# Patient Record
Sex: Male | Born: 1960 | Race: White | Hispanic: No | Marital: Married | Smoking: Former smoker
Health system: Southern US, Community
[De-identification: ages and names within clinical notes are randomized; demographics above are authoritative.]

## PROBLEM LIST (undated history)

## (undated) DIAGNOSIS — K219 Gastro-esophageal reflux disease without esophagitis: Secondary | ICD-10-CM

## (undated) DIAGNOSIS — R569 Unspecified convulsions: Secondary | ICD-10-CM

## (undated) DIAGNOSIS — E039 Hypothyroidism, unspecified: Secondary | ICD-10-CM

---

## 2014-01-18 ENCOUNTER — Emergency Department (HOSPITAL_COMMUNITY): Payer: Self-pay

## 2014-01-18 ENCOUNTER — Encounter (HOSPITAL_COMMUNITY): Payer: Self-pay | Admitting: Emergency Medicine

## 2014-01-18 ENCOUNTER — Emergency Department (HOSPITAL_COMMUNITY)
Admission: EM | Admit: 2014-01-18 | Discharge: 2014-01-19 | Disposition: A | Discharge status: Home / Self Care | Payer: Self-pay | Attending: Emergency Medicine | Admitting: Emergency Medicine

## 2014-01-18 DIAGNOSIS — Z79899 Other long term (current) drug therapy: Secondary | ICD-10-CM | POA: Insufficient documentation

## 2014-01-18 DIAGNOSIS — K5732 Diverticulitis of large intestine without perforation or abscess without bleeding: Secondary | ICD-10-CM | POA: Insufficient documentation

## 2014-01-18 DIAGNOSIS — F17200 Nicotine dependence, unspecified, uncomplicated: Secondary | ICD-10-CM | POA: Insufficient documentation

## 2014-01-18 DIAGNOSIS — K5792 Diverticulitis of intestine, part unspecified, without perforation or abscess without bleeding: Secondary | ICD-10-CM

## 2014-01-18 LAB — CBC WITH DIFFERENTIAL/PLATELET
BASOS ABS: 0 10*3/uL (ref 0.0–0.1)
BASOS PCT: 0 % (ref 0–1)
EOS ABS: 0 10*3/uL (ref 0.0–0.7)
EOS PCT: 0 % (ref 0–5)
HEMATOCRIT: 44.6 % (ref 39.0–52.0)
Hemoglobin: 16.3 g/dL (ref 13.0–17.0)
Lymphocytes Relative: 7 % — ABNORMAL LOW (ref 12–46)
Lymphs Abs: 1 10*3/uL (ref 0.7–4.0)
MCH: 33.4 pg (ref 26.0–34.0)
MCHC: 36.5 g/dL — ABNORMAL HIGH (ref 30.0–36.0)
MCV: 91.4 fL (ref 78.0–100.0)
MONO ABS: 1.3 10*3/uL — AB (ref 0.1–1.0)
Monocytes Relative: 8 % (ref 3–12)
NEUTROS ABS: 13.2 10*3/uL — AB (ref 1.7–7.7)
Neutrophils Relative %: 85 % — ABNORMAL HIGH (ref 43–77)
Platelets: 411 10*3/uL — ABNORMAL HIGH (ref 150–400)
RBC: 4.88 MIL/uL (ref 4.22–5.81)
RDW: 12.2 % (ref 11.5–15.5)
WBC: 15.6 10*3/uL — ABNORMAL HIGH (ref 4.0–10.5)

## 2014-01-18 LAB — URINALYSIS, ROUTINE W REFLEX MICROSCOPIC
Bilirubin Urine: NEGATIVE
Glucose, UA: NEGATIVE mg/dL
Hgb urine dipstick: NEGATIVE
KETONES UR: NEGATIVE mg/dL
LEUKOCYTES UA: NEGATIVE
Nitrite: NEGATIVE
Protein, ur: NEGATIVE mg/dL
Specific Gravity, Urine: 1.02 (ref 1.005–1.030)
Urobilinogen, UA: 0.2 mg/dL (ref 0.0–1.0)
pH: 6 (ref 5.0–8.0)

## 2014-01-18 LAB — BASIC METABOLIC PANEL
BUN: 12 mg/dL (ref 6–23)
CALCIUM: 9.3 mg/dL (ref 8.4–10.5)
CHLORIDE: 98 meq/L (ref 96–112)
CO2: 23 mEq/L (ref 19–32)
CREATININE: 0.71 mg/dL (ref 0.50–1.35)
GFR calc non Af Amer: >90 mL/min (ref >90)
Glucose, Bld: 109 mg/dL — ABNORMAL HIGH (ref 70–99)
Potassium: 3.5 mEq/L — ABNORMAL LOW (ref 3.7–5.3)
Sodium: 135 mEq/L — ABNORMAL LOW (ref 137–147)

## 2014-01-18 MED ORDER — IOHEXOL 300 MG/ML  SOLN
50.0000 mL | Freq: Once | INTRAMUSCULAR | Status: AC | PRN
  Administered 2014-01-18: 50 mL via ORAL

## 2014-01-18 MED ORDER — CIPROFLOXACIN HCL 750 MG PO TABS: 750.0000 mg | ORAL_TABLET | Freq: Two times a day (BID) | ORAL | Status: DC

## 2014-01-18 MED ORDER — ONDANSETRON HCL 4 MG/2ML IJ SOLN
4.0000 mg | Freq: Once | INTRAMUSCULAR | Status: AC
Start: 2014-01-18 — End: 2014-01-18
  Administered 2014-01-18: 4 mg via INTRAVENOUS
  Filled 2014-01-18: qty 2

## 2014-01-18 MED ORDER — PROMETHAZINE HCL 25 MG PO TABS: 25.0000 mg | ORAL_TABLET | Freq: Four times a day (QID) | ORAL | Status: DC | PRN

## 2014-01-18 MED ORDER — MORPHINE SULFATE 4 MG/ML IJ SOLN
4.0000 mg | Freq: Once | INTRAMUSCULAR | Status: AC
  Administered 2014-01-18: 4 mg via INTRAVENOUS
  Filled 2014-01-18: qty 1

## 2014-01-18 MED ORDER — SODIUM CHLORIDE 0.9 % IV BOLUS (SEPSIS)
1000.0000 mL | Freq: Once | INTRAVENOUS | Status: AC
  Administered 2014-01-18: 1000 mL via INTRAVENOUS

## 2014-01-18 MED ORDER — METRONIDAZOLE 500 MG PO TABS: 500.0000 mg | ORAL_TABLET | Freq: Four times a day (QID) | ORAL | Status: DC

## 2014-01-18 MED ORDER — KETOROLAC TROMETHAMINE 30 MG/ML IJ SOLN
30.0000 mg | Freq: Once | INTRAMUSCULAR | Status: AC
  Administered 2014-01-18: 30 mg via INTRAVENOUS
  Filled 2014-01-18: qty 1

## 2014-01-18 MED ORDER — CIPROFLOXACIN IN D5W 400 MG/200ML IV SOLN
400.0000 mg | Freq: Once | INTRAVENOUS | Status: AC
  Administered 2014-01-18: 400 mg via INTRAVENOUS
  Filled 2014-01-18: qty 200

## 2014-01-18 MED ORDER — IOHEXOL 300 MG/ML  SOLN
100.0000 mL | Freq: Once | INTRAMUSCULAR | Status: AC | PRN
  Administered 2014-01-18: 100 mL via INTRAVENOUS

## 2014-01-18 MED ORDER — METRONIDAZOLE IN NACL 5-0.79 MG/ML-% IV SOLN
500.0000 mg | Freq: Once | INTRAVENOUS | Status: AC
  Administered 2014-01-18: 500 mg via INTRAVENOUS
  Filled 2014-01-18: qty 100

## 2014-01-18 MED ORDER — OXYCODONE-ACETAMINOPHEN 5-325 MG PO TABS: 2.0000 | ORAL_TABLET | ORAL | Status: DC | PRN

## 2014-01-18 NOTE — ED Provider Notes (Signed)
CSN: [id number]     Arrival date & time 01/18/14  1649 History   This chart was scribed for Ian HutchingBrian Arneisha Kincannon, MD by Manuela Schwartzaylor Day, ED scribe. This patient was seen in room APA08/APA08 and the patient's care was started at 1649.  Chief Complaint  Patient presents with  . Abdominal Pain   The history is provided by the patient. No language interpreter was used.   HPI Comments: Smitty CordsRoger Allen Maynes is a 53 y.o. male who presents to the Emergency Department complaining of aching constant, bilateral lower abdominal pain left worse than right; radiating to his left flank; he also c/o lower back pain. His sister reports that today he looked ill and had pallor. He reports dysuria but denies hematuria. He reports normal BMs recently. He denies any hx of abdominal surgeries. Severity is moderate. No previous history of diverticulitis  Denies hx of DM or HTN He occasionally takes tylenol and vatiamin C.  He works as a Curatormechanic He is a smoker. Quit drinking alcohol about 2 months ago.   History reviewed. No pertinent past medical history. History reviewed. No pertinent past surgical history. No family history on file. History  Substance Use Topics  . Smoking status: Never Smoker   . Smokeless tobacco: Not on file  . Alcohol Use: Yes     Comment: last drank etoh 1 1/2 months ago.    Review of Systems  Constitutional: Negative for fever and chills.  Respiratory: Negative for cough and shortness of breath.   Cardiovascular: Negative for chest pain.  Gastrointestinal: Positive for abdominal pain. Negative for nausea, vomiting and diarrhea.  Musculoskeletal: Positive for back pain.  All other systems reviewed and are negative.   Allergies  Review of patient's allergies indicates no known allergies.  Home Medications   Current Outpatient Rx  Name  Route  Sig  Dispense  Refill  . acetaminophen (TYLENOL) 500 MG tablet   Oral   Take 1,000 mg by mouth every 6 (six) hours as needed for moderate pain.          . Ascorbic Acid (VITAMIN C PO)   Oral   Take 1 tablet by mouth daily.         . ciprofloxacin (CIPRO) 750 MG tablet   Oral   Take 1 tablet (750 mg total) by mouth 2 (two) times daily.   20 tablet   0   . metroNIDAZOLE (FLAGYL) 500 MG tablet   Oral   Take 1 tablet (500 mg total) by mouth 4 (four) times daily.   40 tablet   0   . oxyCODONE-acetaminophen (PERCOCET) 5-325 MG per tablet   Oral   Take 2 tablets by mouth every 4 (four) hours as needed.   20 tablet   0   . promethazine (PHENERGAN) 25 MG tablet   Oral   Take 1 tablet (25 mg total) by mouth every 6 (six) hours as needed for nausea or vomiting.   20 tablet   0    BP 121/48  Pulse 81  Temp(Src) 97.8 F (36.6 C) (Oral)  Resp 18  Ht 6' (1.829 m)  Wt 155 lb (70.308 kg)  BMI 21.02 kg/m2  SpO2 97%  Physical Exam  Nursing note and vitals reviewed. Constitutional: He is oriented to person, place, and time. He appears well-developed and well-nourished.  HENT:  Head: Normocephalic and atraumatic.  Eyes: Conjunctivae and EOM are normal. Pupils are equal, round, and reactive to light.  Neck: Normal range of motion. Neck  supple.  Cardiovascular: Normal rate, regular rhythm and normal heart sounds.   Pulmonary/Chest: Effort normal and breath sounds normal.  Abdominal: Soft. Bowel sounds are normal. He exhibits no distension. There is tenderness. There is no rebound and no guarding.  LLQ worse than RLQ tenderness to palpation     Musculoskeletal: Normal range of motion.  Neurological: He is alert and oriented to person, place, and time.  Skin: Skin is warm and dry.  Psychiatric: He has a normal mood and affect. His behavior is normal.   ED Course  Procedures (including critical care time) DIAGNOSTIC STUDIES: Oxygen Saturation is 97% on room air, normal by my interpretation.    COORDINATION OF CARE: At 630 PM Discussed treatment plan with patient which includes UA, blood work. Patient agrees.   Labs  Review Labs Reviewed  CBC WITH DIFFERENTIAL - Abnormal; Notable for the following:    WBC 15.6 (*)    MCHC 36.5 (*)    Platelets 411 (*)    Neutrophils Relative % 85 (*)    Neutro Abs 13.2 (*)    Lymphocytes Relative 7 (*)    Monocytes Absolute 1.3 (*)    All other components within normal limits  BASIC METABOLIC PANEL - Abnormal; Notable for the following:    Sodium 135 (*)    Potassium 3.5 (*)    Glucose, Bld 109 (*)    All other components within normal limits  URINALYSIS, ROUTINE W REFLEX MICROSCOPIC   Imaging Review No results found.  EKG Interpretation   None      MDM   Final diagnoses:  Diverticulitis    CT scan reveals a proximal sigmoid diverticulitis. No obstruction or abscess. Discussed both inpatient and outpatient options. Patient opted to try outpatient therapy. We'll discharge home with Cipro and Flagyl along with pain and nausea medication. He understands to return if worse.    I personally performed the services described in this documentation, which was scribed in my presence. The recorded information has been reviewed and is accurate.      Ian Hutching, MD 01/22/14 2111

## 2014-01-18 NOTE — ED Notes (Signed)
Pt c/o lower abd pain x 1 month.  Reports pain got worse today and has been nauseated.  Denies vomiting or diarrhea.  LBM was this am and was normal.  Pt says burns after urinating.

## 2014-01-18 NOTE — Discharge Instructions (Signed)
Diverticulitis Small pockets or "bubbles" can develop in the wall of the intestine. Diverticulitis is when those pockets become infected and inflamed. This causes stomach pain (usually on the left side). HOME CARE  Take all medicine as told by your doctor.  Try a clear liquid diet (broth, tea, or water) for as long as told by your doctor.  Keep all follow-up visits with your doctor.  You may be put on a low-fiber diet once you start feeling better. Here are foods that have low-fiber:  White breads, cereals, rice, and pasta.  Cooked fruits and vegetables or soft fresh fruits and vegetables without the skin.  Ground or well-cooked tender beef, ham, veal, lamb, pork, or poultry.  Eggs and seafood.  After you are doing well on the low-fiber diet, you may be put on a high-fiber diet. Here are ways to increase your fiber:  Choose whole-grain breads, cereals, pasta, and brown rice.  Choose fruits and vegetables with skin on. Do not overcook the vegetables.  Choose nuts, seeds, legumes, dried peas, beans, and lentils.  Look for food products that have more than 3 grams of fiber per serving on the food label. GET HELP RIGHT AWAY IF:  Your pain does not get better or gets worse.  You have trouble eating food.  You are not pooping (having bowel movements) like normal.  You have a temperature by mouth above 102 F (38.9 C), not controlled by medicine.  You keep throwing up (vomiting).  You have bloody or black, tarry poop (stools).  You are getting worse and not better. MAKE SURE YOU:   Understand these instructions.  Will watch your condition.  Will get help right away if you are not doing well or get worse. Document Released: 05/05/2008 Document Revised: 02/09/2012 Document Reviewed: 10/08/2009 [facility] Patient Information 2014 [employer] [place].   Clear liquids for next 24 hours. Then recommend nongreasy non-spicy foods for next 24 hours. Medication for pain and nausea. 2  antibiotics. One twice a day and one 4 times a day. Return you're getting worse in any way.

## 2014-11-28 ENCOUNTER — Emergency Department (HOSPITAL_COMMUNITY): Payer: Self-pay

## 2014-11-28 ENCOUNTER — Encounter (HOSPITAL_COMMUNITY)
Admission: EM | Disposition: A | Discharge status: Home / Self Care | Payer: Self-pay | Source: Home / Self Care | Attending: Emergency Medicine

## 2014-11-28 ENCOUNTER — Ambulatory Visit (HOSPITAL_COMMUNITY)
Admission: EM | Admit: 2014-11-28 | Discharge: 2014-11-29 | Disposition: A | Discharge status: Home / Self Care | Payer: Self-pay | Attending: Emergency Medicine | Admitting: Emergency Medicine

## 2014-11-28 ENCOUNTER — Emergency Department (HOSPITAL_COMMUNITY): Payer: Self-pay | Admitting: Certified Registered"

## 2014-11-28 ENCOUNTER — Encounter (HOSPITAL_COMMUNITY): Payer: Self-pay | Admitting: *Deleted

## 2014-11-28 DIAGNOSIS — Y929 Unspecified place or not applicable: Secondary | ICD-10-CM | POA: Insufficient documentation

## 2014-11-28 DIAGNOSIS — S62522B Displaced fracture of distal phalanx of left thumb, initial encounter for open fracture: Secondary | ICD-10-CM | POA: Insufficient documentation

## 2014-11-28 DIAGNOSIS — T1490XA Injury, unspecified, initial encounter: Secondary | ICD-10-CM

## 2014-11-28 DIAGNOSIS — S6702XA Crushing injury of left thumb, initial encounter: Secondary | ICD-10-CM | POA: Insufficient documentation

## 2014-11-28 DIAGNOSIS — F17210 Nicotine dependence, cigarettes, uncomplicated: Secondary | ICD-10-CM | POA: Insufficient documentation

## 2014-11-28 DIAGNOSIS — W3189XA Contact with other specified machinery, initial encounter: Secondary | ICD-10-CM | POA: Insufficient documentation

## 2014-11-28 DIAGNOSIS — S6992XA Unspecified injury of left wrist, hand and finger(s), initial encounter: Secondary | ICD-10-CM

## 2014-11-28 HISTORY — PX: I&D EXTREMITY: SHX5045

## 2014-11-28 LAB — CBC WITH DIFFERENTIAL/PLATELET
BASOS ABS: 0.1 10*3/uL (ref 0.0–0.1)
BASOS PCT: 1 % (ref 0–1)
Eosinophils Absolute: 0.2 10*3/uL (ref 0.0–0.7)
Eosinophils Relative: 1 % (ref 0–5)
HCT: 44.3 % (ref 39.0–52.0)
Hemoglobin: 15.8 g/dL (ref 13.0–17.0)
LYMPHS PCT: 19 % (ref 12–46)
Lymphs Abs: 2.4 10*3/uL (ref 0.7–4.0)
MCH: 31.5 pg (ref 26.0–34.0)
MCHC: 35.7 g/dL (ref 30.0–36.0)
MCV: 88.2 fL (ref 78.0–100.0)
Monocytes Absolute: 1.2 10*3/uL — ABNORMAL HIGH (ref 0.1–1.0)
Monocytes Relative: 10 % (ref 3–12)
NEUTROS ABS: 8.5 10*3/uL — AB (ref 1.7–7.7)
Neutrophils Relative %: 69 % (ref 43–77)
PLATELETS: 292 10*3/uL (ref 150–400)
RBC: 5.02 MIL/uL (ref 4.22–5.81)
RDW: 13.2 % (ref 11.5–15.5)
WBC: 12.3 10*3/uL — ABNORMAL HIGH (ref 4.0–10.5)

## 2014-11-28 LAB — BASIC METABOLIC PANEL
ANION GAP: 7 (ref 5–15)
BUN: 17 mg/dL (ref 6–23)
CO2: 22 mmol/L (ref 19–32)
Calcium: 9.5 mg/dL (ref 8.4–10.5)
Chloride: 109 mEq/L (ref 96–112)
Creatinine, Ser: 0.87 mg/dL (ref 0.50–1.35)
GFR calc non Af Amer: >90 mL/min (ref >90)
Glucose, Bld: 107 mg/dL — ABNORMAL HIGH (ref 70–99)
Potassium: 4.1 mmol/L (ref 3.5–5.1)
Sodium: 138 mmol/L (ref 135–145)

## 2014-11-28 SURGERY — IRRIGATION AND DEBRIDEMENT EXTREMITY
Anesthesia: General | Site: Thumb | Laterality: Left

## 2014-11-28 MED ORDER — MIDAZOLAM HCL 2 MG/2ML IJ SOLN
INTRAMUSCULAR | Status: AC
  Filled 2014-11-28: qty 2

## 2014-11-28 MED ORDER — CEFAZOLIN SODIUM 1-5 GM-% IV SOLN
1.0000 g | Freq: Once | INTRAVENOUS | Status: AC
  Administered 2014-11-28: 1 g via INTRAVENOUS
  Filled 2014-11-28: qty 50

## 2014-11-28 MED ORDER — CEFAZOLIN SODIUM-DEXTROSE 2-3 GM-% IV SOLR
INTRAVENOUS | Status: DC | PRN
  Administered 2014-11-28: 2 g via INTRAVENOUS

## 2014-11-28 MED ORDER — PROPOFOL 10 MG/ML IV BOLUS
INTRAVENOUS | Status: DC | PRN
  Administered 2014-11-28: 200 mg via INTRAVENOUS

## 2014-11-28 MED ORDER — HYDROMORPHONE HCL 1 MG/ML IJ SOLN
1.0000 mg | Freq: Once | INTRAMUSCULAR | Status: AC
  Administered 2014-11-28: 1 mg via INTRAVENOUS
  Filled 2014-11-28: qty 1

## 2014-11-28 MED ORDER — DIPHTH-ACELL PERTUSSIS-TETANUS 25-58-10 LF-MCG/0.5 IM SUSP
0.5000 mL | Freq: Once | INTRAMUSCULAR | Status: AC
  Administered 2014-11-28: 0.5 mL via INTRAMUSCULAR
  Filled 2014-11-28: qty 0.5

## 2014-11-28 MED ORDER — MIDAZOLAM HCL 5 MG/5ML IJ SOLN
INTRAMUSCULAR | Status: DC | PRN
  Administered 2014-11-28: 2 mg via INTRAVENOUS

## 2014-11-28 MED ORDER — SUFENTANIL CITRATE 50 MCG/ML IV SOLN
INTRAVENOUS | Status: AC
  Filled 2014-11-28: qty 1

## 2014-11-28 MED ORDER — BUPIVACAINE HCL (PF) 0.25 % IJ SOLN
INTRAMUSCULAR | Status: DC | PRN
  Administered 2014-11-28: 30 mL

## 2014-11-28 MED ORDER — 0.9 % SODIUM CHLORIDE (POUR BTL) OPTIME
TOPICAL | Status: DC | PRN
  Administered 2014-11-28: 1000 mL

## 2014-11-28 MED ORDER — ONDANSETRON HCL 4 MG/2ML IJ SOLN
4.0000 mg | Freq: Once | INTRAMUSCULAR | Status: AC
  Administered 2014-11-28: 4 mg via INTRAVENOUS
  Filled 2014-11-28: qty 2

## 2014-11-28 MED ORDER — LACTATED RINGERS IV SOLN
INTRAVENOUS | Status: DC | PRN
  Administered 2014-11-28: 23:00:00 via INTRAVENOUS

## 2014-11-28 MED ORDER — LIDOCAINE HCL (CARDIAC) 20 MG/ML IV SOLN
INTRAVENOUS | Status: DC | PRN
  Administered 2014-11-28: 100 mg via INTRAVENOUS

## 2014-11-28 MED ORDER — SUFENTANIL CITRATE 50 MCG/ML IV SOLN
INTRAVENOUS | Status: DC | PRN
  Administered 2014-11-28: 10 ug via INTRAVENOUS

## 2014-11-28 MED ORDER — BUPIVACAINE HCL (PF) 0.25 % IJ SOLN
INTRAMUSCULAR | Status: AC
  Filled 2014-11-28: qty 30

## 2014-11-28 SURGICAL SUPPLY — 55 items
BANDAGE COBAN STERILE 2 (GAUZE/BANDAGES/DRESSINGS) IMPLANT
BANDAGE ELASTIC 3 VELCRO ST LF (GAUZE/BANDAGES/DRESSINGS) ×3 IMPLANT
BANDAGE ELASTIC 4 VELCRO ST LF (GAUZE/BANDAGES/DRESSINGS) ×3 IMPLANT
BNDG COHESIVE 1X5 TAN STRL LF (GAUZE/BANDAGES/DRESSINGS) ×3 IMPLANT
BNDG CONFORM 2 STRL LF (GAUZE/BANDAGES/DRESSINGS) IMPLANT
BNDG ESMARK 4X9 LF (GAUZE/BANDAGES/DRESSINGS) ×3 IMPLANT
BNDG GAUZE ELAST 4 BULKY (GAUZE/BANDAGES/DRESSINGS) ×3 IMPLANT
CORDS BIPOLAR (ELECTRODE) ×6 IMPLANT
COVER SURGICAL LIGHT HANDLE (MISCELLANEOUS) ×3 IMPLANT
DECANTER SPIKE VIAL GLASS SM (MISCELLANEOUS) ×3 IMPLANT
DRAIN PENROSE 1/4X12 LTX STRL (WOUND CARE) IMPLANT
DRAPE SURG 17X11 SM STRL (DRAPES) ×6 IMPLANT
DRSG ADAPTIC 3X8 NADH LF (GAUZE/BANDAGES/DRESSINGS) IMPLANT
DRSG EMULSION OIL 3X3 NADH (GAUZE/BANDAGES/DRESSINGS) ×3 IMPLANT
DRSG PAD ABDOMINAL 8X10 ST (GAUZE/BANDAGES/DRESSINGS) ×6 IMPLANT
GAUZE SPONGE 4X4 12PLY STRL (GAUZE/BANDAGES/DRESSINGS) ×3 IMPLANT
GAUZE XEROFORM 1X8 LF (GAUZE/BANDAGES/DRESSINGS) ×3 IMPLANT
GLOVE BIO SURGEON STRL SZ7.5 (GLOVE) ×6 IMPLANT
GLOVE BIOGEL PI IND STRL 7.0 (GLOVE) ×1 IMPLANT
GLOVE BIOGEL PI IND STRL 8 (GLOVE) ×1 IMPLANT
GLOVE BIOGEL PI INDICATOR 7.0 (GLOVE) ×2
GLOVE BIOGEL PI INDICATOR 8 (GLOVE) ×2
GOWN STRL REIN XL XLG (GOWN DISPOSABLE) ×3 IMPLANT
GOWN STRL REUS W/TWL 2XL LVL3 (GOWN DISPOSABLE) ×3 IMPLANT
HANDPIECE INTERPULSE COAX TIP (DISPOSABLE)
KIT BASIN OR (CUSTOM PROCEDURE TRAY) ×3 IMPLANT
KIT ROOM TURNOVER OR (KITS) ×3 IMPLANT
LOOP VESSEL MAXI BLUE (MISCELLANEOUS) IMPLANT
LOOP VESSEL MINI RED (MISCELLANEOUS) IMPLANT
MANIFOLD NEPTUNE II (INSTRUMENTS) ×3 IMPLANT
NEEDLE HYPO 25GX1X1/2 BEV (NEEDLE) ×3 IMPLANT
NEEDLE HYPO 25X1 1.5 SAFETY (NEEDLE) IMPLANT
NS IRRIG 1000ML POUR BTL (IV SOLUTION) ×3 IMPLANT
PACK ORTHO EXTREMITY (CUSTOM PROCEDURE TRAY) ×3 IMPLANT
PAD ARMBOARD 7.5X6 YLW CONV (MISCELLANEOUS) ×6 IMPLANT
SCRUB BETADINE 4OZ XXX (MISCELLANEOUS) ×3 IMPLANT
SET HNDPC FAN SPRY TIP SCT (DISPOSABLE) IMPLANT
SOLUTION BETADINE 4OZ (MISCELLANEOUS) ×3 IMPLANT
SPLINT FINGER (SOFTGOODS) ×3 IMPLANT
SPONGE LAP 18X18 X RAY DECT (DISPOSABLE) ×3 IMPLANT
SPONGE LAP 4X18 X RAY DECT (DISPOSABLE) ×3 IMPLANT
SUCTION FRAZIER TIP 10 FR DISP (SUCTIONS) ×3 IMPLANT
SUT CHROMIC 6 0 PS 4 (SUTURE) ×3 IMPLANT
SUT ETHILON 4 0 PS 2 18 (SUTURE) ×3 IMPLANT
SUT MON AB 5-0 P3 18 (SUTURE) IMPLANT
SYR CONTROL 10ML LL (SYRINGE) ×3 IMPLANT
TOWEL OR 17X24 6PK STRL BLUE (TOWEL DISPOSABLE) ×3 IMPLANT
TOWEL OR 17X26 10 PK STRL BLUE (TOWEL DISPOSABLE) ×3 IMPLANT
TUBE ANAEROBIC SPECIMEN COL (MISCELLANEOUS) IMPLANT
TUBE CONNECTING 12'X1/4 (SUCTIONS) ×1
TUBE CONNECTING 12X1/4 (SUCTIONS) ×2 IMPLANT
TUBE FEEDING 5FR 15 INCH (TUBING) IMPLANT
UNDERPAD 30X30 INCONTINENT (UNDERPADS AND DIAPERS) ×3 IMPLANT
WATER STERILE IRR 1000ML POUR (IV SOLUTION) ×3 IMPLANT
YANKAUER SUCT BULB TIP NO VENT (SUCTIONS) ×3 IMPLANT

## 2014-11-28 NOTE — ED Notes (Signed)
Notified Dr. Merlyn LotKuzma that patient has arrived

## 2014-11-28 NOTE — ED Notes (Signed)
Dr Merlyn LotKuzma paged to pt room.

## 2014-11-28 NOTE — Anesthesia Preprocedure Evaluation (Addendum)
Anesthesia Evaluation  Patient identified by MRN, date of birth, ID band Patient awake    Reviewed: Allergy & Precautions, H&P , NPO status , Patient's Chart, lab work & pertinent test results  Airway Mallampati: II   Neck ROM: full    Dental   Pulmonary Current Smoker,          Cardiovascular negative cardio ROS      Neuro/Psych    GI/Hepatic   Endo/Other    Renal/GU      Musculoskeletal   Abdominal   Peds  Hematology   Anesthesia Other Findings   Reproductive/Obstetrics                           Anesthesia Physical Anesthesia Plan  ASA: II and emergent  Anesthesia Plan: General   Post-op Pain Management:    Induction: Intravenous  Airway Management Planned: LMA  Additional Equipment:   Intra-op Plan:   Post-operative Plan:   Informed Consent: I have reviewed the patients History and Physical, chart, labs and discussed the procedure including the risks, benefits and alternatives for the proposed anesthesia with the patient or authorized representative who has indicated his/her understanding and acceptance.     Plan Discussed with: CRNA, Anesthesiologist and Surgeon  Anesthesia Plan Comments:        Anesthesia Quick Evaluation

## 2014-11-28 NOTE — H&P (Signed)
Ian Wong is an 53 y.o. male.   Chief Complaint: left thumb crush HPI: 53 yo rhd male states his left thumb was crushed with a sledge hammer this afternoon when he was holding a material while his cousin used a Teacher, English as a foreign language to drive a pin.  Reports no previous injury to left thumb and no other injury at this time.  Seen at [facility] and transferred for further care.  History reviewed. No pertinent past medical history.  History reviewed. No pertinent past surgical history.  History reviewed. No pertinent family history. Social History:  reports that he has been smoking.  He does not have any smokeless tobacco history on file. He reports that he drinks alcohol. He reports that he does not use illicit drugs.  Allergies: No Known Allergies   (Not in a hospital admission)  Results for orders placed or performed during the hospital encounter of 11/28/14 (from the past 48 hour(s))  CBC with Differential     Status: Abnormal   Collection Time: 11/28/14  7:43 PM  Result Value Ref Range   WBC 12.3 (H) 4.0 - 10.5 K/uL   RBC 5.02 4.22 - 5.81 MIL/uL   Hemoglobin 15.8 13.0 - 17.0 g/dL   HCT 44.3 39.0 - 52.0 %   MCV 88.2 78.0 - 100.0 fL   MCH 31.5 26.0 - 34.0 pg   MCHC 35.7 30.0 - 36.0 g/dL   RDW 13.2 11.5 - 15.5 %   Platelets 292 150 - 400 K/uL   Neutrophils Relative % 69 43 - 77 %   Neutro Abs 8.5 (H) 1.7 - 7.7 K/uL   Lymphocytes Relative 19 12 - 46 %   Lymphs Abs 2.4 0.7 - 4.0 K/uL   Monocytes Relative 10 3 - 12 %   Monocytes Absolute 1.2 (H) 0.1 - 1.0 K/uL   Eosinophils Relative 1 0 - 5 %   Eosinophils Absolute 0.2 0.0 - 0.7 K/uL   Basophils Relative 1 0 - 1 %   Basophils Absolute 0.1 0.0 - 0.1 K/uL  Basic metabolic panel     Status: Abnormal   Collection Time: 11/28/14  7:43 PM  Result Value Ref Range   Sodium 138 135 - 145 mmol/L    Comment: Please note change in reference range.   Potassium 4.1 3.5 - 5.1 mmol/L    Comment: Please note change in reference range.   Chloride  109 96 - 112 mEq/L   CO2 22 19 - 32 mmol/L   Glucose, Bld 107 (H) 70 - 99 mg/dL   BUN 17 6 - 23 mg/dL   Creatinine, Ser 0.87 0.50 - 1.35 mg/dL   Calcium 9.5 8.4 - 10.5 mg/dL   GFR calc non Af Amer >90 >90 mL/min   GFR calc Af Amer >90 >90 mL/min    Comment: (NOTE) The eGFR has been calculated using the CKD EPI equation. This calculation has not been validated in all clinical situations. eGFR's persistently <90 mL/min signify possible Chronic Kidney Disease.    Anion gap 7 5 - 15    Dg Finger Thumb Left  11/28/2014   CLINICAL DATA:  Injury to left thumb.  EXAM: LEFT THUMB 2+V  COMPARISON:  None.  FINDINGS: Comminuted, intra-articular fracture involves the distal phalanx. Fracture fragments appear displaced along the large laceration in originating at the tip of the thumb. Fracture fragments for foreign bodies identified within the volar aspect of the thumb.  IMPRESSION: 1. Comminuted fracture deformity involves the distal phalanx.  Electronically Signed   By: Kerby Moors M.D.   On: 11/28/2014 16:45     A comprehensive review of systems was negative.  Blood pressure 149/70, pulse 94, temperature 98.4 F (36.9 C), temperature source Oral, resp. rate 14, height 5' 11"  (1.803 m), weight 81.647 kg (180 lb), SpO2 95 %.  General appearance: alert, cooperative and appears stated age Head: Normocephalic, without obvious abnormality, atraumatic Neck: supple, symmetrical, trachea midline Resp: clear to auscultation bilaterally Cardio: regular rate and rhythm GI: non tender Extremities: intact sensation and capillary refill all digits.  +epl/fpl/io.  left thumb with nail injury and longitudinal nail bed split.  wound on pad of digit as well.  able to flex and extend ip joint. Pulses: 2+ and symmetric Skin: Skin color, texture, turgor normal. No rashes or lesions Neurologic: Grossly normal Incision/Wound: As above  Assessment/Plan Left thumb crush injury.  Recommend OR for irrigation  and debridement and repair of wounds with possible pinning of fracture.  Non operative and operative treatment options were discussed with the patient and patient wishes to proceed with operative treatment. Risks, benefits, and alternatives of surgery were discussed and the patient agrees with the plan of care.   Cheryn Lundquist R 11/28/2014, 10:58 PM

## 2014-11-28 NOTE — ED Notes (Signed)
Dr Kuzma at bedside. 

## 2014-11-28 NOTE — Anesthesia Procedure Notes (Signed)
Procedure Name: LMA Insertion Date/Time: 11/28/2014 11:43 PM Performed by: Melina SchoolsBANKS, Siya Flurry J Pre-anesthesia Checklist: Patient identified, Emergency Drugs available, Suction available, Patient being monitored and Timeout performed Patient Re-evaluated:Patient Re-evaluated prior to inductionOxygen Delivery Method: Circle system utilized Preoxygenation: Pre-oxygenation with 100% oxygen Intubation Type: IV induction Ventilation: Mask ventilation without difficulty LMA: LMA inserted LMA Size: 5.0 Placement Confirmation: positive ETCO2 and breath sounds checked- equal and bilateral Tube secured with: Tape Dental Injury: Teeth and Oropharynx as per pre-operative assessment

## 2014-11-28 NOTE — ED Notes (Signed)
[facility]  Consulted to Dr. Merlyn LotKuzma. Report called to Consulting civil engineerCharge RN, Minerva AreolaEric. Patient out of department at this time with [facility]

## 2014-11-28 NOTE — ED Provider Notes (Signed)
CSN: [id number]     Arrival date & time 11/28/14  1556 History  This chart was scribed for Benny LennertJoseph L Rain Wilhide, MD by [place]il[place] Peace, ED Scribe. The patient was seen in APA02/APA02. The patient's care was started at 7:38 PM.    Chief Complaint  Patient presents with  . Hand Injury      Patient is a 53 y.o. male presenting with hand injury. The history is provided by the patient (pt hurt his left thumb with a hammer). No language interpreter was used.  Hand Injury Upper extremity pain location: left thumb. Pain details:    Quality:  Aching   Radiates to:  Does not radiate   Severity:  Moderate   Onset quality:  Sudden   Timing:  Constant   Progression:  Unchanged Chronicity:  New Associated symptoms: no back pain and no fatigue    HPI Comments: Ian Wong is a 53 y.o. male who presents to the Emergency Department complaining of hand injury that occurred earlier today while pt was working on car. Pt states that he was holding something down when his cousin was using a sledge hammer to nail down some pins in object he was holding, and accidentally got his left thumb hit by hammer. Laceration is specifically to distal aspect of left thumb and went through pt's nail. Pt is current everyday smoker.    History reviewed. No pertinent past medical history. History reviewed. No pertinent past surgical history. History reviewed. No pertinent family history. History  Substance Use Topics  . Smoking status: Current Every Day Smoker  . Smokeless tobacco: Not on file  . Alcohol Use: Yes     Comment: last drank etoh 1 1/2 months ago.    Review of Systems  Constitutional: Negative for appetite change and fatigue.  HENT: Negative for congestion, ear discharge and sinus pressure.   Eyes: Negative for discharge.  Respiratory: Negative for cough.   Cardiovascular: Negative for chest pain.  Gastrointestinal: Negative for abdominal pain and diarrhea.  Genitourinary: Negative for frequency and  hematuria.  Musculoskeletal: Negative for back pain.       Thumb injury  Skin: Positive for wound. Negative for rash.  Neurological: Negative for seizures and headaches.  Psychiatric/Behavioral: Negative for hallucinations.      Allergies  Review of patient's allergies indicates no known allergies.  Home Medications   Prior to Admission medications   Medication Sig Start Date End Date Taking? Authorizing Provider  acetaminophen (TYLENOL) 500 MG tablet Take 1,000 mg by mouth every 6 (six) hours as needed for moderate pain.   Yes Historical Provider, MD  ibuprofen (ADVIL,MOTRIN) 200 MG tablet Take 200 mg by mouth every 6 (six) hours as needed for mild pain or moderate pain.   Yes Historical Provider, MD  Multiple Vitamin (MULTIVITAMIN WITH MINERALS) TABS tablet Take 1 tablet by mouth daily.   Yes Historical Provider, MD  ciprofloxacin (CIPRO) 750 MG tablet Take 1 tablet (750 mg total) by mouth 2 (two) times daily. Patient not taking: Reported on 11/28/2014 01/18/14   Donnetta HutchingBrian Cook, MD  metroNIDAZOLE (FLAGYL) 500 MG tablet Take 1 tablet (500 mg total) by mouth 4 (four) times daily. Patient not taking: Reported on 11/28/2014 01/18/14   Donnetta HutchingBrian Cook, MD  oxyCODONE-acetaminophen (PERCOCET) 5-325 MG per tablet Take 2 tablets by mouth every 4 (four) hours as needed. Patient not taking: Reported on 11/28/2014 01/18/14   Donnetta HutchingBrian Cook, MD  promethazine (PHENERGAN) 25 MG tablet Take 1 tablet (25 mg total) by  mouth every 6 (six) hours as needed for nausea or vomiting. Patient not taking: Reported on 11/28/2014 01/18/14   Donnetta HutchingBrian Cook, MD   BP 129/54 mmHg  Pulse 78  Temp(Src) 98.1 F (36.7 C) (Oral)  Resp 18  Ht 5\' 11"  (1.803 m)  Wt 180 lb (81.647 kg)  BMI 25.12 kg/m2 Physical Exam  Constitutional: He is oriented to person, place, and time. He appears well-developed.  HENT:  Head: Normocephalic.  Eyes: Conjunctivae are normal.  Neck: No tracheal deviation present.  Cardiovascular:  No murmur  heard. Musculoskeletal: Normal range of motion.  3 cm laceration to left thumb. Nail is coming off. Able to extend distal thumb. Neuro vas nl  Neurological: He is oriented to person, place, and time.  Skin: Skin is warm.  Psychiatric: He has a normal mood and affect.    ED Course  Procedures (including critical care time) Labs Review Labs Reviewed - No data to display  Imaging Review Dg Finger Thumb Left  11/28/2014   CLINICAL DATA:  Injury to left thumb.  EXAM: LEFT THUMB 2+V  COMPARISON:  None.  FINDINGS: Comminuted, intra-articular fracture involves the distal phalanx. Fracture fragments appear displaced along the large laceration in originating at the tip of the thumb. Fracture fragments for foreign bodies identified within the volar aspect of the thumb.  IMPRESSION: 1. Comminuted fracture deformity involves the distal phalanx.   Electronically Signed   By: Signa Kellaylor  Stroud M.D.   On: 11/28/2014 16:45     EKG Interpretation None     Medications - No data to display  7:41 PM- Treatment plan was discussed with patient who verbalizes understanding and agrees.   MDM   Final diagnoses:  Injury    Pt with open fx left thumb.   Dr. Merlyn [facility]kuzma to [facility]  I personally performed the services described in this documentation, which was scribed in my presence. The recorded information has been reviewed and is accurate.   Benny LennertJoseph L Jatavian Calica, MD 11/28/14 2108

## 2014-11-28 NOTE — ED Notes (Addendum)
Struck lt thumb with Radio broadcast assistantsledge hammer, lac  Thru nail   Also injury to index finger with grinder

## 2014-11-29 ENCOUNTER — Encounter (HOSPITAL_COMMUNITY): Payer: Self-pay | Admitting: Orthopedic Surgery

## 2014-11-29 MED ORDER — SODIUM CHLORIDE 0.9 % IJ SOLN
INTRAMUSCULAR | Status: AC
  Filled 2014-11-29: qty 10

## 2014-11-29 MED ORDER — ONDANSETRON HCL 4 MG/2ML IJ SOLN: 4.0000 mg | Freq: Four times a day (QID) | INTRAMUSCULAR | Status: DC | PRN

## 2014-11-29 MED ORDER — OXYCODONE-ACETAMINOPHEN 5-325 MG PO TABS: ORAL_TABLET | ORAL | Status: DC

## 2014-11-29 MED ORDER — ONDANSETRON HCL 4 MG/2ML IJ SOLN
INTRAMUSCULAR | Status: DC | PRN
  Administered 2014-11-29: 4 mg via INTRAVENOUS

## 2014-11-29 MED ORDER — DEXAMETHASONE SODIUM PHOSPHATE 4 MG/ML IJ SOLN
INTRAMUSCULAR | Status: DC | PRN
  Administered 2014-11-29: 4 mg via INTRAVENOUS

## 2014-11-29 MED ORDER — HYDROMORPHONE HCL 1 MG/ML IJ SOLN: 0.2500 mg | INTRAMUSCULAR | Status: DC | PRN

## 2014-11-29 MED ORDER — SULFAMETHOXAZOLE-TRIMETHOPRIM 800-160 MG PO TABS: 1.0000 | ORAL_TABLET | Freq: Two times a day (BID) | ORAL | Status: DC

## 2014-11-29 MED ORDER — DEXAMETHASONE SODIUM PHOSPHATE 4 MG/ML IJ SOLN
INTRAMUSCULAR | Status: AC
  Filled 2014-11-29: qty 1

## 2014-11-29 MED ORDER — ONDANSETRON HCL 4 MG/2ML IJ SOLN
INTRAMUSCULAR | Status: AC
  Filled 2014-11-29: qty 2

## 2014-11-29 MED ORDER — LIDOCAINE HCL (CARDIAC) 20 MG/ML IV SOLN
INTRAVENOUS | Status: AC
  Filled 2014-11-29: qty 5

## 2014-11-29 MED ORDER — CEFAZOLIN SODIUM-DEXTROSE 2-3 GM-% IV SOLR
INTRAVENOUS | Status: AC
  Filled 2014-11-29: qty 50

## 2014-11-29 NOTE — Transfer of Care (Signed)
Immediate Anesthesia Transfer of Care Note  Patient: Ian CordsRoger Allen Wong  Procedure(s) Performed: Procedure(s): IRRIGATION AND DEBRIDEMENT;  REPAIR OF CRUSH INJURY (Left)  Patient Location: PACU  Anesthesia Type:General  Level of Consciousness: awake, alert , oriented and patient cooperative  Airway & Oxygen Therapy: Patient Spontanous Breathing and Patient connected to nasal cannula oxygen  Post-op Assessment: Report given to PACU RN, Post -op Vital signs reviewed and stable and Patient moving all extremities X 4  Post vital signs: Reviewed and stable  Complications: No apparent anesthesia complications
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
## 2014-11-29 NOTE — Discharge Instructions (Signed)

## 2014-11-29 NOTE — Op Note (Signed)
[id number] 

## 2014-11-29 NOTE — Brief Op Note (Signed)
11/28/2014 - 11/29/2014  12:25 AM  PATIENT:  Ian Wong  53 y.o. male  PRE-OPERATIVE DIAGNOSIS:  LEFT THUMB CRUSH INJURY  POST-OPERATIVE DIAGNOSIS:  LEFT THUMB CRUSH INJURY  PROCEDURE:  Procedure(s): IRRIGATION AND DEBRIDEMENT;  REPAIR OF CRUSH INJURY (Left)  SURGEON:  Surgeon(s) and Role:    * Betha LoaKevin Jawuan Robb, MD - Primary  PHYSICIAN ASSISTANT:   ASSISTANTS: none   ANESTHESIA:   general  EBL:  Total I/O In: -  Out: 850 [Urine:850]  BLOOD ADMINISTERED:none  DRAINS: none   LOCAL MEDICATIONS USED:  MARCAINE     SPECIMEN:  No Specimen  DISPOSITION OF SPECIMEN:  N/A  COUNTS:  YES  TOURNIQUET:   Total Tourniquet Time Documented: Upper Arm (Left) - 27 minutes Total: Upper Arm (Left) - 27 minutes   DICTATION: .Other Dictation: Dictation Number [id number]  PLAN OF CARE: Discharge to home after PACU  PATIENT DISPOSITION:  PACU - hemodynamically stable.

## 2014-12-04 NOTE — Anesthesia Postprocedure Evaluation (Signed)
Anesthesia Post Note  Patient: Ian Wong  Procedure(s) Performed: Procedure(s) (LRB): IRRIGATION AND DEBRIDEMENT;  REPAIR OF CRUSH INJURY (Left)  Anesthesia type: General  Patient location: PACU  Post pain: Pain level controlled and Adequate analgesia  Post assessment: Post-op Vital signs reviewed, Patient's Cardiovascular Status Stable, Respiratory Function Stable, Patent Airway and Pain level controlled  Last Vitals:  Filed Vitals:   11/29/14 0100  BP: 135/61  Pulse: 78  Temp: 36.4 C  Resp: 14    Post vital signs: Reviewed and stable  Level of consciousness: awake, alert  and oriented  Complications: No apparent anesthesia complications

## 2016-12-29 ENCOUNTER — Emergency Department (HOSPITAL_COMMUNITY): Payer: Self-pay

## 2016-12-29 ENCOUNTER — Encounter (HOSPITAL_COMMUNITY): Payer: Self-pay | Admitting: Emergency Medicine

## 2016-12-29 ENCOUNTER — Inpatient Hospital Stay (HOSPITAL_COMMUNITY)
Admission: EM | Admit: 2016-12-29 | Discharge: 2017-01-15 | DRG: 271 | Disposition: A | Discharge status: Home / Self Care | Payer: Self-pay | Attending: Vascular Surgery | Admitting: Vascular Surgery

## 2016-12-29 DIAGNOSIS — S81802A Unspecified open wound, left lower leg, initial encounter: Secondary | ICD-10-CM

## 2016-12-29 DIAGNOSIS — K429 Umbilical hernia without obstruction or gangrene: Secondary | ICD-10-CM | POA: Diagnosis present

## 2016-12-29 DIAGNOSIS — I998 Other disorder of circulatory system: Secondary | ICD-10-CM

## 2016-12-29 DIAGNOSIS — E871 Hypo-osmolality and hyponatremia: Secondary | ICD-10-CM | POA: Diagnosis present

## 2016-12-29 DIAGNOSIS — R262 Difficulty in walking, not elsewhere classified: Secondary | ICD-10-CM

## 2016-12-29 DIAGNOSIS — I739 Peripheral vascular disease, unspecified: Secondary | ICD-10-CM | POA: Diagnosis present

## 2016-12-29 DIAGNOSIS — E039 Hypothyroidism, unspecified: Secondary | ICD-10-CM | POA: Diagnosis present

## 2016-12-29 DIAGNOSIS — Z95828 Presence of other vascular implants and grafts: Secondary | ICD-10-CM

## 2016-12-29 DIAGNOSIS — L97529 Non-pressure chronic ulcer of other part of left foot with unspecified severity: Secondary | ICD-10-CM | POA: Diagnosis present

## 2016-12-29 DIAGNOSIS — D62 Acute posthemorrhagic anemia: Secondary | ICD-10-CM | POA: Diagnosis not present

## 2016-12-29 DIAGNOSIS — L0390 Cellulitis, unspecified: Secondary | ICD-10-CM | POA: Diagnosis present

## 2016-12-29 DIAGNOSIS — L97229 Non-pressure chronic ulcer of left calf with unspecified severity: Secondary | ICD-10-CM | POA: Diagnosis present

## 2016-12-29 DIAGNOSIS — Z8679 Personal history of other diseases of the circulatory system: Secondary | ICD-10-CM

## 2016-12-29 DIAGNOSIS — I70229 Atherosclerosis of native arteries of extremities with rest pain, unspecified extremity: Secondary | ICD-10-CM | POA: Diagnosis present

## 2016-12-29 DIAGNOSIS — K567 Ileus, unspecified: Secondary | ICD-10-CM | POA: Diagnosis not present

## 2016-12-29 DIAGNOSIS — I7409 Other arterial embolism and thrombosis of abdominal aorta: Principal | ICD-10-CM | POA: Diagnosis present

## 2016-12-29 DIAGNOSIS — Z79899 Other long term (current) drug therapy: Secondary | ICD-10-CM

## 2016-12-29 DIAGNOSIS — N529 Male erectile dysfunction, unspecified: Secondary | ICD-10-CM | POA: Diagnosis present

## 2016-12-29 DIAGNOSIS — Z7982 Long term (current) use of aspirin: Secondary | ICD-10-CM

## 2016-12-29 DIAGNOSIS — E785 Hyperlipidemia, unspecified: Secondary | ICD-10-CM | POA: Diagnosis present

## 2016-12-29 DIAGNOSIS — F17210 Nicotine dependence, cigarettes, uncomplicated: Secondary | ICD-10-CM | POA: Diagnosis present

## 2016-12-29 DIAGNOSIS — R739 Hyperglycemia, unspecified: Secondary | ICD-10-CM | POA: Diagnosis present

## 2016-12-29 DIAGNOSIS — I70209 Unspecified atherosclerosis of native arteries of extremities, unspecified extremity: Secondary | ICD-10-CM | POA: Diagnosis present

## 2016-12-29 DIAGNOSIS — L03116 Cellulitis of left lower limb: Secondary | ICD-10-CM | POA: Diagnosis present

## 2016-12-29 LAB — CBC WITH DIFFERENTIAL/PLATELET
Basophils Absolute: 0 10*3/uL (ref 0.0–0.1)
Basophils Relative: 0 %
EOS PCT: 1 %
Eosinophils Absolute: 0.1 10*3/uL (ref 0.0–0.7)
HCT: 43.4 % (ref 39.0–52.0)
Hemoglobin: 15.3 g/dL (ref 13.0–17.0)
LYMPHS PCT: 19 %
Lymphs Abs: 1.9 10*3/uL (ref 0.7–4.0)
MCH: 31.6 pg (ref 26.0–34.0)
MCHC: 35.3 g/dL (ref 30.0–36.0)
MCV: 89.7 fL (ref 78.0–100.0)
MONOS PCT: 15 %
Monocytes Absolute: 1.5 10*3/uL — ABNORMAL HIGH (ref 0.1–1.0)
Neutro Abs: 6.6 10*3/uL (ref 1.7–7.7)
Neutrophils Relative %: 65 %
Platelets: 311 10*3/uL (ref 150–400)
RBC: 4.84 MIL/uL (ref 4.22–5.81)
RDW: 12.8 % (ref 11.5–15.5)
WBC: 10.2 10*3/uL (ref 4.0–10.5)

## 2016-12-29 LAB — BASIC METABOLIC PANEL
ANION GAP: 9 (ref 5–15)
BUN: 8 mg/dL (ref 6–20)
CALCIUM: 9 mg/dL (ref 8.9–10.3)
CO2: 25 mmol/L (ref 22–32)
CREATININE: 0.96 mg/dL (ref 0.61–1.24)
Chloride: 99 mmol/L — ABNORMAL LOW (ref 101–111)
GFR calc Af Amer: >60 mL/min (ref >60)
GFR calc non Af Amer: >60 mL/min (ref >60)
GLUCOSE: 101 mg/dL — AB (ref 65–99)
Potassium: 3.7 mmol/L (ref 3.5–5.1)
Sodium: 133 mmol/L — ABNORMAL LOW (ref 135–145)

## 2016-12-29 LAB — I-STAT CG4 LACTIC ACID, ED: Lactic Acid, Venous: 2.15 mmol/L (ref 0.5–1.9)

## 2016-12-29 MED ORDER — SODIUM CHLORIDE 0.9 % IV BOLUS (SEPSIS)
1000.0000 mL | Freq: Once | INTRAVENOUS | Status: AC
  Administered 2016-12-29: 1000 mL via INTRAVENOUS

## 2016-12-29 MED ORDER — MORPHINE SULFATE (PF) 4 MG/ML IV SOLN
4.0000 mg | Freq: Once | INTRAVENOUS | Status: AC
Start: 2016-12-29 — End: 2016-12-29
  Administered 2016-12-29: 4 mg via INTRAVENOUS
  Filled 2016-12-29: qty 1

## 2016-12-29 MED ORDER — VANCOMYCIN HCL IN DEXTROSE 1-5 GM/200ML-% IV SOLN
1000.0000 mg | Freq: Once | INTRAVENOUS | Status: AC
  Administered 2016-12-29: 1000 mg via INTRAVENOUS
  Filled 2016-12-29: qty 200

## 2016-12-29 NOTE — ED Provider Notes (Signed)
[facility]-EMERGENCY DEPT Provider Note   CSN: [id number] Arrival date & time: 12/29/16  1459     History   Chief Complaint Chief Complaint  Patient presents with  . Wound Infection    HPI Ian Wong is a 56 y.o. male.  Pt presents to the ED today with a sore on the left foot that is not getting better.  The pt said that he started with an abscess to his left leg.  He was given an abx (doxy?) that he took for 1 week.  He went back to urgent care when he developed a sore to his left foot and the sore to his left leg was not improving.  He was put on augmentin twice for that.  Pt said sx are getting worse.        History reviewed. No pertinent past medical history.  Patient Active Problem List   Diagnosis Date Noted  . Cellulitis 12/29/2016    Past Surgical History:  Procedure Laterality Date  . I&D EXTREMITY Left 11/28/2014   Procedure: IRRIGATION AND DEBRIDEMENT;  REPAIR OF CRUSH INJURY;  Surgeon: Betha LoaKevin Kuzma, MD;  Location: [facility] OR;  Service: Orthopedics;  Laterality: Left;       Home Medications    Prior to Admission medications   Medication Sig Start Date End Date Taking? Authorizing Provider  aspirin EC 81 MG tablet Take 81 mg by mouth daily.   Yes Historical Provider, MD  levothyroxine (SYNTHROID, LEVOTHROID) 50 MCG tablet Take 50 mcg by mouth daily. 11/20/16  Yes Historical Provider, MD  naproxen sodium (ALEVE) 220 MG tablet Take 220-440 mg by mouth daily as needed (for pain).   Yes Historical Provider, MD    Family History History reviewed. No pertinent family history.  Social History Social History  Substance Use Topics  . Smoking status: Current Every Day Smoker    Packs/day: 0.50  . Smokeless tobacco: Never Used  . Alcohol use Yes     Comment: last drank etoh 1 1/2 months ago.     Allergies   Patient has no known allergies.   Review of Systems Review of Systems  Skin: Positive for wound.  All other systems reviewed and are  negative.    Physical Exam Updated Vital Signs BP 134/70 (BP Location: Left Arm)   Pulse 94   Temp 98.1 F (36.7 C) (Oral)   Resp 20   Ht 5\' 11"  (1.803 m)   Wt 194 lb (88 kg)   SpO2 97%   BMI 27.06 kg/m   Physical Exam  Constitutional: He is oriented to person, place, and time. He appears well-developed and well-nourished.  HENT:  Head: Normocephalic and atraumatic.  Right Ear: External ear normal.  Left Ear: External ear normal.  Nose: Nose normal.  Mouth/Throat: Oropharynx is clear and moist.  Eyes: Conjunctivae and EOM are normal. Pupils are equal, round, and reactive to light.  Neck: Normal range of motion. Neck supple.  Cardiovascular: Regular rhythm, normal heart sounds and intact distal pulses.  Tachycardia present.   Pulmonary/Chest: Effort normal and breath sounds normal.  Abdominal: Soft. Bowel sounds are normal.  Musculoskeletal: Normal range of motion.  Neurological: He is alert and oriented to person, place, and time.  Skin: Skin is warm.     Psychiatric: He has a normal mood and affect. His behavior is normal. Judgment and thought content normal.  Nursing note and vitals reviewed.    ED Treatments / Results  Labs (all labs ordered are listed, but  only abnormal results are displayed) Labs Reviewed  CBC WITH DIFFERENTIAL/PLATELET - Abnormal; Notable for the following:       Result Value   Monocytes Absolute 1.5 (*)    All other components within normal limits  BASIC METABOLIC PANEL - Abnormal; Notable for the following:    Sodium 133 (*)    Chloride 99 (*)    Glucose, Bld 101 (*)    All other components within normal limits  I-STAT CG4 LACTIC ACID, ED - Abnormal; Notable for the following:    Lactic Acid, Venous 2.15 (*)    All other components within normal limits  CULTURE, BLOOD (ROUTINE X 2)  CULTURE, BLOOD (ROUTINE X 2)    EKG  EKG Interpretation None       Radiology Dg Foot Complete Left  Result Date: 12/29/2016 CLINICAL DATA:   Left foot wound, pain, swelling and redness. EXAM: LEFT FOOT - COMPLETE 3+ VIEW COMPARISON:  None available FINDINGS: Left first MTP joint demonstrates degenerative osteoarthritis with associated hallux valgus deformity, joint space loss, sclerosis and bony spurring. No acute osseous finding or fracture. Dorsal foot soft tissue swelling on the lateral view. No visualized radiopaque or metallic foreign body. No plain radiographic evidence of osseous destruction or bone loss. 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 Electronically Signed   By: Judie Petit.  Shick M.D.   On: 12/29/2016 20:05    Procedures Procedures (including critical care time)  Medications Ordered in ED Medications  vancomycin (VANCOCIN) IVPB 1000 mg/200 mL premix (0 mg Intravenous Stopped 12/29/16 2110)  sodium chloride 0.9 % bolus 1,000 mL (0 mLs Intravenous Stopped 12/29/16 2110)  morphine 4 MG/ML injection 4 mg (4 mg Intravenous Given 12/29/16 2116)     Initial Impression / Assessment and Plan / ED Course  I have reviewed the triage vital signs and the nursing notes.  Pertinent labs & imaging results that were available during my care of the patient were reviewed by me and considered in my medical decision making (see chart for details).     Pt has been on 3 rounds of antibiotics without improvement with his cellulitis.  Pt d/w Dr. Onalee Hua ([facility]) for admission.  Final Clinical Impressions(s) / ED Diagnoses   Final diagnoses:  Cellulitis of left lower extremity    New Prescriptions New Prescriptions   No medications on file     Jacalyn Lefevre, MD 12/29/16 2143

## 2016-12-29 NOTE — H&P (Signed)
History and Physical    Ian Wong ZOX:[id number] DOB: 27-Jul-1961 DOA: 12/29/2016  PCP: No PCP Per Patient  Patient coming from: home  Chief Complaint:  Leg red  HPI: Ian Wong is a 56 y.o. male with medical history significant of one month of fighting infection in his left leg, has been on 3 rounds of antibiotics.  Pt says it all started with a "spider bite" to his calf area and has a wound there and now has a wound to the foot, there is redness from foot going up calf.  It is more swollen and red.  He denies any fevers.  He says he has taken the antibiotics that have been given to him.  No h/o vascular issues or diabetes.  Pt referred for admission for cellulitis of leg despite several rounds of oral antibiotics.  Review of Systems: As per HPI otherwise 10 point review of systems negative.   History reviewed. No pertinent past medical history.  Past Surgical History:  Procedure Laterality Date  . I&D EXTREMITY Left 11/28/2014   Procedure: IRRIGATION AND DEBRIDEMENT;  REPAIR OF CRUSH INJURY;  Surgeon: Betha Loa, MD;  Location: [facility] OR;  Service: Orthopedics;  Laterality: Left;     reports that he has been smoking.  He has been smoking about 0.50 packs per day. He has never used smokeless tobacco. He reports that he drinks alcohol. He reports that he does not use drugs.  No Known Allergies  History reviewed. No pertinent family history. no premature CAD  Prior to Admission medications   Medication Sig Start Date End Date Taking? Authorizing Provider  aspirin EC 81 MG tablet Take 81 mg by mouth daily.   Yes Historical Provider, MD  levothyroxine (SYNTHROID, LEVOTHROID) 50 MCG tablet Take 50 mcg by mouth daily. 11/20/16  Yes Historical Provider, MD  naproxen sodium (ALEVE) 220 MG tablet Take 220-440 mg by mouth daily as needed (for pain).   Yes Historical Provider, MD    Physical Exam: Vitals:   12/29/16 1512 12/29/16 1513 12/29/16 2013  BP:  142/65 134/70  Pulse:   103 94  Resp:  15 20  Temp:  98.1 F (36.7 C)   TempSrc:  Oral   SpO2:  99% 97%  Weight: 88 kg (194 lb)    Height: 5\' 11"  (1.803 m)      Constitutional: NAD, calm, comfortable Vitals:   12/29/16 1512 12/29/16 1513 12/29/16 2013  BP:  142/65 134/70  Pulse:  103 94  Resp:  15 20  Temp:  98.1 F (36.7 C)   TempSrc:  Oral   SpO2:  99% 97%  Weight: 88 kg (194 lb)    Height: 5\' 11"  (1.803 m)     Eyes: PERRL, lids and conjunctivae normal ENMT: Mucous membranes are moist. Posterior pharynx clear of any exudate or lesions.Normal dentition.  Neck: normal, supple, no masses, no thyromegaly Respiratory: clear to auscultation bilaterally, no wheezing, no crackles. Normal respiratory effort. No accessory muscle use.  Cardiovascular: Regular rate and rhythm, no murmurs / rubs / gallops. No extremity edema. 2+ pedal pulses. No carotid bruits.  Abdomen: no tenderness, no masses palpated. No hepatosplenomegaly. Bowel sounds positive.  Musculoskeletal: no clubbing / cyanosis. No joint deformity upper and lower extremities. Good ROM, no contractures. Normal muscle tone.  Skin:  Cellulitis to left foot and up past ankle to calf, open round wound to calf and foot No induration Neurologic: CN 2-12 grossly intact. Sensation intact, DTR normal. Strength 5/5 in  all 4.  Psychiatric: Normal judgment and insight. Alert and oriented x 3. Normal mood.    Labs on Admission: I have personally reviewed following labs and imaging studies  CBC:  Recent Labs Lab 12/29/16 1543  WBC 10.2  NEUTROABS 6.6  HGB 15.3  HCT 43.4  MCV 89.7  PLT 311   Basic Metabolic Panel:  Recent Labs Lab 12/29/16 1543  NA 133*  K 3.7  CL 99*  CO2 25  GLUCOSE 101*  BUN 8  CREATININE 0.96  CALCIUM 9.0   GFR: Estimated Creatinine Clearance: 93.7 mL/min (by C-G formula based on SCr of 0.96 mg/dL).    Recent Results (from the past 240 hour(s))  Blood culture (routine x 2)     Status: None (Preliminary result)    Collection Time: 12/29/16  3:43 PM  Result Value Ref Range Status   Specimen Description RIGHT ANTECUBITAL  Final   Special Requests BOTTLES DRAWN AEROBIC AND ANAEROBIC [facility]6CC EACH  Final   Culture PENDING  Incomplete   Report Status PENDING  Incomplete     Radiological Exams on Admission: Dg Foot Complete Left  Result Date: 12/29/2016 CLINICAL DATA:  Left foot wound, pain, swelling and redness. EXAM: LEFT FOOT - COMPLETE 3+ VIEW COMPARISON:  None available FINDINGS: Left first MTP joint demonstrates degenerative osteoarthritis with associated hallux valgus deformity, joint space loss, sclerosis and bony spurring. No acute osseous finding or fracture. Dorsal foot soft tissue swelling on the lateral view. No visualized radiopaque or metallic foreign body. No plain radiographic evidence of osseous destruction or bone loss. 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 Electronically Signed   By: Judie PetitM.  Shick M.D.   On: 12/29/2016 20:05   Case discussed with edp   Assessment/Plan 56 yo male with left leg cellulitis despite oral abx as outpatient  Principal Problem:   Cellulitis- place on iv vancomycin.  Nontoxic appearing.  Elevate leg.  Pulses intact.    DVT prophylaxis:  scds  Code Status:  full Family Communication:  none  Disposition Plan:  Per day team Consults called:  none Admission status:  admission   Shirleen Mcfaul A MD [facility]  If 7PM-7AM, please contact night-coverage [email or web address] Password [email or web address]  12/29/2016, 10:14 PM

## 2016-12-29 NOTE — ED Triage Notes (Signed)
PT states treatment for an abscess to lateral left lower leg unrelieved by 2 different rounds of antibiotics over the past month. PT states he finished Augmentin 12/22/16 and redness/drainage is still present. PT denies any fevers.

## 2016-12-30 ENCOUNTER — Inpatient Hospital Stay (HOSPITAL_COMMUNITY): Payer: Self-pay

## 2016-12-30 ENCOUNTER — Other Ambulatory Visit (HOSPITAL_COMMUNITY): Payer: Self-pay | Admitting: Family Medicine

## 2016-12-30 ENCOUNTER — Inpatient Hospital Stay (HOSPITAL_COMMUNITY): Admit: 2016-12-30 | Payer: Self-pay

## 2016-12-30 LAB — BASIC METABOLIC PANEL
Anion gap: 6 (ref 5–15)
BUN: 8 mg/dL (ref 6–20)
CHLORIDE: 107 mmol/L (ref 101–111)
CO2: 24 mmol/L (ref 22–32)
CREATININE: 0.81 mg/dL (ref 0.61–1.24)
Calcium: 8.3 mg/dL — ABNORMAL LOW (ref 8.9–10.3)
GFR calc Af Amer: >60 mL/min (ref >60)
GFR calc non Af Amer: >60 mL/min (ref >60)
GLUCOSE: 119 mg/dL — AB (ref 65–99)
Potassium: 3.3 mmol/L — ABNORMAL LOW (ref 3.5–5.1)
Sodium: 137 mmol/L (ref 135–145)

## 2016-12-30 LAB — CBC
HCT: 39.5 % (ref 39.0–52.0)
Hemoglobin: 13.7 g/dL (ref 13.0–17.0)
MCH: 31.5 pg (ref 26.0–34.0)
MCHC: 34.7 g/dL (ref 30.0–36.0)
MCV: 90.8 fL (ref 78.0–100.0)
PLATELETS: 301 10*3/uL (ref 150–400)
RBC: 4.35 MIL/uL (ref 4.22–5.81)
RDW: 13 % (ref 11.5–15.5)
WBC: 8.2 10*3/uL (ref 4.0–10.5)

## 2016-12-30 LAB — I-STAT CG4 LACTIC ACID, ED: LACTIC ACID, VENOUS: 1.17 mmol/L (ref 0.5–1.9)

## 2016-12-30 MED ORDER — ONDANSETRON HCL 4 MG/2ML IJ SOLN: 4.0000 mg | Freq: Four times a day (QID) | INTRAMUSCULAR | Status: DC | PRN

## 2016-12-30 MED ORDER — ENOXAPARIN SODIUM 40 MG/0.4ML ~~LOC~~ SOLN
40.0000 mg | SUBCUTANEOUS | Status: DC
  Administered 2016-12-30 – 2017-01-06 (×9): 40 mg via SUBCUTANEOUS
  Filled 2016-12-30 (×10): qty 0.4

## 2016-12-30 MED ORDER — VANCOMYCIN HCL 10 G IV SOLR
1500.0000 mg | Freq: Two times a day (BID) | INTRAVENOUS | Status: DC
  Administered 2016-12-30 – 2016-12-31 (×2): 1500 mg via INTRAVENOUS
  Filled 2016-12-30 (×4): qty 1500

## 2016-12-30 MED ORDER — SODIUM CHLORIDE 0.9 % IV SOLN: INTRAVENOUS | Status: DC

## 2016-12-30 MED ORDER — OXYCODONE HCL 5 MG PO TABS
5.0000 mg | ORAL_TABLET | ORAL | Status: DC | PRN
  Administered 2016-12-30 – 2017-01-07 (×23): 5 mg via ORAL
  Filled 2016-12-30 (×23): qty 1

## 2016-12-30 MED ORDER — ONDANSETRON HCL 4 MG PO TABS: 4.0000 mg | ORAL_TABLET | Freq: Four times a day (QID) | ORAL | Status: DC | PRN

## 2016-12-30 MED ORDER — SODIUM CHLORIDE 0.9 % IV SOLN
INTRAVENOUS | Status: DC
  Administered 2016-12-30: 01:00:00 via INTRAVENOUS

## 2016-12-30 MED ORDER — LEVOTHYROXINE SODIUM 50 MCG PO TABS
50.0000 ug | ORAL_TABLET | Freq: Every day | ORAL | Status: DC
  Administered 2016-12-30 – 2017-01-15 (×14): 50 ug via ORAL
  Filled 2016-12-30 (×14): qty 1

## 2016-12-30 MED ORDER — POTASSIUM CHLORIDE CRYS ER 20 MEQ PO TBCR
40.0000 meq | EXTENDED_RELEASE_TABLET | Freq: Once | ORAL | Status: AC
  Administered 2016-12-30: 40 meq via ORAL
  Filled 2016-12-30: qty 2

## 2016-12-30 MED ORDER — LEVOTHYROXINE SODIUM 50 MCG PO TABS: 50.0000 ug | ORAL_TABLET | Freq: Every day | ORAL | Status: DC

## 2016-12-30 MED ORDER — ASPIRIN EC 81 MG PO TBEC
81.0000 mg | DELAYED_RELEASE_TABLET | Freq: Every day | ORAL | Status: DC
  Administered 2016-12-30 – 2017-01-06 (×8): 81 mg via ORAL
  Filled 2016-12-30 (×8): qty 1

## 2016-12-30 MED ORDER — VANCOMYCIN HCL IN DEXTROSE 1-5 GM/200ML-% IV SOLN
1000.0000 mg | Freq: Once | INTRAVENOUS | Status: DC
  Administered 2016-12-30: 1000 mg via INTRAVENOUS
  Filled 2016-12-30: qty 200

## 2016-12-30 MED ORDER — ACETAMINOPHEN 325 MG PO TABS
650.0000 mg | ORAL_TABLET | Freq: Four times a day (QID) | ORAL | Status: DC | PRN
  Administered 2017-01-02 – 2017-01-03 (×2): 650 mg via ORAL
  Filled 2016-12-30 (×2): qty 2

## 2016-12-30 NOTE — Progress Notes (Signed)
  PROGRESS NOTE  Ian Wong ZOX:[id number] DOB: 08/19/1961 DOA: 12/29/2016 PCP: No PCP Per Patient  Brief Narrative: 56 year old man presented with ongoing left leg infection, failing to improve with 3 rounds of oral antibiotics as an outpatient. Admitted for refractory cellulitis.  Assessment/Plan 1. Left lower extremity cellulitis in 2 discrete spots. Failed outpatient antibiotics. No evidence of complicating features. Possibly secondary to spider bite given shallow ulcer. 2. Shallow ulcer left lateral lower leg. 3. Blister over the dorsum of the foot.    Hemodynamics are stable, afebrile. Continue IV antibiotics. Perfusion appears intact. However the nature of his wounds does raise the possibility of chronic ischemia although cellulitis status post spider bite may have a similar presentation. Currently he has good perfusion of the leg there is no evidence of critical ischemia.  Continue empiric antibiotics, consult wound care, check lower extremity ABI  DVT prophylaxis: Lovenox Code Status: full code Family Communication: none Disposition Plan: home   Brendia Sacksaniel Goodrich, MD  [facility] Direct contact: [phone] --Via amion app OR  --[email or web address]; password [email or web address]  7PM-7AM contact night coverage as above 12/30/2016, 1:02 PM  LOS: 1 day   Consultants:    Procedures:    Antimicrobials:  Vancomycin 1/29 >>  Interval history/Subjective: Feels about the same. Some pain in his left leg.  Objective: Vitals:   12/29/16 2013 12/30/16 0045 12/30/16 0444 12/30/16 0540  BP: 134/70 144/76 132/63 (!) 155/64  Pulse: 94 70 78 82  Resp: 20 20 20 18   Temp:    97.7 F (36.5 C)  TempSrc:    Oral  SpO2: 97% 98% 97% 99%  Weight:    83.1 kg (183 lb 4.8 oz)  Height:    5\' 11"  (1.803 m)    Intake/Output Summary (Last 24 hours) at 12/30/16 1302 Last data filed at 12/30/16 0900  Gross per 24 hour  Intake             1300 ml  Output             1100 ml  Net               200 ml     Filed Weights   12/29/16 1512 12/30/16 0540  Weight: 88 kg (194 lb) 83.1 kg (183 lb 4.8 oz)    Exam:    Constitutional: Appears calm, comfortable.   Cardiovascular regular rate and rhythm. No murmur, rub or gallop. Minimal left lower extremity edema of the foot.  Respiratory clear to auscultation bilaterally. No wheezes, rales or rhonchi. Normal respiratory effort.   Skin. Shallow ulcer over the left lateral lower leg proximally 2 cm, ovoid. No exudate noted. Minimal surrounding erythema. Blister over the dorsum of the foot, tender to palpation. No exudate. No break in skin. No fluctuance. Pedal edema noted with surrounding erythema. Foot is warm, dry with grossly normal perfusion, excellent capillary refill.    I have personally reviewed following labs and imaging studies: Potassium 3.3, remainder basic metabolic panel unremarkable. Lactic acid is normalized. CBC unremarkable. Blood cultures pending.  Scheduled Meds: . aspirin EC  81 mg Oral Daily  . enoxaparin (LOVENOX) injection  40 mg Subcutaneous Q24H  . levothyroxine  50 mcg Oral QAC breakfast  . vancomycin  1,500 mg Intravenous Q12H   Continuous Infusions:  Principal Problem:   Cellulitis of left lower extremity Active Problems:   Cellulitis   LOS: 1 day

## 2016-12-30 NOTE — Progress Notes (Addendum)
Pharmacy Antibiotic Note  Ian Wong is a 56 y.o. male admitted on 12/29/2016 with cellulitis.  Pharmacy has been consulted for Vancomycin dosing.  Plan: Vancomycin 1500mg  IV every 12 hours.  Goal trough 10-15 mcg/mL.  F/U cultures and clinical progress Monitor V/S, labs, and levels as indicated  Height: 5\' 11"  (180.3 cm) Weight: 183 lb 4.8 oz (83.1 kg) IBW/kg (Calculated) : 75.3  Temp (24hrs), Avg:97.9 F (36.6 C), Min:97.7 F (36.5 C), Max:98.1 F (36.7 C)   Recent Labs Lab 12/29/16 1543 12/29/16 2013 12/30/16 0059 12/30/16 0445  WBC 10.2  --   --  8.2  CREATININE 0.96  --   --  0.81  LATICACIDVEN  --  2.15* 1.17  --     Normalized crcl 15905mls/min  Estimated Creatinine Clearance: 111 mL/min (by C-G formula based on SCr of 0.81 mg/dL).    No Known Allergies  Antimicrobials this admission: Vancomycin 1/29 >>   Dose adjustments this admission: n/a  Microbiology results: 1/29 BCx: pending  Thank you for allowing pharmacy to be a part of this patient's care.  Elder CyphersLorie Nelissa Bolduc, BS Pharm D, BCPS Clinical Pharmacist Pager [phone] 12/30/2016 8:20 AM

## 2016-12-30 NOTE — Consult Note (Signed)
WOC Nurse wound consult note, consult performed via telehealth technology. 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  Has been treating at home with antibiotic ointment.  New area on the dorsal foot just came up in last week. He has been treated with PO antibiotics. Now admitted with continued ulceration and cellulitis.  Bedside nurse reports weak put palpable pulse in the foot and the wound do not present like typical ischemic wounds.  Unclear etiology.  Wound type: Partial thickness ulcerations left lateral calf and dorsal foot Pressure Injury POA: No Measurement: Dorsal foot 5cm x 5cm x 0 affected area with bulla centrally aprox 3cm x 3cm circular Left lateral calf: 2.5cm x 2.0cm x 0.3cm  Wound bed: Dorsal foot intact bulla with some superficial skin sloughing at wound perimeter Left lateral calf: 10% red and ruddy at wound edges, 90% yellow centrally Drainage (amount, consistency, odor) scant per bedside nurse who removed foam dressing just prior to my consultation Periwound: 2+ edema and erythema  noted more distal at the dorsal foot wound  Dressing procedure/placement/frequency: Unclear etiology, systemic antibiotics may clear cellulitis.  Will use xeroform as topical treatment for antibacterial properties and non stick.   Would recommend dermatology follow up as an outpatient.   Discussed POC with patient and bedside nurse.  Re consult if needed, will not follow at this time. Thanks  Yuval Rubens M.[employer] MSN, RN,CWOCN, CNS [phone])

## 2016-12-30 NOTE — Progress Notes (Signed)
ANTIBIOTIC CONSULT NOTE-Preliminary  Pharmacy Consult for vancomycin Indication: cellulitis  No Known Allergies  Patient Measurements: Height: 5\' 11"  (180.3 cm) Weight: 194 lb (88 kg) IBW/kg (Calculated) : 75.3 Adjusted Body Weight:   Vital Signs: Temp: 98.1 F (36.7 C) (01/29 1513) Temp Source: Oral (01/29 1513) BP: 144/76 (01/30 0045) Pulse Rate: 70 (01/30 0045)  Labs:  Recent Labs  12/29/16 1543  WBC 10.2  HGB 15.3  PLT 311  CREATININE 0.96    Estimated Creatinine Clearance: 93.7 mL/min (by C-G formula based on SCr of 0.96 mg/dL).  No results for input(s): VANCOTROUGH, VANCOPEAK, VANCORANDOM, GENTTROUGH, GENTPEAK, GENTRANDOM, TOBRATROUGH, TOBRAPEAK, TOBRARND, AMIKACINPEAK, AMIKACINTROU, AMIKACIN in the last 72 hours.   Microbiology: Recent Results (from the past 720 hour(s))  Blood culture (routine x 2)     Status: None (Preliminary result)   Collection Time: 12/29/16  3:43 PM  Result Value Ref Range Status   Specimen Description RIGHT ANTECUBITAL  Final   Special Requests BOTTLES DRAWN AEROBIC AND ANAEROBIC [facility]6CC EACH  Final   Culture PENDING  Incomplete   Report Status PENDING  Incomplete    Medical History: History reviewed. No pertinent past medical history.  Medications:   (Not in a hospital admission) Scheduled:  . aspirin EC  81 mg Oral Daily  . enoxaparin (LOVENOX) injection  40 mg Subcutaneous Q24H  . levothyroxine  50 mcg Oral Daily   Infusions:  . sodium chloride 75 mL/hr at 12/30/16 0042  . vancomycin     PRN: ondansetron **OR** ondansetron (ZOFRAN) IV, oxyCODONE Anti-infectives    Start     Dose/Rate Route Frequency Ordered Stop   12/30/16 0800  vancomycin (VANCOCIN) IVPB 1000 mg/200 mL premix     1,000 mg 200 mL/hr over 60 Minutes Intravenous  Once 12/30/16 0134     12/29/16 1930  vancomycin (VANCOCIN) IVPB 1000 mg/200 mL premix     1,000 mg 200 mL/hr over 60 Minutes Intravenous  Once 12/29/16 1927 12/29/16 2110       Assessment: 56 yo male with left leg wound infection. Starting vancomycin for cellulitis.   Goal of Therapy:  Vancomycin trough level 10-15 mcg/ml  Plan:  Preliminary review of pertinent patient information completed.  Protocol will be initiated with a one-time dose(s) of vancomycin 1 gram due at 0800, 12 hours after ED dose.  Jeani HawkingAnnie Penn clinical pharmacist will complete review during morning rounds to assess patient and finalize treatment regimen.  Elynor Kallenberger Scarlett, RPH 12/30/2016,1:35 AM

## 2016-12-31 ENCOUNTER — Inpatient Hospital Stay (HOSPITAL_COMMUNITY): Payer: Self-pay

## 2016-12-31 MED ORDER — IOPAMIDOL (ISOVUE-370) INJECTION 76%
150.0000 mL | Freq: Once | INTRAVENOUS | Status: AC | PRN
  Administered 2016-12-31: 150 mL via INTRAVENOUS

## 2016-12-31 NOTE — Progress Notes (Signed)
PROGRESS NOTE  Ian Wong ZOX:[id number] DOB: 03/19/1961 DOA: 12/29/2016 PCP: No PCP Per Patient  Brief Narrative: 56 year old man presented with ongoing left leg infection, failing to improve with 3 rounds of oral antibiotics as an outpatient. Admitted for refractory cellulitis.  Assessment/Plan 1. Left lower ischemia with poorly healing wounds LLE.  I consulted vascular surgery they feel that patient may need revascularization surgery.  CTA aorta with B/L Le runoff ordered and pending.  I think his poorly healing wounds are related to poor perfusion and would go ahead and DC vanc today.  Continue topical wound care.  Appreciate WOC nurse recommendations.  2. Shallow ulcer left lateral lower leg - as above 3. Blister over the dorsum of the foot. 4. Tobacco abuse - counseled at bedside and patient is highly motivated to quit at this time, he declined nicotine patch.  5.  DVT prophylaxis: Lovenox Code Status: full code Family Communication: none Disposition Plan: home   Maryln Manuel  MD  [facility] Direct contact: [phone] --Via amion app OR  --[email or web address]; password [email or web address]  7PM-7AM contact night coverage as above  12/31/2016, 2:05 PM  LOS: 2 days   Consultants:  Vascular surgery  Procedures:  pending  Antimicrobials:  Vancomycin 1/29 >>1/31  Interval history/Subjective: Left leg pain and foot pain with ambulation.    Objective: Vitals:   12/30/16 0444 12/30/16 0540 12/30/16 2016 12/31/16 0400  BP: 132/63 (!) 155/64 133/68 (!) 156/73  Pulse: 78 82 78 79  Resp: 20 18 18 18   Temp:  97.7 F (36.5 C) 98.2 F (36.8 C) 97.5 F (36.4 C)  TempSrc:  Oral Oral Oral  SpO2: 97% 99% 97% 97%  Weight:  83.1 kg (183 lb 4.8 oz)  82.2 kg (181 lb 4.8 oz)  Height:  5\' 11"  (1.803 m)      Intake/Output Summary (Last 24 hours) at 12/31/16 1405 Last data filed at 12/31/16 0900  Gross per 24 hour  Intake              740 ml  Output             2600 ml  Net             -1860 ml     Filed Weights   12/29/16 1512 12/30/16 0540 12/31/16 0400  Weight: 88 kg (194 lb) 83.1 kg (183 lb 4.8 oz) 82.2 kg (181 lb 4.8 oz)    Exam:    Constitutional: Appears calm, comfortable. NAD.   Cardiovascular: regular rate and rhythm. No murmur, rub or gallop.  Respiratory: clear to auscultation bilaterally. No wheezes, rales or rhonchi. Normal respiratory effort.   Skin. Shallow ulcer over the left lateral lower leg proximally 2 cm, ovoid. No exudate noted.  Foot is warm, dry.  Minimal surrounding erythema. Blister over the dorsum of the foot, tender to palpation. No exudate. No break in skin. No fluctuance. Pedal edema noted with surrounding erythema.  Results for orders placed or performed during the hospital encounter of 12/29/16  Blood culture (routine x 2)  Result Value Ref Range   Specimen Description RIGHT ANTECUBITAL    Special Requests BOTTLES DRAWN AEROBIC AND ANAEROBIC 6CC EACH    Culture NO GROWTH 2 DAYS    Report Status PENDING   Blood culture (routine x 2)  Result Value Ref Range   Specimen Description BLOOD    Special Requests NONE    Culture NO GROWTH 2 DAYS    Report Status PENDING   CBC  with Differential  Result Value Ref Range   WBC 10.2 4.0 - 10.5 K/uL   RBC 4.84 4.22 - 5.81 MIL/uL   Hemoglobin 15.3 13.0 - 17.0 g/dL   HCT 34.743.4 42.539.0 - 95.652.0 %   MCV 89.7 78.0 - 100.0 fL   MCH 31.6 26.0 - 34.0 pg   MCHC 35.3 30.0 - 36.0 g/dL   RDW 38.712.8 56.411.5 - 33.215.5 %   Platelets 311 150 - 400 K/uL   Neutrophils Relative % 65 %   Neutro Abs 6.6 1.7 - 7.7 K/uL   Lymphocytes Relative 19 %   Lymphs Abs 1.9 0.7 - 4.0 K/uL   Monocytes Relative 15 %   Monocytes Absolute 1.5 (H) 0.1 - 1.0 K/uL   Eosinophils Relative 1 %   Eosinophils Absolute 0.1 0.0 - 0.7 K/uL   Basophils Relative 0 %   Basophils Absolute 0.0 0.0 - 0.1 K/uL  Basic metabolic panel  Result Value Ref Range   Sodium 133 (L) 135 - 145 mmol/L   Potassium 3.7 3.5 - 5.1 mmol/L   Chloride 99 (L)  101 - 111 mmol/L   CO2 25 22 - 32 mmol/L   Glucose, Bld 101 (H) 65 - 99 mg/dL   BUN 8 6 - 20 mg/dL   Creatinine, Ser 9.510.96 0.61 - 1.24 mg/dL   Calcium 9.0 8.9 - 88.410.3 mg/dL   GFR calc non Af Amer >60 >60 mL/min   GFR calc Af Amer >60 >60 mL/min   Anion gap 9 5 - 15  Basic metabolic panel  Result Value Ref Range   Sodium 137 135 - 145 mmol/L   Potassium 3.3 (L) 3.5 - 5.1 mmol/L   Chloride 107 101 - 111 mmol/L   CO2 24 22 - 32 mmol/L   Glucose, Bld 119 (H) 65 - 99 mg/dL   BUN 8 6 - 20 mg/dL   Creatinine, Ser 1.660.81 0.61 - 1.24 mg/dL   Calcium 8.3 (L) 8.9 - 10.3 mg/dL   GFR calc non Af Amer >60 >60 mL/min   GFR calc Af Amer >60 >60 mL/min   Anion gap 6 5 - 15  CBC  Result Value Ref Range   WBC 8.2 4.0 - 10.5 K/uL   RBC 4.35 4.22 - 5.81 MIL/uL   Hemoglobin 13.7 13.0 - 17.0 g/dL   HCT 06.339.5 01.639.0 - 01.052.0 %   MCV 90.8 78.0 - 100.0 fL   MCH 31.5 26.0 - 34.0 pg   MCHC 34.7 30.0 - 36.0 g/dL   RDW 93.213.0 35.511.5 - 73.215.5 %   Platelets 301 150 - 400 K/uL  I-Stat CG4 Lactic Acid, ED  Result Value Ref Range   Lactic Acid, Venous 2.15 (HH) 0.5 - 1.9 mmol/L  I-Stat CG4 Lactic Acid, ED  Result Value Ref Range   Lactic Acid, Venous 1.17 0.5 - 1.9 mmol/L   Scheduled Meds: . aspirin EC  81 mg Oral Daily  . enoxaparin (LOVENOX) injection  40 mg Subcutaneous Q24H  . levothyroxine  50 mcg Oral QAC breakfast  . vancomycin  1,500 mg Intravenous Q12H   Continuous Infusions:  Principal Problem:   Cellulitis of left lower extremity Active Problems:   Cellulitis   LOS: 2 days

## 2016-12-31 NOTE — Consult Note (Signed)
SURGICAL CONSULTATION NOTE (initial) - cpt: [id number]  HISTORY OF PRESENT ILLNESS (HPI):  56 y.o. male presented to [facility] ED 2 days ago with LLE pain and erythema associated with chronic Left dorsal foot 3 cm x 3 cm wound and Left lateral calf 2 cm x 2 cm wound x 1 month, for which he has been treated with oral antibiotics x 3 courses. Patient states he was told the Left calf wound may have been due to a spider bite, but he says that he does not personally know the etiology of the wounds. He also says that he's been advised by his clinic PMD and his wife to replace his shoes for several years and has not. Patient reports severe LLE <1-block claudication x 4+ years with Left foot pain relieved by dependent position x 6-12 months that hasn't been relieved with dependent position x1 month, moderate RLE 1-2 block claudication x 2-3 years, and erectile dysfunction (inability to have an erection) x 3 years. He denies fever/chills, CP, or SOB.  Surgery is consulted by medical physician Dr. Laural Benes in this context for evaluation and management of LLE wounds.  PAST MEDICAL HISTORY (PMH):  History reviewed. No pertinent past medical history.   PAST SURGICAL HISTORY (PSH):  Past Surgical History:  Procedure Laterality Date  . I&D EXTREMITY Left 11/28/2014   Procedure: IRRIGATION AND DEBRIDEMENT;  REPAIR OF CRUSH INJURY;  Surgeon: Betha Loa, MD;  Location: [facility] OR;  Service: Orthopedics;  Laterality: Left;     MEDICATIONS:  Prior to Admission medications   Medication Sig Start Date End Date Taking? Authorizing Provider  aspirin EC 81 MG tablet Take 81 mg by mouth daily.   Yes Historical Provider, MD  levothyroxine (SYNTHROID, LEVOTHROID) 50 MCG tablet Take 50 mcg by mouth daily. 11/20/16  Yes Historical Provider, MD  naproxen sodium (ALEVE) 220 MG tablet Take 220-440 mg by mouth daily as needed (for pain).   Yes Historical Provider, MD     ALLERGIES:  No Known Allergies   SOCIAL HISTORY:  Social History    Social History  . Marital status: Married    Spouse name: N/A  . Number of children: N/A  . Years of education: N/A   Occupational History  . Not on file.   Social History Main Topics  . Smoking status: Current Every Day Smoker    Packs/day: 0.50  . Smokeless tobacco: Never Used  . Alcohol use Yes     Comment: last drank etoh 1 1/2 months ago.  . Drug use: No  . Sexual activity: Not on file   Other Topics Concern  . Not on file   Social History Narrative  . No narrative on file    The patient currently resides (home / rehab facility / nursing home): Home  The patient normally is (ambulatory / bedbound): Ambulatory   FAMILY HISTORY:  History reviewed. No pertinent family history.   REVIEW OF SYSTEMS:  Constitutional: denies weight loss, fever, chills, or sweats  Eyes: denies any other vision changes, history of eye injury  ENT: denies sore throat, hearing problems  Respiratory: denies shortness of breath, wheezing  Cardiovascular: claudication and ischemic rest pain as per HPI, denies chest pain or palpitations  Gastrointestinal: denies abdominal pain, N/V, or diarrhea Genitourinary: erectile dysfunction as per HPI, denies burning with urination or urinary frequency Musculoskeletal: denies any other joint pains or cramps except claudication and ischemic rest pain as per HPI Skin: denies any other rashes or skin discolorations except Left dorsal  foot and Left lateral mid-calf wounds as per HPI Neurological: denies any other headache, dizziness, weakness  Psychiatric: denies any other depression, anxiety   All other review of systems were negative   VITAL SIGNS:  Temp:  [97.5 F (36.4 C)-98.2 F (36.8 C)] 97.5 F (36.4 C) (01/31 0400) Pulse Rate:  [78-79] 79 (01/31 0400) Resp:  [18] 18 (01/31 0400) BP: (133-156)/(68-73) 156/73 (01/31 0400) SpO2:  [97 %] 97 % (01/31 0400) Weight:  [82.2 kg (181 lb 4.8 oz)] 82.2 kg (181 lb 4.8 oz) (01/31 0400)     Height: 5\' 11"   (180.3 cm) Weight: 82.2 kg (181 lb 4.8 oz) BMI (Calculated): 25.6   INTAKE/OUTPUT:  This shift: No intake/output data recorded.  Last 2 shifts: @IOLAST2SHIFTS @   PHYSICAL EXAM:  Constitutional:  -- Normal body habitus  -- Awake, alert, and oriented x3  Eyes:  -- Pupils equally round and reactive to light  -- No scleral icterus  Ear, nose, and throat:  -- No jugular venous distension  Pulmonary:  -- No crackles  -- Equal breath sounds bilaterally -- Breathing non-labored at rest Cardiovascular:  -- S1, S2 present  -- No pericardial rubs Gastrointestinal:  -- Abdomen soft, nontender, nondistended, no guarding/rebound  -- No abdominal masses appreciated, pulsatile or otherwise  Musculoskeletal and Integumentary:  -- Wounds or skin discoloration: Left dorsal foot tender 3 cm x 3 cm wound with dry eschar and mild peri-wound erythema, Left lateral calf tender 2 cm x 2 cm wound without eschar or drainage, mild peri-wound erythema -- Extremities: B/L UE and LE FROM, hands and feet warm, no edema  Neurologic:  -- Motor function: intact and symmetric -- Sensation: intact and symmetric  Pulse/Doppler Exam: (p=palpable; d=doppler signals; 0=none)      Right   Left   Brach   p   p   Rad   p   p   Fem     strong mono  mono  DP   mono   0   PT   mono   mono   Per   mono           weak mono   Labs:  CBC Latest Ref Rng & Units 12/30/2016 12/29/2016 11/28/2014  WBC 4.0 - 10.5 K/uL 8.2 10.2 12.3(H)  Hemoglobin 13.0 - 17.0 g/dL 16.113.7 09.615.3 04.515.8  Hematocrit 39.0 - 52.0 % 39.5 43.4 44.3  Platelets 150 - 400 K/uL 301 311 292   CMP Latest Ref Rng & Units 12/30/2016 12/29/2016 11/28/2014  Glucose 65 - 99 mg/dL 409(W119(H) 119(J101(H) 478(G107(H)  BUN 6 - 20 mg/dL 8 8 17   Creatinine 0.61 - 1.24 mg/dL 9.560.81 2.130.96 0.860.87  Sodium 135 - 145 mmol/L 137 133(L) 138  Potassium 3.5 - 5.1 mmol/L 3.3(L) 3.7 4.1  Chloride 101 - 111 mmol/L 107 99(L) 109  CO2 22 - 32 mmol/L 24 25 22   Calcium 8.9 - 10.3 mg/dL 8.3(L) 9.0 9.5     Imaging studies:  Ankle-Brachial Indeces with PVR (12/30/2016) Right ABI:  0.50  Left ABI:  0.39  Right Lower Extremity:  Monophasic waveforms distally  Left Lower Extremity:  Indeterminate waveforms distally  CT Abdomen and Pelvis (01/18/2014) performed for diverticulitis - personally reviewed and discussed with patient and medical physician Atherosclerotic plaque is identified in the abdominal aorta and diffusely throughout the iliac arteries without evidence of aneurysm.   Assessment/Plan: (ICD-10's: I70.244, I70.242, I70.213) 56 y.o. male with LLE critical limb ischemia with non-healing Left dorsal foot wound (likely associated  with chronic rubbing/pressure of poorly fitted footwear) and Left mid-calf wound x 1 month of unclear etiology, RLE claudication, and erectile dysfunction consistent with Leriche syndrome, complicated by pertinent comorbidities including chronic progressive LLE ischemic rest pain and tobacco abuse.   - anticipate will require revascularization  - check CTA aorta with B/L lower extremity runoff (Cr WNL)  - reverse Trendelenburg bed position prn for Left foot pain relief  - medical management of co-morbidities as per medical team  - absolute and immediate smoking cessation encouraged  - DVT prophylaxis  All of the above findings and recommendations were discussed with the patient, and all of patient's questions were answered to his expressed satisfaction.  Thank you for the opportunity to participate in this patient's care.   -- Scherrie Gerlach Earlene Plater, MD, RPVI [place]: [facility] General Surgery [facility] Vascular Care Office: [phone]

## 2016-12-31 NOTE — Progress Notes (Signed)
Pt in room with call within reach. Nothing needed at this time.

## 2017-01-01 LAB — CBC
HEMATOCRIT: 43.3 % (ref 39.0–52.0)
HEMOGLOBIN: 15.1 g/dL (ref 13.0–17.0)
MCH: 31.5 pg (ref 26.0–34.0)
MCHC: 34.9 g/dL (ref 30.0–36.0)
MCV: 90.2 fL (ref 78.0–100.0)
Platelets: 300 10*3/uL (ref 150–400)
RBC: 4.8 MIL/uL (ref 4.22–5.81)
RDW: 12.9 % (ref 11.5–15.5)
WBC: 8 10*3/uL (ref 4.0–10.5)

## 2017-01-01 LAB — BASIC METABOLIC PANEL
ANION GAP: 7 (ref 5–15)
BUN: 10 mg/dL (ref 6–20)
CALCIUM: 8.8 mg/dL — AB (ref 8.9–10.3)
CO2: 26 mmol/L (ref 22–32)
Chloride: 104 mmol/L (ref 101–111)
Creatinine, Ser: 0.85 mg/dL (ref 0.61–1.24)
Glucose, Bld: 93 mg/dL (ref 65–99)
POTASSIUM: 3.7 mmol/L (ref 3.5–5.1)
Sodium: 137 mmol/L (ref 135–145)

## 2017-01-01 MED ORDER — POLYETHYLENE GLYCOL 3350 17 G PO PACK
17.0000 g | PACK | Freq: Every day | ORAL | Status: DC | PRN
  Administered 2017-01-01 – 2017-01-05 (×2): 17 g via ORAL
  Filled 2017-01-01 (×3): qty 1

## 2017-01-01 MED ORDER — SENNOSIDES-DOCUSATE SODIUM 8.6-50 MG PO TABS
2.0000 | ORAL_TABLET | Freq: Every day | ORAL | Status: DC
  Administered 2017-01-01 – 2017-01-06 (×6): 2 via ORAL
  Filled 2017-01-01 (×6): qty 2

## 2017-01-01 NOTE — Progress Notes (Signed)
Dressing changed performed per Md orders. Pt did c/o of some pain about 6 1-10 scale afterwards. Prn pain med given.

## 2017-01-01 NOTE — Progress Notes (Signed)
PROGRESS NOTE  Smitty CordsRoger Allen Ohnemus RUE:[id number] DOB: 07/08/1961 DOA: 12/29/2016 PCP: No PCP Per Patient  Brief Narrative: 56 year old man presented with ongoing left leg infection, failing to improve with 3 rounds of oral antibiotics as an outpatient. Admitted for refractory cellulitis.  Assessment/Plan 1. Critical BLE ischemia with poorly healing wounds LLE.  I consulted vascular surgery they feel that patient may need revascularization surgery.  CTA aorta with B/L Le runoff ordered and does show significant disease.  Further mgmt per vascular team.  I think his poorly healing wounds are related to poor perfusion.  Continue topical wound care.  Appreciate WOC nurse recommendations.  2. Shallow ulcer left lateral lower leg - as above 3. Blister over the dorsum of the foot. 4. Tobacco abuse - counseled at bedside and patient is highly motivated to quit at this time, he declined nicotine patch.   DVT prophylaxis: Lovenox Code Status: full code Family Communication: none Disposition Plan: home   Maryln Manuel. Johnson  MD  [facility] Direct contact: [phone] --Via amion app OR  --[email or web address]; password [email or web address]  7PM-7AM contact night coverage as above  01/01/2017, 2:11 PM  LOS: 3 days   Consultants:  Vascular surgery  Procedures:  pending  Antimicrobials:  Vancomycin 1/29 >>1/31  Interval history/Subjective: Left leg pain and foot pain with ambulation.    Objective: Vitals:   12/30/16 2016 12/31/16 0400 12/31/16 2139 01/01/17 0622  BP: 133/68 (!) 156/73 (!) 149/61 (!) 135/56  Pulse: 78 79 76 80  Resp: 18 18 18 18   Temp: 98.2 F (36.8 C) 97.5 F (36.4 C) 98.4 F (36.9 C) 97.7 F (36.5 C)  TempSrc: Oral Oral Oral Oral  SpO2: 97% 97% 96% 96%  Weight:  82.2 kg (181 lb 4.8 oz)  81.4 kg (179 lb 8 oz)  Height:       No intake or output data in the 24 hours ending 01/01/17 1411   Filed Weights   12/30/16 0540 12/31/16 0400 01/01/17 0622  Weight: 83.1 kg (183 lb 4.8 oz)  82.2 kg (181 lb 4.8 oz) 81.4 kg (179 lb 8 oz)    Exam:    Constitutional: Appears calm, comfortable. NAD.   Cardiovascular: regular rate and rhythm. No murmur, rub or gallop.  Respiratory: clear to auscultation bilaterally. No wheezes, rales or rhonchi. Normal respiratory effort.   Skin. Shallow ulcer over the left lateral lower leg proximally 2 cm, ovoid. No exudate noted.  Foot is warm, dry.  Minimal surrounding erythema. Blister over the dorsum of the foot, tender to palpation. No exudate. No break in skin. No fluctuance. Pedal edema noted with surrounding erythema.  Results for orders placed or performed during the hospital encounter of 12/29/16  Blood culture (routine x 2)  Result Value Ref Range   Specimen Description RIGHT ANTECUBITAL    Special Requests BOTTLES DRAWN AEROBIC AND ANAEROBIC 6CC EACH    Culture NO GROWTH 3 DAYS    Report Status PENDING   Blood culture (routine x 2)  Result Value Ref Range   Specimen Description BLOOD    Special Requests NONE    Culture NO GROWTH 3 DAYS    Report Status PENDING   CBC with Differential  Result Value Ref Range   WBC 10.2 4.0 - 10.5 K/uL   RBC 4.84 4.22 - 5.81 MIL/uL   Hemoglobin 15.3 13.0 - 17.0 g/dL   HCT 30.843.4 65.739.0 - 84.652.0 %   MCV 89.7 78.0 - 100.0 fL   MCH 31.6 26.0 - 34.0 pg  MCHC 35.3 30.0 - 36.0 g/dL   RDW 16.1 09.6 - 04.5 %   Platelets 311 150 - 400 K/uL   Neutrophils Relative % 65 %   Neutro Abs 6.6 1.7 - 7.7 K/uL   Lymphocytes Relative 19 %   Lymphs Abs 1.9 0.7 - 4.0 K/uL   Monocytes Relative 15 %   Monocytes Absolute 1.5 (H) 0.1 - 1.0 K/uL   Eosinophils Relative 1 %   Eosinophils Absolute 0.1 0.0 - 0.7 K/uL   Basophils Relative 0 %   Basophils Absolute 0.0 0.0 - 0.1 K/uL  Basic metabolic panel  Result Value Ref Range   Sodium 133 (L) 135 - 145 mmol/L   Potassium 3.7 3.5 - 5.1 mmol/L   Chloride 99 (L) 101 - 111 mmol/L   CO2 25 22 - 32 mmol/L   Glucose, Bld 101 (H) 65 - 99 mg/dL   BUN 8 6 - 20 mg/dL    Creatinine, Ser 4.09 0.61 - 1.24 mg/dL   Calcium 9.0 8.9 - 81.1 mg/dL   GFR calc non Af Amer >60 >60 mL/min   GFR calc Af Amer >60 >60 mL/min   Anion gap 9 5 - 15  Basic metabolic panel  Result Value Ref Range   Sodium 137 135 - 145 mmol/L   Potassium 3.3 (L) 3.5 - 5.1 mmol/L   Chloride 107 101 - 111 mmol/L   CO2 24 22 - 32 mmol/L   Glucose, Bld 119 (H) 65 - 99 mg/dL   BUN 8 6 - 20 mg/dL   Creatinine, Ser 9.14 0.61 - 1.24 mg/dL   Calcium 8.3 (L) 8.9 - 10.3 mg/dL   GFR calc non Af Amer >60 >60 mL/min   GFR calc Af Amer >60 >60 mL/min   Anion gap 6 5 - 15  CBC  Result Value Ref Range   WBC 8.2 4.0 - 10.5 K/uL   RBC 4.35 4.22 - 5.81 MIL/uL   Hemoglobin 13.7 13.0 - 17.0 g/dL   HCT 78.2 95.6 - 21.3 %   MCV 90.8 78.0 - 100.0 fL   MCH 31.5 26.0 - 34.0 pg   MCHC 34.7 30.0 - 36.0 g/dL   RDW 08.6 57.8 - 46.9 %   Platelets 301 150 - 400 K/uL  Basic metabolic panel  Result Value Ref Range   Sodium 137 135 - 145 mmol/L   Potassium 3.7 3.5 - 5.1 mmol/L   Chloride 104 101 - 111 mmol/L   CO2 26 22 - 32 mmol/L   Glucose, Bld 93 65 - 99 mg/dL   BUN 10 6 - 20 mg/dL   Creatinine, Ser 6.29 0.61 - 1.24 mg/dL   Calcium 8.8 (L) 8.9 - 10.3 mg/dL   GFR calc non Af Amer >60 >60 mL/min   GFR calc Af Amer >60 >60 mL/min   Anion gap 7 5 - 15  CBC  Result Value Ref Range   WBC 8.0 4.0 - 10.5 K/uL   RBC 4.80 4.22 - 5.81 MIL/uL   Hemoglobin 15.1 13.0 - 17.0 g/dL   HCT 52.8 41.3 - 24.4 %   MCV 90.2 78.0 - 100.0 fL   MCH 31.5 26.0 - 34.0 pg   MCHC 34.9 30.0 - 36.0 g/dL   RDW 01.0 27.2 - 53.6 %   Platelets 300 150 - 400 K/uL  I-Stat CG4 Lactic Acid, ED  Result Value Ref Range   Lactic Acid, Venous 2.15 (HH) 0.5 - 1.9 mmol/L  I-Stat CG4 Lactic Acid, ED  Result Value Ref Range   Lactic Acid, Venous 1.17 0.5 - 1.9 mmol/L   Scheduled Meds: . aspirin EC  81 mg Oral Daily  . enoxaparin (LOVENOX) injection  40 mg Subcutaneous Q24H  . levothyroxine  50 mcg Oral QAC breakfast   Continuous  Infusions:  Principal Problem:   Cellulitis of left lower extremity Active Problems:   Cellulitis   LOS: 3 days

## 2017-01-02 DIAGNOSIS — I999 Unspecified disorder of circulatory system: Secondary | ICD-10-CM

## 2017-01-02 NOTE — Progress Notes (Signed)
PROGRESS NOTE  Ian Wong [id number]:[id number] DOB: Oct 15, 1961 DOA: 12/29/2016 PCP: No PCP Per Patient  Brief Narrative: 56 year old man presented with ongoing left leg infection, failing to improve with 3 rounds of oral antibiotics as an outpatient. Admitted for refractory cellulitis.  Assessment/Plan 1. Critical BLE ischemia with poorly healing wounds LLE.  I consulted vascular surgery they feel that patient may need revascularization surgery.  CTA aorta with B/L Le runoff ordered and does show significant disease.  I received a text from Dr. Earlene Plater yesterday evening around 8pm and he is saying that he is not coming back to this hospital except for call situations and I should transfer patient to [facility].  I called and spoke with nurse for Dr. Arbie Cookey (he is operating).  He will look over patient's case and call me back later today with his recommendations.  I updated patient about the situation at the bedside.  I think his poorly healing wounds are related to poor perfusion.  Continue topical wound care.  Appreciate WOC nurse recommendations.  2. Shallow ulcer left lateral lower leg - as above 3. Blister over the dorsum of the foot. 4. Tobacco abuse - counseled at bedside and patient is highly motivated to quit at this time, he declined nicotine patch.   DVT prophylaxis: Lovenox Code Status: full code Family Communication: none Disposition Plan: Tawanna Solo  MD  [facility] Direct contact: [phone] --Via amion app OR  --[email or web address]; password [email or web address]  7PM-7AM contact night coverage as above  01/02/2017, 8:57 AM  LOS: 4 days   Consultants:  Vascular surgery  Procedures:  pending  Antimicrobials:  Vancomycin 1/29 >>1/31  Interval history/Subjective: Pt without complaints today.      Objective: Vitals:   01/01/17 0622 01/01/17 1751 01/01/17 2228 01/02/17 0500  BP: (!) 135/56 (!) 121/52 123/67 130/60  Pulse: 80 81 75 72  Resp: 18 18 16 18   Temp: 97.7 F  (36.5 C) 98.1 F (36.7 C) 97.7 F (36.5 C) 98.6 F (37 C)  TempSrc: Oral  Oral Oral  SpO2: 96% 93% 95% 97%  Weight: 81.4 kg (179 lb 8 oz)   82.3 kg (181 lb 7 oz)  Height:        Intake/Output Summary (Last 24 hours) at 01/02/17 0857 Last data filed at 01/01/17 1958  Gross per 24 hour  Intake              480 ml  Output             2000 ml  Net            -1520 ml     Filed Weights   12/31/16 0400 01/01/17 0622 01/02/17 0500  Weight: 82.2 kg (181 lb 4.8 oz) 81.4 kg (179 lb 8 oz) 82.3 kg (181 lb 7 oz)   Exam:    Constitutional: Appears calm, comfortable. NAD.   Cardiovascular: regular rate and rhythm. No murmur, rub or gallop.  Respiratory: clear to auscultation bilaterally. No wheezes, rales or rhonchi. Normal respiratory effort.   Skin. Shallow ulcer over the left lateral lower leg proximally 2 cm, ovoid. No exudate noted.  Foot is warm, dry.  Minimal surrounding erythema. Blister over the dorsum of the foot, tender to palpation. No exudate. No break in skin. No fluctuance. Pedal edema noted with surrounding erythema.  Results for orders placed or performed during the hospital encounter of 12/29/16  Blood culture (routine x 2)  Result Value Ref Range   Specimen  Description RIGHT ANTECUBITAL    Special Requests BOTTLES DRAWN AEROBIC AND ANAEROBIC 6CC EACH    Culture NO GROWTH 3 DAYS    Report Status PENDING   Blood culture (routine x 2)  Result Value Ref Range   Specimen Description BLOOD    Special Requests NONE    Culture NO GROWTH 3 DAYS    Report Status PENDING   CBC with Differential  Result Value Ref Range   WBC 10.2 4.0 - 10.5 K/uL   RBC 4.84 4.22 - 5.81 MIL/uL   Hemoglobin 15.3 13.0 - 17.0 g/dL   HCT 16.1 09.6 - 04.5 %   MCV 89.7 78.0 - 100.0 fL   MCH 31.6 26.0 - 34.0 pg   MCHC 35.3 30.0 - 36.0 g/dL   RDW 40.9 81.1 - 91.4 %   Platelets 311 150 - 400 K/uL   Neutrophils Relative % 65 %   Neutro Abs 6.6 1.7 - 7.7 K/uL   Lymphocytes Relative 19 %    Lymphs Abs 1.9 0.7 - 4.0 K/uL   Monocytes Relative 15 %   Monocytes Absolute 1.5 (H) 0.1 - 1.0 K/uL   Eosinophils Relative 1 %   Eosinophils Absolute 0.1 0.0 - 0.7 K/uL   Basophils Relative 0 %   Basophils Absolute 0.0 0.0 - 0.1 K/uL  Basic metabolic panel  Result Value Ref Range   Sodium 133 (L) 135 - 145 mmol/L   Potassium 3.7 3.5 - 5.1 mmol/L   Chloride 99 (L) 101 - 111 mmol/L   CO2 25 22 - 32 mmol/L   Glucose, Bld 101 (H) 65 - 99 mg/dL   BUN 8 6 - 20 mg/dL   Creatinine, Ser 7.82 0.61 - 1.24 mg/dL   Calcium 9.0 8.9 - 95.6 mg/dL   GFR calc non Af Amer >60 >60 mL/min   GFR calc Af Amer >60 >60 mL/min   Anion gap 9 5 - 15  Basic metabolic panel  Result Value Ref Range   Sodium 137 135 - 145 mmol/L   Potassium 3.3 (L) 3.5 - 5.1 mmol/L   Chloride 107 101 - 111 mmol/L   CO2 24 22 - 32 mmol/L   Glucose, Bld 119 (H) 65 - 99 mg/dL   BUN 8 6 - 20 mg/dL   Creatinine, Ser 2.13 0.61 - 1.24 mg/dL   Calcium 8.3 (L) 8.9 - 10.3 mg/dL   GFR calc non Af Amer >60 >60 mL/min   GFR calc Af Amer >60 >60 mL/min   Anion gap 6 5 - 15  CBC  Result Value Ref Range   WBC 8.2 4.0 - 10.5 K/uL   RBC 4.35 4.22 - 5.81 MIL/uL   Hemoglobin 13.7 13.0 - 17.0 g/dL   HCT 08.6 57.8 - 46.9 %   MCV 90.8 78.0 - 100.0 fL   MCH 31.5 26.0 - 34.0 pg   MCHC 34.7 30.0 - 36.0 g/dL   RDW 62.9 52.8 - 41.3 %   Platelets 301 150 - 400 K/uL  Basic metabolic panel  Result Value Ref Range   Sodium 137 135 - 145 mmol/L   Potassium 3.7 3.5 - 5.1 mmol/L   Chloride 104 101 - 111 mmol/L   CO2 26 22 - 32 mmol/L   Glucose, Bld 93 65 - 99 mg/dL   BUN 10 6 - 20 mg/dL   Creatinine, Ser 2.44 0.61 - 1.24 mg/dL   Calcium 8.8 (L) 8.9 - 10.3 mg/dL   GFR calc non Af Amer >60 >60  mL/min   GFR calc Af Amer >60 >60 mL/min   Anion gap 7 5 - 15  CBC  Result Value Ref Range   WBC 8.0 4.0 - 10.5 K/uL   RBC 4.80 4.22 - 5.81 MIL/uL   Hemoglobin 15.1 13.0 - 17.0 g/dL   HCT 78.243.3 95.639.0 - 21.352.0 %   MCV 90.2 78.0 - 100.0 fL   MCH 31.5  26.0 - 34.0 pg   MCHC 34.9 30.0 - 36.0 g/dL   RDW 08.612.9 57.811.5 - 46.915.5 %   Platelets 300 150 - 400 K/uL  I-Stat CG4 Lactic Acid, ED  Result Value Ref Range   Lactic Acid, Venous 2.15 (HH) 0.5 - 1.9 mmol/L  I-Stat CG4 Lactic Acid, ED  Result Value Ref Range   Lactic Acid, Venous 1.17 0.5 - 1.9 mmol/L   Scheduled Meds: . aspirin EC  81 mg Oral Daily  . enoxaparin (LOVENOX) injection  40 mg Subcutaneous Q24H  . levothyroxine  50 mcg Oral QAC breakfast  . senna-docusate  2 tablet Oral QHS   Continuous Infusions:  Principal Problem:   Cellulitis of left lower extremity Active Problems:   Cellulitis   LOS: 4 days

## 2017-01-02 NOTE — Progress Notes (Signed)
[facility] Report called to LupusLonnie, RN on 5W. No distress noted on transfer.

## 2017-01-02 NOTE — Progress Notes (Signed)
01/02/2017 10:29 AM  Progress Note  I received a call from Dr. Arbie CookeyEarly after he was able to review patient's films and care.  The patient does need surgery and he is willing to see the [facility].  I will get patient transferred over to med[facility].  Dr. Arbie CookeyEarly says that he is on call this weekend and he will see the patient and go over his surgical options with him.  In addition, he asked that we get cardiology consultation for pre-op clearance as this is going to be a more extensive surgery involving the aorta. I called patient placement and notified them of patient's transfer and updated sign out notes.     Maryln Manuel. Drayden Lukas, MD

## 2017-01-03 ENCOUNTER — Encounter (HOSPITAL_COMMUNITY): Payer: Self-pay | Admitting: Internal Medicine

## 2017-01-03 DIAGNOSIS — E785 Hyperlipidemia, unspecified: Secondary | ICD-10-CM

## 2017-01-03 DIAGNOSIS — L03119 Cellulitis of unspecified part of limb: Secondary | ICD-10-CM

## 2017-01-03 DIAGNOSIS — I998 Other disorder of circulatory system: Secondary | ICD-10-CM

## 2017-01-03 DIAGNOSIS — I739 Peripheral vascular disease, unspecified: Secondary | ICD-10-CM | POA: Diagnosis present

## 2017-01-03 DIAGNOSIS — L03116 Cellulitis of left lower limb: Secondary | ICD-10-CM

## 2017-01-03 DIAGNOSIS — Z01810 Encounter for preprocedural cardiovascular examination: Secondary | ICD-10-CM

## 2017-01-03 DIAGNOSIS — I70213 Atherosclerosis of native arteries of extremities with intermittent claudication, bilateral legs: Secondary | ICD-10-CM

## 2017-01-03 DIAGNOSIS — I70229 Atherosclerosis of native arteries of extremities with rest pain, unspecified extremity: Secondary | ICD-10-CM | POA: Diagnosis present

## 2017-01-03 DIAGNOSIS — I70242 Atherosclerosis of native arteries of left leg with ulceration of calf: Secondary | ICD-10-CM

## 2017-01-03 DIAGNOSIS — I70244 Atherosclerosis of native arteries of left leg with ulceration of heel and midfoot: Secondary | ICD-10-CM

## 2017-01-03 LAB — TSH: TSH: 5.333 u[IU]/mL — AB (ref 0.350–4.500)

## 2017-01-03 LAB — CULTURE, BLOOD (ROUTINE X 2)
Culture: NO GROWTH
Culture: NO GROWTH

## 2017-01-03 LAB — LIPID PANEL
CHOL/HDL RATIO: 6.1 ratio
CHOLESTEROL: 172 mg/dL (ref 0–200)
HDL: 28 mg/dL — ABNORMAL LOW (ref >40)
LDL Cholesterol: 102 mg/dL — ABNORMAL HIGH (ref 0–99)
Triglycerides: 210 mg/dL — ABNORMAL HIGH (ref <150)
VLDL: 42 mg/dL — ABNORMAL HIGH (ref 0–40)

## 2017-01-03 MED ORDER — ATORVASTATIN CALCIUM 40 MG PO TABS
40.0000 mg | ORAL_TABLET | Freq: Every day | ORAL | Status: DC
  Administered 2017-01-03 – 2017-01-06 (×4): 40 mg via ORAL
  Filled 2017-01-03 (×4): qty 1

## 2017-01-03 MED ORDER — CEFAZOLIN SODIUM-DEXTROSE 2-4 GM/100ML-% IV SOLN
2.0000 g | Freq: Three times a day (TID) | INTRAVENOUS | Status: DC
  Administered 2017-01-03 – 2017-01-15 (×35): 2 g via INTRAVENOUS
  Filled 2017-01-03 (×38): qty 100

## 2017-01-03 NOTE — Progress Notes (Signed)
[facility]                                                                              Patient Demographics  Ian HumbleRoger Wong, is a 56 y.o. male, DOB - 05/28/1961, [id number]  Admit date - 12/29/2016   Admitting Physician Haydee Monicaachal A David, MD  Outpatient Primary MD for the patient is No PCP Per Patient  Outpatient specialists:   LOS - 5  days    Chief Complaint  Patient presents with  . Wound Infection       Brief summary   Patient is a 56 year old male with no pertinent past medical history presented with infection in his left leg, had been on 3 rounds of antibiotics. Patient was admitted at [facility]. Patient reported that it all started with spider bite to his calf area and had a wound there and now had progressed to wound to his foot and redness from his foot going up his calf. Patient denied any prior history of vascular issues or diabetes. Patient was found to have poorly healing wounds of the left lower extremity with critical bilateral lower extremity ischemia. Vascular surgery was consulted. CT angiogram of aorta with bilateral lower extremity runoff showed significant disease. Vascular surgery [facility].  Assessment & Plan    Principal Problem:   Cellulitis of left lower extremity, Refractory due to critical left lower ischemia - Patient was admitted with refractory cellulitis of the left lower extremity - He was initially placed on IV vancomycin, CT angiogram of aorta with bilateral lower ext runoff showed significant disease  - Vascular surgery was consulted, patient seen by Dr. Arbie CookeyEarly this morning, recommended aortobifemoral bypass - Dr Early requested for cardiac evaluation for preoperative risk stratification, spoke with Dr. Shirlee LatchMclean recommended a 2-D echo, which I have ordered. Currently no chest pain or any cardiac symptoms, no prior cardiac workup, no stress test in the past. - Continue aspirin - Obtain  lipid panel, hemoglobin A1c - d/w Dr Arbie CookeyEarly, No need of IV heparin drip, keep on IV Ancef for now, plan on surgery early next week   Active Problems: Hypothyroidism - Continue levothyroxine, obtain TSH  Tobacco abuse - Counseled strongly against tobacco cessation, declined a nicotine patch   Code Status: full code  DVT Prophylaxis:  Lovenox Family Communication: Discussed in detail with the patient, all imaging results, lab results explained to the patient   Disposition Plan:   Time Spent in minutes   25 minutes  Procedures:   CTA aorta   Consultants:   Vascular surgery Cardiology  Antimicrobials:   Vancomycin 1/29 > 1/31   Medications  Scheduled Meds: . aspirin EC  81 mg Oral Daily  . enoxaparin (LOVENOX) injection  40 mg Subcutaneous Q24H  . levothyroxine  50 mcg Oral QAC breakfast  . senna-docusate  2 tablet Oral QHS   Continuous Infusions: PRN Meds:.acetaminophen, ondansetron **OR** ondansetron (ZOFRAN) IV, oxyCODONE, polyethylene glycol   Antibiotics   Anti-infectives    Start     Dose/Rate Route Frequency Ordered Stop   12/30/16 1600  vancomycin (VANCOCIN) 1,500 mg in sodium chloride 0.9 %  500 mL IVPB  Status:  Discontinued     1,500 mg 250 mL/hr over 120 Minutes Intravenous Every 12 hours 12/30/16 0834 12/31/16 1413   12/30/16 0800  vancomycin (VANCOCIN) IVPB 1000 mg/200 mL premix  Status:  Discontinued     1,000 mg 200 mL/hr over 60 Minutes Intravenous  Once 12/30/16 0134 12/30/16 0834   12/29/16 1930  vancomycin (VANCOCIN) IVPB 1000 mg/200 mL premix     1,000 mg 200 mL/hr over 60 Minutes Intravenous  Once 12/29/16 1927 12/29/16 2110        Subjective:   Ian Wong was seen and examined today. comfortable, eating breakfast, denies any complaints. No fevers. Patient denies dizziness, chest pain, shortness of breath, abdominal pain, N/V/D/C, new weakness, numbess, tingling. No acute events overnight.    Objective:   Vitals:   01/02/17 1848  01/02/17 2239 01/03/17 0352 01/03/17 0640  BP: 121/61 (!) 147/67  115/65  Pulse: 95 86  77  Resp: 17 19  19   Temp: 98.4 F (36.9 C) 98 F (36.7 C)  97.9 F (36.6 C)  TempSrc: Oral Oral  Oral  SpO2: 95% 98%  94%  Weight:   82.3 kg (181 lb 8 oz)   Height:        Intake/Output Summary (Last 24 hours) at 01/03/17 1044 Last data filed at 01/02/17 2300  Gross per 24 hour  Intake              840 ml  Output                0 ml  Net              840 ml     Wt Readings from Last 3 Encounters:  01/03/17 82.3 kg (181 lb 8 oz)  11/28/14 81.6 kg (180 lb)  01/18/14 70.3 kg (155 lb)     Exam  General: Alert and oriented x 3, NAD  HEENT:  PERRLA, EOMI, Anicteric Sclera, mucous membranes moist.   Neck: Supple, no JVD, no masses  Cardiovascular: S1 S2 auscultated, no rubs, murmurs or gallops. Regular rate and rhythm.  Respiratory: Clear to auscultation bilaterally, no wheezing, rales or rhonchi  Gastrointestinal: Soft, nontender, nondistended, + bowel sounds  Ext: no cyanosis clubbing or edema  Neuro: AAOx3, Cr N's II- XII. Strength 5/5 upper and lower extremities bilaterally  Skin: No rashes  Psych: Normal affect and demeanor, alert and oriented x3    Data Reviewed:  I have personally reviewed following labs and imaging studies  Micro Results Recent Results (from the past 240 hour(s))  Blood culture (routine x 2)     Status: None (Preliminary result)   Collection Time: 12/29/16  3:43 PM  Result Value Ref Range Status   Specimen Description RIGHT ANTECUBITAL  Final   Special Requests BOTTLES DRAWN AEROBIC AND ANAEROBIC 6CC EACH  Final   Culture NO GROWTH 4 DAYS  Final   Report Status PENDING  Incomplete  Blood culture (routine x 2)     Status: None (Preliminary result)   Collection Time: 12/29/16  7:57 PM  Result Value Ref Range Status   Specimen Description BLOOD  Final   Special Requests NONE  Final   Culture NO GROWTH 4 DAYS  Final   Report Status PENDING   Incomplete    Radiology Reports Ct Angio Ao+bifem W & Or Wo Contrast  Result Date: 12/31/2016 CLINICAL DATA:  Bilateral lower extremity nonhealing wounds, claudication, peripheral vascular disease EXAM: CT ANGIOGRAPHY  OF ABDOMINAL AORTA WITH ILIOFEMORAL RUNOFF TECHNIQUE: Multidetector CT imaging of the abdomen, pelvis and lower extremities was performed using the standard protocol during bolus administration of intravenous contrast. Multiplanar CT image reconstructions and MIPs were obtained to evaluate the vascular anatomy. CONTRAST:  150 cc Isovue 370 COMPARISON:  01/18/2014 FINDINGS: VASCULAR Aorta: Moderate wall thickening and calcific atherosclerosis of the aorta, most pronounced in the infrarenal segment. Negative for aneurysm or aortic occlusion. No dissection or retroperitoneal hemorrhage. Celiac: Patent origin. Splenic, left gastric, hepatic branches are also patent. SMA: Atherosclerotic origin with mild ostial narrowing, less than 50%. SMA trunk remains patent in the mesentery. Renals: Main renal arteries and accessory right renal artery to the upper pole all remain patent with mild atherosclerotic origins. IMA: Atherosclerotic origin but remains patent off of the distal aorta anteriorly. RIGHT Lower Extremity Inflow: Heavily calcified right iliac system. Right common and external iliac arteries remain patent with mild luminal narrowings and small caliber but no occlusion. 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 Outflow: Right common femoral artery is atherosclerotic but patent. Right profunda femoral artery remains patent. Chronic occlusion of the right SFA. Right above knee popliteal artery is reconstituted but very small in caliber with calcific atherosclerosis and luminal narrowings. Right popliteal artery remains patent across the knee. Runoff: Dominant runoff vessel to the right lower extremity is the peroneal artery which reconstitutes the dorsalis pedis artery peripherally.  Anterior tibial and posterior tibial arteries are very small in caliber and diffusely diseased. LEFT Lower Extremity Inflow: Chronic occlusion of the left common, internal and external iliac arteries. Outflow: Left common femoral artery is reconstituted by inferior epigastric collateral pathways. Left profunda femoral artery remains patent. Left SFA demonstrates severe disease proximally with multifocal stenoses and a very small patent lumen throughout the thigh. However, left SFA appears to remain patent. Popliteal artery is small in caliber but patent across the left knee. Runoff: Below the knee, the tibioperoneal trunk is heavily disease. Dominant left lower extremity runoff is via the left posterior tibial artery into the foot. Left peroneal and anterior tibial arteries are diminutive in caliber and heavily disease. Veins: Venous imaging not performed. Review of the MIP images confirms the above findings. NON-VASCULAR Lower chest: Minor dependent bibasilar atelectasis. Normal heart size. No pericardial pleural effusion. Very large hiatal hernia. Hepatobiliary: No focal liver abnormality is seen. No gallstones, gallbladder wall thickening, or biliary dilatation. Pancreas: Unremarkable. No pancreatic ductal dilatation or surrounding inflammatory changes. Spleen: Spleen is normal in size. Calcified splenic granulomata evident. Adrenals/Urinary Tract: Adrenal glands are unremarkable. Kidneys are normal, without renal calculi, focal lesion, or hydronephrosis. Bladder is unremarkable. Stomach/Bowel: Large hiatal hernia again noted. Negative bowel obstruction, significant dilatation, ileus, or free air. Normal appendix. Diffuse colonic diverticulosis, most severe in the sigmoid and rectum. No acute inflammatory process. No free fluid or abscess. Lymphatic: No adenopathy. Reproductive: No acute finding. Prostate normal in size. Seminal vesicles unremarkable. Other: Small fat containing left inguinal hernia. Small  midline fat containing ventral hernia above the umbilicus. Small fat containing left umbilical hernia as well. Musculoskeletal: Minor diffuse thoracolumbar degenerative spondylosis. Facets are aligned. No compression fracture or acute osseous finding. IMPRESSION: VASCULAR Aortic atherosclerosis without aortic occlusion, dissection or aneurysm. 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 Right above knee popliteal artery is reconstituted via profunda femoral collaterals. Popliteal artery is diseased across the knee with preserved single vessel runoff  via the right peroneal artery. Left common femoral artery is reconstituted through the inferior epigastric pathways. Left profunda femoral artery remains patent. Severely diseased diffuse left SFA disease without occlusion but with multifocal tandem stenoses. Left popliteal artery is diseased but patent across the knee. Dominant single vessel runoff to the left lower extremity is the left posterior tibial artery. NON-VASCULAR Large hiatal hernia Splenic granulomata Colonic diverticulosis Fat containing ventral hernia, umbilical hernia, and left inguinal hernia. Electronically Signed   By: Judie Petit.  Shick M.D.   On: 12/31/2016 17:00   US Arterial Seg Single  Result Date: 12/30/2016 CLINICAL DATA:  Claudication right worse than left after 50 yards. Tobacco abuse. EXAM: NONINVASIVE PHYSIOLOGIC VASCULAR STUDY OF BILATERAL LOWER EXTREMITIES TECHNIQUE: Evaluation of both lower extremities were performed at rest, including calculation of ankle-brachial indices with single level Doppler, pressure and pulse volume recording. COMPARISON:  None. 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 Should the patient fail conservative treatment, consider CTA runoff (higher spatial resolution) or MRA runoff (no radiation risk, can be performed noncontrast in the setting of renal dysfunction) to better define the site and nature of arterial occlusive disease and delineate treatment options. Electronically Signed   By: Corlis Leak M.D.   On: 12/30/2016 14:21   Dg Foot Complete Left  Result Date: 12/29/2016 CLINICAL DATA:  Left foot wound, pain, swelling and redness. EXAM: LEFT FOOT - COMPLETE 3+ VIEW COMPARISON:  None available FINDINGS: Left first MTP joint demonstrates degenerative osteoarthritis with associated hallux valgus deformity, joint space loss, sclerosis and bony spurring. No acute osseous finding or fracture. Dorsal foot soft tissue swelling on the lateral view. No visualized radiopaque or metallic foreign body. No plain radiographic evidence of osseous destruction or bone loss. 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 Electronically Signed   By: Judie Petit.  Shick M.D.   On: 12/29/2016 20:05    Lab Data:  CBC:  Recent Labs Lab 12/29/16 1543 12/30/16 0445 01/01/17 0625  WBC 10.2 8.2 8.0  NEUTROABS 6.6  --   --   HGB 15.3 13.7 15.1  HCT 43.4 39.5 43.3  MCV 89.7 90.8 90.2  PLT 311 301 300   Basic Metabolic Panel:  Recent Labs Lab 12/29/16 1543 12/30/16 0445 01/01/17 0625  NA 133* 137 137  K 3.7 3.3* 3.7  CL 99* 107 104  CO2 25 24 26   GLUCOSE 101* 119* 93  BUN 8 8 10   CREATININE 0.96 0.81 0.85  CALCIUM 9.0 8.3* 8.8*   GFR: Estimated Creatinine Clearance: 105.8 mL/min (by C-G formula based on SCr of 0.85 mg/dL). Liver Function Tests: No results for input(s): AST, ALT, ALKPHOS, BILITOT, PROT, ALBUMIN in the last 168 hours. No results for input(s): LIPASE, AMYLASE in the last 168  hours. No results for input(s): AMMONIA in the last 168 hours. Coagulation Profile: No results for input(s): INR, PROTIME in the last 168 hours. Cardiac Enzymes: No results for input(s): CKTOTAL, CKMB, CKMBINDEX, TROPONINI in the last 168 hours. BNP (last 3 results) No results for input(s): PROBNP in the last 8760 hours. HbA1C: No results for input(s): HGBA1C in the last 72 hours. CBG: No results for input(s): GLUCAP in the last 168 hours. Lipid Profile: No results for input(s): CHOL, HDL,  LDLCALC, TRIG, CHOLHDL, LDLDIRECT in the last 72 hours. Thyroid Function Tests: No results for input(s): TSH, T4TOTAL, FREET4, T3FREE, THYROIDAB in the last 72 hours. Anemia Panel: No results for input(s): VITAMINB12, FOLATE, FERRITIN, TIBC, IRON, RETICCTPCT in the last 72 hours. Urine analysis:    Component Value Date/Time   COLORURINE YELLOW 01/18/2014 1830   APPEARANCEUR CLEAR 01/18/2014 1830   LABSPEC 1.020 01/18/2014 1830   PHURINE 6.0 01/18/2014 1830   GLUCOSEU NEGATIVE 01/18/2014 1830   HGBUR NEGATIVE 01/18/2014 1830   BILIRUBINUR NEGATIVE 01/18/2014 1830   KETONESUR NEGATIVE 01/18/2014 1830   PROTEINUR NEGATIVE 01/18/2014 1830   UROBILINOGEN 0.2 01/18/2014 1830   NITRITE NEGATIVE 01/18/2014 1830   LEUKOCYTESUR NEGATIVE 01/18/2014 1830     Aylana Hirschfeld M.D. [facility] 01/03/2017, 10:44 AM  Pager: [phone] Between 7am to 7pm - call Pager - [phone]  After 7pm go to [email or web address] - password [email or web address]  Call night coverage person covering after 7pm

## 2017-01-03 NOTE — Consult Note (Signed)
CONSULTATION NOTE  Reason for Consult: Preoperative risk assessment  Requesting Physician: Dr. Arbie Cookey  Cardiologist: None (NEW)  HPI: This is a 56 y.o. male with a past medical history significant for tobacco and alcohol use, recurrent left leg infection and hypothyroidism on levothyroxine. He was admitted and placed on vancomycin for a presumed cellulitis. He was transferred from [facility] for further evaluation of critical limb ischemia. Dr. Arbie Cookey evaluated him and noted a 3 year history of progressive claudication. A CT angiogram demonstrated extensive aortic atherosclerosis, chronic occlusion of both iliac arteries, complete SFA occlusion with collateralization. Dr. Arbie Cookey is planning aortobifemoral bypass this Wednesday. Ian Wong denies any chest pain or dyspnea with exertion. He has been physically active and can walk up a flight of stairs without difficulty.  PMHx:  History reviewed. No pertinent past medical history. Past Surgical History:  Procedure Laterality Date  . I&D EXTREMITY Left 11/28/2014   Procedure: IRRIGATION AND DEBRIDEMENT;  REPAIR OF CRUSH INJURY;  Surgeon: Betha Loa, MD;  Location: [facility] OR;  Service: Orthopedics;  Laterality: Left;    FAMHx: Family History  Problem Relation Age of Onset  . COPD Mother   . Other Brother     SOCHx:  reports that he has been smoking.  He has been smoking about 0.50 packs per day. He has never used smokeless tobacco. He reports that he drinks alcohol. He reports that he does not use drugs.  ALLERGIES: No Known Allergies  ROS: Pertinent items noted in HPI and remainder of comprehensive ROS otherwise negative.  HOSPITAL MEDICATIONS: Prior to Admission:  Prescriptions Prior to Admission  Medication Sig Dispense Refill Last Dose  . aspirin EC 81 MG tablet Take 81 mg by mouth daily.   12/29/2016 at Unknown time  . levothyroxine (SYNTHROID, LEVOTHROID) 50 MCG tablet Take 50 mcg by mouth daily.  0 12/29/2016 at Unknown  time  . naproxen sodium (ALEVE) 220 MG tablet Take 220-440 mg by mouth daily as needed (for pain).   12/29/2016 at Unknown time   VITALS: Blood pressure (!) 102/49, pulse 91, temperature 98.4 F (36.9 C), resp. rate 18, height 5\' 11"  (1.803 m), weight 181 lb 8 oz (82.3 kg), SpO2 96 %.  PHYSICAL EXAM: General appearance: alert and no distress Neck: no carotid bruit and no JVD Lungs: clear to auscultation bilaterally Heart: regular rate and rhythm Abdomen: soft, non-tender; bowel sounds normal; no masses,  no organomegaly Extremities: bandaged LLE and no edema Pulses: decreased Skin: Skin color, texture, turgor normal. No rashes or lesions Neurologic: Grossly normal Psych: Pleasant  LABS: Results for orders placed or performed during the hospital encounter of 12/29/16 (from the past 48 hour(s))  Lipid panel     Status: Abnormal   Collection Time: 01/03/17 11:51 AM  Result Value Ref Range   Cholesterol 172 0 - 200 mg/dL   Triglycerides 956 (H) <150 mg/dL   HDL 28 (L) >21 mg/dL   Total CHOL/HDL Ratio 6.1 RATIO   VLDL 42 (H) 0 - 40 mg/dL   LDL Cholesterol 308 (H) 0 - 99 mg/dL    Comment:        Total Cholesterol/HDL:CHD Risk Coronary Heart Disease Risk Table                     Men   Women  1/2 Average Risk   3.4   3.3  Average Risk       5.0   4.4  2 X Average Risk  9.6   7.1  3 X Average Risk  23.4   11.0        Use the calculated Patient Ratio above and the CHD Risk Table to determine the patient's CHD Risk.        ATP III CLASSIFICATION (LDL):  <100     mg/dL   Optimal  [id number]  mg/dL   Near or Above                    Optimal  130-159  mg/dL   Borderline  [id number]  mg/dL   High  >811>190     mg/dL   Very High   TSH     Status: Abnormal   Collection Time: 01/03/17 11:51 AM  Result Value Ref Range   TSH 5.333 (H) 0.350 - 4.500 uIU/mL    Comment: Performed by a 3rd Generation assay with a functional sensitivity of <=0.01 uIU/mL.    IMAGING: No results  found.  HOSPITAL DIAGNOSES: Principal Problem:   Cellulitis of left lower extremity Active Problems:   Cellulitis   PAD (peripheral artery disease) (HCC)   Critical lower limb ischemia   IMPRESSION: 1. Low risk patient for a high risk vascular procedure 2. PAD with claudication and critical limb ischemia 3. Dyslipidemia 4. Tobacco abuse  RECOMMENDATION: 1. Ian Wong denies chest pain or worsening dyspnea. Despite his claudication, he is active and regularly performs >4 METS of activity. There is no clear indication for preoperative testing at this time.  He had dyslipidemia with LDL of 102 - would recommend a high potency statin with goal LDL <70. Smoking cessation is recommended. BP has been labile - if there is bp room, consider adding low dose b-blocker such as metoprolol tartrate 12.5 mg BID for perioperative benefit.  Thanks for the consultation.  Time Spent Directly with Patient: 45 minutes  Ian NoseKenneth C. Hilty, MD, [facility] CenterFACC Attending Cardiologist [facility]  Ian Wong 01/03/2017, 3:07 PM

## 2017-01-03 NOTE — Consult Note (Signed)
[facility] [place]  Patient name: Ian Wong MRN: [id number] DOB: 07/14/1961 Sex: male  REASON FOR CONSULT: Severe bilateral lower extremity arterial insufficiency with tissue loss left leg  HPI: Ian Wong is a 56 y.o. male, who is transferred from [facility] for further evaluation and treatment of critical limb ischemia of his left leg. He is a very pleasant 56 year old gentleman who reports at least a three-year history of severe progressive claudication symptoms. This is a in his right and left leg. He has recently developed rest pain with the need for dependency for relief of pain in his left foot at night. Over the past several months he has had a progressive ulceration of his left lateral calf and left dorsum of his foot. He has had several episodes of surrounding erythema. He has had no similar tissue loss on his left leg. He does report that in the past his discomfort this Was relieved by rest but this is making it harder for him to do his usual activities. He denies any prior history of cardiac disease and no prior history of stroke. He does smoke cigarettes but has committed to quitting with this event understanding this is related to cigarette smoking. He does have a history of erectile dysfunction. No history of abdominal ischemia. Remote history of hernia repair but no other abdominal surgery.  History reviewed. No pertinent past medical history.  History reviewed. No pertinent family history.  SOCIAL HISTORY: Social History   Social History  . Marital status: Married    Spouse name: N/A  . Number of children: N/A  . Years of education: N/A   Occupational History  . Not on file.   Social History Main Topics  . Smoking status: Current Every Day Smoker    Packs/day: 0.50  . Smokeless tobacco: Never Used  . Alcohol use Yes     Comment: last drank etoh 1 1/2 months ago.  . Drug use: No  .  Sexual activity: Not on file   Other Topics Concern  . Not on file   Social History Narrative  . No narrative on file    No Known Allergies  Current Facility-Administered Medications  Medication Dose Route Frequency Provider Last Rate Last Dose  . acetaminophen (TYLENOL) tablet 650 mg  650 mg Oral Q6H PRN Standley Brookinganiel P Goodrich, MD   650 mg at 01/03/17 0509  . aspirin EC tablet 81 mg  81 mg Oral Daily Haydee Monicaachal A David, MD   81 mg at 01/03/17 0951  . enoxaparin (LOVENOX) injection 40 mg  40 mg Subcutaneous Q24H Haydee Monicaachal A David, MD   40 mg at 01/03/17 0053  . levothyroxine (SYNTHROID, LEVOTHROID) tablet 50 mcg  50 mcg Oral QAC breakfast Haydee Monicaachal A David, MD   50 mcg at 01/03/17 0950  . ondansetron (ZOFRAN) tablet 4 mg  4 mg Oral Q6H PRN Haydee Monicaachal A David, MD       Or  . ondansetron [facility](ZOFRAN) injection 4 mg  4 mg Intravenous Q6H PRN Haydee Monicaachal A David, MD      . oxyCODONE (Oxy IR/ROXICODONE) immediate release tablet 5 mg  5 mg Oral Q4H PRN Haydee Monicaachal A David, MD   5 mg at 01/03/17 0509  . polyethylene glycol (MIRALAX / GLYCOLAX) packet 17 g  17 g Oral Daily PRN Leda GauzeKaren J Kirby-Graham, NP   17 g at 01/01/17 2035  . senna-docusate (Senokot-S) tablet 2 tablet  2 tablet Oral QHS Leda GauzeKaren J Kirby-Graham, NP  2 tablet at 01/02/17 2219    REVIEW OF SYSTEMS:  [X]  denotes positive finding, [ ]  denotes negative finding Cardiac  Comments:  Chest pain or chest pressure:    Shortness of breath upon exertion:    Short of breath when lying flat:    Irregular heart rhythm:        Vascular    Pain in calf, thigh, or hip brought on by ambulation: x   Pain in feet at night that wakes you up from your sleep:  x   Blood clot in your veins:    Leg swelling:         Pulmonary    Oxygen at home:    Productive cough:     Wheezing:         Neurologic    Sudden weakness in arms or legs:     Sudden numbness in arms or legs:     Sudden onset of difficulty speaking or slurred speech:    Temporary loss of vision in one eye:       Problems with dizziness:         Gastrointestinal    Blood in stool:     Vomited blood:         Genitourinary    Burning when urinating:     Blood in urine:        Psychiatric    Major depression:         Hematologic    Bleeding problems:    Problems with blood clotting too easily:        Skin    Rashes or ulcers:        Constitutional    Fever or chills:      PHYSICAL EXAM: Vitals:   01/02/17 1848 01/02/17 2239 01/03/17 0352 01/03/17 0640  BP: 121/61 (!) 147/67  115/65  Pulse: 95 86  77  Resp: 17 19  19   Temp: 98.4 F (36.9 C) 98 F (36.7 C)  97.9 F (36.6 C)  TempSrc: Oral Oral  Oral  SpO2: 95% 98%  94%  Weight:   181 lb 8 oz (82.3 kg)   Height:        GENERAL: The patient is a well-nourished male, in no acute distress. The vital signs are documented above. CARDIOVASCULAR: Easily palpable radial pulses bilaterally. No femoral pulses and no distal pulses PULMONARY: There is good air exchange  ABDOMEN: Soft and non-tender  MUSCULOSKELETAL: There are no major deformities or cyanosis. NEUROLOGIC: No focal weakness or paresthesias are detected. SKIN: He does have a 1 x 2 cm open ulcer on the lateral aspect of his left calf with some debris at the base with poor granulation tissue. He does have a 3 cm eschar over the dorsum of his foot with no surrounding erythema PSYCHIATRIC: The patient has a normal affect.  DATA:  I reviewed his CT scan from outlying hospital from earlier this week. This shows extensive atherosclerotic disease throughout his pelvis and infrainguinal arteries. He has calcification and irregularity but no flow limitation in his aorta. He has a complete occlusion of his common iliac artery at its origin on the left with reconstitution of his femoral artery at the groin. Appears to have occlusion of his internal iliac arteries bilaterally. He does have patency but extreme irregularity and stenosis throughout his right common iliac and external iliac  artery. It appears that his right superficial femoral artery is occluded. His left superficial femoral artery has multiple areas of  subtotal occlusion but does have patency throughout its course with three-vessel runoff bilaterally  MEDICAL ISSUES: Had long discussion with the patient. Explained that this is limb threatening. 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 Explained the need for revascularization. Do not feel he is a candidate for a right to left femorofemoral bypass due to his extensive disease in his right iliac system. Recommend aortobifemoral bypass. Explained may need in for inguinal treatment in the future depending on his recovery after normalization of flow to his common femoral artery. In all likelihood will require reimplantation of his inferior mesenteric artery. Has very little flow to his pelvis related to his internal iliac occlusive disease. Will obtain cardiac evaluation for preoperative risk stratification. Tentatively plan surgery for this coming Wednesday.   Larina Earthly, MD FACS [facility] Tel [phone] Pager [phone]

## 2017-01-04 ENCOUNTER — Inpatient Hospital Stay (HOSPITAL_COMMUNITY): Payer: Self-pay

## 2017-01-04 DIAGNOSIS — Z01810 Encounter for preprocedural cardiovascular examination: Secondary | ICD-10-CM

## 2017-01-04 DIAGNOSIS — I7409 Other arterial embolism and thrombosis of abdominal aorta: Principal | ICD-10-CM

## 2017-01-04 LAB — BASIC METABOLIC PANEL
Anion gap: 7 (ref 5–15)
BUN: 15 mg/dL (ref 6–20)
CALCIUM: 8.9 mg/dL (ref 8.9–10.3)
CO2: 25 mmol/L (ref 22–32)
Chloride: 105 mmol/L (ref 101–111)
Creatinine, Ser: 0.99 mg/dL (ref 0.61–1.24)
GFR calc Af Amer: >60 mL/min (ref >60)
GFR calc non Af Amer: >60 mL/min (ref >60)
GLUCOSE: 109 mg/dL — AB (ref 65–99)
Potassium: 3.9 mmol/L (ref 3.5–5.1)
Sodium: 137 mmol/L (ref 135–145)

## 2017-01-04 LAB — CBC
HEMATOCRIT: 43.6 % (ref 39.0–52.0)
Hemoglobin: 15.2 g/dL (ref 13.0–17.0)
MCH: 31.4 pg (ref 26.0–34.0)
MCHC: 34.9 g/dL (ref 30.0–36.0)
MCV: 90.1 fL (ref 78.0–100.0)
PLATELETS: 291 10*3/uL (ref 150–400)
RBC: 4.84 MIL/uL (ref 4.22–5.81)
RDW: 12.8 % (ref 11.5–15.5)
WBC: 9.8 10*3/uL (ref 4.0–10.5)

## 2017-01-04 LAB — ECHOCARDIOGRAM COMPLETE
CHL CUP DOP CALC LVOT VTI: 14.7 cm
E/e' ratio: 3.52
Height: 71 in
LAVOL: 28.6 mL
LAVOLA4C: 22.9 mL
LAVOLIN: 14.2 mL/m2
LV E/e' medial: 3.52
LVEEAVG: 3.52
LVELAT: 13.1 cm/s
LVOT peak grad rest: 3 mmHg
LVOT peak vel: 81.3 cm/s
Lateral S' vel: 12.8 cm/s
MVPKAVEL: 67.9 m/s
MVPKEVEL: 46.1 m/s
RV TAPSE: 23.8 mm
TDI e' lateral: 13.1
TDI e' medial: 6.96
Weight: 2899.2 oz

## 2017-01-04 LAB — HEMOGLOBIN A1C
Hgb A1c MFr Bld: 5.4 % (ref 4.8–5.6)
Mean Plasma Glucose: 108 mg/dL

## 2017-01-04 MED ORDER — PERFLUTREN LIPID MICROSPHERE
1.0000 mL | INTRAVENOUS | Status: AC | PRN
  Administered 2017-01-04: 3 mL via INTRAVENOUS
  Filled 2017-01-04: qty 10

## 2017-01-04 NOTE — Progress Notes (Signed)
Subjective: Interval History: none.. Still reports a significant rest pain. Remains afebrile  Objective: Vital signs in last 24 hours: Temp:  [97.5 F (36.4 C)-98.7 F (37.1 C)] 97.5 F (36.4 C) (02/04 0515) Pulse Rate:  [76-91] 76 (02/04 0515) Resp:  [18] 18 (02/04 0515) BP: (102-129)/(49-62) 124/57 (02/04 0515) SpO2:  [96 %-98 %] 97 % (02/04 0515) Weight:  [181 lb 3.2 oz (82.2 kg)] 181 lb 3.2 oz (82.2 kg) (02/04 0515)  Intake/Output from previous day: 02/03 0701 - 02/04 0700 In: 340 [P.O.:240; IV Piggyback:100] Out: -  Intake/Output this shift: No intake/output data recorded.  Dressings intact to lower from any.  did not really examine.  Lab Results:  Recent Labs  01/04/17 0346  WBC 9.8  HGB 15.2  HCT 43.6  PLT 291   BMET  Recent Labs  01/04/17 0346  NA 137  K 3.9  CL 105  CO2 25  GLUCOSE 109*  BUN 15  CREATININE 0.99  CALCIUM 8.9    Studies/Results: Ct Angio Ao+bifem W & Or Wo Contrast  Result Date: 12/31/2016 CLINICAL DATA:  Bilateral lower extremity nonhealing wounds, claudication, peripheral vascular disease EXAM: CT ANGIOGRAPHY OF ABDOMINAL AORTA WITH ILIOFEMORAL RUNOFF TECHNIQUE: Multidetector CT imaging of the abdomen, pelvis and lower extremities was performed using the standard protocol during bolus administration of intravenous contrast. Multiplanar CT image reconstructions and MIPs were obtained to evaluate the vascular anatomy. CONTRAST:  150 cc Isovue 370 COMPARISON:  01/18/2014 FINDINGS: VASCULAR Aorta: Moderate wall thickening and calcific atherosclerosis of the aorta, most pronounced in the infrarenal segment. Negative for aneurysm or aortic occlusion. No dissection or retroperitoneal hemorrhage. Celiac: Patent origin. Splenic, left gastric, hepatic branches are also patent. SMA: Atherosclerotic origin with mild ostial narrowing, less than 50%. SMA trunk remains patent in the mesentery. Renals: Main renal arteries and accessory right renal artery  to the upper pole all remain patent with mild atherosclerotic origins. IMA: Atherosclerotic origin but remains patent off of the distal aorta anteriorly. RIGHT Lower Extremity Inflow: Heavily calcified right iliac system. Right common and external iliac arteries remain patent with mild luminal narrowings and small caliber but no occlusion. 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 Outflow: Right common femoral artery is atherosclerotic but patent. Right profunda femoral artery remains patent. Chronic occlusion of the right SFA. Right above knee popliteal artery is reconstituted but very small in caliber with calcific atherosclerosis and luminal narrowings. Right popliteal artery remains patent across the knee. Runoff: Dominant runoff vessel to the right lower extremity is the peroneal artery which reconstitutes the dorsalis pedis artery peripherally. Anterior tibial and posterior tibial arteries are very small in caliber and diffusely diseased. LEFT Lower Extremity Inflow: Chronic occlusion of the left common, internal and external iliac arteries. Outflow: Left common femoral artery is reconstituted by inferior epigastric collateral pathways. Left profunda femoral artery remains patent. Left SFA demonstrates severe disease proximally with multifocal stenoses and a very small patent lumen throughout the thigh. However, left SFA appears to remain patent. Popliteal artery is small in caliber but patent across the left knee. Runoff: Below the knee, the tibioperoneal trunk is heavily disease. Dominant left lower extremity runoff is via the left posterior tibial artery into the foot. Left peroneal and anterior tibial arteries are diminutive in caliber and heavily disease. Veins: Venous imaging not performed. Review of the MIP images confirms the above findings. NON-VASCULAR Lower chest: Minor dependent bibasilar atelectasis. Normal heart size. No pericardial pleural effusion. Very large hiatal hernia.  Hepatobiliary: No focal  liver abnormality is seen. No gallstones, gallbladder wall thickening, or biliary dilatation. Pancreas: Unremarkable. No pancreatic ductal dilatation or surrounding inflammatory changes. Spleen: Spleen is normal in size. Calcified splenic granulomata evident. Adrenals/Urinary Tract: Adrenal glands are unremarkable. Kidneys are normal, without renal calculi, focal lesion, or hydronephrosis. Bladder is unremarkable. Stomach/Bowel: Large hiatal hernia again noted. Negative bowel obstruction, significant dilatation, ileus, or free air. Normal appendix. Diffuse colonic diverticulosis, most severe in the sigmoid and rectum. No acute inflammatory process. No free fluid or abscess. Lymphatic: No adenopathy. Reproductive: No acute finding. Prostate normal in size. Seminal vesicles unremarkable. Other: Small fat containing left inguinal hernia. Small midline fat containing ventral hernia above the umbilicus. Small fat containing left umbilical hernia as well. Musculoskeletal: Minor diffuse thoracolumbar degenerative spondylosis. Facets are aligned. No compression fracture or acute osseous finding. IMPRESSION: VASCULAR Aortic atherosclerosis without aortic occlusion, dissection or aneurysm. 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 Right above knee popliteal artery is reconstituted via profunda femoral collaterals. Popliteal artery is diseased across the knee with preserved single vessel runoff via the right peroneal artery. Left common femoral artery is reconstituted through the inferior epigastric pathways. Left profunda femoral artery remains patent. Severely diseased diffuse left SFA disease without occlusion but with multifocal tandem stenoses. Left popliteal artery is diseased but patent across the knee. Dominant single vessel runoff to the left  lower extremity is the left posterior tibial artery. NON-VASCULAR Large hiatal hernia Splenic granulomata Colonic diverticulosis Fat containing ventral hernia, umbilical hernia, and left inguinal hernia. Electronically Signed   By: Judie PetitM.  Shick M.D.   On: 12/31/2016 17:00   [place] Arterial Seg Single  Result Date: 12/30/2016 CLINICAL DATA:  Claudication right worse than left after 50 yards. Tobacco abuse. EXAM: NONINVASIVE PHYSIOLOGIC VASCULAR STUDY OF BILATERAL LOWER EXTREMITIES TECHNIQUE: Evaluation of both lower extremities were performed at rest, including calculation of ankle-brachial indices with single level Doppler, pressure and pulse volume recording. COMPARISON:  None. 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 Should the patient fail conservative treatment, consider CTA runoff (higher spatial resolution) or MRA runoff (no radiation risk, can be performed noncontrast in the setting of renal dysfunction) to better define the site and nature of arterial occlusive disease and delineate treatment options. Electronically Signed   By: Corlis Leak  Hassell M.D.   On: 12/30/2016 14:21   Dg Foot Complete Left  Result Date: 12/29/2016 CLINICAL DATA:  Left foot wound, pain, swelling and redness. EXAM: LEFT FOOT - COMPLETE 3+ VIEW COMPARISON:  None available FINDINGS: Left first MTP joint demonstrates degenerative osteoarthritis with associated hallux valgus deformity, joint space loss, sclerosis and bony spurring. No acute osseous finding or fracture. Dorsal foot soft tissue swelling on the lateral view. No visualized radiopaque or metallic foreign body. No plain radiographic evidence of osseous destruction or bone loss. 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 Electronically Signed   By: Judie PetitM.  Shick M.D.   On: 12/29/2016 20:05   Anti-infectives: Anti-infectives    Start     Dose/Rate Route Frequency Ordered Stop   01/03/17 1400  ceFAZolin (ANCEF) IVPB 2g/100 mL premix     2 g  200 mL/hr over 30 Minutes Intravenous Every 8 hours 01/03/17 1058     12/30/16 1600  vancomycin (VANCOCIN) 1,500 mg in sodium chloride 0.9 % 500 mL IVPB  Status:  Discontinued     1,500 mg 250 mL/hr over 120 Minutes Intravenous Every 12 hours 12/30/16 0834 12/31/16 1413   12/30/16 0800  vancomycin (VANCOCIN) IVPB 1000 mg/200 mL premix  Status:  Discontinued     1,000 mg 200 mL/hr over 60 Minutes Intravenous  Once 12/30/16 0134 12/30/16 0834   12/29/16 1930  vancomycin (VANCOCIN) IVPB 1000 mg/200 mL premix     1,000 mg 200 mL/hr over 60 Minutes Intravenous  Once 12/29/16 1927 12/29/16 2110      Assessment/Plan: s/p Procedure(s): AORTOBIFEMORAL BYPASS GRAFT (Bilateral) Appreciate cardiology evaluation. I felt no further cardiac workup needed and ready for surgery. Cannot get on the schedule until Wednesday. I would be willing to accept transfer to my service since there are no other active medical issues aside from his lower extremity ischemia. Will defer this decision to the hospitalist service   LOS: 6 days   Novelle Addair 01/04/2017, 11:31 AM

## 2017-01-04 NOTE — Progress Notes (Signed)
  Echocardiogram 2D Echocardiogram has been performed.  Delcie RochENNINGTON, Tymothy Cass 01/04/2017, 1:15 PM

## 2017-01-04 NOTE — Progress Notes (Signed)
[facility]                                                                              Patient Demographics  Ian Wong, is a 56 y.o. male, DOB - 1961-05-20, NWG:[id number]  Admit date - 12/29/2016   Admitting Physician Haydee Monica, MD  Outpatient Primary MD for the patient is No PCP Per Patient  Outpatient specialists:   LOS - 6  days    Chief Complaint  Patient presents with  . Wound Infection       Brief summary   Patient is a 56 year old male with no pertinent past medical history presented with infection in his left leg, had been on 3 rounds of antibiotics. Patient was admitted at [facility]. Patient reported that it all started with spider bite to his calf area and had a wound there and now had progressed to wound to his foot and redness from his foot going up his calf. Patient denied any prior history of vascular issues or diabetes. Patient was found to have poorly healing wounds of the left lower extremity with critical bilateral lower extremity ischemia. Vascular surgery was consulted. CT angiogram of aorta with bilateral lower extremity runoff showed significant disease. Vascular surgery recommended transfer to [facility] for further workup.  Assessment & Plan    Principal Problem:   Cellulitis of left lower extremity, Refractory due to critical left lower ischemia - Patient was admitted with refractory cellulitis of the left lower extremity - He was initially placed on IV vancomycin, CT angiogram of aorta with bilateral lower ext runoff showed significant disease  - Vascular surgery was consulted, patient seen by Dr. Arbie Cookey, recommended aortobifemoral bypass -  Continue aspirin -LDL 102, started on statin - Continue IV Ancef  - Seen by cardiology, cleared for surgery.   Active Problems: Hypothyroidism - Continue levothyroxine, TSH 5.3  Tobacco abuse - Counseled strongly against tobacco cessation, declined a nicotine patch   Code  Status: full code  DVT Prophylaxis:  Lovenox Family Communication: Discussed in detail with the patient, all imaging results, lab results explained to the patient   Disposition Plan:   Time Spent in minutes   25 minutes  Procedures:   CTA aorta   Consultants:   Vascular surgery Cardiology  Antimicrobials:   Vancomycin 1/29 > 1/31   Medications  Scheduled Meds: . aspirin EC  81 mg Oral Daily  . atorvastatin  40 mg Oral q1800  .  ceFAZolin (ANCEF) IV  2 g Intravenous Q8H  . enoxaparin (LOVENOX) injection  40 mg Subcutaneous Q24H  . levothyroxine  50 mcg Oral QAC breakfast  . senna-docusate  2 tablet Oral QHS   Continuous Infusions: PRN Meds:.acetaminophen, ondansetron **OR** ondansetron (ZOFRAN) IV, oxyCODONE, polyethylene glycol   Antibiotics   Anti-infectives    Start     Dose/Rate Route Frequency Ordered Stop   01/03/17 1400  ceFAZolin (ANCEF) IVPB 2g/100 mL premix     2 g 200 mL/hr over 30 Minutes Intravenous Every 8 hours 01/03/17 1058     12/30/16 1600  vancomycin (VANCOCIN) 1,500 mg in sodium chloride 0.9 % 500 mL IVPB  Status:  Discontinued     1,500 mg 250 mL/hr over 120 Minutes Intravenous Every 12 hours 12/30/16 0834 12/31/16 1413   12/30/16 0800  vancomycin (VANCOCIN) IVPB 1000 mg/200 mL premix  Status:  Discontinued     1,000 mg 200 mL/hr over 60 Minutes Intravenous  Once 12/30/16 0134 12/30/16 0834   12/29/16 1930  vancomycin (VANCOCIN) IVPB 1000 mg/200 mL premix     1,000 mg 200 mL/hr over 60 Minutes Intravenous  Once 12/29/16 1927 12/29/16 2110        Subjective:   Ian Wong was seen and examined today.No complaints this morning, no fevers. Awaiting surgery early next week.  Patient denies dizziness, chest pain, shortness of breath, abdominal pain, N/V/D/C, new weakness, numbess, tingling. No acute events overnight.    Objective:   Vitals:   01/03/17 0640 01/03/17 1426 01/03/17 2121 01/04/17 0515  BP: 115/65 (!) 102/49 129/62 (!) 124/57    Pulse: 77 91 84 76  Resp: 19 18 18 18   Temp: 97.9 F (36.6 C) 98.4 F (36.9 C) 98.7 F (37.1 C) 97.5 F (36.4 C)  TempSrc: Oral   Oral  SpO2: 94% 96% 98% 97%  Weight:    82.2 kg (181 lb 3.2 oz)  Height:        Intake/Output Summary (Last 24 hours) at 01/04/17 1122 Last data filed at 01/03/17 1700  Gross per 24 hour  Intake              340 ml  Output                0 ml  Net              340 ml     Wt Readings from Last 3 Encounters:  01/04/17 82.2 kg (181 lb 3.2 oz)  11/28/14 81.6 kg (180 lb)  01/18/14 70.3 kg (155 lb)     Exam  General: Alert and oriented x 3, NAD  HEENT:    Neck:   Cardiovascular: S1 S2 clear, RRR  Respiratory: Clear to auscultation bilaterally, no wheezing, rales or rhonchi  Gastrointestinal: Soft, nontender, nondistended, + bowel sounds  Ext: no cyanosis clubbing or edema  Neuro: No new deficits  Skin: 1x2 open ulcer on the lateral aspect of left calf, 3 cm eschar on the dorsum of his left foot  Psych: Normal affect and demeanor, alert and oriented x3    Data Reviewed:  I have personally reviewed following labs and imaging studies  Micro Results Recent Results (from the past 240 hour(s))  Blood culture (routine x 2)     Status: None   Collection Time: 12/29/16  3:43 PM  Result Value Ref Range Status   Specimen Description RIGHT ANTECUBITAL  Final   Special Requests BOTTLES DRAWN AEROBIC AND ANAEROBIC [facility] EACH  Final   Culture NO GROWTH 5 DAYS  Final   Report Status 01/03/2017 FINAL  Final  Blood culture (routine x 2)     Status: None   Collection Time: 12/29/16  7:57 PM  Result Value Ref Range Status   Specimen Description BLOOD  Final   Special Requests NONE  Final   Culture NO GROWTH 5 DAYS  Final   Report Status 01/03/2017 FINAL  Final    Radiology Reports Ct Angio Ao+bifem W & Or Wo Contrast  Result Date: 12/31/2016 CLINICAL DATA:  Bilateral lower extremity nonhealing wounds, claudication, peripheral vascular  disease EXAM: CT ANGIOGRAPHY OF ABDOMINAL AORTA WITH ILIOFEMORAL RUNOFF TECHNIQUE: Multidetector  CT imaging of the abdomen, pelvis and lower extremities was performed using the standard protocol during bolus administration of intravenous contrast. Multiplanar CT image reconstructions and MIPs were obtained to evaluate the vascular anatomy. CONTRAST:  150 cc Isovue 370 COMPARISON:  01/18/2014 FINDINGS: VASCULAR Aorta: Moderate wall thickening and calcific atherosclerosis of the aorta, most pronounced in the infrarenal segment. Negative for aneurysm or aortic occlusion. No dissection or retroperitoneal hemorrhage. Celiac: Patent origin. Splenic, left gastric, hepatic branches are also patent. SMA: Atherosclerotic origin with mild ostial narrowing, less than 50%. SMA trunk remains patent in the mesentery. Renals: Main renal arteries and accessory right renal artery to the upper pole all remain patent with mild atherosclerotic origins. IMA: Atherosclerotic origin but remains patent off of the distal aorta anteriorly. RIGHT Lower Extremity Inflow: Heavily calcified right iliac system. Right common and external iliac arteries remain patent with mild luminal narrowings and small caliber but no occlusion. 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 Outflow: Right common femoral artery is atherosclerotic but patent. Right profunda femoral artery remains patent. Chronic occlusion of the right SFA. Right above knee popliteal artery is reconstituted but very small in caliber with calcific atherosclerosis and luminal narrowings. Right popliteal artery remains patent across the knee. Runoff: Dominant runoff vessel to the right lower extremity is the peroneal artery which reconstitutes the dorsalis pedis artery peripherally. Anterior tibial and posterior tibial arteries are very small in caliber and diffusely diseased. LEFT Lower Extremity Inflow: Chronic occlusion of the left common, internal and external iliac arteries.  Outflow: Left common femoral artery is reconstituted by inferior epigastric collateral pathways. Left profunda femoral artery remains patent. Left SFA demonstrates severe disease proximally with multifocal stenoses and a very small patent lumen throughout the thigh. However, left SFA appears to remain patent. Popliteal artery is small in caliber but patent across the left knee. Runoff: Below the knee, the tibioperoneal trunk is heavily disease. Dominant left lower extremity runoff is via the left posterior tibial artery into the foot. Left peroneal and anterior tibial arteries are diminutive in caliber and heavily disease. Veins: Venous imaging not performed. Review of the MIP images confirms the above findings. NON-VASCULAR Lower chest: Minor dependent bibasilar atelectasis. Normal heart size. No pericardial pleural effusion. Very large hiatal hernia. Hepatobiliary: No focal liver abnormality is seen. No gallstones, gallbladder wall thickening, or biliary dilatation. Pancreas: Unremarkable. No pancreatic ductal dilatation or surrounding inflammatory changes. Spleen: Spleen is normal in size. Calcified splenic granulomata evident. Adrenals/Urinary Tract: Adrenal glands are unremarkable. Kidneys are normal, without renal calculi, focal lesion, or hydronephrosis. Bladder is unremarkable. Stomach/Bowel: Large hiatal hernia again noted. Negative bowel obstruction, significant dilatation, ileus, or free air. Normal appendix. Diffuse colonic diverticulosis, most severe in the sigmoid and rectum. No acute inflammatory process. No free fluid or abscess. Lymphatic: No adenopathy. Reproductive: No acute finding. Prostate normal in size. Seminal vesicles unremarkable. Other: Small fat containing left inguinal hernia. Small midline fat containing ventral hernia above the umbilicus. Small fat containing left umbilical hernia as well. Musculoskeletal: Minor diffuse thoracolumbar degenerative spondylosis. Facets are aligned. No  compression fracture or acute osseous finding. IMPRESSION: VASCULAR Aortic atherosclerosis without aortic occlusion, dissection or aneurysm. 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 Right above knee popliteal artery is reconstituted via profunda femoral collaterals. Popliteal artery is diseased across the knee with preserved single vessel runoff via the right peroneal artery. Left common femoral  artery is reconstituted through the inferior epigastric pathways. Left profunda femoral artery remains patent. Severely diseased diffuse left SFA disease without occlusion but with multifocal tandem stenoses. Left popliteal artery is diseased but patent across the knee. Dominant single vessel runoff to the left lower extremity is the left posterior tibial artery. NON-VASCULAR Large hiatal hernia Splenic granulomata Colonic diverticulosis Fat containing ventral hernia, umbilical hernia, and left inguinal hernia. Electronically Signed   By: Judie PetitM.  Shick M.D.   On: 12/31/2016 17:00   [place] Arterial Seg Single  Result Date: 12/30/2016 CLINICAL DATA:  Claudication right worse than left after 50 yards. Tobacco abuse. EXAM: NONINVASIVE PHYSIOLOGIC VASCULAR STUDY OF BILATERAL LOWER EXTREMITIES TECHNIQUE: Evaluation of both lower extremities were performed at rest, including calculation of ankle-brachial indices with single level Doppler, pressure and pulse volume recording. COMPARISON:  None. 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 Should the patient fail conservative treatment, consider CTA runoff (higher spatial resolution) or MRA runoff (no radiation risk, can be  performed noncontrast in the setting of renal dysfunction) to better define the site and nature of arterial occlusive disease and delineate treatment options. Electronically Signed   By: Corlis Leak  Hassell M.D.   On: 12/30/2016 14:21   Dg Foot Complete Left  Result Date: 12/29/2016 CLINICAL DATA:  Left foot wound, pain, swelling and redness. EXAM: LEFT FOOT - COMPLETE 3+ VIEW COMPARISON:  None available FINDINGS: Left first MTP joint demonstrates degenerative osteoarthritis with associated hallux valgus deformity, joint space loss, sclerosis and bony spurring. No acute osseous finding or fracture. Dorsal foot soft tissue swelling on the lateral view. No visualized radiopaque or metallic foreign body. No plain radiographic evidence of osseous destruction or bone loss. 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 Electronically Signed   By: Judie PetitM.  Shick M.D.   On: 12/29/2016 20:05    Lab Data:  CBC:  Recent Labs Lab 12/29/16 1543 12/30/16 0445 01/01/17 0625 01/04/17 0346  WBC 10.2 8.2 8.0 9.8  NEUTROABS 6.6  --   --   --   HGB 15.3 13.7 15.1 15.2  HCT 43.4 39.5 43.3 43.6  MCV 89.7 90.8 90.2 90.1  PLT 311 301 300 291   Basic Metabolic Panel:  Recent Labs Lab 12/29/16 1543 12/30/16 0445 01/01/17 0625 01/04/17 0346  NA 133* 137 137 137  K 3.7 3.3* 3.7 3.9  CL 99* 107 104 105  CO2 25 24 26 25   GLUCOSE 101* 119* 93 109*  BUN 8 8 10 15   CREATININE 0.96 0.81 0.85 0.99  CALCIUM 9.0 8.3* 8.8* 8.9   GFR: Estimated Creatinine Clearance: 90.9 mL/min (by C-G formula based on SCr of 0.99 mg/dL). Liver Function Tests: No results for input(s): AST, ALT, ALKPHOS, BILITOT, PROT, ALBUMIN in the last 168 hours. No results for input(s): LIPASE, AMYLASE in the last 168 hours. No results for input(s): AMMONIA in the last 168 hours. Coagulation Profile: No results for input(s): INR,  PROTIME in the last 168 hours. Cardiac Enzymes: No results for input(s): CKTOTAL, CKMB, CKMBINDEX, TROPONINI in the last 168 hours. BNP (last 3 results) No results for input(s): PROBNP in the last 8760 hours. HbA1C: No results for input(s): HGBA1C in the last 72 hours. CBG: No results for input(s): GLUCAP in  the last 168 hours. Lipid Profile:  Recent Labs  01/03/17 1151  CHOL 172  HDL 28*  LDLCALC 102*  TRIG 210*  CHOLHDL 6.1   Thyroid Function Tests:  Recent Labs  01/03/17 1151  TSH 5.333*   Anemia Panel: No results for input(s): VITAMINB12, FOLATE, FERRITIN, TIBC, IRON, RETICCTPCT in the last 72 hours. Urine analysis:    Component Value Date/Time   COLORURINE YELLOW 01/18/2014 1830   APPEARANCEUR CLEAR 01/18/2014 1830   LABSPEC 1.020 01/18/2014 1830   PHURINE 6.0 01/18/2014 1830   GLUCOSEU NEGATIVE 01/18/2014 1830   HGBUR NEGATIVE 01/18/2014 1830   BILIRUBINUR NEGATIVE 01/18/2014 1830   KETONESUR NEGATIVE 01/18/2014 1830   PROTEINUR NEGATIVE 01/18/2014 1830   UROBILINOGEN 0.2 01/18/2014 1830   NITRITE NEGATIVE 01/18/2014 1830   LEUKOCYTESUR NEGATIVE 01/18/2014 1830     RAI,RIPUDEEP M.D. [facility] 01/04/2017, 11:22 AM  Pager: [phone] Between 7am to 7pm - call Pager - [phone]  After 7pm go to [email or web address] - password [email or web address]  Call night coverage person covering after 7pm

## 2017-01-05 LAB — CBC
HCT: 45 % (ref 39.0–52.0)
HEMOGLOBIN: 15.7 g/dL (ref 13.0–17.0)
MCH: 31.5 pg (ref 26.0–34.0)
MCHC: 34.9 g/dL (ref 30.0–36.0)
MCV: 90.2 fL (ref 78.0–100.0)
Platelets: 287 10*3/uL (ref 150–400)
RBC: 4.99 MIL/uL (ref 4.22–5.81)
RDW: 12.8 % (ref 11.5–15.5)
WBC: 8.5 10*3/uL (ref 4.0–10.5)

## 2017-01-05 LAB — BASIC METABOLIC PANEL
ANION GAP: 8 (ref 5–15)
BUN: 13 mg/dL (ref 6–20)
CALCIUM: 9 mg/dL (ref 8.9–10.3)
CO2: 27 mmol/L (ref 22–32)
Chloride: 102 mmol/L (ref 101–111)
Creatinine, Ser: 1.01 mg/dL (ref 0.61–1.24)
GFR calc Af Amer: >60 mL/min (ref >60)
GFR calc non Af Amer: >60 mL/min (ref >60)
GLUCOSE: 101 mg/dL — AB (ref 65–99)
Potassium: 4.2 mmol/L (ref 3.5–5.1)
Sodium: 137 mmol/L (ref 135–145)

## 2017-01-05 NOTE — Plan of Care (Signed)
Dr Arbie CookeyEarly discussed with me this morning that vasc surgery will assume care for Mr [name] as of today. No other acute medical issues. I will sign off. Please [facility] if needed.   Oziah Vitanza M.D. [facility] 01/05/2017, 8:21 AM  Pager: [phone]

## 2017-01-05 NOTE — Progress Notes (Signed)
Subjective: Interval History: none.. Remains afebrile. Pain well controlled  Objective: Vital signs in last 24 hours: Temp:  [97.8 F (36.6 C)-98.3 F (36.8 C)] 97.8 F (36.6 C) (02/05 1424) Pulse Rate:  [75-98] 98 (02/05 1424) Resp:  [18] 18 (02/05 1424) BP: (111-139)/(65-72) 122/66 (02/05 1424) SpO2:  [97 %-99 %] 97 % (02/05 1424)  Intake/Output from previous day: 02/04 0701 - 02/05 0700 In: -  Out: 750 [Urine:750] Intake/Output this shift: Total I/O In: -  Out: 450 [Urine:450]  Left lateral calf wound with some granulation base. The large wound over the dorsum of his foot has eschar present.  Lab Results:  Recent Labs  01/04/17 0346 01/05/17 0521  WBC 9.8 8.5  HGB 15.2 15.7  HCT 43.6 45.0  PLT 291 287   BMET  Recent Labs  01/04/17 0346 01/05/17 0521  NA 137 137  K 3.9 4.2  CL 105 102  CO2 25 27  GLUCOSE 109* 101*  BUN 15 13  CREATININE 0.99 1.01  CALCIUM 8.9 9.0    Studies/Results: Ct Angio Ao+bifem W & Or Wo Contrast  Result Date: 12/31/2016 CLINICAL DATA:  Bilateral lower extremity nonhealing wounds, claudication, peripheral vascular disease EXAM: CT ANGIOGRAPHY OF ABDOMINAL AORTA WITH ILIOFEMORAL RUNOFF TECHNIQUE: Multidetector CT imaging of the abdomen, pelvis and lower extremities was performed using the standard protocol during bolus administration of intravenous contrast. Multiplanar CT image reconstructions and MIPs were obtained to evaluate the vascular anatomy. CONTRAST:  150 cc Isovue 370 COMPARISON:  01/18/2014 FINDINGS: VASCULAR Aorta: Moderate wall thickening and calcific atherosclerosis of the aorta, most pronounced in the infrarenal segment. Negative for aneurysm or aortic occlusion. No dissection or retroperitoneal hemorrhage. Celiac: Patent origin. Splenic, left gastric, hepatic branches are also patent. SMA: Atherosclerotic origin with mild ostial narrowing, less than 50%. SMA trunk remains patent in the mesentery. Renals: Main renal  arteries and accessory right renal artery to the upper pole all remain patent with mild atherosclerotic origins. IMA: Atherosclerotic origin but remains patent off of the distal aorta anteriorly. RIGHT Lower Extremity Inflow: Heavily calcified right iliac system. Right common and external iliac arteries remain patent with mild luminal narrowings and small caliber but no occlusion. 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 Outflow: Right common femoral artery is atherosclerotic but patent. Right profunda femoral artery remains patent. Chronic occlusion of the right SFA. Right above knee popliteal artery is reconstituted but very small in caliber with calcific atherosclerosis and luminal narrowings. Right popliteal artery remains patent across the knee. Runoff: Dominant runoff vessel to the right lower extremity is the peroneal artery which reconstitutes the dorsalis pedis artery peripherally. Anterior tibial and posterior tibial arteries are very small in caliber and diffusely diseased. LEFT Lower Extremity Inflow: Chronic occlusion of the left common, internal and external iliac arteries. Outflow: Left common femoral artery is reconstituted by inferior epigastric collateral pathways. Left profunda femoral artery remains patent. Left SFA demonstrates severe disease proximally with multifocal stenoses and a very small patent lumen throughout the thigh. However, left SFA appears to remain patent. Popliteal artery is small in caliber but patent across the left knee. Runoff: Below the knee, the tibioperoneal trunk is heavily disease. Dominant left lower extremity runoff is via the left posterior tibial artery into the foot. Left peroneal and anterior tibial arteries are diminutive in caliber and heavily disease. Veins: Venous imaging not performed. Review of the MIP images confirms the above findings. NON-VASCULAR Lower chest: Minor dependent bibasilar atelectasis. Normal heart size. No pericardial pleural  effusion. Very large hiatal hernia. Hepatobiliary: No focal liver abnormality is seen. No gallstones, gallbladder wall thickening, or biliary dilatation. Pancreas: Unremarkable. No pancreatic ductal dilatation or surrounding inflammatory changes. Spleen: Spleen is normal in size. Calcified splenic granulomata evident. Adrenals/Urinary Tract: Adrenal glands are unremarkable. Kidneys are normal, without renal calculi, focal lesion, or hydronephrosis. Bladder is unremarkable. Stomach/Bowel: Large hiatal hernia again noted. Negative bowel obstruction, significant dilatation, ileus, or free air. Normal appendix. Diffuse colonic diverticulosis, most severe in the sigmoid and rectum. No acute inflammatory process. No free fluid or abscess. Lymphatic: No adenopathy. Reproductive: No acute finding. Prostate normal in size. Seminal vesicles unremarkable. Other: Small fat containing left inguinal hernia. Small midline fat containing ventral hernia above the umbilicus. Small fat containing left umbilical hernia as well. Musculoskeletal: Minor diffuse thoracolumbar degenerative spondylosis. Facets are aligned. No compression fracture or acute osseous finding. IMPRESSION: VASCULAR Aortic atherosclerosis without aortic occlusion, dissection or aneurysm. 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 Right above knee popliteal artery is reconstituted via profunda femoral collaterals. Popliteal artery is diseased across the knee with preserved single vessel runoff via the right peroneal artery. Left common femoral artery is reconstituted through the inferior epigastric pathways. Left profunda femoral artery remains patent. Severely diseased diffuse left SFA disease without occlusion but with multifocal tandem stenoses. Left popliteal artery is diseased but patent across the knee. Dominant  single vessel runoff to the left lower extremity is the left posterior tibial artery. NON-VASCULAR Large hiatal hernia Splenic granulomata Colonic diverticulosis Fat containing ventral hernia, umbilical hernia, and left inguinal hernia. Electronically Signed   By: Judie Petit.  Shick M.D.   On: 12/31/2016 17:00   US Arterial Seg Single  Result Date: 12/30/2016 CLINICAL DATA:  Claudication right worse than left after 50 yards. Tobacco abuse. EXAM: NONINVASIVE PHYSIOLOGIC VASCULAR STUDY OF BILATERAL LOWER EXTREMITIES TECHNIQUE: Evaluation of both lower extremities were performed at rest, including calculation of ankle-brachial indices with single level Doppler, pressure and pulse volume recording. COMPARISON:  None. 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 Should the patient fail conservative treatment, consider CTA runoff (higher spatial resolution) or MRA runoff (no radiation risk, can be performed noncontrast in the setting of renal dysfunction) to better define the site and nature of arterial occlusive disease and delineate treatment options. Electronically Signed   By: Corlis Leak M.D.   On: 12/30/2016 14:21   Dg Foot Complete Left  Result Date: 12/29/2016 CLINICAL DATA:  Left foot wound, pain, swelling and redness. EXAM: LEFT FOOT - COMPLETE 3+ VIEW COMPARISON:  None available FINDINGS: Left first MTP joint demonstrates degenerative osteoarthritis with associated hallux valgus deformity, joint space loss, sclerosis and bony spurring. No acute osseous finding or fracture. Dorsal foot soft tissue swelling on the lateral view. No visualized radiopaque or metallic foreign body. No plain radiographic evidence of osseous destruction or bone loss. 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 Electronically Signed   By: Judie Petit.  Shick M.D.   On: 12/29/2016 20:05   Anti-infectives: Anti-infectives    Start     Dose/Rate Route Frequency Ordered Stop   01/03/17 1400  ceFAZolin (ANCEF) IVPB 2g/100  mL premix     2 g 200 mL/hr over 30 Minutes Intravenous Every 8 hours 01/03/17 1058     12/30/16 1600  vancomycin (VANCOCIN) 1,500 mg in sodium chloride 0.9 % 500 mL IVPB  Status:  Discontinued     1,500 mg 250 mL/hr over 120 Minutes Intravenous Every 12 hours 12/30/16 0834 12/31/16 1413   12/30/16 0800  vancomycin (VANCOCIN) IVPB 1000 mg/200 mL premix  Status:  Discontinued     1,000 mg 200 mL/hr over 60 Minutes Intravenous  Once 12/30/16 0134 12/30/16 0834   12/29/16 1930  vancomycin (VANCOCIN) IVPB 1000 mg/200 mL premix     1,000 mg 200 mL/hr over 60 Minutes Intravenous  Once 12/29/16 1927 12/29/16 2110      Assessment/Plan: s/p Procedure(s): AORTOBIFEMORAL BYPASS GRAFT (Bilateral) Discussed with patient. Fortunately he has a wife and multiple other family members present today. Explained all the planned aortobifemoral bypass reimplantation of inferior mesenteric artery. Explained the magnitude of the procedure and expected recovery. Explained that he will require quite some time to heal the dorsum of his foot despite improved inflow due to the amount of tissue loss present. Patient is committed to smoking cessation and again congratulated him on this.   LOS: 7 days   EarlyTawanna Cooler, Marin Wisner 01/05/2017, 4:50 PM

## 2017-01-06 LAB — CBC
HCT: 44.8 % (ref 39.0–52.0)
HEMOGLOBIN: 15.5 g/dL (ref 13.0–17.0)
MCH: 31.1 pg (ref 26.0–34.0)
MCHC: 34.6 g/dL (ref 30.0–36.0)
MCV: 89.8 fL (ref 78.0–100.0)
PLATELETS: 283 10*3/uL (ref 150–400)
RBC: 4.99 MIL/uL (ref 4.22–5.81)
RDW: 12.9 % (ref 11.5–15.5)
WBC: 9.1 10*3/uL (ref 4.0–10.5)

## 2017-01-06 LAB — BASIC METABOLIC PANEL
Anion gap: 12 (ref 5–15)
BUN: 11 mg/dL (ref 6–20)
CALCIUM: 9.1 mg/dL (ref 8.9–10.3)
CO2: 25 mmol/L (ref 22–32)
CREATININE: 0.91 mg/dL (ref 0.61–1.24)
Chloride: 99 mmol/L — ABNORMAL LOW (ref 101–111)
Glucose, Bld: 107 mg/dL — ABNORMAL HIGH (ref 65–99)
Potassium: 4.1 mmol/L (ref 3.5–5.1)
SODIUM: 136 mmol/L (ref 135–145)

## 2017-01-06 LAB — SURGICAL PCR SCREEN
MRSA, PCR: NEGATIVE
STAPHYLOCOCCUS AUREUS: NEGATIVE

## 2017-01-06 MED ORDER — MAGNESIUM CITRATE PO SOLN
300.0000 mL | Freq: Once | ORAL | Status: AC
  Administered 2017-01-06: 300 mL via ORAL

## 2017-01-06 MED ORDER — CEFAZOLIN IN D5W 1 GM/50ML IV SOLN: 1.0000 g | INTRAVENOUS | Status: DC

## 2017-01-06 NOTE — Care Management Note (Signed)
Case Management Note  Patient Details  Name: Smitty CordsRoger Allen Knoke MRN: [id number] Date of Birth: 01/07/1961  Subjective/Objective:                 Patient being followed by vascular surgery, plan for surgery Wednesday, Continues IV Abx for cellulitis. From home with wife.  [facility] [place], [place]   Action/Plan:   Expected Discharge Date:                  Expected Discharge Plan:  Home/Self Care  In-House Referral:     Discharge planning Services  CM Consult  Post Acute Care Choice:    Choice offered to:     DME Arranged:    DME Agency:     HH Arranged:    HH Agency:     Status of Service:  In process, will continue to follow  If discussed at Long Length of Stay Meetings, dates discussed:    Additional Comments:  Lawerance SabalDebbie Shawn Dannenberg, RN 01/06/2017, 1:42 PM

## 2017-01-06 NOTE — Progress Notes (Signed)
Subjective: Interval History: none.Ian Wong afebrile  Objective: Vital signs in last 24 hours: Temp:  [97.8 F (36.6 C)-98.5 F (36.9 C)] 98.2 F (36.8 C) (02/06 0640) Pulse Rate:  [68-98] 68 (02/06 0640) Resp:  [18] 18 (02/06 0640) BP: (119-126)/(66-73) 119/68 (02/06 0640) SpO2:  [96 %-97 %] 96 % (02/06 0640) Weight:  [181 lb 3.2 oz (82.2 kg)] 181 lb 3.2 oz (82.2 kg) (02/06 0640)  Intake/Output from previous day: 02/05 0701 - 02/06 0700 In: 210 [P.O.:110; IV Piggyback:100] Out: 450 [Urine:450] Intake/Output this shift: No intake/output data recorded.  No change in physical exam. Dressings intact  Lab Results:  Recent Labs  01/05/17 0521 01/06/17 0654  WBC 8.5 9.1  HGB 15.7 15.5  HCT 45.0 44.8  PLT 287 283   BMET  Recent Labs  01/05/17 0521 01/06/17 0654  NA 137 136  K 4.2 4.1  CL 102 99*  CO2 27 25  GLUCOSE 101* 107*  BUN 13 11  CREATININE 1.01 0.91  CALCIUM 9.0 9.1    Studies/Results: Ct Angio Ao+bifem W & Or Wo Contrast  Result Date: 12/31/2016 CLINICAL DATA:  Bilateral lower extremity nonhealing wounds, claudication, peripheral vascular disease EXAM: CT ANGIOGRAPHY OF ABDOMINAL AORTA WITH ILIOFEMORAL RUNOFF TECHNIQUE: Multidetector CT imaging of the abdomen, pelvis and lower extremities was performed using the standard protocol during bolus administration of intravenous contrast. Multiplanar CT image reconstructions and MIPs were obtained to evaluate the vascular anatomy. CONTRAST:  150 cc Isovue 370 COMPARISON:  01/18/2014 FINDINGS: VASCULAR Aorta: Moderate wall thickening and calcific atherosclerosis of the aorta, most pronounced in the infrarenal segment. Negative for aneurysm or aortic occlusion. No dissection or retroperitoneal hemorrhage. Celiac: Patent origin. Splenic, left gastric, hepatic branches are also patent. SMA: Atherosclerotic origin with mild ostial narrowing, less than 50%. SMA trunk remains patent in the mesentery. Renals: Main renal  arteries and accessory right renal artery to the upper pole all remain patent with mild atherosclerotic origins. IMA: Atherosclerotic origin but remains patent off of the distal aorta anteriorly. RIGHT Lower Extremity Inflow: Heavily calcified right iliac system. Right common and external iliac arteries remain patent with mild luminal narrowings and small caliber but no occlusion. 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 Outflow: Right common femoral artery is atherosclerotic but patent. Right profunda femoral artery remains patent. Chronic occlusion of the right SFA. Right above knee popliteal artery is reconstituted but very small in caliber with calcific atherosclerosis and luminal narrowings. Right popliteal artery remains patent across the knee. Runoff: Dominant runoff vessel to the right lower extremity is the peroneal artery which reconstitutes the dorsalis pedis artery peripherally. Anterior tibial and posterior tibial arteries are very small in caliber and diffusely diseased. LEFT Lower Extremity Inflow: Chronic occlusion of the left common, internal and external iliac arteries. Outflow: Left common femoral artery is reconstituted by inferior epigastric collateral pathways. Left profunda femoral artery remains patent. Left SFA demonstrates severe disease proximally with multifocal stenoses and a very small patent lumen throughout the thigh. However, left SFA appears to remain patent. Popliteal artery is small in caliber but patent across the left knee. Runoff: Below the knee, the tibioperoneal trunk is heavily disease. Dominant left lower extremity runoff is via the left posterior tibial artery into the foot. Left peroneal and anterior tibial arteries are diminutive in caliber and heavily disease. Veins: Venous imaging not performed. Review of the MIP images confirms the above findings. NON-VASCULAR Lower chest: Minor dependent bibasilar atelectasis. Normal heart size. No pericardial pleural  effusion. Very  large hiatal hernia. Hepatobiliary: No focal liver abnormality is seen. No gallstones, gallbladder wall thickening, or biliary dilatation. Pancreas: Unremarkable. No pancreatic ductal dilatation or surrounding inflammatory changes. Spleen: Spleen is normal in size. Calcified splenic granulomata evident. Adrenals/Urinary Tract: Adrenal glands are unremarkable. Kidneys are normal, without renal calculi, focal lesion, or hydronephrosis. Bladder is unremarkable. Stomach/Bowel: Large hiatal hernia again noted. Negative bowel obstruction, significant dilatation, ileus, or free air. Normal appendix. Diffuse colonic diverticulosis, most severe in the sigmoid and rectum. No acute inflammatory process. No free fluid or abscess. Lymphatic: No adenopathy. Reproductive: No acute finding. Prostate normal in size. Seminal vesicles unremarkable. Other: Small fat containing left inguinal hernia. Small midline fat containing ventral hernia above the umbilicus. Small fat containing left umbilical hernia as well. Musculoskeletal: Minor diffuse thoracolumbar degenerative spondylosis. Facets are aligned. No compression fracture or acute osseous finding. IMPRESSION: VASCULAR Aortic atherosclerosis without aortic occlusion, dissection or aneurysm. 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 Right above knee popliteal artery is reconstituted via profunda femoral collaterals. Popliteal artery is diseased across the knee with preserved single vessel runoff via the right peroneal artery. Left common femoral artery is reconstituted through the inferior epigastric pathways. Left profunda femoral artery remains patent. Severely diseased diffuse left SFA disease without occlusion but with multifocal tandem stenoses. Left popliteal artery is diseased but patent across the knee. Dominant  single vessel runoff to the left lower extremity is the left posterior tibial artery. NON-VASCULAR Large hiatal hernia Splenic granulomata Colonic diverticulosis Fat containing ventral hernia, umbilical hernia, and left inguinal hernia. Electronically Signed   By: Judie PetitM.  Shick M.D.   On: 12/31/2016 17:00   [place] Arterial Seg Single  Result Date: 12/30/2016 CLINICAL DATA:  Claudication right worse than left after 50 yards. Tobacco abuse. EXAM: NONINVASIVE PHYSIOLOGIC VASCULAR STUDY OF BILATERAL LOWER EXTREMITIES TECHNIQUE: Evaluation of both lower extremities were performed at rest, including calculation of ankle-brachial indices with single level Doppler, pressure and pulse volume recording. COMPARISON:  None. 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 Should the patient fail conservative treatment, consider CTA runoff (higher spatial resolution) or MRA runoff (no radiation risk, can be performed noncontrast in the setting of renal dysfunction) to better define the site and nature of arterial occlusive disease and delineate treatment options. Electronically Signed   By: Corlis Leak  Hassell M.D.   On: 12/30/2016 14:21   Dg Foot Complete Left  Result Date: 12/29/2016 CLINICAL DATA:  Left foot wound, pain, swelling and redness. EXAM: LEFT FOOT - COMPLETE 3+ VIEW COMPARISON:  None available FINDINGS: Left first MTP joint demonstrates degenerative osteoarthritis with associated hallux valgus deformity, joint space loss, sclerosis and bony spurring. No acute osseous finding or fracture. Dorsal foot soft tissue swelling on the lateral view. No visualized radiopaque or metallic foreign body. No plain radiographic evidence of osseous destruction or bone loss. 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 Electronically Signed   By: Judie PetitM.  Shick M.D.   On: 12/29/2016 20:05   Anti-infectives: Anti-infectives    Start     Dose/Rate Route Frequency Ordered Stop   01/07/17 0600  ceFAZolin (ANCEF) IVPB 1 g/50 mL  premix    Comments:  Send with pt to OR   1 g 100 mL/hr over 30 Minutes Intravenous To Short Stay 01/06/17 0727 01/08/17 0600   01/03/17 1400  ceFAZolin (ANCEF) IVPB 2g/100 mL premix     2 g 200 mL/hr over 30 Minutes Intravenous Every 8 hours 01/03/17 1058     12/30/16 1600  vancomycin (VANCOCIN) 1,500 mg in sodium chloride 0.9 % 500 mL IVPB  Status:  Discontinued     1,500 mg 250 mL/hr over 120 Minutes Intravenous Every 12 hours 12/30/16 0834 12/31/16 1413   12/30/16 0800  vancomycin (VANCOCIN) IVPB 1000 mg/200 mL premix  Status:  Discontinued     1,000 mg 200 mL/hr over 60 Minutes Intravenous  Once 12/30/16 0134 12/30/16 0834   12/29/16 1930  vancomycin (VANCOCIN) IVPB 1000 mg/200 mL premix     1,000 mg 200 mL/hr over 60 Minutes Intravenous  Once 12/29/16 1927 12/29/16 2110      Assessment/Plan: s/p Procedure(s): AORTOBIFEMORAL BYPASS GRAFT (Bilateral) Plan for OR tomorrow. Again discussed the procedure aortofemoral bypass grafting and expected postoperative course.   LOS: 8 days   Gretta Began 01/06/2017, 8:36 AM

## 2017-01-07 ENCOUNTER — Inpatient Hospital Stay (HOSPITAL_COMMUNITY): Payer: Self-pay | Admitting: Anesthesiology

## 2017-01-07 ENCOUNTER — Inpatient Hospital Stay (HOSPITAL_COMMUNITY): Payer: Self-pay

## 2017-01-07 ENCOUNTER — Encounter (HOSPITAL_COMMUNITY): Payer: Self-pay | Admitting: Anesthesiology

## 2017-01-07 ENCOUNTER — Encounter (HOSPITAL_COMMUNITY)
Admission: EM | Disposition: A | Discharge status: Home / Self Care | Payer: Self-pay | Source: Home / Self Care | Attending: Vascular Surgery

## 2017-01-07 DIAGNOSIS — I7409 Other arterial embolism and thrombosis of abdominal aorta: Secondary | ICD-10-CM | POA: Diagnosis present

## 2017-01-07 HISTORY — PX: AORTA - BILATERAL FEMORAL ARTERY BYPASS GRAFT: SHX1175

## 2017-01-07 LAB — CBC
HCT: 36.3 % — ABNORMAL LOW (ref 39.0–52.0)
Hemoglobin: 12.7 g/dL — ABNORMAL LOW (ref 13.0–17.0)
MCH: 31.4 pg (ref 26.0–34.0)
MCHC: 35 g/dL (ref 30.0–36.0)
MCV: 89.9 fL (ref 78.0–100.0)
PLATELETS: 263 10*3/uL (ref 150–400)
RBC: 4.04 MIL/uL — AB (ref 4.22–5.81)
RDW: 12.6 % (ref 11.5–15.5)
WBC: 17.8 10*3/uL — ABNORMAL HIGH (ref 4.0–10.5)

## 2017-01-07 LAB — BLOOD GAS, ARTERIAL
Acid-base deficit: 0.1 mmol/L (ref 0.0–2.0)
Bicarbonate: 24.6 mmol/L (ref 20.0–28.0)
DRAWN BY: 126081
O2 Content: 2 L/min
O2 Saturation: 95.5 %
PCO2 ART: 42.3 mmHg (ref 32.0–48.0)
PH ART: 7.376 (ref 7.350–7.450)
Patient temperature: 96.8
pO2, Arterial: 80.1 mmHg — ABNORMAL LOW (ref 83.0–108.0)

## 2017-01-07 LAB — BASIC METABOLIC PANEL
Anion gap: 7 (ref 5–15)
BUN: 11 mg/dL (ref 6–20)
CHLORIDE: 104 mmol/L (ref 101–111)
CO2: 24 mmol/L (ref 22–32)
Calcium: 8.2 mg/dL — ABNORMAL LOW (ref 8.9–10.3)
Creatinine, Ser: 0.87 mg/dL (ref 0.61–1.24)
GFR calc Af Amer: >60 mL/min (ref >60)
GLUCOSE: 158 mg/dL — AB (ref 65–99)
POTASSIUM: 4.5 mmol/L (ref 3.5–5.1)
Sodium: 135 mmol/L (ref 135–145)

## 2017-01-07 LAB — ABO/RH: ABO/RH(D): O POS

## 2017-01-07 LAB — TYPE AND SCREEN
ABO/RH(D): O POS
ANTIBODY SCREEN: NEGATIVE

## 2017-01-07 LAB — APTT: aPTT: 31 seconds (ref 24–36)

## 2017-01-07 SURGERY — CREATION, BYPASS, ARTERIAL, AORTA TO FEMORAL, BILATERAL, USING GRAFT
Anesthesia: General | Laterality: Bilateral

## 2017-01-07 MED ORDER — EPHEDRINE SULFATE 50 MG/ML IJ SOLN
INTRAMUSCULAR | Status: DC | PRN
  Administered 2017-01-07 (×2): 5 mg via INTRAVENOUS

## 2017-01-07 MED ORDER — SUFENTANIL CITRATE 50 MCG/ML IV SOLN
INTRAVENOUS | Status: DC | PRN
  Administered 2017-01-07 (×3): 10 ug via INTRAVENOUS
  Administered 2017-01-07: 5 ug via INTRAVENOUS
  Administered 2017-01-07 (×3): 10 ug via INTRAVENOUS
  Administered 2017-01-07: 5 ug via INTRAVENOUS
  Administered 2017-01-07 (×3): 10 ug via INTRAVENOUS

## 2017-01-07 MED ORDER — PROMETHAZINE HCL 25 MG/ML IJ SOLN: 6.2500 mg | INTRAMUSCULAR | Status: DC | PRN

## 2017-01-07 MED ORDER — HYDRALAZINE HCL 20 MG/ML IJ SOLN: 5.0000 mg | INTRAMUSCULAR | Status: DC | PRN

## 2017-01-07 MED ORDER — ACETAMINOPHEN 325 MG RE SUPP: 325.0000 mg | RECTAL | Status: DC | PRN

## 2017-01-07 MED ORDER — SODIUM CHLORIDE 0.9 % IJ SOLN
INTRAMUSCULAR | Status: AC
  Filled 2017-01-07: qty 10

## 2017-01-07 MED ORDER — ONDANSETRON HCL 4 MG/2ML IJ SOLN
INTRAMUSCULAR | Status: AC
  Filled 2017-01-07: qty 2

## 2017-01-07 MED ORDER — HYDROMORPHONE HCL 1 MG/ML IJ SOLN
0.2500 mg | INTRAMUSCULAR | Status: DC | PRN
  Administered 2017-01-07: 0.5 mg via INTRAVENOUS

## 2017-01-07 MED ORDER — LIDOCAINE HCL (CARDIAC) 20 MG/ML IV SOLN
INTRAVENOUS | Status: DC | PRN
  Administered 2017-01-07: 50 mg via INTRAVENOUS

## 2017-01-07 MED ORDER — SUFENTANIL CITRATE 50 MCG/ML IV SOLN
INTRAVENOUS | Status: AC
  Filled 2017-01-07: qty 2

## 2017-01-07 MED ORDER — MIDAZOLAM HCL 5 MG/5ML IJ SOLN
INTRAMUSCULAR | Status: DC | PRN
  Administered 2017-01-07 (×2): 1 mg via INTRAVENOUS

## 2017-01-07 MED ORDER — HEPARIN SODIUM (PORCINE) 1000 UNIT/ML IJ SOLN
INTRAMUSCULAR | Status: DC | PRN
  Administered 2017-01-07: 8000 [IU] via INTRAVENOUS

## 2017-01-07 MED ORDER — CEFAZOLIN SODIUM-DEXTROSE 2-3 GM-% IV SOLR
INTRAVENOUS | Status: DC | PRN
  Administered 2017-01-07: 2 g via INTRAVENOUS

## 2017-01-07 MED ORDER — MIDAZOLAM HCL 2 MG/2ML IJ SOLN
INTRAMUSCULAR | Status: AC
  Filled 2017-01-07: qty 2

## 2017-01-07 MED ORDER — PHENYLEPHRINE HCL 10 MG/ML IJ SOLN
INTRAVENOUS | Status: DC | PRN
  Administered 2017-01-07: 25 ug/min via INTRAVENOUS

## 2017-01-07 MED ORDER — PROTAMINE SULFATE 10 MG/ML IV SOLN
INTRAVENOUS | Status: DC | PRN
  Administered 2017-01-07 (×5): 10 mg via INTRAVENOUS

## 2017-01-07 MED ORDER — PROTAMINE SULFATE 10 MG/ML IV SOLN
INTRAVENOUS | Status: AC
  Filled 2017-01-07: qty 5

## 2017-01-07 MED ORDER — LACTATED RINGERS IV SOLN
INTRAVENOUS | Status: DC | PRN
  Administered 2017-01-07 (×2): via INTRAVENOUS

## 2017-01-07 MED ORDER — PANTOPRAZOLE SODIUM 40 MG IV SOLR
40.0000 mg | Freq: Every day | INTRAVENOUS | Status: DC
  Administered 2017-01-07 – 2017-01-13 (×7): 40 mg via INTRAVENOUS
  Filled 2017-01-07 (×7): qty 40

## 2017-01-07 MED ORDER — MANNITOL 25 % IV SOLN
INTRAVENOUS | Status: DC | PRN
  Administered 2017-01-07: 25 g via INTRAVENOUS

## 2017-01-07 MED ORDER — ALBUMIN HUMAN 5 % IV SOLN
INTRAVENOUS | Status: DC | PRN
  Administered 2017-01-07 (×3): via INTRAVENOUS

## 2017-01-07 MED ORDER — ORAL CARE MOUTH RINSE
15.0000 mL | Freq: Two times a day (BID) | OROMUCOSAL | Status: DC
  Administered 2017-01-07 – 2017-01-14 (×9): 15 mL via OROMUCOSAL

## 2017-01-07 MED ORDER — SUGAMMADEX SODIUM 200 MG/2ML IV SOLN
INTRAVENOUS | Status: AC
  Filled 2017-01-07: qty 2

## 2017-01-07 MED ORDER — LABETALOL HCL 5 MG/ML IV SOLN
INTRAVENOUS | Status: DC | PRN
  Administered 2017-01-07 (×2): 2.5 mg via INTRAVENOUS

## 2017-01-07 MED ORDER — METOPROLOL TARTRATE 5 MG/5ML IV SOLN
2.5000 mg | Freq: Four times a day (QID) | INTRAVENOUS | Status: DC
  Administered 2017-01-07 – 2017-01-14 (×23): 2.5 mg via INTRAVENOUS
  Filled 2017-01-07 (×25): qty 5

## 2017-01-07 MED ORDER — ARTIFICIAL TEARS OP OINT
TOPICAL_OINTMENT | OPHTHALMIC | Status: DC | PRN
  Administered 2017-01-07: 1 via OPHTHALMIC

## 2017-01-07 MED ORDER — GUAIFENESIN-DM 100-10 MG/5ML PO SYRP: 15.0000 mL | ORAL_SOLUTION | ORAL | Status: DC | PRN

## 2017-01-07 MED ORDER — SODIUM CHLORIDE 0.9 % IV SOLN
INTRAVENOUS | Status: DC | PRN
  Administered 2017-01-07: 12:00:00 via INTRAVENOUS

## 2017-01-07 MED ORDER — PROPOFOL 10 MG/ML IV BOLUS
INTRAVENOUS | Status: DC | PRN
  Administered 2017-01-07: 140 mg via INTRAVENOUS

## 2017-01-07 MED ORDER — ONDANSETRON HCL 4 MG/2ML IJ SOLN
INTRAMUSCULAR | Status: DC | PRN
  Administered 2017-01-07: 4 mg via INTRAVENOUS

## 2017-01-07 MED ORDER — ESMOLOL HCL 100 MG/10ML IV SOLN
INTRAVENOUS | Status: DC | PRN
  Administered 2017-01-07: 10 mg via INTRAVENOUS

## 2017-01-07 MED ORDER — ONDANSETRON HCL 4 MG/2ML IJ SOLN
4.0000 mg | Freq: Four times a day (QID) | INTRAMUSCULAR | Status: DC | PRN
  Administered 2017-01-09 – 2017-01-12 (×6): 4 mg via INTRAVENOUS
  Filled 2017-01-07 (×6): qty 2

## 2017-01-07 MED ORDER — ACETAMINOPHEN 325 MG PO TABS: 325.0000 mg | ORAL_TABLET | ORAL | Status: DC | PRN

## 2017-01-07 MED ORDER — KCL IN DEXTROSE-NACL 20-5-0.45 MEQ/L-%-% IV SOLN
INTRAVENOUS | Status: DC
  Administered 2017-01-07 – 2017-01-08 (×2): via INTRAVENOUS
  Filled 2017-01-07 (×3): qty 1000

## 2017-01-07 MED ORDER — PROPOFOL 10 MG/ML IV BOLUS
INTRAVENOUS | Status: AC
  Filled 2017-01-07: qty 40

## 2017-01-07 MED ORDER — SODIUM CHLORIDE 0.9 % IV SOLN: 500.0000 mL | Freq: Once | INTRAVENOUS | Status: DC | PRN

## 2017-01-07 MED ORDER — ARTIFICIAL TEARS OP OINT
TOPICAL_OINTMENT | OPHTHALMIC | Status: AC
  Filled 2017-01-07: qty 3.5

## 2017-01-07 MED ORDER — SUGAMMADEX SODIUM 200 MG/2ML IV SOLN
INTRAVENOUS | Status: DC | PRN
  Administered 2017-01-07: 160 mg via INTRAVENOUS

## 2017-01-07 MED ORDER — LABETALOL HCL 5 MG/ML IV SOLN: 10.0000 mg | INTRAVENOUS | Status: DC | PRN

## 2017-01-07 MED ORDER — LIDOCAINE 2% (20 MG/ML) 5 ML SYRINGE
INTRAMUSCULAR | Status: AC
  Filled 2017-01-07: qty 5

## 2017-01-07 MED ORDER — POTASSIUM CHLORIDE CRYS ER 20 MEQ PO TBCR: 20.0000 meq | EXTENDED_RELEASE_TABLET | Freq: Once | ORAL | Status: DC | PRN

## 2017-01-07 MED ORDER — METOPROLOL TARTRATE 5 MG/5ML IV SOLN: 2.0000 mg | INTRAVENOUS | Status: DC | PRN

## 2017-01-07 MED ORDER — FENTANYL CITRATE (PF) 100 MCG/2ML IJ SOLN
INTRAMUSCULAR | Status: AC
  Filled 2017-01-07: qty 2

## 2017-01-07 MED ORDER — PANTOPRAZOLE SODIUM 40 MG PO TBEC: 40.0000 mg | DELAYED_RELEASE_TABLET | Freq: Every day | ORAL | Status: DC

## 2017-01-07 MED ORDER — HEPARIN SODIUM (PORCINE) 1000 UNIT/ML IJ SOLN
INTRAMUSCULAR | Status: AC
  Filled 2017-01-07: qty 1

## 2017-01-07 MED ORDER — ROCURONIUM BROMIDE 100 MG/10ML IV SOLN
INTRAVENOUS | Status: DC | PRN
  Administered 2017-01-07: 30 mg via INTRAVENOUS
  Administered 2017-01-07: 20 mg via INTRAVENOUS
  Administered 2017-01-07 (×2): 10 mg via INTRAVENOUS
  Administered 2017-01-07: 50 mg via INTRAVENOUS

## 2017-01-07 MED ORDER — LABETALOL HCL 5 MG/ML IV SOLN
INTRAVENOUS | Status: AC
  Filled 2017-01-07: qty 4

## 2017-01-07 MED ORDER — 0.9 % SODIUM CHLORIDE (POUR BTL) OPTIME
TOPICAL | Status: DC | PRN
  Administered 2017-01-07: 3000 mL

## 2017-01-07 MED ORDER — BISACODYL 10 MG RE SUPP: 10.0000 mg | Freq: Every day | RECTAL | Status: DC | PRN

## 2017-01-07 MED ORDER — LACTATED RINGERS IV SOLN
INTRAVENOUS | Status: DC | PRN
  Administered 2017-01-07: 08:00:00 via INTRAVENOUS

## 2017-01-07 MED ORDER — ALUM & MAG HYDROXIDE-SIMETH 200-200-20 MG/5ML PO SUSP: 15.0000 mL | ORAL | Status: DC | PRN

## 2017-01-07 MED ORDER — PHENOL 1.4 % MT LIQD: 1.0000 | OROMUCOSAL | Status: DC | PRN

## 2017-01-07 MED ORDER — MORPHINE SULFATE (PF) 2 MG/ML IV SOLN
2.0000 mg | INTRAVENOUS | Status: DC | PRN
  Administered 2017-01-07 – 2017-01-14 (×37): 2 mg via INTRAVENOUS
  Filled 2017-01-07 (×6): qty 1
  Filled 2017-01-07: qty 2
  Filled 2017-01-07 (×30): qty 1

## 2017-01-07 MED ORDER — HYDROMORPHONE HCL 2 MG/ML IJ SOLN
INTRAMUSCULAR | Status: AC
  Filled 2017-01-07: qty 1

## 2017-01-07 MED ORDER — MANNITOL 25 % IV SOLN
25.0000 g | INTRAVENOUS | Status: DC
  Filled 2017-01-07: qty 100

## 2017-01-07 MED ORDER — MAGNESIUM SULFATE 2 GM/50ML IV SOLN
2.0000 g | Freq: Once | INTRAVENOUS | Status: DC | PRN
  Filled 2017-01-07: qty 50

## 2017-01-07 MED ORDER — HEPARIN SODIUM (PORCINE) 5000 UNIT/ML IJ SOLN
5000.0000 [IU] | Freq: Three times a day (TID) | INTRAMUSCULAR | Status: DC
  Administered 2017-01-08 – 2017-01-15 (×21): 5000 [IU] via SUBCUTANEOUS
  Filled 2017-01-07 (×20): qty 1

## 2017-01-07 MED ORDER — ROCURONIUM BROMIDE 50 MG/5ML IV SOSY
PREFILLED_SYRINGE | INTRAVENOUS | Status: AC
  Filled 2017-01-07: qty 10

## 2017-01-07 MED ORDER — SODIUM CHLORIDE 0.9 % IV SOLN
INTRAVENOUS | Status: DC | PRN
  Administered 2017-01-07: 10:00:00

## 2017-01-07 MED ORDER — ESMOLOL HCL 100 MG/10ML IV SOLN
INTRAVENOUS | Status: AC
  Filled 2017-01-07: qty 10

## 2017-01-07 SURGICAL SUPPLY — 49 items
CANISTER SUCTION 2500CC (MISCELLANEOUS) ×3 IMPLANT
CANNULA VESSEL 3MM 2 BLNT TIP (CANNULA) ×6 IMPLANT
CLIP LIGATING EXTRA MED SLVR (CLIP) ×3 IMPLANT
CLIP LIGATING EXTRA SM BLUE (MISCELLANEOUS) ×3 IMPLANT
COVER TABLE BACK 60X90 (DRAPES) ×3 IMPLANT
ELECT BLADE 4.0 EZ CLEAN MEGAD (MISCELLANEOUS) ×3
ELECT REM PT RETURN 9FT ADLT (ELECTROSURGICAL) ×3
ELECTRODE BLDE 4.0 EZ CLN MEGD (MISCELLANEOUS) ×1 IMPLANT
ELECTRODE REM PT RTRN 9FT ADLT (ELECTROSURGICAL) ×1 IMPLANT
GLOVE BIO SURGEON STRL SZ 6.5 (GLOVE) ×6 IMPLANT
GLOVE BIO SURGEONS STRL SZ 6.5 (GLOVE) ×3
GLOVE BIOGEL PI IND STRL 6.5 (GLOVE) ×3 IMPLANT
GLOVE BIOGEL PI IND STRL 7.0 (GLOVE) ×2 IMPLANT
GLOVE BIOGEL PI INDICATOR 6.5 (GLOVE) ×6
GLOVE BIOGEL PI INDICATOR 7.0 (GLOVE) ×4
GLOVE SS BIOGEL STRL SZ 7.5 (GLOVE) ×1 IMPLANT
GLOVE SUPERSENSE BIOGEL SZ 7.5 (GLOVE) ×2
GLOVE SURG SS PI 6.5 STRL IVOR (GLOVE) ×6 IMPLANT
GOWN STRL REUS W/ TWL LRG LVL3 (GOWN DISPOSABLE) ×3 IMPLANT
GOWN STRL REUS W/TWL LRG LVL3 (GOWN DISPOSABLE) ×6
GRAFT HEMASHIELD 14X8MM (Vascular Products) ×3 IMPLANT
INSERT FOGARTY 61MM (MISCELLANEOUS) ×6 IMPLANT
INSERT FOGARTY SM (MISCELLANEOUS) ×6 IMPLANT
KIT BASIN OR (CUSTOM PROCEDURE TRAY) ×3 IMPLANT
KIT ROOM TURNOVER OR (KITS) ×3 IMPLANT
LIQUID BAND (GAUZE/BANDAGES/DRESSINGS) ×9 IMPLANT
NS IRRIG 1000ML POUR BTL (IV SOLUTION) ×6 IMPLANT
PACK AORTA (CUSTOM PROCEDURE TRAY) ×3 IMPLANT
PAD ARMBOARD 7.5X6 YLW CONV (MISCELLANEOUS) ×6 IMPLANT
SUT PDS AB 1 TP1 54 (SUTURE) ×6 IMPLANT
SUT PROLENE 3 0 SH1 36 (SUTURE) ×6 IMPLANT
SUT PROLENE 5 0 C 1 24 (SUTURE) ×6 IMPLANT
SUT PROLENE 5 0 C 1 36 (SUTURE) ×3 IMPLANT
SUT SILK 2 0 (SUTURE) ×2
SUT SILK 2 0 SH CR/8 (SUTURE) ×3 IMPLANT
SUT SILK 2 0 TIES 17X18 (SUTURE) ×2
SUT SILK 2-0 18XBRD TIE 12 (SUTURE) ×1 IMPLANT
SUT SILK 2-0 18XBRD TIE BLK (SUTURE) ×1 IMPLANT
SUT SILK 3 0 (SUTURE) ×2
SUT SILK 3 0 TIES 17X18 (SUTURE) ×2
SUT SILK 3-0 18XBRD TIE 12 (SUTURE) ×1 IMPLANT
SUT SILK 3-0 18XBRD TIE BLK (SUTURE) ×1 IMPLANT
SUT VIC AB 2-0 CT1 36 (SUTURE) ×12 IMPLANT
SUT VIC AB 3-0 SH 27 (SUTURE) ×8
SUT VIC AB 3-0 SH 27X BRD (SUTURE) ×4 IMPLANT
SUT VICRYL 4-0 PS2 18IN ABS (SUTURE) ×6 IMPLANT
TOWEL BLUE STERILE X RAY DET (MISCELLANEOUS) ×6 IMPLANT
TRAY FOLEY W/METER SILVER 16FR (SET/KITS/TRAYS/PACK) ×3 IMPLANT
WATER STERILE IRR 1000ML POUR (IV SOLUTION) ×6 IMPLANT

## 2017-01-07 NOTE — Interval H&P Note (Signed)
History and Physical Interval Note:  01/07/2017 8:21 AM  Ian Wong  has presented today for surgery, with the diagnosis of Left lower extremity cellulitis L03.119  The various methods of treatment have been discussed with the patient and family. After consideration of risks, benefits and other options for treatment, the patient has consented to  Procedure(s): AORTOBIFEMORAL BYPASS GRAFT (Bilateral) as a surgical intervention .  The patient's history has been reviewed, patient examined, no change in status, stable for surgery.  I have reviewed the patient's chart and labs.  Questions were answered to the patient's satisfaction.     Gretta BeganEarly, Todd

## 2017-01-07 NOTE — Progress Notes (Signed)
  Day of Surgery Note    Subjective:  C/o pain   Vitals:   01/07/17 1258 01/07/17 1259  BP:  128/71  Pulse: 90 91  Resp: (!) 21 20  Temp: (!) 96.8 F (36 C)     Incisions:   All incisions are clean and dry Extremities:  Brisk doppler signals bilateral DP/PT; (right DP is faintly palpable) Cardiac:  regular Lungs:  Non labored Abdomen:  Softly distended  Assessment/Plan:  This is a 56 y.o. male who is s/p aortobifemoral bypass grafting  -pt doing well with brisk doppler signals bilateral DP /PT -pain control with Morphine-may need PCA if IV push not effective -scheduled lopressor 2.5mg  IV q6h for BP control -strict npo -to 2 south when bed available   Dow ChemicalSamantha Bernadett Milian, PA-C 01/07/2017 1:21 PM [phone]

## 2017-01-07 NOTE — Anesthesia Preprocedure Evaluation (Addendum)
Anesthesia Evaluation  Patient identified by MRN, date of birth, ID band Patient awake    Reviewed: Allergy & Precautions, NPO status , Patient's Chart, lab work & pertinent test results  History of Anesthesia Complications Negative for: history of anesthetic complications  Airway Mallampati: II  TM Distance: >3 FB Neck ROM: Full    Dental  (+) Teeth Intact, Poor Dentition, Missing, Dental Advisory Given   Pulmonary Current Smoker,    breath sounds clear to auscultation       Cardiovascular + Peripheral Vascular Disease   Rhythm:Regular Rate:Normal     Neuro/Psych    GI/Hepatic negative GI ROS, Neg liver ROS,   Endo/Other  negative endocrine ROS  Renal/GU negative Renal ROS     Musculoskeletal   Abdominal   Peds  Hematology negative hematology ROS (+)   Anesthesia Other Findings   Reproductive/Obstetrics                           Anesthesia Physical Anesthesia Plan  ASA: III  Anesthesia Plan: General   Post-op Pain Management:    Induction: Intravenous  Airway Management Planned: Oral ETT  Additional Equipment: Arterial line  Intra-op Plan:   Post-operative Plan:   Informed Consent: I have reviewed the patients History and Physical, chart, labs and discussed the procedure including the risks, benefits and alternatives for the proposed anesthesia with the patient or authorized representative who has indicated his/her understanding and acceptance.   Dental advisory given  Plan Discussed with: CRNA  Anesthesia Plan Comments:        Anesthesia Quick Evaluation

## 2017-01-07 NOTE — Anesthesia Procedure Notes (Signed)
Procedure Name: Intubation Date/Time: 01/07/2017 8:44 AM Performed by: Edmonia CaprioAUSTON, Ian Sanjuan M Pre-anesthesia Checklist: Patient identified, Emergency Drugs available, Suction available, Timeout performed and Patient being monitored Patient Re-evaluated:Patient Re-evaluated prior to inductionOxygen Delivery Method: Circle system utilized Preoxygenation: Pre-oxygenation with 100% oxygen Intubation Type: IV induction Ventilation: Mask ventilation without difficulty Laryngoscope Size: Miller Grade View: Grade I Tube type: Oral Tube size: 7.5 mm Number of attempts: 1 Airway Equipment and Method: Stylet Placement Confirmation: ETT inserted through vocal cords under direct vision,  positive ETCO2 and breath sounds checked- equal and bilateral Secured at: 22 cm Tube secured with: Tape Dental Injury: Teeth and Oropharynx as per pre-operative assessment

## 2017-01-07 NOTE — H&P (View-Only) (Signed)
Subjective: Interval History: none.Ian Wong afebrile  Objective: Vital signs in last 24 hours: Temp:  [97.8 F (36.6 C)-98.5 F (36.9 C)] 98.2 F (36.8 C) (02/06 0640) Pulse Rate:  [68-98] 68 (02/06 0640) Resp:  [18] 18 (02/06 0640) BP: (119-126)/(66-73) 119/68 (02/06 0640) SpO2:  [96 %-97 %] 96 % (02/06 0640) Weight:  [181 lb 3.2 oz (82.2 kg)] 181 lb 3.2 oz (82.2 kg) (02/06 0640)  Intake/Output from previous day: 02/05 0701 - 02/06 0700 In: 210 [P.O.:110; IV Piggyback:100] Out: 450 [Urine:450] Intake/Output this shift: No intake/output data recorded.  No change in physical exam. Dressings intact  Lab Results:  Recent Labs  01/05/17 0521 01/06/17 0654  WBC 8.5 9.1  HGB 15.7 15.5  HCT 45.0 44.8  PLT 287 283   BMET  Recent Labs  01/05/17 0521 01/06/17 0654  NA 137 136  K 4.2 4.1  CL 102 99*  CO2 27 25  GLUCOSE 101* 107*  BUN 13 11  CREATININE 1.01 0.91  CALCIUM 9.0 9.1    Studies/Results: Ct Angio Ao+bifem W & Or Wo Contrast  Result Date: 12/31/2016 CLINICAL DATA:  Bilateral lower extremity nonhealing wounds, claudication, peripheral vascular disease EXAM: CT ANGIOGRAPHY OF ABDOMINAL AORTA WITH ILIOFEMORAL RUNOFF TECHNIQUE: Multidetector CT imaging of the abdomen, pelvis and lower extremities was performed using the standard protocol during bolus administration of intravenous contrast. Multiplanar CT image reconstructions and MIPs were obtained to evaluate the vascular anatomy. CONTRAST:  150 cc Isovue 370 COMPARISON:  01/18/2014 FINDINGS: VASCULAR Aorta: Moderate wall thickening and calcific atherosclerosis of the aorta, most pronounced in the infrarenal segment. Negative for aneurysm or aortic occlusion. No dissection or retroperitoneal hemorrhage. Celiac: Patent origin. Splenic, left gastric, hepatic branches are also patent. SMA: Atherosclerotic origin with mild ostial narrowing, less than 50%. SMA trunk remains patent in the mesentery. Renals: Main renal  arteries and accessory right renal artery to the upper pole all remain patent with mild atherosclerotic origins. IMA: Atherosclerotic origin but remains patent off of the distal aorta anteriorly. RIGHT Lower Extremity Inflow: Heavily calcified right iliac system. Right common and external iliac arteries remain patent with mild luminal narrowings and small caliber but no occlusion. 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 Outflow: Right common femoral artery is atherosclerotic but patent. Right profunda femoral artery remains patent. Chronic occlusion of the right SFA. Right above knee popliteal artery is reconstituted but very small in caliber with calcific atherosclerosis and luminal narrowings. Right popliteal artery remains patent across the knee. Runoff: Dominant runoff vessel to the right lower extremity is the peroneal artery which reconstitutes the dorsalis pedis artery peripherally. Anterior tibial and posterior tibial arteries are very small in caliber and diffusely diseased. LEFT Lower Extremity Inflow: Chronic occlusion of the left common, internal and external iliac arteries. Outflow: Left common femoral artery is reconstituted by inferior epigastric collateral pathways. Left profunda femoral artery remains patent. Left SFA demonstrates severe disease proximally with multifocal stenoses and a very small patent lumen throughout the thigh. However, left SFA appears to remain patent. Popliteal artery is small in caliber but patent across the left knee. Runoff: Below the knee, the tibioperoneal trunk is heavily disease. Dominant left lower extremity runoff is via the left posterior tibial artery into the foot. Left peroneal and anterior tibial arteries are diminutive in caliber and heavily disease. Veins: Venous imaging not performed. Review of the MIP images confirms the above findings. NON-VASCULAR Lower chest: Minor dependent bibasilar atelectasis. Normal heart size. No pericardial pleural  effusion. Very  large hiatal hernia. Hepatobiliary: No focal liver abnormality is seen. No gallstones, gallbladder wall thickening, or biliary dilatation. Pancreas: Unremarkable. No pancreatic ductal dilatation or surrounding inflammatory changes. Spleen: Spleen is normal in size. Calcified splenic granulomata evident. Adrenals/Urinary Tract: Adrenal glands are unremarkable. Kidneys are normal, without renal calculi, focal lesion, or hydronephrosis. Bladder is unremarkable. Stomach/Bowel: Large hiatal hernia again noted. Negative bowel obstruction, significant dilatation, ileus, or free air. Normal appendix. Diffuse colonic diverticulosis, most severe in the sigmoid and rectum. No acute inflammatory process. No free fluid or abscess. Lymphatic: No adenopathy. Reproductive: No acute finding. Prostate normal in size. Seminal vesicles unremarkable. Other: Small fat containing left inguinal hernia. Small midline fat containing ventral hernia above the umbilicus. Small fat containing left umbilical hernia as well. Musculoskeletal: Minor diffuse thoracolumbar degenerative spondylosis. Facets are aligned. No compression fracture or acute osseous finding. IMPRESSION: VASCULAR Aortic atherosclerosis without aortic occlusion, dissection or aneurysm. 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 Right above knee popliteal artery is reconstituted via profunda femoral collaterals. Popliteal artery is diseased across the knee with preserved single vessel runoff via the right peroneal artery. Left common femoral artery is reconstituted through the inferior epigastric pathways. Left profunda femoral artery remains patent. Severely diseased diffuse left SFA disease without occlusion but with multifocal tandem stenoses. Left popliteal artery is diseased but patent across the knee. Dominant  single vessel runoff to the left lower extremity is the left posterior tibial artery. NON-VASCULAR Large hiatal hernia Splenic granulomata Colonic diverticulosis Fat containing ventral hernia, umbilical hernia, and left inguinal hernia. Electronically Signed   By: Judie PetitM.  Shick M.D.   On: 12/31/2016 17:00   [place] Arterial Seg Single  Result Date: 12/30/2016 CLINICAL DATA:  Claudication right worse than left after 50 yards. Tobacco abuse. EXAM: NONINVASIVE PHYSIOLOGIC VASCULAR STUDY OF BILATERAL LOWER EXTREMITIES TECHNIQUE: Evaluation of both lower extremities were performed at rest, including calculation of ankle-brachial indices with single level Doppler, pressure and pulse volume recording. COMPARISON:  None. 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 Should the patient fail conservative treatment, consider CTA runoff (higher spatial resolution) or MRA runoff (no radiation risk, can be performed noncontrast in the setting of renal dysfunction) to better define the site and nature of arterial occlusive disease and delineate treatment options. Electronically Signed   By: Corlis Leak  Hassell M.D.   On: 12/30/2016 14:21   Dg Foot Complete Left  Result Date: 12/29/2016 CLINICAL DATA:  Left foot wound, pain, swelling and redness. EXAM: LEFT FOOT - COMPLETE 3+ VIEW COMPARISON:  None available FINDINGS: Left first MTP joint demonstrates degenerative osteoarthritis with associated hallux valgus deformity, joint space loss, sclerosis and bony spurring. No acute osseous finding or fracture. Dorsal foot soft tissue swelling on the lateral view. No visualized radiopaque or metallic foreign body. No plain radiographic evidence of osseous destruction or bone loss. 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 Electronically Signed   By: Judie PetitM.  Shick M.D.   On: 12/29/2016 20:05   Anti-infectives: Anti-infectives    Start     Dose/Rate Route Frequency Ordered Stop   01/07/17 0600  ceFAZolin (ANCEF) IVPB 1 g/50 mL  premix    Comments:  Send with pt to OR   1 g 100 mL/hr over 30 Minutes Intravenous To Short Stay 01/06/17 0727 01/08/17 0600   01/03/17 1400  ceFAZolin (ANCEF) IVPB 2g/100 mL premix     2 g 200 mL/hr over 30 Minutes Intravenous Every 8 hours 01/03/17 1058     12/30/16 1600  vancomycin (VANCOCIN) 1,500 mg in sodium chloride 0.9 % 500 mL IVPB  Status:  Discontinued     1,500 mg 250 mL/hr over 120 Minutes Intravenous Every 12 hours 12/30/16 0834 12/31/16 1413   12/30/16 0800  vancomycin (VANCOCIN) IVPB 1000 mg/200 mL premix  Status:  Discontinued     1,000 mg 200 mL/hr over 60 Minutes Intravenous  Once 12/30/16 0134 12/30/16 0834   12/29/16 1930  vancomycin (VANCOCIN) IVPB 1000 mg/200 mL premix     1,000 mg 200 mL/hr over 60 Minutes Intravenous  Once 12/29/16 1927 12/29/16 2110      Assessment/Plan: s/p Procedure(s): AORTOBIFEMORAL BYPASS GRAFT (Bilateral) Plan for OR tomorrow. Again discussed the procedure aortofemoral bypass grafting and expected postoperative course.   LOS: 8 days   Gretta Began 01/06/2017, 8:36 AM

## 2017-01-07 NOTE — Op Note (Signed)
OPERATIVE REPORT  DATE OF SURGERY: 01/07/2017  PATIENT: Ian Wong, 56 y.o. 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  PHYSICIAN ASSISTANT: Samantha Rhyne PA-C  ANESTHESIA:  Gen.  EBL: 300 ml  No intake/output data recorded.  BLOOD ADMINISTERED: 100 cc Cell Saver  DRAINS: None  SPECIMEN: None  COUNTS CORRECT:  YES  PLAN OF CARE: PACU stable extubated   PATIENT DISPOSITION:  PACU - hemodynamically stable  PROCEDURE DETAILS: The patient was taken to the operative placed supine position where the area the abdomen both groins were prepped and draped in usual sterile fashion. Syndrome made over both groins and carried down through the subcutaneous fat to the level of the inguinal ligament. Patient had very small arteries. The common femoral artery was encircled at the inguinal ligament bilaterally. Dissection was tented down onto the bifurcation in the 2 fish femoral artery and profundus reverse arteries were controlled with vessel loops bilaterally. Tributary branches were controlled with 2-0 silk Potts ties. Next incision was made from the level of xiphoid to below the umbilicus and carried down to the midline fat with electrocautery. The linea alba was opened with electrocautery. The patient had a moderate-sized umbilical hernia and the hernia sac was reduced and resected in the fascia was mobile was freed up at the edges of the hernia defect. The peritoneum was entered. The liver and gallbladder small and large bowel are normal. The transverse colon omentum reflected superiorly and the small bowel was reflected to the right. Retrograde peritoneum was opened over the aorta and the aorta was encircled below the level of the renal arteries at the level of the left renal vein. The artery  was very small and calcified. The patient did have a patent inferior mesenteric artery and occlusion of his hypogastric arteries bilaterally by preoperative studies. The left external iliac artery was completely occluded and there was flow in the common and external iliac artery on the right but this was extremely diseased. [place] due to concern of maintaining patency of the inferior mesenteric artery decision was made to anastomose end-to-side aortic anastomosis. A tunnel was created taking care to pass behind the level of the ureters bilaterally from the aortic bifurcation down to the groins. Patient was given 8000 units of heparin and 25 g of mannitol. After adequate circulation time the aorta was occluded just below the level renal arteries with a Hartmann clamp and the aorta was occluded below the level of the intermesenteric artery with a DeBakey clamp. The inferior mesenteric artery was occluded with a Serafin clamp. The aorta was opened with 11 blade some ulcerative with Potts scissors. A 14 x 8 Hemashield graft was brought onto the field was spatulated and sewn end-to-side to the aorta with a running 3-0 Prolene suture. After the usual flushing maneuvers anastomose stenosis was completed and clamps were placed on the graft and removed from the aorta. The right and left limb of the graft was then brought to the respective groins. The common superficial femoral and profundus femoris arteries were occluded bilaterally. The common femoral artery was opened and extended down onto the superficial femoral artery with Potts scissors. The graft was spatulated and sewn end-to-side to the common femoral superficial femoral artery junction with a running 5-0 Prolene sutures. Prior to completion of the closure the usual flushing  maneuvers were undertaken. Anastomosis were completed and flow was restored to both legs. The patient was given 50 mg of protamine to reverse heparin. Her again with saline. Hemostasis tablet  cautery. The groins were closed with several layers of 2-0 Vicryl in the subcutaneous tissue and skin was closed with 3-0 subcuticular Vicryl suture. The retroperitoneum was closed over the aortic graft to exclude the graft from the duodenum with a running 2-0 Vicryl suture. The small bowel was run in its entirety and found to be without injury was placed back in the pelvis. Transverse colon omentum replaced over this. There was some clotted blood in the left upper quadrant. This was irrigated and there was no active bleeding. The midline fascia was closed with a #1 PDS suture beginning proximally and distally and tying in the middle. Skin was closed with 3 or septic or Vicryl stitch. The patient was extubated in the operating room and transferred to the recovery room in stable condition   Larina Earthly, M.D., [facility] 01/07/2017 7:27 PM

## 2017-01-07 NOTE — Transfer of Care (Signed)
Immediate Anesthesia Transfer of Care Note  Patient: Ian Wong  Procedure(s) Performed: Procedure(s): AORTOBIFEMORAL BYPASS GRAFT (Bilateral)  Patient Location: PACU  Anesthesia Type:General  Level of Consciousness: awake, alert  and oriented  Airway & Oxygen Therapy: Patient Spontanous Breathing and Patient connected to nasal cannula oxygen  Post-op Assessment: Report given to RN and Post -op Vital signs reviewed and stable  Post vital signs: Reviewed and stable  Last Vitals:  Vitals:   01/06/17 2328 01/07/17 0652  BP: 129/62 (!) 130/50  Pulse: 100 86  Resp: 20 18  Temp: 36.8 C 36.7 C    Last Pain:  Vitals:   01/07/17 0652  TempSrc: Oral  PainSc:       Patients Stated Pain Goal: 2 (01/05/17 2315)  Complications: No apparent anesthesia complications

## 2017-01-07 NOTE — Anesthesia Postprocedure Evaluation (Addendum)
Anesthesia Post Note  Patient: Ian Wong  Procedure(s) Performed: Procedure(s) (LRB): AORTOBIFEMORAL BYPASS GRAFT (Bilateral)  Patient location during evaluation: PACU Anesthesia Type: General Level of consciousness: awake and alert Pain management: pain level controlled Vital Signs Assessment: post-procedure vital signs reviewed and stable Respiratory status: spontaneous breathing, nonlabored ventilation, respiratory function stable and patient connected to nasal cannula oxygen Cardiovascular status: blood pressure returned to baseline and stable Postop Assessment: no signs of nausea or vomiting Anesthetic complications: no       Last Vitals:  Vitals:   01/07/17 1400 01/07/17 1425  BP:    Pulse: 91 91  Resp: 16 16  Temp:  36.1 C    Last Pain:  Vitals:   01/07/17 1320  TempSrc:   PainSc: 10-Worst pain ever                 Zalan Shidler,JAMES TERRILL

## 2017-01-08 ENCOUNTER — Encounter (HOSPITAL_COMMUNITY): Payer: Self-pay | Admitting: Vascular Surgery

## 2017-01-08 ENCOUNTER — Inpatient Hospital Stay (HOSPITAL_COMMUNITY): Payer: Self-pay

## 2017-01-08 LAB — GLUCOSE, CAPILLARY
GLUCOSE-CAPILLARY: 138 mg/dL — AB (ref 65–99)
GLUCOSE-CAPILLARY: 115 mg/dL — AB (ref 65–99)
Glucose-Capillary: 130 mg/dL — ABNORMAL HIGH (ref 65–99)
Glucose-Capillary: 161 mg/dL — ABNORMAL HIGH (ref 65–99)

## 2017-01-08 LAB — POCT I-STAT 7, (LYTES, BLD GAS, ICA,H+H)
BICARBONATE: 26 mmol/L (ref 20.0–28.0)
Calcium, Ion: 1.14 mmol/L — ABNORMAL LOW (ref 1.15–1.40)
HCT: 32 % — ABNORMAL LOW (ref 39.0–52.0)
Hemoglobin: 10.9 g/dL — ABNORMAL LOW (ref 13.0–17.0)
O2 SAT: 100 %
PCO2 ART: 46.1 mmHg (ref 32.0–48.0)
PO2 ART: 175 mmHg — AB (ref 83.0–108.0)
Patient temperature: 36.5
Potassium: 4.2 mmol/L (ref 3.5–5.1)
SODIUM: 136 mmol/L (ref 135–145)
TCO2: 27 mmol/L (ref 0–100)
pH, Arterial: 7.358 (ref 7.350–7.450)

## 2017-01-08 LAB — COMPREHENSIVE METABOLIC PANEL
ALBUMIN: 3.3 g/dL — AB (ref 3.5–5.0)
ALK PHOS: 51 U/L (ref 38–126)
ALT: 18 U/L (ref 17–63)
ANION GAP: 8 (ref 5–15)
AST: 26 U/L (ref 15–41)
BILIRUBIN TOTAL: 0.7 mg/dL (ref 0.3–1.2)
BUN: 12 mg/dL (ref 6–20)
CALCIUM: 8.3 mg/dL — AB (ref 8.9–10.3)
CO2: 22 mmol/L (ref 22–32)
Chloride: 101 mmol/L (ref 101–111)
Creatinine, Ser: 0.79 mg/dL (ref 0.61–1.24)
GFR calc non Af Amer: >60 mL/min (ref >60)
GLUCOSE: 179 mg/dL — AB (ref 65–99)
POTASSIUM: 4.4 mmol/L (ref 3.5–5.1)
SODIUM: 131 mmol/L — AB (ref 135–145)
Total Protein: 5.9 g/dL — ABNORMAL LOW (ref 6.5–8.1)

## 2017-01-08 LAB — CBC
HCT: 36.3 % — ABNORMAL LOW (ref 39.0–52.0)
HEMOGLOBIN: 12.8 g/dL — AB (ref 13.0–17.0)
MCH: 31.6 pg (ref 26.0–34.0)
MCHC: 35.3 g/dL (ref 30.0–36.0)
MCV: 89.6 fL (ref 78.0–100.0)
Platelets: 280 10*3/uL (ref 150–400)
RBC: 4.05 MIL/uL — ABNORMAL LOW (ref 4.22–5.81)
RDW: 12.8 % (ref 11.5–15.5)
WBC: 15.4 10*3/uL — AB (ref 4.0–10.5)

## 2017-01-08 LAB — PROTIME-INR
INR: 1.11
PROTHROMBIN TIME: 14.3 s (ref 11.4–15.2)

## 2017-01-08 LAB — AMYLASE: AMYLASE: 23 U/L — AB (ref 28–100)

## 2017-01-08 LAB — MAGNESIUM: MAGNESIUM: 2 mg/dL (ref 1.7–2.4)

## 2017-01-08 MED ORDER — BISACODYL 10 MG RE SUPP
10.0000 mg | Freq: Once | RECTAL | Status: AC
  Administered 2017-01-08: 10 mg via RECTAL
  Filled 2017-01-08: qty 1

## 2017-01-08 MED ORDER — INSULIN ASPART 100 UNIT/ML ~~LOC~~ SOLN
0.0000 [IU] | Freq: Three times a day (TID) | SUBCUTANEOUS | Status: DC
  Administered 2017-01-08 – 2017-01-10 (×4): 2 [IU] via SUBCUTANEOUS

## 2017-01-08 MED ORDER — SODIUM CHLORIDE 0.9 % IV SOLN
INTRAVENOUS | Status: DC
  Administered 2017-01-08 – 2017-01-14 (×12): via INTRAVENOUS

## 2017-01-08 MED FILL — Heparin Sodium (Porcine) Inj 1000 Unit/ML: INTRAMUSCULAR | Qty: 30 | Status: AC

## 2017-01-08 MED FILL — Sodium Chloride IV Soln 0.9%: INTRAVENOUS | Qty: 1000 | Status: AC

## 2017-01-08 NOTE — Evaluation (Signed)
Physical Therapy Evaluation Patient Details Name: Ian Wong MRN: [id number] DOB: 05/29/1961 Today's Date: 01/08/2017   History of Present Illness  56 yo admitted with LLE cellulitis with bil LE claudication s/p aortobifem BPG. PMHx: tobacco use  Clinical Impression  Pt very pleasant and willing to mobilize despite discomfort. Pt has assist of family at home as needed and is motivated to return to work. Pt with decreased activity tolerance, gait and mobility limited by pain who will benefit from acute therapy to maximize mobility, function and gait to decrease burden of care and return pt to PLOF. Recommend daily ambulation with nursing staff.   HR 101-110 sats 92-97% on RA    Follow Up Recommendations No PT follow up    Equipment Recommendations  Rolling walker with 5" wheels    Recommendations for Other Services OT consult     Precautions / Restrictions Precautions Precautions: Fall Restrictions Weight Bearing Restrictions: No      Mobility  Bed Mobility               General bed mobility comments: in chair on arrival  Transfers Overall transfer level: Needs assistance   Transfers: Sit to/from Stand Sit to Stand: Min guard         General transfer comment: cues for hand placement, safety and positioning  Ambulation/Gait Ambulation/Gait assistance: Min guard Ambulation Distance (Feet): 300 Feet Assistive device: Rolling walker (2 wheeled) Gait Pattern/deviations: Step-through pattern;Decreased stride length;Trunk flexed   Gait velocity interpretation: Below normal speed for age/gender General Gait Details: cues for posture and position in RW  Stairs            Wheelchair Mobility    Modified Rankin (Stroke Patients Only)       Balance Overall balance assessment: No apparent balance deficits (not formally assessed)                                           Pertinent Vitals/Pain Pain Assessment: 0-10 Pain Score: 5   Pain Location: abdomen Pain Descriptors / Indicators: Operative site guarding Pain Intervention(s): Limited activity within patient's tolerance;Repositioned;Premedicated before session;Monitored during session    Home Living Family/patient expects to be discharged to:: Private residence Living Arrangements: Spouse/significant other;Children Available Help at Discharge: Family;Available 24 hours/day Type of Home: Mobile home Home Access: Ramped entrance     Home Layout: One level Home Equipment: None Additional Comments: wife is home with son with physical disability during the day    Prior Function Level of Independence: Independent               Hand Dominance        Extremity/Trunk Assessment   Upper Extremity Assessment Upper Extremity Assessment: Overall WFL for tasks assessed    Lower Extremity Assessment Lower Extremity Assessment: Overall WFL for tasks assessed    Cervical / Trunk Assessment Cervical / Trunk Assessment: Normal (tendency for flexion due to pain)  Communication   Communication: No difficulties  Cognition Arousal/Alertness: Awake/alert Behavior During Therapy: WFL for tasks assessed/performed Overall Cognitive Status: Within Functional Limits for tasks assessed                      General Comments      Exercises     Assessment/Plan    PT Assessment Patient needs continued PT services  PT Problem List Decreased mobility;Decreased activity tolerance;Decreased knowledge  of use of DME;Pain          PT Treatment Interventions Gait training;Therapeutic exercise;Therapeutic activities;DME instruction;Functional mobility training;Patient/family education    PT Goals (Current goals can be found in the Care Plan section)  Acute Rehab PT Goals 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 PT Goal Formulation: With patient Time For Goal Achievement: 01/22/17 Potential to Achieve Goals: Good    Frequency Min  3X/week   Barriers to discharge        Co-evaluation               End of Session Equipment Utilized During Treatment: Gait belt Activity Tolerance: Patient tolerated treatment well Patient left: in chair;with call bell/phone within reach Nurse Communication: Mobility status         Time: 0742-0807 PT Time Calculation (min) (ACUTE ONLY): 25 min   Charges:   PT Evaluation $PT Eval Moderate Complexity: 1 Procedure     PT G Codes:        Ian Wong B Arash Karstens 01/14/2017, 9:49 AM Delaney Meigs, PT [phone]

## 2017-01-08 NOTE — Progress Notes (Signed)
Pt transferred to [address] via wheelchair, vss, pt transferred to bathroom upon arrival with receiving RN at bedside, all pt belongings at pt side, family made aware of transfer.  Louie BunWilson,Delmos Velaquez S 1:02 PM

## 2017-01-08 NOTE — Progress Notes (Addendum)
  AAA Progress Note    01/08/2017 7:19 AM 1 Day Post-Op  Subjective:  C/o soreness  Afebrile HR  80's-90's NSR 110's-140's systolic 92% 2LO2NC  Gtts:  none  Vitals:   01/08/17 0500 01/08/17 0600  BP: 137/69 (!) 147/67  Pulse: 94 97  Resp: 18 19  Temp:      Physical Exam: Cardiac:  regular Lungs:  Non labored Abdomen:  Distended; -BS; -flatus Incisions:  Bilateral groin incisions are c/d/i; some bloody drainage from distal laparotomy incision Extremities:  +doppler signal right DP/PT/peroneal and left PT/peroneal  CBC    Component Value Date/Time   WBC 15.4 (H) 01/08/2017 0340   RBC 4.05 (L) 01/08/2017 0340   HGB 12.8 (L) 01/08/2017 0340   HCT 36.3 (L) 01/08/2017 0340   PLT 280 01/08/2017 0340   MCV 89.6 01/08/2017 0340   MCH 31.6 01/08/2017 0340   MCHC 35.3 01/08/2017 0340   RDW 12.8 01/08/2017 0340   LYMPHSABS 1.9 12/29/2016 1543   MONOABS 1.5 (H) 12/29/2016 1543   EOSABS 0.1 12/29/2016 1543   BASOSABS 0.0 12/29/2016 1543    BMET    Component Value Date/Time   NA 131 (L) 01/08/2017 0340   K 4.4 01/08/2017 0340   CL 101 01/08/2017 0340   CO2 22 01/08/2017 0340   GLUCOSE 179 (H) 01/08/2017 0340   BUN 12 01/08/2017 0340   CREATININE 0.79 01/08/2017 0340   CALCIUM 8.3 (L) 01/08/2017 0340   GFRNONAA >60 01/08/2017 0340   GFRAA >60 01/08/2017 0340    INR    Component Value Date/Time   INR 1.11 01/08/2017 0340     Intake/Output Summary (Last 24 hours) at 01/08/17 0719 Last data filed at 01/08/17 0700  Gross per 24 hour  Intake          4981.67 ml  Output             1695 ml  Net          3286.67 ml   NGT output:  195cc/24hr   Assessment/Plan:  56 y.o. male is s/p  Aortobifemoral bypass grafting 1 Day Post-Op  -pt with +doppler flow bilateral feet -mild hyponatremia and hyperglycemia-will change IVF to NaCl -leukocytosis improving -labs in am -d/c NGT and a-line -mobilize -dulcolax supp -transfer to [address]   Doreatha MassedSamantha Rhyne,  [place] JerseyPA-C [facility] [phone] 01/08/2017 7:19 AM  I have examined the patient, reviewed and agree with above. Up in chair this morning. Expected amount of soreness. Minimal NG output will discontinue. Mobilize. Pulmonary toilet.  Gretta BeganEarly, [place]ld[place], MD 01/08/2017 10:19 AM

## 2017-01-08 NOTE — Evaluation (Signed)
Occupational Therapy Evaluation Patient Details Name: Ian Wong MRN: [id number] DOB: October 29, 1961 Today's Date: 01/08/2017    History of Present Illness 56 yo admitted with LLE cellulitis with bil LE claudication s/p aortobifem BPG. PMHx: tobacco use   Clinical Impression   Pt reports he was independent with ADL PTA. Currently pt overall min guard for ADL and functional mobility. HR in 120s with activity; SpO2=91% on RA. Began education on energy conservation and breathing strategies. Pt planning to d/c home with 24/7 supervision from family. Pt would benefit from continued skilled OT to address established goals.    Follow Up Recommendations  No OT follow up;Supervision/Assistance - 24 hour    Equipment Recommendations  Tub/shower seat    Recommendations for Other Services       Precautions / Restrictions Precautions Precautions: Fall Restrictions Weight Bearing Restrictions: No      Mobility Bed Mobility Overal bed mobility: Needs Assistance Bed Mobility: Supine to Sit;Sit to Supine     Supine to sit: Min assist;HOB elevated Sit to supine: Min assist   General bed mobility comments: min assist for trunk elevation to sitting. Cues for hand placement and technique. Assist for LLE back to bed  Transfers Overall transfer level: Needs assistance Equipment used: Rolling walker (2 wheeled) Transfers: Sit to/from Stand Sit to Stand: Min guard         General transfer comment: Good hand placement and technique with RW    Balance Overall balance assessment: Needs assistance Sitting-balance support: Feet supported;No upper extremity supported Sitting balance-Leahy Scale: Good     Standing balance support: No upper extremity supported;During functional activity Standing balance-Leahy Scale: Fair                              ADL Overall ADL's : Needs assistance/impaired Eating/Feeding: NPO   Grooming: Min guard;Wash/dry hands;Oral  care;Standing   Upper Body Bathing: Supervision/ safety;Sitting   Lower Body Bathing: Minimal assistance;Sit to/from stand   Upper Body Dressing : Supervision/safety;Sitting   Lower Body Dressing: Min guard;Sit to/from stand Lower Body Dressing Details (indicate cue type and reason): able to adjust socks in sitting Toilet Transfer: Min guard;Ambulation;Regular Toilet;RW   Toileting- Architect and Hygiene: Min guard;Sit to/from stand       Functional mobility during ADLs: Min guard;Rolling walker General ADL Comments: HR 120s following activity, SpO2 91% on RA. Educated on pursed lip breathing.     Vision     Perception     Praxis      Pertinent Vitals/Pain Pain Assessment: 0-10 Pain Score: 5  Pain Location: stomach Pain Descriptors / Indicators: Aching;Operative site guarding;Sore Pain Intervention(s): Monitored during session;Premedicated before session     Hand Dominance     Extremity/Trunk Assessment Upper Extremity Assessment Upper Extremity Assessment: Overall WFL for tasks assessed   Lower Extremity Assessment Lower Extremity Assessment: Defer to PT evaluation   Cervical / Trunk Assessment Cervical / Trunk Assessment: Normal   Communication Communication Communication: No difficulties   Cognition Arousal/Alertness: Awake/alert Behavior During Therapy: WFL for tasks assessed/performed Overall Cognitive Status: Within Functional Limits for tasks assessed                     General Comments       Exercises       Shoulder Instructions      Home Living Family/patient expects to be discharged to:: Private residence Living Arrangements: Spouse/significant other;Children Available Help at  Discharge: Family;Available 24 hours/day Type of Home: Mobile home Home Access: Ramped entrance     Home Layout: One level     Bathroom Shower/Tub: Tub/shower unit Shower/tub characteristics: Engineer, building servicesCurtain Bathroom Toilet: Standard     Home  Equipment: None   Additional Comments: wife is home with son with physical disability during the day      Prior Functioning/Environment Level of Independence: Independent        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        OT Problem List: Decreased activity tolerance;Impaired balance (sitting and/or standing);Decreased knowledge of use of DME or AE;Decreased knowledge of precautions;Pain   OT Treatment/Interventions: Self-care/ADL training;Energy conservation;Therapeutic activities;Patient/family education    OT Goals(Current goals can be found in the care plan section) Acute Rehab OT Goals 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 OT Goal Formulation: With patient Time For Goal Achievement: 01/22/17 Potential to Achieve Goals: Good ADL Goals Pt Will Perform Lower Body Bathing: with modified independence;sit to/from stand Pt Will Perform Lower Body Dressing: with modified independence;sit to/from stand Pt Will Transfer to Toilet: with modified independence;ambulating;regular height toilet Pt Will Perform Toileting - Clothing Manipulation and hygiene: with modified independence;sit to/from stand Pt Will Perform Tub/Shower Transfer: Tub transfer;with supervision;ambulating;shower seat;rolling walker Additional ADL Goal #1: Pt will independently verbally recall 3 energy conservation strategies.  OT Frequency: Min 2X/week   Barriers to D/C:            Co-evaluation              End of Session Equipment Utilized During Treatment: Engineer, waterolling walker Nurse Communication: Mobility status;Other (comment) (vitals, pt able to have BM)  Activity Tolerance: Patient tolerated treatment well Patient left: in bed;with call bell/phone within reach   Time: [id number] OT Time Calculation (min): 30 min Charges:  OT General Charges $OT Visit: 1 Procedure OT Evaluation $OT Eval Moderate Complexity: 1 Procedure OT Treatments $Self Care/Home Management : 8-22 mins G-Codes:      Gaye AlkenBailey A Sorin Frimpong M.S., OTR/L Pager: [phone]  01/08/2017, 4:21 PM

## 2017-01-09 ENCOUNTER — Inpatient Hospital Stay (HOSPITAL_COMMUNITY): Payer: Self-pay

## 2017-01-09 LAB — CBC
HCT: 33.5 % — ABNORMAL LOW (ref 39.0–52.0)
HEMOGLOBIN: 11.4 g/dL — AB (ref 13.0–17.0)
MCH: 30.7 pg (ref 26.0–34.0)
MCHC: 34 g/dL (ref 30.0–36.0)
MCV: 90.3 fL (ref 78.0–100.0)
PLATELETS: 275 10*3/uL (ref 150–400)
RBC: 3.71 MIL/uL — AB (ref 4.22–5.81)
RDW: 12.7 % (ref 11.5–15.5)
WBC: 16.5 10*3/uL — AB (ref 4.0–10.5)

## 2017-01-09 LAB — BASIC METABOLIC PANEL
ANION GAP: 8 (ref 5–15)
BUN: 15 mg/dL (ref 6–20)
CHLORIDE: 102 mmol/L (ref 101–111)
CO2: 22 mmol/L (ref 22–32)
Calcium: 8.3 mg/dL — ABNORMAL LOW (ref 8.9–10.3)
Creatinine, Ser: 0.78 mg/dL (ref 0.61–1.24)
GFR calc Af Amer: >60 mL/min (ref >60)
GFR calc non Af Amer: >60 mL/min (ref >60)
GLUCOSE: 140 mg/dL — AB (ref 65–99)
POTASSIUM: 4 mmol/L (ref 3.5–5.1)
Sodium: 132 mmol/L — ABNORMAL LOW (ref 135–145)

## 2017-01-09 LAB — GLUCOSE, CAPILLARY
GLUCOSE-CAPILLARY: 137 mg/dL — AB (ref 65–99)
GLUCOSE-CAPILLARY: 132 mg/dL — AB (ref 65–99)
Glucose-Capillary: 120 mg/dL — ABNORMAL HIGH (ref 65–99)

## 2017-01-09 NOTE — Progress Notes (Signed)
OT Cancellation Note  Patient Details Name: Ian CordsRoger Allen Lavalley MRN: [id number] DOB: 11/28/1961   Cancelled Treatment:    Reason Eval/Treat Not Completed: Medical issues which prohibited therapy (per RN; pt with coffee ground emesis). Will follow up as time allows.  Gaye AlkenBailey A Shermika Balthaser M.S., OTR/L Pager: [phone]  01/09/2017, 10:24 AM

## 2017-01-09 NOTE — Progress Notes (Addendum)
  AAA Progress Note    01/09/2017 7:24 AM 2 Days Post-Op  Subjective:  Still having pain; says he's had some nausea and vomiting this morning.  Says he had a BM and passing gas.  Tm 98.9 HR 110's-120's systolic 110's-150's systolic 16%92% 2LO2NC  Vitals:   01/08/17 2329 01/09/17 0544  BP: (!) 119/53 (!) 133/50  Pulse: (!) 123 (!) 113  Resp:  18  Temp:  98.9 F (37.2 C)    Physical Exam: Cardiac:  regular Lungs:  Rales left base Abdomen:  Soft; mildly tender to palpation; +BS; +flatus; +BM Incisions:  Bilateral groins are clean and dry; distal laparotomy incision with mild serosanguinous/bloody drainage  Extremities:  Bilateral feet are warm and well perfused.  CBC    Component Value Date/Time   WBC 16.5 (H) 01/09/2017 0246   RBC 3.71 (L) 01/09/2017 0246   HGB 11.4 (L) 01/09/2017 0246   HCT 33.5 (L) 01/09/2017 0246   PLT 275 01/09/2017 0246   MCV 90.3 01/09/2017 0246   MCH 30.7 01/09/2017 0246   MCHC 34.0 01/09/2017 0246   RDW 12.7 01/09/2017 0246   LYMPHSABS 1.9 12/29/2016 1543   MONOABS 1.5 (H) 12/29/2016 1543   EOSABS 0.1 12/29/2016 1543   BASOSABS 0.0 12/29/2016 1543    BMET    Component Value Date/Time   NA 132 (L) 01/09/2017 0246   K 4.0 01/09/2017 0246   CL 102 01/09/2017 0246   CO2 22 01/09/2017 0246   GLUCOSE 140 (H) 01/09/2017 0246   BUN 15 01/09/2017 0246   CREATININE 0.78 01/09/2017 0246   CALCIUM 8.3 (L) 01/09/2017 0246   GFRNONAA >60 01/09/2017 0246   GFRAA >60 01/09/2017 0246    INR    Component Value Date/Time   INR 1.11 01/08/2017 0340     Intake/Output Summary (Last 24 hours) at 01/09/17 0724 Last data filed at 01/08/17 1700  Gross per 24 hour  Intake              505 ml  Output              142 ml  Net              363 ml     Assessment/Plan:  56 y.o. male is s/p  Aortobifemoral bypass grafting 2 Days Post-Op  -pt doing well overall; he did have nausea & some vomiting this morning.  He is passing flatus, has +BS and has  had a BM.  Continue npo.  Asked him to hold off on the ice chips for now. -drainage from distal laparotomy incision is mild considering the bandage was placed yesterday.  -worked with PT and did well-continue mobilization -hyponatremia slightly improved -slight increase in leukocytosis (15.4k to 16.5k today); pt is afebrile.  Continue to monitor -hyperglycemia improved from yesterday as well -continue to encourage IS   Doreatha MassedSamantha Rhyne, PA-C [facility] [phone] 01/09/2017 7:24 AM   I have examined the patient, reviewed and agree with above. Nausea has improved. Abdomen soft and nontender. Left dorsum of his foot and lateral wounds are healing. The dry eschar is present on the dorsum of his foot. Palpable left posterior tibial pulse.  Abdominal film showed diffuse ileus pattern.  Postop day 2 with ileus. Explained may need to replace NG tube if he has further vomiting. Splane need for mobilization and walking. Gas with nursing staff need for mobilization  Gretta BeganEarly, Kiyah Demartini, MD 01/09/2017 1:27 PM

## 2017-01-09 NOTE — Progress Notes (Signed)
Physical Therapy Treatment Patient Details Name: Ian Wong MRN: [id number] DOB: 03/13/1961 Today's Date: 01/09/2017    History of Present Illness 56 yo admitted with LLE cellulitis with bil LE claudication s/p aortobifem BPG. PMHx: tobacco use    PT Comments    Pt's o2 sats dropping today after ambulating on 2 L/min, which is a change from previous PT session.  Pt with very weak congested cough.  Pt educated on importance of cough with bracing, upright/OOB positioning, and incentive spirometer.  He and his wife verbalized understanding. Pt would benefit from frequent walks with nursing and discussed with his nurse.  Follow Up Recommendations  No PT follow up     Equipment Recommendations  Rolling walker with 5" wheels    Recommendations for Other Services OT consult     Precautions / Restrictions Precautions Precautions: Fall Precaution Comments: monitor o2 Restrictions Weight Bearing Restrictions: No    Mobility  Bed Mobility Overal bed mobility: Needs Assistance Bed Mobility: Rolling;Sidelying to Sit Rolling: Modified independent (Device/Increase time) Sidelying to sit: Min assist       General bed mobility comments: MIN A to get trunk upright  Transfers Overall transfer level: Needs assistance Equipment used: Rolling walker (2 wheeled) Transfers: Sit to/from Stand Sit to Stand: Min guard         General transfer comment: Good hand placement and technique with RW  Ambulation/Gait Ambulation/Gait assistance: Supervision Ambulation Distance (Feet): 200 Feet Assistive device: Rolling walker (2 wheeled) Gait Pattern/deviations: Step-through pattern;Trunk flexed     General Gait Details: Amb with o2 at 2 L/min and no c/o SOB during gait with 1 standing rest break.  UPon return to room, c/o SOB and o2 87% HR 122. Took 4-5 minutes of proper breathing techniques to get o2 >90% and HR down to 107.     Stairs            Wheelchair Mobility     Modified Rankin (Stroke Patients Only)       Balance   Sitting-balance support: Feet supported;No upper extremity supported Sitting balance-Leahy Scale: Good       Standing balance-Leahy Scale: Fair                      Cognition Arousal/Alertness: Awake/alert Behavior During Therapy: WFL for tasks assessed/performed Overall Cognitive Status: Within Functional Limits for tasks assessed                 General Comments: wife present    Exercises      General Comments General comments (skin integrity, edema, etc.): Pt with congested weak cough.  Educated on importance of bracing with pillow with coughing to get phlegm up.  Pt was able to do this and coughed up phelgm x 2 prior to o2 returning to 90's. Heavy education to pt and wife on importance of his sitting OOB/upright and deep breathing and coughing with bracing.  Pt performed incentive spirometer and educated on doing every hour.      Pertinent Vitals/Pain Pain Assessment: 0-10 Pain Score: 5  Pain Location: abd Pain Descriptors / Indicators: Grimacing;Operative site guarding;Sore Pain Intervention(s): Limited activity within patient's tolerance;Premedicated before session;Monitored during session;Repositioned    Home Living                      Prior Function            PT Goals (current goals can now be found in the care plan section)  Acute Rehab PT Goals 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 PT Goal Formulation: With patient Time For Goal Achievement: 01/22/17 Potential to Achieve Goals: Good Progress towards PT goals: Progressing toward goals    Frequency    Min 3X/week      PT Plan Current plan remains appropriate    Co-evaluation             End of Session Equipment Utilized During Treatment: Gait belt;Oxygen Activity Tolerance: Treatment limited secondary to medical complications (Comment) (de-sat) Patient left: in chair;with call bell/phone  within reach;with family/visitor present     Time: [id number] PT Time Calculation (min) (ACUTE ONLY): 39 min  Charges:  $Gait Training: 23-37 mins $Therapeutic Activity: 8-22 mins                    G Codes:      Ian Wong,Ian Wong 01/09/2017, 1:59 PM

## 2017-01-10 LAB — BASIC METABOLIC PANEL
ANION GAP: 7 (ref 5–15)
BUN: 22 mg/dL — AB (ref 6–20)
CO2: 24 mmol/L (ref 22–32)
Calcium: 8.4 mg/dL — ABNORMAL LOW (ref 8.9–10.3)
Chloride: 108 mmol/L (ref 101–111)
Creatinine, Ser: 0.72 mg/dL (ref 0.61–1.24)
GFR calc Af Amer: >60 mL/min (ref >60)
Glucose, Bld: 106 mg/dL — ABNORMAL HIGH (ref 65–99)
POTASSIUM: 3.9 mmol/L (ref 3.5–5.1)
SODIUM: 139 mmol/L (ref 135–145)

## 2017-01-10 LAB — GLUCOSE, CAPILLARY
GLUCOSE-CAPILLARY: 129 mg/dL — AB (ref 65–99)
GLUCOSE-CAPILLARY: 109 mg/dL — AB (ref 65–99)
GLUCOSE-CAPILLARY: 118 mg/dL — AB (ref 65–99)
GLUCOSE-CAPILLARY: 107 mg/dL — AB (ref 65–99)

## 2017-01-10 NOTE — Progress Notes (Signed)
  Progress Note    01/10/2017 7:40 AM 3 Days Post-Op  Subjective:  No complaints this a.m.  Vitals:   01/10/17 0004 01/10/17 0548  BP: 135/68 (!) 123/50  Pulse: (!) 104 (!) 107  Resp:  16  Temp:  98.5 F (36.9 C)    Physical Exam: aaox3 Abd is soft; mildly tender to palpation with + tympany Incisions in bilateral groins are clean and dry; distal laparotomy incision with mild serosanguinous/bloody drainage  Extremities:  Bilateral feet are warm and well perfused. With dressing on left foot   CBC    Component Value Date/Time   WBC 16.5 (H) 01/09/2017 0246   RBC 3.71 (L) 01/09/2017 0246   HGB 11.4 (L) 01/09/2017 0246   HCT 33.5 (L) 01/09/2017 0246   PLT 275 01/09/2017 0246   MCV 90.3 01/09/2017 0246   MCH 30.7 01/09/2017 0246   MCHC 34.0 01/09/2017 0246   RDW 12.7 01/09/2017 0246   LYMPHSABS 1.9 12/29/2016 1543   MONOABS 1.5 (H) 12/29/2016 1543   EOSABS 0.1 12/29/2016 1543   BASOSABS 0.0 12/29/2016 1543    BMET    Component Value Date/Time   NA 132 (L) 01/09/2017 0246   K 4.0 01/09/2017 0246   CL 102 01/09/2017 0246   CO2 22 01/09/2017 0246   GLUCOSE 140 (H) 01/09/2017 0246   BUN 15 01/09/2017 0246   CREATININE 0.78 01/09/2017 0246   CALCIUM 8.3 (L) 01/09/2017 0246   GFRNONAA >60 01/09/2017 0246   GFRAA >60 01/09/2017 0246    INR    Component Value Date/Time   INR 1.11 01/08/2017 0340     Intake/Output Summary (Last 24 hours) at 01/10/17 0740 Last data filed at 01/10/17 [id number]  Gross per 24 hour  Intake                0 ml  Output             1240 ml  Net            -1240 ml     Assessment:  56 y.o. male is s/p AoBF with distension in abdomen Plan: Mobilize today Check bmp for electrolytes Possible ng tube if further emesis   Aundraya Dripps C. Randie Heinzain, MD [facility] [place] Office: [phone] Pager: [phone]  01/10/2017 7:40 AM

## 2017-01-11 LAB — GLUCOSE, CAPILLARY
GLUCOSE-CAPILLARY: 101 mg/dL — AB (ref 65–99)
GLUCOSE-CAPILLARY: 107 mg/dL — AB (ref 65–99)
Glucose-Capillary: 112 mg/dL — ABNORMAL HIGH (ref 65–99)
Glucose-Capillary: 88 mg/dL (ref 65–99)

## 2017-01-11 NOTE — Progress Notes (Addendum)
[facility] Progress Note  Subjective  - POD #4  Denies abdominal pain and nausea. Passing flatus. No BM since Wednesday.   Objective Vitals:   01/11/17 0007 01/11/17 0445  BP: 122/66 (!) 135/56  Pulse: (!) 107 (!) 106  Resp: 20 20  Temp: 98.7 F (37.1 C) 99.1 F (37.3 C)    Intake/Output Summary (Last 24 hours) at 01/11/17 0828 Last data filed at 01/11/17 0023  Gross per 24 hour  Intake             1050 ml  Output                0 ml  Net             1050 ml   Abdomen is mildly distended but soft and non tender to palpation. Audible bowel sounds. Midline and bilateral groin incisions c/d/i Feet are warm bilaterally. Dressing to left leg and left foot clean.   Assessment/Planning: 56 y.o. male is s/p: aortobifemoral bypass 4 Days Post-Op   Post-op ileus: No further nausea or vomiting. Passing flatus. Has had ice chips without discomfort. Will ask Dr. Randie Heinz about slowly start clears.  Continue to mobilize. Incentive spirometry.   Raymond Gurney 01/11/2017 8:28 AM --  Laboratory CBC    Component Value Date/Time   WBC 16.5 (H) 01/09/2017 0246   HGB 11.4 (L) 01/09/2017 0246   HCT 33.5 (L) 01/09/2017 0246   PLT 275 01/09/2017 0246    BMET    Component Value Date/Time   NA 139 01/10/2017 1118   K 3.9 01/10/2017 1118   CL 108 01/10/2017 1118   CO2 24 01/10/2017 1118   GLUCOSE 106 (H) 01/10/2017 1118   BUN 22 (H) 01/10/2017 1118   CREATININE 0.72 01/10/2017 1118   CALCIUM 8.4 (L) 01/10/2017 1118   GFRNONAA >60 01/10/2017 1118   GFRAA >60 01/10/2017 1118    COAG Lab Results  Component Value Date   INR 1.11 01/08/2017   No results found for: PTT  Antibiotics Anti-infectives    Start     Dose/Rate Route Frequency Ordered Stop   01/07/17 0600  ceFAZolin (ANCEF) IVPB 1 g/50 mL premix  Status:  Discontinued    Comments:  Send with pt to OR   1 g 100 mL/hr over 30 Minutes Intravenous To Short Stay 01/06/17 0727 01/07/17 1432   01/03/17 1400  ceFAZolin (ANCEF) IVPB 2g/100 mL premix     2 g 200 mL/hr over 30 Minutes Intravenous Every 8 hours 01/03/17 1058     12/30/16 1600  vancomycin (VANCOCIN) 1,500 mg in sodium chloride 0.9 % 500 mL IVPB  Status:  Discontinued     1,500 mg 250 mL/hr over 120 Minutes Intravenous Every 12 hours 12/30/16 0834 12/31/16 1413   12/30/16 0800  vancomycin (VANCOCIN) IVPB 1000 mg/200 mL premix  Status:  Discontinued     1,000 mg 200 mL/hr over 60 Minutes Intravenous  Once 12/30/16 0134 12/30/16 0834   12/29/16 1930  vancomycin (VANCOCIN) IVPB 1000 mg/200 mL premix     1,000 mg 200 mL/hr over 60 Minutes Intravenous  Once 12/29/16 1927 12/29/16 2110       Maris Berger, PA-C [facility] Office: [phone] Pager: [phone] 01/11/2017 8:28 AM   I have independently interviewed patient and agree with PA assessment and plan above. Having flatus and distension improved with much softer abdomen. Will give clear liquids today but have instructed to take it slow and  stop if he develops nausea and avoid carbonation.   Diontre Harps C. Randie Heinzain, MD [facility] [place] Office: [phone] Pager: [phone]

## 2017-01-12 ENCOUNTER — Inpatient Hospital Stay (HOSPITAL_COMMUNITY): Payer: Self-pay

## 2017-01-12 LAB — CBC
HEMATOCRIT: 26.9 % — AB (ref 39.0–52.0)
HEMOGLOBIN: 8.9 g/dL — AB (ref 13.0–17.0)
MCH: 30.4 pg (ref 26.0–34.0)
MCHC: 33.1 g/dL (ref 30.0–36.0)
MCV: 91.8 fL (ref 78.0–100.0)
Platelets: 364 10*3/uL (ref 150–400)
RBC: 2.93 MIL/uL — AB (ref 4.22–5.81)
RDW: 13.1 % (ref 11.5–15.5)
WBC: 10 10*3/uL (ref 4.0–10.5)

## 2017-01-12 LAB — GLUCOSE, CAPILLARY
GLUCOSE-CAPILLARY: 101 mg/dL — AB (ref 65–99)
GLUCOSE-CAPILLARY: 91 mg/dL (ref 65–99)
Glucose-Capillary: 102 mg/dL — ABNORMAL HIGH (ref 65–99)
Glucose-Capillary: 85 mg/dL (ref 65–99)

## 2017-01-12 LAB — COMPREHENSIVE METABOLIC PANEL
ALK PHOS: 57 U/L (ref 38–126)
ALT: 12 U/L — AB (ref 17–63)
AST: 18 U/L (ref 15–41)
Albumin: 2.3 g/dL — ABNORMAL LOW (ref 3.5–5.0)
Anion gap: 8 (ref 5–15)
BILIRUBIN TOTAL: 1.5 mg/dL — AB (ref 0.3–1.2)
BUN: 15 mg/dL (ref 6–20)
CALCIUM: 8 mg/dL — AB (ref 8.9–10.3)
CHLORIDE: 106 mmol/L (ref 101–111)
CO2: 24 mmol/L (ref 22–32)
CREATININE: 0.81 mg/dL (ref 0.61–1.24)
Glucose, Bld: 100 mg/dL — ABNORMAL HIGH (ref 65–99)
Potassium: 3.6 mmol/L (ref 3.5–5.1)
Sodium: 138 mmol/L (ref 135–145)
Total Protein: 5.1 g/dL — ABNORMAL LOW (ref 6.5–8.1)

## 2017-01-12 LAB — CREATININE, SERUM
Creatinine, Ser: 0.74 mg/dL (ref 0.61–1.24)
GFR calc Af Amer: >60 mL/min (ref >60)

## 2017-01-12 MED ORDER — BISACODYL 10 MG RE SUPP
10.0000 mg | Freq: Once | RECTAL | Status: AC
  Administered 2017-01-12: 10 mg via RECTAL
  Filled 2017-01-12: qty 1

## 2017-01-12 NOTE — Progress Notes (Signed)
Patient ID: Ian Wong, male   DOB: 01/25/1961, 56 y.o.   MRN: [id number] No nausea currently. Had a bowel movement. Will continue to observe without NG decompression.

## 2017-01-12 NOTE — Progress Notes (Signed)
Occupational Therapy Treatment Patient Details Name: Ian Wong MRN: [id number] DOB: [birth year] Today's Date: 01/12/2017    History of present illness 56 yo admitted with LLE cellulitis with bil LE claudication s/p aortobifem BPG. PMHx: tobacco use   OT comments  Pt participated in ADL retraining session today with focus on simulated tub transfers (Min guard A) - he plans to sponge bath at home initially w/ PRN assist from his spouse. He was educated in energy conservation techniques and toilet transfer as well. Bed mobility and toilet transfers at supervision level today. Progressing toward OT goals.   Follow Up Recommendations  No OT follow up;Supervision/Assistance - 24 hour    Equipment Recommendations  Tub/shower seat    Recommendations for Other Services      Precautions / Restrictions Precautions Precautions: Fall Precaution Comments: monitor o2 Restrictions Weight Bearing Restrictions: No       Mobility Bed Mobility Overal bed mobility: Needs Assistance Bed Mobility: Supine to Sit;Sit to Supine     Supine to sit: Modified independent (Device/Increase time);HOB elevated Sit to supine: Modified independent (Device/Increase time);HOB elevated      Transfers Overall transfer level: Needs assistance Equipment used: Rolling walker (2 wheeled) Transfers: Sit to/from [employer] Sit to Stand: Supervision Stand pivot transfers: Supervision       General transfer comment: Good hand placement and technique with RW    Balance Overall balance assessment: Needs assistance Sitting-balance support: Feet supported;No upper extremity supported Sitting balance-Leahy Scale: Good     Standing balance support: No upper extremity supported;During functional activity Standing balance-Leahy Scale: Fair                     ADL Overall ADL's : Needs assistance/impaired                         Toilet Transfer:  Supervision/safety;RW;Ambulation;Grab bars   Toileting- Clothing Manipulation and Hygiene: Supervision/safety;Sit to/from stand   Tub/ Shower Transfer: Tub transfer;Min guard;Ambulation;Rolling walker (Simulated via stepping over trash can using RW)     General ADL Comments: Pt participated in ADL retraining session today to consist of toilet transfers, bed mobility, simulated tub transfer and he was also educated in energy conservation techniques. Pt was min guard assist during simulated tub transfer. He states that he plans to sponge bathe when he initially goes home and that he will also have PRN assistance from his spouse.       Vision                     Perception     Praxis      Cognition   Behavior During Therapy: WFL for tasks assessed/performed Overall Cognitive Status: Within Functional Limits for tasks assessed                       Extremity/Trunk Assessment               Exercises     Shoulder Instructions       General Comments      Pertinent Vitals/ Pain       Pain Assessment: No/denies pain Pain Score: 0-No pain  Home Living                                          Prior Functioning/Environment  Frequency  Min 2X/week        Progress Toward Goals  OT Goals(current goals can now be found in the care plan section)  Progress towards OT goals: Progressing toward goals     Plan Discharge plan remains appropriate    Co-evaluation                 End of Session Equipment Utilized During Treatment: Rolling walker   Activity Tolerance Patient tolerated treatment well   Patient Left in bed;with call bell/phone within reach   Nurse Communication          Time: 0801-0824 OT Time Calculation (min): 23 min  Charges: OT General Charges $OT Visit: 1 Procedure OT Treatments $Self Care/Home Management : 23-37 mins  Raedyn Wenke Beth Dixon, OTR/L 01/12/2017, 8:27 AM

## 2017-01-12 NOTE — Progress Notes (Addendum)
  AAA Progress Note    01/12/2017 7:21 AM 5 Days Post-Op  Subjective:  Says he threw up a little bit this morning (brownish black color).  Belching and hiccupping.  Denies BM since last week.  Walked 2x around the nursing station  Tm 99.2 now afebrile HR  90's-100's 110's'-130's systolic 95% RA  Vitals:   01/12/17 0038 01/12/17 0627  BP: (!) 130/58 (!) 124/58  Pulse:  (!) 103  Resp:    Temp:  98.4 F (36.9 C)    Physical Exam: Cardiac:  regular Lungs:  Coarse at bases bilaterally Abdomen:  +BS; +flatus; -BM; mild distention  Incisions:  Groins and laparotomy incisions are healing nicely Extremities:  Bilateral feet are warm and well perfused.  CBC    Component Value Date/Time   WBC 16.5 (H) 01/09/2017 0246   RBC 3.71 (L) 01/09/2017 0246   HGB 11.4 (L) 01/09/2017 0246   HCT 33.5 (L) 01/09/2017 0246   PLT 275 01/09/2017 0246   MCV 90.3 01/09/2017 0246   MCH 30.7 01/09/2017 0246   MCHC 34.0 01/09/2017 0246   RDW 12.7 01/09/2017 0246   LYMPHSABS 1.9 12/29/2016 1543   MONOABS 1.5 (H) 12/29/2016 1543   EOSABS 0.1 12/29/2016 1543   BASOSABS 0.0 12/29/2016 1543    BMET    Component Value Date/Time   NA 139 01/10/2017 1118   K 3.9 01/10/2017 1118   CL 108 01/10/2017 1118   CO2 24 01/10/2017 1118   GLUCOSE 106 (H) 01/10/2017 1118   BUN 22 (H) 01/10/2017 1118   CREATININE 0.74 01/12/2017 0422   CALCIUM 8.4 (L) 01/10/2017 1118   GFRNONAA >60 01/12/2017 0422   GFRAA >60 01/12/2017 0422    INR    Component Value Date/Time   INR 1.11 01/08/2017 0340     Intake/Output Summary (Last 24 hours) at 01/12/17 0721 Last data filed at 01/11/17 1540  Gross per 24 hour  Intake                0 ml  Output              850 ml  Net             -850 ml     Assessment/Plan:  56 y.o. male is s/p  Aortobifemoral bypass grafting 5 Days Post-Op  -pt with nausea/vomiting this morning-blackish brown.  He has not had a BM-will give a dulcolax suppository this am. -coarse  breath sounds at bases bilaterally.  Continue IS every hour -pt with belching and hiccups - ? Thorazine --will d/w Dr. Arbie CookeyEarly -will order KUB -will order labs also -continue to mobilize   Doreatha MassedSamantha Rhyne, PA-C [facility] [phone] 01/12/2017 7:21 AM  Some emesis earlier. He is comfortable currently. Feels as though he needs to have a bowel movement. Explained may need to replace NG tube. KUB continues to show ileus pattern. Will check later this afternoon

## 2017-01-12 NOTE — Progress Notes (Signed)
Physical Therapy Treatment Patient Details Name: Ian Wong MRN: [id number] DOB: 03/26/1961 Today's Date: 01/12/2017    History of Present Illness 56 yo admitted with LLE cellulitis with bil LE claudication s/p aortobifem BPG. PMHx: tobacco use    PT Comments    Pt pleasant, progressing well with gait and able to perform all bil LE HEP without difficulty or increased pain. Pt reports continued increased sensation bil LE and ability to ambulate with family. Pt encouraged to lie flat 3x/day to stretch abdomen to neutral and out of flexed posture. Will continue to follow.    Follow Up Recommendations  No PT follow up     Equipment Recommendations  Rolling walker with 5" wheels    Recommendations for Other Services       Precautions / Restrictions Precautions Precautions: Fall Precaution Comments: monitor o2 Restrictions Weight Bearing Restrictions: No    Mobility  Bed Mobility Overal bed mobility: Modified Independent Bed Mobility: Supine to Sit;Sit to Supine     Supine to sit: Modified independent (Device/Increase time);HOB elevated Sit to supine: Modified independent (Device/Increase time);HOB elevated      Transfers Overall transfer level: Modified independent Equipment used: Rolling walker (2 wheeled) Transfers: Sit to/from [employer]Stand;Stand Pivot Transfers Sit to Stand: Supervision Stand pivot transfers: Supervision       General transfer comment: Good hand placement and technique with RW  Ambulation/Gait Ambulation/Gait assistance: Supervision Ambulation Distance (Feet): 550 Feet Assistive device: Rolling walker (2 wheeled) Gait Pattern/deviations: Step-through pattern;Decreased stride length;Trunk flexed   Gait velocity interpretation: Below normal speed for age/gender General Gait Details: cues for posture, position in RW and safety. Pt without pain with increased distance and no SOB today   Stairs            Wheelchair Mobility     Modified Rankin (Stroke Patients Only)       Balance Overall balance assessment: Needs assistance Sitting-balance support: Feet supported;No upper extremity supported Sitting balance-Leahy Scale: Good     Standing balance support: No upper extremity supported;During functional activity Standing balance-Leahy Scale: Fair                      Cognition Arousal/Alertness: Awake/alert Behavior During Therapy: WFL for tasks assessed/performed Overall Cognitive Status: Within Functional Limits for tasks assessed                      Exercises General Exercises - Lower Extremity Long Arc Quad: AROM;Both;Seated;15 reps Hip Flexion/Marching: AROM;Both;Seated;15 reps Toe Raises: AROM;Both;Seated;15 reps Heel Raises: Both;Seated;AROM;15 reps    General Comments        Pertinent Vitals/Pain Pain Assessment: 0-10 Pain Score: 4  Pain Location: LLE Pain Descriptors / Indicators: Aching Pain Intervention(s): Limited activity within patient's tolerance;Monitored during session;RN gave pain meds during session    Home Living                      Prior Function            PT Goals (current goals can now be found in the care plan section) Progress towards PT goals: Progressing toward goals    Frequency           PT Plan Current plan remains appropriate    Co-evaluation             End of Session   Activity Tolerance: Patient tolerated treatment well Patient left: in bed;with call bell/phone within reach;with family/visitor present  Time: 1610-9604 PT Time Calculation (min) (ACUTE ONLY): 29 min  Charges:  $Gait Training: 8-22 mins $Therapeutic Exercise: 8-22 mins                    G Codes:      Meryem Haertel B Terez Montee 2017-01-28, 11:32 AM Delaney Meigs, PT [phone]

## 2017-01-13 LAB — GLUCOSE, CAPILLARY
GLUCOSE-CAPILLARY: 100 mg/dL — AB (ref 65–99)
Glucose-Capillary: 88 mg/dL (ref 65–99)
Glucose-Capillary: 83 mg/dL (ref 65–99)

## 2017-01-13 NOTE — Progress Notes (Signed)
Subjective: Interval History: none.. Explained this morning. His had several bowel movements. Feels comfortable and is smiling. Has walked the halls without difficulty. Feels hungry.  Objective: Vital signs in last 24 hours: Temp:  [98.7 F (37.1 C)-99 F (37.2 C)] 99 F (37.2 C) (02/13 0326) Pulse Rate:  [100-105] 100 (02/13 0326) Resp:  [18] 18 (02/13 0326) BP: (113-135)/(44-57) 127/51 (02/13 0326) SpO2:  [92 %-98 %] 97 % (02/13 0326) Weight:  [177 lb 4.8 oz (80.4 kg)] 177 lb 4.8 oz (80.4 kg) (02/13 0326)  Intake/Output from previous day: 02/12 0701 - 02/13 0700 In: 100 [P.O.:100] Out: 400 [Urine:400] Intake/Output this shift: No intake/output data recorded.  Abdomen soft and nontender. Groin incisions healing nicely. 2+ left posterior tibial pulse. The ulcer on the dorsal aspect of his foot is healing with good skin edge margin and some separation of the eschar  Lab Results:  Recent Labs  01/12/17 0935  WBC 10.0  HGB 8.9*  HCT 26.9*  PLT 364   BMET  Recent Labs  01/10/17 1118 01/12/17 0422 01/12/17 0935  NA 139  --  138  K 3.9  --  3.6  CL 108  --  106  CO2 24  --  24  GLUCOSE 106*  --  100*  BUN 22*  --  15  CREATININE 0.72 0.74 0.81  CALCIUM 8.4*  --  8.0*    Studies/Results: Dg Abd 1 View  Result Date: 01/12/2017 CLINICAL DATA:  Ileus, post BILATERAL femoral artery bypass grafting on 01/07/2017, smoker EXAM: ABDOMEN - 1 VIEW COMPARISON:  01/09/2017 FINDINGS: Persisting gaseous dilatation of small bowel loops throughout the abdomen. No definite bowel wall thickening or free air. Scattered small amounts of gas throughout colon including rectum with stool in distal descending colon. Atherosclerotic calcifications. Osseous demineralization. Lung bases clear. IMPRESSION: Persisting gaseous distention of small bowel loops with scattered gas throughout colon, favor postoperative ileus. Electronically Signed   By: Ulyses Southward M.D.   On: 01/12/2017 09:10   Dg Abd  1 View  Result Date: 01/09/2017 CLINICAL DATA:  Status post aortobifemoral bypass graft surgery with abdominal distention and nausea. EXAM: ABDOMEN - 1 VIEW COMPARISON:  01/07/2017. FINDINGS: The NG tube is not identified and has likely been removed. Diffuse ileus bowel gas pattern with air distended small bowel and colon. No definite free air. Stable dense vascular calcifications. IMPRESSION: Diffuse ileus bowel gas pattern. Electronically Signed   By: Rudie Meyer M.D.   On: 01/09/2017 12:16   Ct Angio Ao+bifem W & Or Wo Contrast  Result Date: 12/31/2016 CLINICAL DATA:  Bilateral lower extremity nonhealing wounds, claudication, peripheral vascular disease EXAM: CT ANGIOGRAPHY OF ABDOMINAL AORTA WITH ILIOFEMORAL RUNOFF TECHNIQUE: Multidetector CT imaging of the abdomen, pelvis and lower extremities was performed using the standard protocol during bolus administration of intravenous contrast. Multiplanar CT image reconstructions and MIPs were obtained to evaluate the vascular anatomy. CONTRAST:  150 cc Isovue 370 COMPARISON:  01/18/2014 FINDINGS: VASCULAR Aorta: Moderate wall thickening and calcific atherosclerosis of the aorta, most pronounced in the infrarenal segment. Negative for aneurysm or aortic occlusion. No dissection or retroperitoneal hemorrhage. Celiac: Patent origin. Splenic, left gastric, hepatic branches are also patent. SMA: Atherosclerotic origin with mild ostial narrowing, less than 50%. SMA trunk remains patent in the mesentery. Renals: Main renal arteries and accessory right renal artery to the upper pole all remain patent with mild atherosclerotic origins. IMA: Atherosclerotic origin but remains patent off of the distal aorta anteriorly. RIGHT Lower Extremity Inflow:  Heavily calcified right iliac system. Right common and external iliac arteries remain patent with mild luminal narrowings and small caliber but no occlusion. 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 Outflow: Right  common femoral artery is atherosclerotic but patent. Right profunda femoral artery remains patent. Chronic occlusion of the right SFA. Right above knee popliteal artery is reconstituted but very small in caliber with calcific atherosclerosis and luminal narrowings. Right popliteal artery remains patent across the knee. Runoff: Dominant runoff vessel to the right lower extremity is the peroneal artery which reconstitutes the dorsalis pedis artery peripherally. Anterior tibial and posterior tibial arteries are very small in caliber and diffusely diseased. LEFT Lower Extremity Inflow: Chronic occlusion of the left common, internal and external iliac arteries. Outflow: Left common femoral artery is reconstituted by inferior epigastric collateral pathways. Left profunda femoral artery remains patent. Left SFA demonstrates severe disease proximally with multifocal stenoses and a very small patent lumen throughout the thigh. However, left SFA appears to remain patent. Popliteal artery is small in caliber but patent across the left knee. Runoff: Below the knee, the tibioperoneal trunk is heavily disease. Dominant left lower extremity runoff is via the left posterior tibial artery into the foot. Left peroneal and anterior tibial arteries are diminutive in caliber and heavily disease. Veins: Venous imaging not performed. Review of the MIP images confirms the above findings. NON-VASCULAR Lower chest: Minor dependent bibasilar atelectasis. Normal heart size. No pericardial pleural effusion. Very large hiatal hernia. Hepatobiliary: No focal liver abnormality is seen. No gallstones, gallbladder wall thickening, or biliary dilatation. Pancreas: Unremarkable. No pancreatic ductal dilatation or surrounding inflammatory changes. Spleen: Spleen is normal in size. Calcified splenic granulomata evident. Adrenals/Urinary Tract: Adrenal glands are unremarkable. Kidneys are normal, without renal calculi, focal lesion, or hydronephrosis.  Bladder is unremarkable. Stomach/Bowel: Large hiatal hernia again noted. Negative bowel obstruction, significant dilatation, ileus, or free air. Normal appendix. Diffuse colonic diverticulosis, most severe in the sigmoid and rectum. No acute inflammatory process. No free fluid or abscess. Lymphatic: No adenopathy. Reproductive: No acute finding. Prostate normal in size. Seminal vesicles unremarkable. Other: Small fat containing left inguinal hernia. Small midline fat containing ventral hernia above the umbilicus. Small fat containing left umbilical hernia as well. Musculoskeletal: Minor diffuse thoracolumbar degenerative spondylosis. Facets are aligned. No compression fracture or acute osseous finding. IMPRESSION: VASCULAR Aortic atherosclerosis without aortic occlusion, dissection or aneurysm. 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 Right above knee popliteal artery is reconstituted via profunda femoral collaterals. Popliteal artery is diseased across the knee with preserved single vessel runoff via the right peroneal artery. Left common femoral artery is reconstituted through the inferior epigastric pathways. Left profunda femoral artery remains patent. Severely diseased diffuse left SFA disease without occlusion but with multifocal tandem stenoses. Left popliteal artery is diseased but patent across the knee. Dominant single vessel runoff to the left lower extremity is the left posterior tibial artery. NON-VASCULAR Large hiatal hernia Splenic granulomata Colonic diverticulosis Fat containing ventral hernia, umbilical hernia, and left inguinal hernia. Electronically Signed   By: Judie PetitM.  Shick M.D.   On: 12/31/2016 17:00   [place] Arterial Seg Single  Result Date: 12/30/2016 CLINICAL DATA:  Claudication right worse than left after 50 yards. Tobacco abuse. EXAM:  NONINVASIVE PHYSIOLOGIC VASCULAR STUDY OF BILATERAL LOWER EXTREMITIES TECHNIQUE: Evaluation of both lower extremities were performed at rest, including calculation of ankle-brachial indices with single level Doppler,  pressure and pulse volume recording. COMPARISON:  None. 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 Should the patient fail conservative treatment, consider CTA runoff (higher spatial resolution) or MRA runoff (no radiation risk, can be performed noncontrast in the setting of renal dysfunction) to better define the site and nature of arterial occlusive disease and delineate treatment options. Electronically Signed   By: Corlis Leak M.D.   On: 12/30/2016 14:21   Dg Chest Port 1 View  Result Date: 01/08/2017 CLINICAL DATA:  AAA repair. EXAM: PORTABLE CHEST 1 VIEW COMPARISON:  01/07/2017 FINDINGS: NG tube remains present in the stomach. There is bibasilar atelectasis. Heart is normal size. No effusions or pneumothorax. IMPRESSION: Minimal bibasilar atelectasis. Electronically Signed   By: Charlett Nose M.D.   On: 01/08/2017 08:24   Dg Chest Port 1 View  Result Date: 01/07/2017 CLINICAL DATA:  56 year old male status post AAA repair using bifurcated endograft. Initial encounter. EXAM: PORTABLE CHEST 1 VIEW COMPARISON:  CTA abdomen pelvis and runoff 12/31/2016. FINDINGS: Portable AP semi upright view at 1325 hours. Normal cardiac size and mediastinal contours. Visualized tracheal air column is within normal limits. Enteric tube in place and courses to the stomach. The side hole is at the level of the gastroesophageal junction. Mildly low lung volumes. Allowing for portable technique the lungs are clear. IMPRESSION: 1. Enteric tube tip is in the stomach. Advance 4 cm to ensure side hole placement  within the stomach. 2.  No acute cardiopulmonary abnormality. Electronically Signed   By: Odessa Fleming M.D.   On: 01/07/2017 13:44   Dg Abd Portable 1v  Result Date: 01/07/2017 CLINICAL DATA:  56 year old male status vascular surgery. Initial encounter. EXAM: PORTABLE ABDOMEN - 1 VIEW COMPARISON:  Portable chest from today. CTA abdomen pelvis and runoff 12/31/2016. FINDINGS: Portable AP supine view at 1330 hours. Stable enteric tube from the chest film reported separately. Non obstructed bowel gas pattern. No pneumoperitoneum identified on these supine views. Negative visualized abdominal visceral contours. Aortoiliac calcified atherosclerosis noted. There is no metallic endograft in place, open repair with largely radiolucent graft material suspected. No acute osseous abnormality identified. IMPRESSION: 1. Enteric tube in place as reported on comparison portable chest. 2. Normal bowel gas pattern. 3.  Aortoiliac calcified atherosclerosis noted. Electronically Signed   By: Odessa Fleming M.D.   On: 01/07/2017 13:46   Dg Foot Complete Left  Result Date: 12/29/2016 CLINICAL DATA:  Left foot wound, pain, swelling and redness. EXAM: LEFT FOOT - COMPLETE 3+ VIEW COMPARISON:  None available FINDINGS: Left first MTP joint demonstrates degenerative osteoarthritis with associated hallux valgus deformity, joint space loss, sclerosis and bony spurring. No acute osseous finding or fracture. Dorsal foot soft tissue swelling on the lateral view. No visualized radiopaque or metallic foreign body. No plain radiographic evidence of osseous destruction or bone loss. 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 Electronically Signed   By: Judie Petit.  Shick M.D.   On: 12/29/2016 20:05   Anti-infectives: Anti-infectives    Start     Dose/Rate Route Frequency Ordered Stop   01/07/17 0600  ceFAZolin (ANCEF) IVPB 1 g/50 mL  premix  Status:  Discontinued    Comments:  Send with pt to OR   1 g 100 mL/hr  over 30 Minutes Intravenous To Short Stay 01/06/17 0727 01/07/17 1432   01/03/17 1400  ceFAZolin (ANCEF) IVPB 2g/100 mL premix     2 g 200 mL/hr over 30 Minutes Intravenous Every 8 hours 01/03/17 1058     12/30/16 1600  vancomycin (VANCOCIN) 1,500 mg in sodium chloride 0.9 % 500 mL IVPB  Status:  Discontinued     1,500 mg 250 mL/hr over 120 Minutes Intravenous Every 12 hours 12/30/16 0834 12/31/16 1413   12/30/16 0800  vancomycin (VANCOCIN) IVPB 1000 mg/200 mL premix  Status:  Discontinued     1,000 mg 200 mL/hr over 60 Minutes Intravenous  Once 12/30/16 0134 12/30/16 0834   12/29/16 1930  vancomycin (VANCOCIN) IVPB 1000 mg/200 mL premix     1,000 mg 200 mL/hr over 60 Minutes Intravenous  Once 12/29/16 1927 12/29/16 2110      Assessment/Plan: s/p Procedure(s): AORTOBIFEMORAL BYPASS GRAFT (Bilateral) Ileus resolving. Will try liquids today in the morning and advance to more solid food at dinner if he tolerates this. Continue to mobilize   LOS: 15 days   Gretta Began 01/13/2017, 8:43 AM

## 2017-01-14 LAB — GLUCOSE, CAPILLARY
GLUCOSE-CAPILLARY: 109 mg/dL — AB (ref 65–99)
GLUCOSE-CAPILLARY: 108 mg/dL — AB (ref 65–99)
Glucose-Capillary: 111 mg/dL — ABNORMAL HIGH (ref 65–99)
Glucose-Capillary: 104 mg/dL — ABNORMAL HIGH (ref 65–99)

## 2017-01-14 MED ORDER — PANTOPRAZOLE SODIUM 40 MG PO TBEC
40.0000 mg | DELAYED_RELEASE_TABLET | Freq: Every day | ORAL | Status: DC
  Administered 2017-01-14 – 2017-01-15 (×2): 40 mg via ORAL
  Filled 2017-01-14 (×2): qty 1

## 2017-01-14 MED ORDER — OXYCODONE-ACETAMINOPHEN 5-325 MG PO TABS
1.0000 | ORAL_TABLET | ORAL | Status: DC | PRN
  Administered 2017-01-14 – 2017-01-15 (×4): 2 via ORAL
  Filled 2017-01-14 (×4): qty 2

## 2017-01-14 NOTE — Progress Notes (Signed)
Physical Therapy Treatment Patient Details Name: Ian Wong MRN: [id number] DOB: 06-02-1961 Today's Date: 01/14/2017    History of Present Illness 56 yo admitted with LLE cellulitis with bil LE claudication s/p aortobifem BPG. PMHx: tobacco use    PT Comments    Pt is up to walk with slow pace and controlled his use of walker, good breath control with no SOB.  He is expecting to get home soon, has a handicapped son that just got out of hospital and is worried about him.  Pt is appropriate to follow acutely but may be ready for DC next visit if no problems occur.  Will continue another visit to determine further need.  Follow Up Recommendations  No PT follow up     Equipment Recommendations  Rolling walker with 5" wheels    Recommendations for Other Services       Precautions / Restrictions Precautions Precautions: Fall Precaution Comments: monitor o2 Restrictions Weight Bearing Restrictions: No    Mobility  Bed Mobility           Sit to supine: Modified independent (Device/Increase time)   General bed mobility comments: up when PT arrived  Transfers Overall transfer level: Modified independent Equipment used: Rolling walker (2 wheeled) Transfers: Sit to/from [employer] Sit to Stand: Supervision Stand pivot transfers: Supervision       General transfer comment: aware of safety and needed no cues  Ambulation/Gait Ambulation/Gait assistance: Min guard (due to PT not having previously seen this pt) Ambulation Distance (Feet): 550 Feet Assistive device: Rolling walker (2 wheeled) Gait Pattern/deviations: Step-through pattern;Decreased stride length;Wide base of support;Trunk flexed (pt working on standing upright) Gait velocity: reduced Gait velocity interpretation: Below normal speed for age/gender General Gait Details: cues for posture, position in RW and safety. Pt without pain with increased distance and no SOB today   Stairs            Wheelchair Mobility    Modified Rankin (Stroke Patients Only)       Balance     Sitting balance-Leahy Scale: Good     Standing balance support: Bilateral upper extremity supported Standing balance-Leahy Scale: Fair                      Cognition Arousal/Alertness: Awake/alert Behavior During Therapy: WFL for tasks assessed/performed Overall Cognitive Status: Within Functional Limits for tasks assessed                      Exercises      General Comments        Pertinent Vitals/Pain Pain Assessment: No/denies pain    Home Living                      Prior Function            PT Goals (current goals can now be found in the care plan section) Progress towards PT goals: Progressing toward goals    Frequency    Min 3X/week      PT Plan Current plan remains appropriate    Co-evaluation             End of Session Equipment Utilized During Treatment: Gait belt Activity Tolerance: Patient tolerated treatment well Patient left: in bed;with call bell/phone within reach     Time: 1203-1232 PT Time Calculation (min) (ACUTE ONLY): 29 min  Charges:  $Gait Training: 23-37 mins  G Codes:      Ivar DrapeRuth E Kamerin Grumbine 01/14/2017, 1:12 PM   Samul Dadauth Ami Mally, PT MS Acute Rehab Dept. Number: [id number] and [facility] [phone]

## 2017-01-14 NOTE — Progress Notes (Signed)
  Progress Note    01/14/2017 7:45 AM 7 Days Post-Op  Subjective:  Tolerated clear liquids yesterday;  Had a couple of bowel movements this morning.  Denies any nausea or vomiting.  Has been walking in the halls.  Afebrile HR 90's-110's NSR 120's systolic 100% RA  Vitals:   01/14/17 0047 01/14/17 0450  BP: (!) 127/47 (!) 126/42  Pulse: 93 (!) 113  Resp:  18  Temp:  98.3 F (36.8 C)    Physical Exam: Cardiac:  regular Lungs:  Non labored Incisions:  Laparotomy and bilateral groins are healing nicely Extremities:  Bilateral feet are warm; superficial sore between right 4th & 5th toes; eschar on dorsum of left foot and small area lateral lower leg.   Abdomen:  Soft, NT/ND; +BS; +BM  CBC    Component Value Date/Time   WBC 10.0 01/12/2017 0935   RBC 2.93 (L) 01/12/2017 0935   HGB 8.9 (L) 01/12/2017 0935   HCT 26.9 (L) 01/12/2017 0935   PLT 364 01/12/2017 0935   MCV 91.8 01/12/2017 0935   MCH 30.4 01/12/2017 0935   MCHC 33.1 01/12/2017 0935   RDW 13.1 01/12/2017 0935   LYMPHSABS 1.9 12/29/2016 1543   MONOABS 1.5 (H) 12/29/2016 1543   EOSABS 0.1 12/29/2016 1543   BASOSABS 0.0 12/29/2016 1543    BMET    Component Value Date/Time   NA 138 01/12/2017 0935   K 3.6 01/12/2017 0935   CL 106 01/12/2017 0935   CO2 24 01/12/2017 0935   GLUCOSE 100 (H) 01/12/2017 0935   BUN 15 01/12/2017 0935   CREATININE 0.81 01/12/2017 0935   CALCIUM 8.0 (L) 01/12/2017 0935   GFRNONAA >60 01/12/2017 0935   GFRAA >60 01/12/2017 0935    INR    Component Value Date/Time   INR 1.11 01/08/2017 0340     Intake/Output Summary (Last 24 hours) at 01/14/17 0745 Last data filed at 01/14/17 0400  Gross per 24 hour  Intake              240 ml  Output              401 ml  Net             -161 ml     Assessment:  56 y.o. male is s/p:  Aortobifemoral bypass grafting  7 Days Post-Op  Plan: -pt feeling much better; tolerated clear liquid diet-will advance to regular diet this  morning. -DVT prophylaxis:  SQ heparin -change Protonix to po -dc IV metoprolol -d/c IVF   [name], PA-C [facility] [phone] 01/14/2017 7:45 AM   \

## 2017-01-15 LAB — GLUCOSE, CAPILLARY
GLUCOSE-CAPILLARY: 100 mg/dL — AB (ref 65–99)
Glucose-Capillary: 100 mg/dL — ABNORMAL HIGH (ref 65–99)

## 2017-01-15 MED ORDER — OXYCODONE-ACETAMINOPHEN 5-325 MG PO TABS: 1.0000 | ORAL_TABLET | Freq: Four times a day (QID) | ORAL | 0 refills | Status: DC | PRN

## 2017-01-15 MED ORDER — ATORVASTATIN CALCIUM 10 MG PO TABS: 10.0000 mg | ORAL_TABLET | Freq: Every day | ORAL | 1 refills | Status: DC

## 2017-01-15 NOTE — Discharge Summary (Signed)
[facility] Discharge Summary    Ian Wong 06/11/1961 56 y.o. male  [id number]  Admission Date: 01/02/17  Discharge Date: 01/15/17  Physician: Larina Earthly, MD  Admission Diagnosis: Cellulitis of left lower extremity [L03.116]   HPI:   This is a 56 y.o. male who is transferred from [facility] for further evaluation and treatment of critical limb ischemia of his left leg. He is a very pleasant 56 year old gentleman who reports at least a three-year history of severe progressive claudication symptoms. This is a in his right and left leg. He has recently developed rest pain with the need for dependency for relief of pain in his left foot at night. Over the past several months he has had a progressive ulceration of his left lateral calf and left dorsum of his foot. He has had several episodes of surrounding erythema. He has had no similar tissue loss on his left leg. He does report that in the past his discomfort this Was relieved by rest but this is making it harder for him to do his usual activities. He denies any prior history of cardiac disease and no prior history of stroke. He does smoke cigarettes but has committed to quitting with this event understanding this is related to cigarette smoking. He does have a history of erectile dysfunction. No history of abdominal ischemia. Remote history of hernia repair but no other abdominal surgery.  Hospital Course:  The patient was admitted to the hospital in need of an aortobifemoral bypass grafting. Cardiology was consulted for pre operative clearance.   1. Mr. Shipper denies chest pain or worsening dyspnea. Despite his claudication, he is active and regularly performs >4 METS of activity. There is no clear indication for preoperative testing at this time.  He had dyslipidemia with LDL of 102 - would recommend a high potency statin with goal LDL <70. Smoking cessation is recommended. BP has been labile - if there is bp room, consider adding  low dose b-blocker such as metoprolol tartrate 12.5 mg BID for perioperative benefit   He did have a 2D echocardiogram on 01/04/17 with the following results: Study Conclusions  - Procedure narrative: Transthoracic echocardiography. Image   quality was poor. Intravenous contrast (Definity) was   administered. - Left ventricle: Systolic function was normal. The estimated   ejection fraction was in the range of 60% to 65%. Doppler   parameters are consistent with abnormal left ventricular   relaxation (grade 1 diastolic dysfunction). The E/e&' ratio is <8,   suggesting normal LV filling pressure. - Left atrium: The atrium was normal in size. - Inferior vena cava: The vessel was normal in size. The   respirophasic diameter changes were in the normal range (>= 50%),   consistent with normal central venous pressure.  Impressions:  - Technically difficult study. Definity contrast given. Grossly   normal LVEF of 60-65%, diastolic dysfunction and normal LV   filling pressure, normal biatrial size, normal IVC.  On 01/05/17, Dr. Arbie Cookey discussed with patient. Fortunately he has a wife and multiple other family members present today. Explained all the planned aortobifemoral bypass reimplantation of inferior mesenteric artery. Explained the magnitude of the procedure and expected recovery. Explained that he will require quite some time to heal the dorsum of his foot despite improved inflow due to the amount of tissue loss present. Patient is committed to smoking cessation and again congratulated him on this. He was taken to the operating room on 01/07/2017 and underwent: Aortobifemoral bypass grafting.  The pt tolerated the procedure well and was transported to the PACU in good condition. There he had brisk doppler signals in the bilateral DP/PT.  He was scheduled for IV lopressor q6hr.  Strict npo and transferred to 2 south (ICU).  By POD 1, he continued to have good doppler flow to both feet.  He  did have mild hyponatremia and hyperglycemia and his IVF was changed to NaCl.  His NGT and a-line were removed.  He was transferred to the telemetry floor.  By POD 2, his hyponatremia and hyperglycemia were improved.  He did well with PT on mobilization.  He did have some nausea and some vomiting that morning.  He was passing flatus and +BS and BM.  Given his N/V, we held off on the ice chips.  His abdomen remained soft and non tender.    On POD 3, he did have some abdominal distention.    On POD 4, his abdomen was less distended, he was passing flatus and had ice chips without discomfort.  He was slowly started on clear liquids and instructed to stop if he developed N/V again.   On POD 5, he did have some N/V again.  He had not had a BM and was given a dulcolax suppository.  He did have some coarse BS bilaterally and was encouraged to continue working on his IS.  A KUB was ordered and it continued to show an ileus pattern.    On POD 6, his ileus was resolving.  He was started on clear liquids.   On POD 7, he was feeling much better and was advanced to a regular diet.  On POD 8, he was doing well and ws discharged home.    The remainder of the hospital course consisted of increasing mobilization and increasing intake of solids without difficulty.  CBC    Component Value Date/Time   WBC 10.0 01/12/2017 0935   RBC 2.93 (L) 01/12/2017 0935   HGB 8.9 (L) 01/12/2017 0935   HCT 26.9 (L) 01/12/2017 0935   PLT 364 01/12/2017 0935   MCV 91.8 01/12/2017 0935   MCH 30.4 01/12/2017 0935   MCHC 33.1 01/12/2017 0935   RDW 13.1 01/12/2017 0935   LYMPHSABS 1.9 12/29/2016 1543   MONOABS 1.5 (H) 12/29/2016 1543   EOSABS 0.1 12/29/2016 1543   BASOSABS 0.0 12/29/2016 1543    BMET    Component Value Date/Time   NA 138 01/12/2017 0935   K 3.6 01/12/2017 0935   CL 106 01/12/2017 0935   CO2 24 01/12/2017 0935   GLUCOSE 100 (H) 01/12/2017 0935   BUN 15 01/12/2017 0935   CREATININE 0.81 01/12/2017  0935   CALCIUM 8.0 (L) 01/12/2017 0935   GFRNONAA >60 01/12/2017 0935   GFRAA >60 01/12/2017 0935     Discharge Instructions    ABDOMINAL PROCEDURE/ANEURYSM REPAIR/AORTO-BIFEMORAL BYPASS:  Call MD for increased abdominal pain; cramping diarrhea; nausea/vomiting    Complete by:  As directed    Call MD for:  redness, tenderness, or signs of infection (pain, swelling, bleeding, redness, odor or green/yellow discharge around incision site)    Complete by:  As directed    Call MD for:  severe or increased pain, loss or decreased feeling  in affected limb(s)    Complete by:  As directed    Call MD for:  temperature >100.5    Complete by:  As directed    Discharge wound care:    Complete by:  As directed  Wash the groin wound with soap and water daily and pat dry. (No tub bath-only shower)  Then put a dry gauze or washcloth there to keep this area dry daily and as needed.  Do not use Vaseline or neosporin on your incisions.  Only use soap and water on your incisions and then protect and keep dry.   Driving Restrictions    Complete by:  As directed    Do not drive for 2 weeks and while taking pain medication.   Lifting restrictions    Complete by:  As directed    No lifting for 4 weeks   Resume previous diet    Complete by:  As directed       Discharge Diagnosis:  Cellulitis of left lower extremity [L03.116]  Secondary Diagnosis: Patient Active Problem List   Diagnosis Date Noted  . 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 Leriche syndrome (HCC)   . PAD (peripheral artery disease) (HCC) 01/03/2017  . Critical lower limb ischemia 01/03/2017  . Cellulitis 12/29/2016  . Cellulitis of left lower extremity    History reviewed. No pertinent past medical history.   Allergies as of 01/15/2017   No Known Allergies     Medication List    TAKE these medications   ALEVE 220 MG tablet Generic drug:  naproxen sodium Take 220-440 mg by mouth daily as needed (for pain).     aspirin EC 81 MG tablet Take 81 mg by mouth daily.   atorvastatin 10 MG tablet Commonly known as:  LIPITOR Take 1 tablet (10 mg total) by mouth daily.   levothyroxine 50 MCG tablet Commonly known as:  SYNTHROID, LEVOTHROID Take 50 mcg by mouth daily.   oxyCODONE-acetaminophen 5-325 MG tablet Commonly known as:  PERCOCET/ROXICET Take 1 tablet by mouth every 6 (six) hours as needed for moderate pain or severe pain.       Prescriptions given: 1.  Roxicet #30 No Refill 2.  Lipitor 10mg  daily #30 1 refill (refills per PCP0  Instructions: 1.  No driving x 2 weeks and while taking pain medications 2.  No heavy lifting x 4 weeks. 3.  If you have groin incisions, Wash the groin wound with soap and water daily and pat dry. (No tub bath-only shower)  Then put a dry gauze or washcloth there to keep this area dry daily and as needed.  Do not use Vaseline or neosporin on your incisions.  Only use soap and water on your incisions and then protect and keep dry. 4.  Shower daily starting 01/16/17  Disposition: home  Patient's condition: is Good  Follow up: 1. Dr. Arbie CookeyEarly in 3 weeks   Doreatha MassedSamantha Kaiana Marion, PA-C [facility] [phone] 01/15/2017  1:05 PM   - For VQI Registry use -  Post-op:  Time to Extubation: [x]  In OR, [ ]  < 12 hrs, [ ]  12-24 hrs, [ ]  >=24 hrs Vasopressors Req. Post-op: No ICU Stay: 1 day in ICU Transfusion: No   If yes, n/a units given MI: No, [ ]  Troponin only, [ ]  EKG or Clinical New Arrhythmia: No  Complications: CHF: No Resp failure: No, [ ]  Pneumonia, [ ]  Ventilator Chg in renal function: No, [ ]  Inc. Cr > 0.5, [ ]  Temp. Dialysis, [ ]  Permanent dialysis Leg ischemia: No, no Surgery needed, [ ]  Yes, Surgery needed, [ ]  Amputation Bowel ischemia: No, [ ]  Medical Rx, [ ]  Surgical Rx Wound complication: No, [ ]  Superficial separation/infection, [ ]  Return to  OR Return to OR: No  Return to OR for bleeding: No Stroke: No, [ ]  Minor, [ ]   Major  Discharge medications: Statin use:  Yes If No:   ASA use:  Yes If No:   Plavix use:  No  Beta blocker use:  No  ACEI use:  No ARB use:  No CCB use:  No Coumadin use:  No

## 2017-01-15 NOTE — Progress Notes (Signed)
Subjective: Interval History: none.. Looks great today. No nausea. Having bowel movements and tolerating regular diet  Objective: Vital signs in last 24 hours: Temp:  [98 F (36.7 C)-98.6 F (37 C)] 98.6 F (37 C) (02/15 0441) Pulse Rate:  [74-99] 99 (02/15 0441) Resp:  [18] 18 (02/15 0441) BP: (116-127)/(52-64) 116/64 (02/15 0441) SpO2:  [98 %] 98 % (02/15 0441) Weight:  [180 lb 9.6 oz (81.9 kg)] 180 lb 9.6 oz (81.9 kg) (02/15 0441)  Intake/Output from previous day: 02/14 0701 - 02/15 0700 In: 720 [P.O.:720] Out: -  Intake/Output this shift: No intake/output data recorded.  Physical exam is unchanged feet well-perfused bilaterally  Lab Results:  Recent Labs  01/12/17 0935  WBC 10.0  HGB 8.9*  HCT 26.9*  PLT 364   BMET  Recent Labs  01/12/17 0935  NA 138  K 3.6  CL 106  CO2 24  GLUCOSE 100*  BUN 15  CREATININE 0.81  CALCIUM 8.0*    Studies/Results: Dg Abd 1 View  Result Date: 01/12/2017 CLINICAL DATA:  Ileus, post BILATERAL femoral artery bypass grafting on 01/07/2017, smoker EXAM: ABDOMEN - 1 VIEW COMPARISON:  01/09/2017 FINDINGS: Persisting gaseous dilatation of small bowel loops throughout the abdomen. No definite bowel wall thickening or free air. Scattered small amounts of gas throughout colon including rectum with stool in distal descending colon. Atherosclerotic calcifications. Osseous demineralization. Lung bases clear. IMPRESSION: Persisting gaseous distention of small bowel loops with scattered gas throughout colon, favor postoperative ileus. Electronically Signed   By: Ulyses Southward M.D.   On: 01/12/2017 09:10   Dg Abd 1 View  Result Date: 01/09/2017 CLINICAL DATA:  Status post aortobifemoral bypass graft surgery with abdominal distention and nausea. EXAM: ABDOMEN - 1 VIEW COMPARISON:  01/07/2017. FINDINGS: The NG tube is not identified and has likely been removed. Diffuse ileus bowel gas pattern with air distended small bowel and colon. No definite  free air. Stable dense vascular calcifications. IMPRESSION: Diffuse ileus bowel gas pattern. Electronically Signed   By: Rudie Meyer M.D.   On: 01/09/2017 12:16   Ct Angio Ao+bifem W & Or Wo Contrast  Result Date: 12/31/2016 CLINICAL DATA:  Bilateral lower extremity nonhealing wounds, claudication, peripheral vascular disease EXAM: CT ANGIOGRAPHY OF ABDOMINAL AORTA WITH ILIOFEMORAL RUNOFF TECHNIQUE: Multidetector CT imaging of the abdomen, pelvis and lower extremities was performed using the standard protocol during bolus administration of intravenous contrast. Multiplanar CT image reconstructions and MIPs were obtained to evaluate the vascular anatomy. CONTRAST:  150 cc Isovue 370 COMPARISON:  01/18/2014 FINDINGS: VASCULAR Aorta: Moderate wall thickening and calcific atherosclerosis of the aorta, most pronounced in the infrarenal segment. Negative for aneurysm or aortic occlusion. No dissection or retroperitoneal hemorrhage. Celiac: Patent origin. Splenic, left gastric, hepatic branches are also patent. SMA: Atherosclerotic origin with mild ostial narrowing, less than 50%. SMA trunk remains patent in the mesentery. Renals: Main renal arteries and accessory right renal artery to the upper pole all remain patent with mild atherosclerotic origins. IMA: Atherosclerotic origin but remains patent off of the distal aorta anteriorly. RIGHT Lower Extremity Inflow: Heavily calcified right iliac system. Right common and external iliac arteries remain patent with mild luminal narrowings and small caliber but no occlusion. 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 Outflow: Right common femoral artery is atherosclerotic but patent. Right profunda femoral artery remains patent. Chronic occlusion of the right SFA. Right above knee popliteal artery is reconstituted but very small in caliber with calcific atherosclerosis and luminal narrowings. Right popliteal artery  remains patent across the knee. Runoff: Dominant  runoff vessel to the right lower extremity is the peroneal artery which reconstitutes the dorsalis pedis artery peripherally. Anterior tibial and posterior tibial arteries are very small in caliber and diffusely diseased. LEFT Lower Extremity Inflow: Chronic occlusion of the left common, internal and external iliac arteries. Outflow: Left common femoral artery is reconstituted by inferior epigastric collateral pathways. Left profunda femoral artery remains patent. Left SFA demonstrates severe disease proximally with multifocal stenoses and a very small patent lumen throughout the thigh. However, left SFA appears to remain patent. Popliteal artery is small in caliber but patent across the left knee. Runoff: Below the knee, the tibioperoneal trunk is heavily disease. Dominant left lower extremity runoff is via the left posterior tibial artery into the foot. Left peroneal and anterior tibial arteries are diminutive in caliber and heavily disease. Veins: Venous imaging not performed. Review of the MIP images confirms the above findings. NON-VASCULAR Lower chest: Minor dependent bibasilar atelectasis. Normal heart size. No pericardial pleural effusion. Very large hiatal hernia. Hepatobiliary: No focal liver abnormality is seen. No gallstones, gallbladder wall thickening, or biliary dilatation. Pancreas: Unremarkable. No pancreatic ductal dilatation or surrounding inflammatory changes. Spleen: Spleen is normal in size. Calcified splenic granulomata evident. Adrenals/Urinary Tract: Adrenal glands are unremarkable. Kidneys are normal, without renal calculi, focal lesion, or hydronephrosis. Bladder is unremarkable. Stomach/Bowel: Large hiatal hernia again noted. Negative bowel obstruction, significant dilatation, ileus, or free air. Normal appendix. Diffuse colonic diverticulosis, most severe in the sigmoid and rectum. No acute inflammatory process. No free fluid or abscess. Lymphatic: No adenopathy. Reproductive: No acute  finding. Prostate normal in size. Seminal vesicles unremarkable. Other: Small fat containing left inguinal hernia. Small midline fat containing ventral hernia above the umbilicus. Small fat containing left umbilical hernia as well. Musculoskeletal: Minor diffuse thoracolumbar degenerative spondylosis. Facets are aligned. No compression fracture or acute osseous finding. IMPRESSION: VASCULAR Aortic atherosclerosis without aortic occlusion, dissection or aneurysm. 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 Right above knee popliteal artery is reconstituted via profunda femoral collaterals. Popliteal artery is diseased across the knee with preserved single vessel runoff via the right peroneal artery. Left common femoral artery is reconstituted through the inferior epigastric pathways. Left profunda femoral artery remains patent. Severely diseased diffuse left SFA disease without occlusion but with multifocal tandem stenoses. Left popliteal artery is diseased but patent across the knee. Dominant single vessel runoff to the left lower extremity is the left posterior tibial artery. NON-VASCULAR Large hiatal hernia Splenic granulomata Colonic diverticulosis Fat containing ventral hernia, umbilical hernia, and left inguinal hernia. Electronically Signed   By: Judie Petit.  Shick M.D.   On: 12/31/2016 17:00   US Arterial Seg Single  Result Date: 12/30/2016 CLINICAL DATA:  Claudication right worse than left after 50 yards. Tobacco abuse. EXAM: NONINVASIVE PHYSIOLOGIC VASCULAR STUDY OF BILATERAL LOWER EXTREMITIES TECHNIQUE: Evaluation of both lower extremities were performed at rest, including calculation of ankle-brachial indices with single level Doppler, pressure and pulse volume recording. COMPARISON:  None. 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 Should the patient fail conservative treatment, consider CTA runoff (higher spatial resolution) or MRA runoff (no radiation risk, can be performed noncontrast in the setting of renal  dysfunction) to better define the site and nature of arterial occlusive disease and delineate treatment options. Electronically Signed   By: Corlis Leak M.D.   On: 12/30/2016 14:21   Dg Chest Port 1 View  Result Date: 01/08/2017 CLINICAL DATA:  AAA repair. EXAM: PORTABLE CHEST 1 VIEW COMPARISON:  01/07/2017 FINDINGS: NG tube remains present in the stomach. There is bibasilar atelectasis. Heart is normal size. No effusions or pneumothorax. IMPRESSION: Minimal bibasilar atelectasis. Electronically Signed   By: Charlett Nose M.D.   On: 01/08/2017 08:24   Dg Chest Port 1 View  Result Date: 01/07/2017 CLINICAL DATA:  56 year old male status post AAA repair using bifurcated endograft. Initial encounter. EXAM: PORTABLE CHEST 1 VIEW COMPARISON:  CTA abdomen pelvis and runoff 12/31/2016. FINDINGS: Portable AP semi upright view at 1325 hours. Normal cardiac size and mediastinal contours. Visualized tracheal air column is within normal limits. Enteric tube in place and courses to the stomach. The side hole is at the level of the gastroesophageal junction. Mildly low lung volumes. Allowing for portable technique the lungs are clear. IMPRESSION: 1. Enteric tube tip is in the stomach. Advance 4 cm to ensure side hole placement within the stomach. 2.  No acute cardiopulmonary abnormality. Electronically Signed   By: Odessa Fleming M.D.   On: 01/07/2017 13:44   Dg Abd Portable 1v  Result Date: 01/07/2017 CLINICAL DATA:  56 year old male status vascular surgery. Initial encounter. EXAM: PORTABLE ABDOMEN - 1 VIEW COMPARISON:  Portable chest from today. CTA abdomen  pelvis and runoff 12/31/2016. FINDINGS: Portable AP supine view at 1330 hours. Stable enteric tube from the chest film reported separately. Non obstructed bowel gas pattern. No pneumoperitoneum identified on these supine views. Negative visualized abdominal visceral contours. Aortoiliac calcified atherosclerosis noted. There is no metallic endograft in place, open repair with largely radiolucent graft material suspected. No acute osseous abnormality identified. IMPRESSION: 1. Enteric tube in place as reported on comparison portable chest. 2. Normal bowel gas pattern. 3.  Aortoiliac calcified atherosclerosis noted. Electronically Signed   By: Odessa Fleming M.D.   On: 01/07/2017 13:46   Dg Foot Complete Left  Result Date: 12/29/2016 CLINICAL DATA:  Left foot wound, pain, swelling and redness. EXAM: LEFT FOOT - COMPLETE 3+ VIEW COMPARISON:  None available FINDINGS: Left first MTP joint demonstrates degenerative osteoarthritis with associated hallux valgus deformity, joint space loss, sclerosis and bony spurring. No acute osseous finding or fracture. Dorsal foot soft tissue swelling on the lateral view. No visualized radiopaque or metallic foreign body. No plain radiographic evidence of osseous destruction or bone loss. 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 Electronically Signed   By: Judie Petit.  Shick M.D.   On: 12/29/2016 20:05   Anti-infectives: Anti-infectives    Start     Dose/Rate Route Frequency Ordered Stop   01/07/17 0600  ceFAZolin (ANCEF) IVPB 1 g/50 mL premix  Status:  Discontinued    Comments:  Send with pt to OR   1 g 100 mL/hr over 30 Minutes Intravenous To Short Stay 01/06/17 0727 01/07/17 1432   01/03/17 1400  ceFAZolin (ANCEF) IVPB 2g/100 mL premix     2 g 200 mL/hr over 30 Minutes Intravenous Every 8 hours 01/03/17 1058     12/30/16 1600  vancomycin (VANCOCIN) 1,500 mg in  sodium chloride 0.9 % 500 mL IVPB  Status:  Discontinued     1,500 mg 250 mL/hr over 120  Minutes Intravenous Every 12 hours 12/30/16 0834 12/31/16 1413   12/30/16 0800  vancomycin (VANCOCIN) IVPB 1000 mg/200 mL premix  Status:  Discontinued     1,000 mg 200 mL/hr over 60 Minutes Intravenous  Once 12/30/16 0134 12/30/16 0834   12/29/16 1930  vancomycin (VANCOCIN) IVPB 1000 mg/200 mL premix     1,000 mg 200 mL/hr over 60 Minutes Intravenous  Once 12/29/16 1927 12/29/16 2110      Assessment/Plan: s/p Procedure(s): AORTOBIFEMORAL BYPASS GRAFT (Bilateral) Stable for discharge today. Follow-up in several weeks   LOS: 17 days   EarlyTawanna Cooler, Wendel Homeyer 01/15/2017, 8:35 AM

## 2017-01-16 ENCOUNTER — Telehealth: Payer: Self-pay | Admitting: Vascular Surgery

## 2017-01-16 NOTE — Care Management Note (Signed)
Case Management Note Previous CM note initiated by Lawerance Sabalebbie Swist, RN 01/06/2017, 1:42 PM   Patient Details  Name: Ian Wong MRN: [id number] Date of Birth: 09/19/1961  Subjective/Objective:                 Patient being followed by vascular surgery, plan for surgery Wednesday, Continues IV Abx for cellulitis. From home with wife.  [facility] [place], [place]   Action/Plan:   Expected Discharge Date:  01/15/17               Expected Discharge Plan:  Home/Self Care  In-House Referral:     Discharge planning Services  CM Consult  Post Acute Care Choice:    Choice offered to:     DME Arranged:    DME Agency:     HH Arranged:    HH Agency:     Status of Service:  Completed, signed off  If discussed at Long Length of Stay Meetings, dates discussed:  2/13, 2/15  Discharge Disposition: home/self care   Additional Comments:  01/15/17- Donn PieriniKristi Tahjay Binion RN, CM- pt s/p AORTOBIFEMORAL BYPASS GRAFT - post op ileus resolved- pt stable for d/c home today- no CM needs noted.    Darrold SpanWebster, Nana [place]elton Hall, RN 01/16/2017, 3:26 PM

## 2017-01-16 NOTE — Telephone Encounter (Signed)
Spoke to spouse for appt 3/13, mailed letter

## 2017-01-16 NOTE — Telephone Encounter (Signed)
-----   Message from Sharee PimpleMarilyn K McChesney, RN sent at 01/15/2017  1:20 PM EST ----- Regarding: 3 weeks    ----- Message ----- From: Dara LordsSamantha J Rhyne, PA-C Sent: 01/15/2017   1:05 PM To: Vvs Charge Pool  S/p aortobifemoral bypass  F/u in 3 weeks with Dr. Arbie CookeyEarly.

## 2017-01-22 ENCOUNTER — Telehealth: Payer: Self-pay

## 2017-01-22 DIAGNOSIS — G8918 Other acute postprocedural pain: Secondary | ICD-10-CM

## 2017-01-22 MED ORDER — OXYCODONE-ACETAMINOPHEN 5-325 MG PO TABS: 1.0000 | ORAL_TABLET | Freq: Four times a day (QID) | ORAL | 0 refills | Status: DC | PRN

## 2017-01-22 NOTE — Telephone Encounter (Signed)
Phone call from pt.  Requested he needs a refill on Oxycodone/ Acetaminophen.  Reported he has tried to take Ibuprofen and Aleve, but they aren't effective.  Reported incisional pain level at 5-6/10; last dose of Oxycodone/ Aceta. taken approx. 1 hr. prior to this call; has been taking 1 tab. q 6 hrs., as instructed on the bottle.  Denied fever/ chills; denied drainage or separation of incision.  Stated the incisional areas look good; reported "slight redness in the lower incisional area near the navel."   Advised will discuss with Dr. Arbie CookeyEarly, and return call.

## 2017-01-22 NOTE — Telephone Encounter (Signed)
Discussed pt's. Request with Dr. Arbie CookeyEarly.  Recommended to refill Roxicet # 20; no refills at this time.  Pt. notified of prescription authorized; instructed to pick up at office.

## 2017-01-30 ENCOUNTER — Encounter: Payer: Self-pay | Admitting: Vascular Surgery

## 2017-02-10 ENCOUNTER — Encounter: Payer: Self-pay | Admitting: Vascular Surgery

## 2017-02-20 ENCOUNTER — Encounter: Payer: Self-pay | Admitting: Vascular Surgery

## 2017-03-03 ENCOUNTER — Ambulatory Visit (INDEPENDENT_AMBULATORY_CARE_PROVIDER_SITE_OTHER): Payer: Self-pay | Admitting: Vascular Surgery

## 2017-03-03 ENCOUNTER — Encounter: Payer: Self-pay | Admitting: Vascular Surgery

## 2017-03-03 VITALS — BP 135/74 | HR 81 | Temp 98.4°F | Resp 16 | Ht 71.0 in | Wt 169.0 lb

## 2017-03-03 DIAGNOSIS — I7409 Other arterial embolism and thrombosis of abdominal aorta: Secondary | ICD-10-CM

## 2017-03-03 NOTE — Progress Notes (Signed)
   Patient name: Ian Wong MRN: [id number] DOB: February 13, 1961 Sex: male  REASON FOR VISIT: Follow-up of aortofemoral bypass  HPI: Ian Wong is a 56 y.o. male here today for follow-up. He presented with critical limb ischemia and a large eschar over the dorsum of his foot with surrounding erythema and infection. He underwent CT angiogram at [facility] which revealed iliac occlusive disease and also bilateral superficial femoral artery occlusive disease. He underwent aortobifemoral bypass at [facility]. He did have a postop ileus but otherwise uneventful recovery and was discharged home. He continues to do well. He reports he's lost proximate 20 pounds since the surgery and is regaining his appetite. He does report occasional nausea but no vomiting. He has resolved abdominal discomfort  Current Outpatient Prescriptions  Medication Sig Dispense Refill  . aspirin EC 81 MG tablet Take 81 mg by mouth daily.    [name] atorvastatin (LIPITOR) 10 MG tablet Take 1 tablet (10 mg total) by mouth daily. 30 tablet 1  . levothyroxine (SYNTHROID, LEVOTHROID) 50 MCG tablet Take 50 mcg by mouth daily.  0  . naproxen sodium (ALEVE) 220 MG tablet Take 220-440 mg by mouth daily as needed (for pain).    [name] oxyCODONE-acetaminophen (PERCOCET/ROXICET) 5-325 MG tablet Take 1 tablet by mouth every 6 (six) hours as needed for moderate pain or severe pain. 20 tablet 0   No current facility-administered medications for this visit.      PHYSICAL EXAM: Vitals:   03/03/17 1431  BP: 135/74  Pulse: 81  Resp: 16  Temp: 98.4 F (36.9 C)  TempSrc: Oral  SpO2: 96%  Weight: 169 lb (76.7 kg)  Height:  (1.803 m)    GENERAL: The patient is a well-nourished male, in no acute distress. The vital signs are documented above. Abdominal incision is well-healed with no hernia. He has 2+ femoral pulses with no false aneurysm. He does have a near complete healing of the lesion  over the dorsum of his foot. The remaining areas very superficial with fragile skin over this.  MEDICAL ISSUES: Stable status post aortobifemoral bypass for limb threatening ischemia. We'll continue to increase his activities. He is released to full work without limitation and will see [place] again in 6 months with ankle arm indices   Larina Earthly, MD [facility] Tel [phone] Pager [phone]

## 2017-03-04 NOTE — Addendum Note (Signed)
Addended by: Burton Apley A on: 03/04/2017 09:38 AM   Modules accepted: Orders

## 2017-03-12 ENCOUNTER — Other Ambulatory Visit: Payer: Self-pay | Admitting: Physician Assistant

## 2017-05-01 NOTE — Addendum Note (Signed)
Addendum  created 05/01/17 1048 by Persais Ethridge, MD   Sign clinical note    

## 2017-05-09 ENCOUNTER — Other Ambulatory Visit: Payer: Self-pay | Admitting: Vascular Surgery

## 2017-09-08 ENCOUNTER — Encounter: Payer: Self-pay | Admitting: Vascular Surgery

## 2017-09-08 ENCOUNTER — Ambulatory Visit (INDEPENDENT_AMBULATORY_CARE_PROVIDER_SITE_OTHER): Payer: Medicaid Other | Admitting: Vascular Surgery

## 2017-09-08 ENCOUNTER — Ambulatory Visit (HOSPITAL_COMMUNITY)
Admission: RE | Admit: 2017-09-08 | Discharge: 2017-09-08 | Disposition: A | Discharge status: Home / Self Care | Payer: Medicaid Other | Source: Ambulatory Visit | Attending: Vascular Surgery | Admitting: Vascular Surgery

## 2017-09-08 VITALS — BP 140/77 | HR 70 | Temp 98.5°F | Resp 18 | Ht 71.0 in | Wt 191.0 lb

## 2017-09-08 DIAGNOSIS — I7409 Other arterial embolism and thrombosis of abdominal aorta: Secondary | ICD-10-CM | POA: Insufficient documentation

## 2017-09-08 DIAGNOSIS — I739 Peripheral vascular disease, unspecified: Secondary | ICD-10-CM | POA: Diagnosis not present

## 2017-09-08 NOTE — Progress Notes (Signed)
[facility] [place]  Patient name: Ian Wong MRN: [id number] DOB: 1961/08/04 Sex: male  REASON FOR VISIT: Follow-up peripheral vascular occlusive disease  HPI: Ian Wong is a 56 y.o. male here today for follow-up. Underwent aorta bifemoral bypass February 2018 for critical limb ischemia. Had a very large nonhealing ulcer on the dorsum of his left foot. He went on to completely heal this. Fortunately he did quit smoking around the time of surgery and is remained cigarette free. He does report right calf claudication and this is moderately limiting to him. Also he has developed a ventral incisional hernia in the upper portion of his abdominal incision. No obstructive symptoms.  History reviewed. No pertinent past medical history.  Family History  Problem Relation Age of Onset  . COPD Mother   . Other Brother     SOCIAL HISTORY: Social History  Substance Use Topics  . Smoking status: Former Smoker    Quit date: 12/2016  . Smokeless tobacco: Never Used  . Alcohol use Yes     Comment: last drank etoh 1 1/2 months ago.    No Known Allergies  Current Outpatient Prescriptions  Medication Sig Dispense Refill  . aspirin EC 81 MG tablet Take 81 mg by mouth daily.    [name] atorvastatin (LIPITOR) 10 MG tablet TAKE 1 TABLET (10 MG TOTAL) BY MOUTH DAILY. (Patient taking differently: Take 20 mg by mouth daily. ) 30 tablet 1  . levothyroxine (SYNTHROID, LEVOTHROID) 50 MCG tablet Take 50 mcg by mouth daily.  0  . naproxen sodium (ALEVE) 220 MG tablet Take 220-440 mg by mouth daily as needed (for pain).    [name] oxyCODONE-acetaminophen (PERCOCET/ROXICET) 5-325 MG tablet Take 1 tablet by mouth every 6 (six) hours as needed for moderate pain or severe pain. (Patient not taking: Reported on 09/08/2017) 20 tablet 0   No current facility-administered medications for this visit.     REVIEW OF SYSTEMS:   denotes positive finding, [  ] denotes negative finding Cardiac  Comments:  Chest pain or chest pressure:    Shortness of breath upon exertion:    Short of breath when lying flat:    Irregular heart rhythm:        Vascular    Pain in calf, thigh, or hip brought on by ambulation: x   Pain in feet at night that wakes you up from your sleep:     Blood clot in your veins:    Leg swelling:           PHYSICAL EXAM: Vitals:   09/08/17 1328 09/08/17 1329  BP: (!) 145/78 140/77  Pulse: 70   Resp: 18   Temp: 98.5 F (36.9 C)   SpO2: 97%   Weight: 191 lb (86.6 kg)   Height:  (1.803 m)     GENERAL: The patient is a well-nourished male, in no acute distress. The vital signs are documented above. CARDIOVASCULAR: 2+ radial and 2+ femoral pulses bilaterally. I do not palpate pedal pulses bilaterally. Both feet are warm. Abdomen with skin intact. He has an easily reducible hernia in the upper portion of his abdominal incision PULMONARY: There is good air exchange  MUSCULOSKELETAL: There are no major deformities or cyanosis. NEUROLOGIC: No focal weakness or paresthesias are detected. SKIN: There are no ulcers or rashes noted. PSYCHIATRIC: The patient has a normal affect.  DATA:  Noninvasive vascular studies reveal ankle arm index of 0.62 on the right and 0.87 on  the left. He does have triphasic waveforms in the left and biphasic waveforms on the right  MEDICAL ISSUES: Stable status post aortobifemoral bypass grafting February 2018. He does have known superficial femoral artery occlusive disease. He does have claudication in his right leg but this is not limb threatening. I have encouraged him to continue his walking program and observation only. 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 Franky Macho, Gen. surgery in [place] for further evaluation and treatment of this. We will see him again in one year with ankle arm index. He'll notify should he develop progressive claudication  symptoms    Larina Earthly, MD FACS [facility] Tel [phone] Pager [phone]

## 2017-09-15 NOTE — Addendum Note (Signed)
Addended by: Burton Apley A on: 09/15/2017 04:35 PM   Modules accepted: Orders

## 2017-09-24 ENCOUNTER — Ambulatory Visit: Payer: Self-pay | Admitting: General Surgery

## 2017-10-06 ENCOUNTER — Encounter: Payer: Self-pay | Admitting: General Surgery

## 2017-10-06 ENCOUNTER — Ambulatory Visit (INDEPENDENT_AMBULATORY_CARE_PROVIDER_SITE_OTHER): Payer: Medicaid Other | Admitting: General Surgery

## 2017-10-06 VITALS — BP 149/64 | HR 77 | Temp 98.7°F | Resp 18 | Ht 71.0 in | Wt 196.0 lb

## 2017-10-06 DIAGNOSIS — K432 Incisional hernia without obstruction or gangrene: Secondary | ICD-10-CM | POA: Diagnosis not present

## 2017-10-06 NOTE — Patient Instructions (Signed)
 Ventral Hernia A ventral hernia is a bulge of tissue from inside the abdomen that pushes through a weak area of the muscles that form the front wall of the abdomen. The tissues inside the abdomen are inside a sac (peritoneum). These tissues include the small intestine, large intestine, and the fatty tissue that covers the intestines (omentum). Sometimes, the bulge that forms a hernia contains intestines. Other hernias contain only fat. Ventral hernias do not go away without surgical treatment. There are several types of ventral hernias. You may have:  A hernia at an incision site from previous abdominal surgery (incisional hernia).  A hernia just above the belly button (epigastric hernia), or at the belly button (umbilical hernia). These types of hernias can develop from heavy lifting or straining.  A hernia that comes and goes (reducible hernia). It may be visible only when you lift or strain. This type of hernia can be pushed back into the abdomen (reduced).  A hernia that traps abdominal tissue inside the hernia (incarcerated hernia). This type of hernia does not reduce.  A hernia that cuts off blood flow to the tissues inside the hernia (strangulated hernia). The tissues can start to die if this happens. This is a very painful bulge that cannot be reduced. A strangulated hernia is a medical emergency.  What are the causes? This condition is caused by abdominal tissue putting pressure on an area of weakness in the abdominal muscles. What increases the risk? The following factors may make you more likely to develop this condition:  Being male.  Being 60 or older.  Being overweight or obese.  Having had previous abdominal surgery, especially if there was an infection after surgery.  Having had an injury to the abdominal wall.  Having had several pregnancies.  Having a buildup of fluid inside the abdomen (ascites).  What are the signs or symptoms? The only symptom of a ventral  hernia may be a painless bulge in the abdomen. A reducible hernia may be visible only when you strain, cough, or lift. Other symptoms may include:  Dull pain.  A feeling of pressure.  Signs and symptoms of a strangulated hernia may include:  Increasing pain.  Nausea and vomiting.  Pain when pressing on the hernia.  The skin over the hernia turning red or purple.  Constipation.  Blood in the stool (feces).  How is this diagnosed? This condition may be diagnosed based on:  Your symptoms.  Your medical history.  A physical exam. You may be asked to cough or strain while standing. These actions increase the pressure inside your abdomen and force the hernia through the opening in your muscles. Your health care provider may try to reduce the hernia by pressing on it.  Imaging studies, such as an ultrasound or CT scan.  How is this treated? This condition is treated with surgery. If you have a strangulated hernia, surgery is done as soon as possible. If your hernia is small and not incarcerated, you may be asked to lose some weight before surgery. Follow these instructions at home:  Follow instructions from your health care provider about eating or drinking restrictions.  If you are overweight, your health care provider may recommend that you increase your activity level and eat a healthier diet.  Do not lift anything that is heavier than 10 lb (4.5 kg).  Return to your normal activities as told by your health care provider. Ask your health care provider what activities are safe for you. You   may need to avoid activities that increase pressure on your hernia.  Take over-the-counter and prescription medicines only as told by your health care provider.  Keep all follow-up visits as told by your health care provider. This is important. Contact a health care provider if:  Your hernia gets larger.  Your hernia becomes painful. Get help right away if:  Your hernia becomes  increasingly painful.  You have pain along with any of the following: ? Changes in skin color in the area of the hernia. ? Nausea. ? Vomiting. ? Fever. Summary  A ventral hernia is a bulge of tissue from inside the abdomen that pushes through a weak area of the muscles that form the front wall of the abdomen.  This condition is treated with surgery, which may be urgent depending on your hernia.  Do not lift anything that is heavier than 10 lb (4.5 kg), and follow activity instructions from your health care provider. This information is not intended to replace advice given to you by your health care provider. Make sure you discuss any questions you have with your health care provider. Document Released: 11/03/2012 Document Revised: 07/04/2016 Document Reviewed: 07/04/2016 Elsevier Interactive Patient Education  2018 [employer]vier Inc.  

## 2017-10-06 NOTE — Progress Notes (Signed)
Ian Wong; [id number]; 06/19/1961   HPI Patient is a 56-year-old white male who was referred to my care by Dr. Early of vascular surgery for evaluation and treatment of a ventral hernia.  Patient had abdominal aortic surgery done in January 2018.  He has done well from the surgery standpoint.  He did develop ventral hernia several months ago.  He states that is made worse with straining or sitting up from the lying position.  It does not cause him discomfort.  He denies any nausea or vomiting.  It is currently not affecting his daily lifestyle.  He has 0 out of 10 pain. History reviewed. No pertinent past medical history.  History reviewed. No pertinent surgical history.  Family History  Problem Relation Age of Onset  . COPD Mother   . Other Brother     Current Outpatient Medications on File Prior to Visit  Medication Sig Dispense Refill  . aspirin EC 81 MG tablet Take 81 mg by mouth daily.    . atorvastatin (LIPITOR) 10 MG tablet TAKE 1 TABLET (10 MG TOTAL) BY MOUTH DAILY. (Patient taking differently: Take 20 mg by mouth daily. ) 30 tablet 1  . levothyroxine (SYNTHROID, LEVOTHROID) 50 MCG tablet Take 50 mcg by mouth daily.  0  . naproxen sodium (ALEVE) 220 MG tablet Take 220-440 mg by mouth daily as needed (for pain).    . oxyCODONE-acetaminophen (PERCOCET/ROXICET) 5-325 MG tablet Take 1 tablet by mouth every 6 (six) hours as needed for moderate pain or severe pain. (Patient not taking: Reported on 09/08/2017) 20 tablet 0   No current facility-administered medications on file prior to visit.     No Known Allergies  Social History   Substance and Sexual Activity  Alcohol Use Yes   Comment: last drank etoh 1 1/2 months ago.    Social History   Tobacco Use  Smoking Status Former Smoker  . Last attempt to quit: 12/2016  . Years since quitting: 0.8  Smokeless Tobacco Never Used    Review of Systems  Constitutional: Negative.   HENT: Negative.   Eyes: Negative.    Respiratory: Negative.   Cardiovascular: Negative.   Gastrointestinal: Positive for heartburn. Negative for abdominal pain.  Genitourinary: Negative.   Musculoskeletal: Negative.   Skin: Negative.   Neurological: Negative.   Endo/Heme/Allergies: Negative.   Psychiatric/Behavioral: Negative.     Objective   Vitals:   10/06/17 1428  BP: (!) 149/64  Pulse: 77  Resp: 18  Temp: 98.7 F (37.1 C)    Physical Exam  Constitutional: He is oriented to person, place, and time and well-developed, well-nourished, and in no distress.  HENT:  Head: Normocephalic and atraumatic.  Cardiovascular: Normal rate, regular rhythm and normal heart sounds. Exam reveals no gallop and no friction rub.  No murmur heard. Pulmonary/Chest: Effort normal and breath sounds normal. No respiratory distress. He has no wheezes. He has no rales.  Abdominal: Soft. Bowel sounds are normal. He exhibits no distension. There is no tenderness. There is no rebound.  Large ovoid incisional hernia starting from subxiphoid process to umbilicus.  Approximately 12 cm in its greatest diameter.  Easily reducible.  There appears to be a remnant of fascia in the lower two thirds of it.  Neurological: He is alert and oriented to person, place, and time.  Skin: Skin is warm and dry.  Vitals reviewed.   Assessment  Incisional hernia Plan   I did discuss repair of the hernia with the patient.  This   would require mesh placement.  The risks and benefits of the procedure including bleeding, infection, mesh use, and recurrence of the hernia were fully explained to the patient.  As this is not an urgent problem for him, he would like to think about it.  That is fine with me.  The risk of incarceration is very low.  He will call me with his decision.  

## 2017-10-19 NOTE — Patient Instructions (Signed)
Ian Wong  10/19/2017     @PREFPERIOPPHARMACY @   Your procedure is scheduled on 10/26/2017.  [facility]  Call this number if you have problems the morning of surgery:  [phone]   Remember:  Do not eat food or drink liquids after midnight.  Take these medicines the morning of surgery with A SIP OF WATER Nexium, Synthroid, Oxycodone   Do not wear jewelry, make-up or nail polish.  Do not wear lotions, powders, or perfumes, or deoderant.  Do not shave 48 hours prior to surgery.  Men may shave face and neck.  Do not bring valuables to the hospital.  [facility] any belongings or valuables.  Contacts, dentures or bridgework may not be worn into surgery.  Leave your suitcase in the car.  After surgery it may be brought to your room.  For patients admitted to the hospital, discharge time will be determined by your treatment team.  Patients discharged the day of surgery will not be allowed to drive home.    Please read over the following fact sheets that you were given. Surgical Site Infection Prevention and Anesthesia Post-op Instructions     PATIENT INSTRUCTIONS POST-ANESTHESIA  IMMEDIATELY FOLLOWING SURGERY:  Do not drive or operate machinery for the first twenty four hours after surgery.  Do not make any important decisions for twenty four hours after surgery or while taking narcotic pain medications or sedatives.  If you develop intractable nausea and vomiting or a severe headache please notify your doctor immediately.  FOLLOW-UP:  Please make an appointment with your surgeon as instructed. You do not need to follow up with anesthesia unless specifically instructed to do so.  WOUND CARE INSTRUCTIONS (if applicable):  Keep a dry clean dressing on the anesthesia/puncture wound site if there is drainage.  Once the wound has quit draining you may leave it open to air.  Generally you should leave the bandage intact for  twenty four hours unless there is drainage.  If the epidural site drains for more than 36-48 hours please call the anesthesia department.  QUESTIONS?:  Please feel free to call your physician or the hospital operator if you have any questions, and they will be happy to assist you.      Laparoscopic Ventral Hernia Repair Laparoscopic ventral hernia repairis a procedure to fix a bulge of tissue that pushes through a weak area of muscle in the abdomen (ventral hernia). A ventral hernia may be at the belly button (umbilical), above the belly button (epigastric), or at the incision site from previous abdominal surgery (incisional hernia). You may have this procedure as emergency surgery if part of your intestine gets trapped inside the hernia and starts to lose its blood supply (strangulation). Laparoscopic surgery is done through small incisions using a thin surgical telescope with a light and camera on the end (laparoscope). During surgery, your surgeon will use images from the laparoscope to guide the procedure. A mesh screen will be placed in the hernia to close the opening and strengthen the abdominal wall. Tell a health care provider about:  Any allergies you have.  All medicines you are taking, including vitamins, herbs, eye drops, creams, and over-the-counter medicines.  Any problems you or family members have had with anesthetic medicines.  Any blood disorders you have.  Any surgeries you have had.  Any medical conditions you have.  Whether you are pregnant or may be pregnant. What are the risks?  Generally, this is a safe procedure. However, problems may occur, including:  Infection.  Bleeding.  Allergic reactions to medicines.  Damage to other structures or organs in the abdomen.  Trouble urinating or having a bowel movement after surgery.  Pneumonia.  Blood clots.  The hernia coming back after surgery.  Fluid buildup in the area of the hernia.  In some cases, your  health care provider may need to switch from a laparoscopic procedure to a procedure that is done through a single, larger incision in the abdomen (open procedure). You may need an open procedure if:  You have a hernia that is difficult to repair.  Your organs are hard to see.  You have bleeding problems during the laparoscopic procedure.  What happens before the procedure? Staying hydrated Follow instructions from your health care provider about hydration, which may include:  Up to 2 hours before the procedure - you may continue to drink clear liquids, such as water, clear fruit juice, black coffee, and plain tea.  Eating and drinking restrictions Follow instructions from your health care provider about eating and drinking, which may include:  8 hours before the procedure - stop eating heavy meals or foods such as meat, fried foods, or fatty foods.  6 hours before the procedure - stop eating light meals or foods, such as toast or cereal.  6 hours before the procedure - stop drinking milk or drinks that contain milk.  2 hours before the procedure - stop drinking clear liquids.  Medicines  Ask your health care provider about: ? Changing or stopping your regular medicines. This is especially important if you are taking diabetes medicines or blood thinners. ? Taking medicines such as aspirin and ibuprofen. These medicines can thin your blood. Do not take these medicines before your procedure if your health care provider instructs you not to.  You may be given antibiotic medicine to help prevent infection. General instructions  You may be asked to take a laxative or do an enema to empty your bowel before surgery (bowel prep).  Do not use any products that contain nicotine or tobacco, such as cigarettes and e-cigarettes. If you need help quitting, ask your health care provider.  You may need to have tests before the procedure, such as: ? Blood tests. ? Urine tests. ? Abdominal  ultrasound. ? Chest X-ray. ? Electrocardiogram (ECG).  Plan to have someone take you home from the hospital or clinic.  If you will be going home right after the procedure, plan to have someone with you for 24 hours. What happens during the procedure?  To reduce your risk of infection: ? Your health care team will wash or sanitize their hands. ? Your skin will be washed with soap.  An IV tube will be inserted into one of your veins.  You will be given one or more of the following: ? A medicine to help you relax (sedative). ? A medicine to make you fall asleep (general anesthetic).  A small incision will be made in your abdomen. A hollow metal tube (trocar) will be placed through the incision.  A tube will be placed through the trocar to inflate your abdomen with air-like gas. This makes it easier for your surgeon to see inside your abdomen and do the repair.  The laparoscope will be inserted into your abdomen through the trocar. The laparoscope will send images to a monitor in the operating room.  Other trocars will be put through other small incisions in your  abdomen. The surgical instruments needed for the procedure will be placed through these trocars.  The tissue or intestines that make up the hernia will be moved back into place.  The edges of the hernia may be stitched together.  A piece of mesh will be used to close the hernia. Stitches (sutures), clips, or staples will be used to keep the mesh in place.  A bandage (dressing) or skin glue will be put over the incisions. The procedure may vary among health care providers and hospitals. What happens after the procedure?  Your blood pressure, heart rate, breathing rate, and blood oxygen level will be monitored until the medicines you were given have worn off.  You will continue to receive fluids and medicines through an IV tube. Your IV tube will be removed when you can drink clear fluids.  You will be given pain medicine  as needed.  You will be encouraged to get up and walk around as soon as possible.  You may have to wear compression stockings. These stockings help to prevent blood clots and reduce swelling in your legs.  You will be shown how to do deep breathing exercises to help prevent a lung infection.  Do not drive for 24 hours if you were given a sedative. This information is not intended to replace advice given to you by your health care provider. Make sure you discuss any questions you have with your health care provider. Document Released: 11/03/2012 Document Revised: 07/04/2016 Document Reviewed: 07/04/2016 Elsevier Interactive Patient Education  [employer]2018 [employer] Inc.

## 2017-10-20 ENCOUNTER — Encounter (HOSPITAL_COMMUNITY): Payer: Self-pay

## 2017-10-20 ENCOUNTER — Other Ambulatory Visit: Payer: Self-pay

## 2017-10-20 ENCOUNTER — Encounter (HOSPITAL_COMMUNITY)
Admission: RE | Admit: 2017-10-20 | Discharge: 2017-10-20 | Disposition: A | Discharge status: Home / Self Care | Payer: Medicaid Other | Source: Ambulatory Visit | Attending: General Surgery | Admitting: General Surgery

## 2017-10-20 DIAGNOSIS — Z79899 Other long term (current) drug therapy: Secondary | ICD-10-CM | POA: Diagnosis not present

## 2017-10-20 DIAGNOSIS — Z87891 Personal history of nicotine dependence: Secondary | ICD-10-CM | POA: Diagnosis not present

## 2017-10-20 DIAGNOSIS — Z79891 Long term (current) use of opiate analgesic: Secondary | ICD-10-CM | POA: Insufficient documentation

## 2017-10-20 DIAGNOSIS — Z7982 Long term (current) use of aspirin: Secondary | ICD-10-CM | POA: Insufficient documentation

## 2017-10-20 DIAGNOSIS — Z825 Family history of asthma and other chronic lower respiratory diseases: Secondary | ICD-10-CM | POA: Insufficient documentation

## 2017-10-20 DIAGNOSIS — K439 Ventral hernia without obstruction or gangrene: Secondary | ICD-10-CM | POA: Insufficient documentation

## 2017-10-20 DIAGNOSIS — Z9889 Other specified postprocedural states: Secondary | ICD-10-CM | POA: Insufficient documentation

## 2017-10-20 DIAGNOSIS — Z01818 Encounter for other preprocedural examination: Secondary | ICD-10-CM | POA: Diagnosis present

## 2017-10-20 HISTORY — DX: Hypothyroidism, unspecified: E03.9

## 2017-10-20 HISTORY — DX: Gastro-esophageal reflux disease without esophagitis: K21.9

## 2017-10-20 HISTORY — DX: Unspecified convulsions: R56.9

## 2017-10-20 LAB — BASIC METABOLIC PANEL
Anion gap: 8 (ref 5–15)
BUN: 12 mg/dL (ref 6–20)
CHLORIDE: 106 mmol/L (ref 101–111)
CO2: 23 mmol/L (ref 22–32)
Calcium: 9.3 mg/dL (ref 8.9–10.3)
Creatinine, Ser: 1.2 mg/dL (ref 0.61–1.24)
GFR calc Af Amer: >60 mL/min (ref >60)
GFR calc non Af Amer: >60 mL/min (ref >60)
Glucose, Bld: 90 mg/dL (ref 65–99)
POTASSIUM: 3.8 mmol/L (ref 3.5–5.1)
SODIUM: 137 mmol/L (ref 135–145)

## 2017-10-20 LAB — CBC
HCT: 45.9 % (ref 39.0–52.0)
HEMOGLOBIN: 15.2 g/dL (ref 13.0–17.0)
MCH: 29.3 pg (ref 26.0–34.0)
MCHC: 33.1 g/dL (ref 30.0–36.0)
MCV: 88.4 fL (ref 78.0–100.0)
Platelets: 265 10*3/uL (ref 150–400)
RBC: 5.19 MIL/uL (ref 4.22–5.81)
RDW: 12.9 % (ref 11.5–15.5)
WBC: 14.4 10*3/uL — ABNORMAL HIGH (ref 4.0–10.5)

## 2017-10-20 NOTE — H&P (Signed)
Ian Wong; [id number]; 09/16/1961   HPI Patient is a 56 year old white male who was referred to my care by Dr. Arbie CookeyEarly of vascular surgery for evaluation and treatment of a ventral hernia.  Patient had abdominal aortic surgery done in January 2018.  He has done well from the surgery standpoint.  He did develop ventral hernia several months ago.  He states that is made worse with straining or sitting up from the lying position.  It does not cause him discomfort.  He denies any nausea or vomiting.  It is currently not affecting his daily lifestyle.  He has 0 out of 10 pain. History reviewed. No pertinent past medical history.  History reviewed. No pertinent surgical history.  Family History  Problem Relation Age of Onset  . COPD Mother   . Other Brother     Current Outpatient Medications on File Prior to Visit  Medication Sig Dispense Refill  . aspirin EC 81 MG tablet Take 81 mg by mouth daily.    [name]. atorvastatin (LIPITOR) 10 MG tablet TAKE 1 TABLET (10 MG TOTAL) BY MOUTH DAILY. (Patient taking differently: Take 20 mg by mouth daily. ) 30 tablet 1  . levothyroxine (SYNTHROID, LEVOTHROID) 50 MCG tablet Take 50 mcg by mouth daily.  0  . naproxen sodium (ALEVE) 220 MG tablet Take 220-440 mg by mouth daily as needed (for pain).    [name]. oxyCODONE-acetaminophen (PERCOCET/ROXICET) 5-325 MG tablet Take 1 tablet by mouth every 6 (six) hours as needed for moderate pain or severe pain. (Patient not taking: Reported on 09/08/2017) 20 tablet 0   No current facility-administered medications on file prior to visit.     No Known Allergies  Social History   Substance and Sexual Activity  Alcohol Use Yes   Comment: last drank etoh 1 1/2 months ago.    Social History   Tobacco Use  Smoking Status Former Smoker  . Last attempt to quit: 12/2016  . Years since quitting: 0.8  Smokeless Tobacco Never Used    Review of Systems  Constitutional: Negative.   HENT: Negative.   Eyes: Negative.    Respiratory: Negative.   Cardiovascular: Negative.   Gastrointestinal: Positive for heartburn. Negative for abdominal pain.  Genitourinary: Negative.   Musculoskeletal: Negative.   Skin: Negative.   Neurological: Negative.   Endo/Heme/Allergies: Negative.   Psychiatric/Behavioral: Negative.     Objective   Vitals:   10/06/17 1428  BP: (!) 149/64  Pulse: 77  Resp: 18  Temp: 98.7 F (37.1 C)    Physical Exam  Constitutional: He is oriented to person, place, and time and well-developed, well-nourished, and in no distress.  HENT:  Head: Normocephalic and atraumatic.  Cardiovascular: Normal rate, regular rhythm and normal heart sounds. Exam reveals no gallop and no friction rub.  No murmur heard. Pulmonary/Chest: Effort normal and breath sounds normal. No respiratory distress. He has no wheezes. He has no rales.  Abdominal: Soft. Bowel sounds are normal. He exhibits no distension. There is no tenderness. There is no rebound.  Large ovoid incisional hernia starting from subxiphoid process to umbilicus.  Approximately 12 cm in its greatest diameter.  Easily reducible.  There appears to be a remnant of fascia in the lower two thirds of it.  Neurological: He is alert and oriented to person, place, and time.  Skin: Skin is warm and dry.  Vitals reviewed.   Assessment  Incisional hernia Plan   I did discuss repair of the hernia with the patient.  This  would require mesh placement.  The risks and benefits of the procedure including bleeding, infection, mesh use, and recurrence of the hernia were fully explained to the patient.  As this is not an urgent problem for him, he would like to think about it.  That is fine with me.  The risk of incarceration is very low.  He will call me with his decision.

## 2017-10-21 NOTE — Progress Notes (Signed)
Dr Lovell SheehanJenkins notified of WBC 14.4.  No further action needed.

## 2017-10-26 ENCOUNTER — Ambulatory Visit (HOSPITAL_COMMUNITY): Payer: Medicaid Other | Admitting: Anesthesiology

## 2017-10-26 ENCOUNTER — Encounter (HOSPITAL_COMMUNITY)
Admission: RE | Disposition: A | Discharge status: Home / Self Care | Payer: Self-pay | Source: Ambulatory Visit | Attending: General Surgery

## 2017-10-26 ENCOUNTER — Ambulatory Visit (HOSPITAL_COMMUNITY)
Admission: RE | Admit: 2017-10-26 | Discharge: 2017-10-26 | Disposition: A | Discharge status: Home / Self Care | Payer: Medicaid Other | Source: Ambulatory Visit | Attending: General Surgery | Admitting: General Surgery

## 2017-10-26 ENCOUNTER — Encounter (HOSPITAL_COMMUNITY): Payer: Self-pay | Admitting: *Deleted

## 2017-10-26 DIAGNOSIS — Z79899 Other long term (current) drug therapy: Secondary | ICD-10-CM | POA: Insufficient documentation

## 2017-10-26 DIAGNOSIS — K219 Gastro-esophageal reflux disease without esophagitis: Secondary | ICD-10-CM | POA: Diagnosis not present

## 2017-10-26 DIAGNOSIS — Z87891 Personal history of nicotine dependence: Secondary | ICD-10-CM | POA: Diagnosis not present

## 2017-10-26 DIAGNOSIS — Z7982 Long term (current) use of aspirin: Secondary | ICD-10-CM | POA: Insufficient documentation

## 2017-10-26 DIAGNOSIS — K432 Incisional hernia without obstruction or gangrene: Secondary | ICD-10-CM

## 2017-10-26 HISTORY — PX: INCISIONAL HERNIA REPAIR: SHX193

## 2017-10-26 SURGERY — REPAIR, HERNIA, INCISIONAL
Anesthesia: General | Site: Abdomen

## 2017-10-26 MED ORDER — BUPIVACAINE LIPOSOME 1.3 % IJ SUSP
INTRAMUSCULAR | Status: DC | PRN
  Administered 2017-10-26: 20 mL

## 2017-10-26 MED ORDER — LACTATED RINGERS IV SOLN
INTRAVENOUS | Status: DC
  Administered 2017-10-26: 1000 mL via INTRAVENOUS
  Administered 2017-10-26: 11:00:00 via INTRAVENOUS

## 2017-10-26 MED ORDER — IPRATROPIUM-ALBUTEROL 0.5-2.5 (3) MG/3ML IN SOLN
RESPIRATORY_TRACT | Status: AC
  Filled 2017-10-26: qty 3

## 2017-10-26 MED ORDER — CEFAZOLIN SODIUM-DEXTROSE 2-4 GM/100ML-% IV SOLN
2.0000 g | INTRAVENOUS | Status: AC
  Administered 2017-10-26: 2 g via INTRAVENOUS
  Filled 2017-10-26: qty 100

## 2017-10-26 MED ORDER — ROCURONIUM BROMIDE 100 MG/10ML IV SOLN
INTRAVENOUS | Status: DC | PRN
  Administered 2017-10-26: 35 mg via INTRAVENOUS
  Administered 2017-10-26: 5 mg via INTRAVENOUS

## 2017-10-26 MED ORDER — PROPOFOL 10 MG/ML IV BOLUS
INTRAVENOUS | Status: DC | PRN
  Administered 2017-10-26: 150 mg via INTRAVENOUS

## 2017-10-26 MED ORDER — POVIDONE-IODINE 10 % EX OINT
TOPICAL_OINTMENT | CUTANEOUS | Status: AC
  Filled 2017-10-26: qty 1

## 2017-10-26 MED ORDER — FENTANYL CITRATE (PF) 100 MCG/2ML IJ SOLN
INTRAMUSCULAR | Status: DC | PRN
  Administered 2017-10-26 (×3): 50 ug via INTRAVENOUS
  Administered 2017-10-26: 100 ug via INTRAVENOUS

## 2017-10-26 MED ORDER — PROPOFOL 10 MG/ML IV BOLUS
INTRAVENOUS | Status: AC
  Filled 2017-10-26: qty 20

## 2017-10-26 MED ORDER — SODIUM CHLORIDE 0.9 % IJ SOLN
INTRAMUSCULAR | Status: AC
  Filled 2017-10-26: qty 10

## 2017-10-26 MED ORDER — OXYCODONE-ACETAMINOPHEN 7.5-325 MG PO TABS: 1.0000 | ORAL_TABLET | ORAL | 0 refills | Status: DC | PRN

## 2017-10-26 MED ORDER — ROCURONIUM BROMIDE 50 MG/5ML IV SOLN
INTRAVENOUS | Status: AC
  Filled 2017-10-26: qty 1

## 2017-10-26 MED ORDER — CHLORHEXIDINE GLUCONATE CLOTH 2 % EX PADS: 6.0000 | MEDICATED_PAD | Freq: Once | CUTANEOUS | Status: DC

## 2017-10-26 MED ORDER — FENTANYL CITRATE (PF) 250 MCG/5ML IJ SOLN
INTRAMUSCULAR | Status: AC
  Filled 2017-10-26: qty 5

## 2017-10-26 MED ORDER — LIDOCAINE HCL 1 % IJ SOLN
INTRAMUSCULAR | Status: DC | PRN
  Administered 2017-10-26: 30 mg via INTRADERMAL

## 2017-10-26 MED ORDER — DEXAMETHASONE SODIUM PHOSPHATE 4 MG/ML IJ SOLN
4.0000 mg | Freq: Once | INTRAMUSCULAR | Status: AC
  Administered 2017-10-26: 4 mg via INTRAVENOUS

## 2017-10-26 MED ORDER — POVIDONE-IODINE 10 % OINT PACKET
TOPICAL_OINTMENT | CUTANEOUS | Status: DC | PRN
  Administered 2017-10-26: 1 via TOPICAL

## 2017-10-26 MED ORDER — IPRATROPIUM-ALBUTEROL 0.5-2.5 (3) MG/3ML IN SOLN
3.0000 mL | Freq: Once | RESPIRATORY_TRACT | Status: AC
  Administered 2017-10-26: 3 mL via RESPIRATORY_TRACT

## 2017-10-26 MED ORDER — EPHEDRINE SULFATE 50 MG/ML IJ SOLN
INTRAMUSCULAR | Status: AC
  Filled 2017-10-26: qty 1

## 2017-10-26 MED ORDER — DEXAMETHASONE SODIUM PHOSPHATE 4 MG/ML IJ SOLN
INTRAMUSCULAR | Status: AC
  Filled 2017-10-26: qty 1

## 2017-10-26 MED ORDER — LIDOCAINE HCL (PF) 1 % IJ SOLN
INTRAMUSCULAR | Status: AC
  Filled 2017-10-26: qty 5

## 2017-10-26 MED ORDER — GLYCOPYRROLATE 0.2 MG/ML IJ SOLN: 0.1000 mg | Freq: Once | INTRAMUSCULAR | Status: DC

## 2017-10-26 MED ORDER — BUPIVACAINE LIPOSOME 1.3 % IJ SUSP
INTRAMUSCULAR | Status: AC
  Filled 2017-10-26: qty 20

## 2017-10-26 MED ORDER — SUCCINYLCHOLINE CHLORIDE 20 MG/ML IJ SOLN
INTRAMUSCULAR | Status: AC
  Filled 2017-10-26: qty 1

## 2017-10-26 MED ORDER — MIDAZOLAM HCL 2 MG/2ML IJ SOLN
INTRAMUSCULAR | Status: AC
  Filled 2017-10-26: qty 2

## 2017-10-26 MED ORDER — SUGAMMADEX SODIUM 500 MG/5ML IV SOLN
INTRAVENOUS | Status: DC | PRN
  Administered 2017-10-26: 200 mg via INTRAVENOUS

## 2017-10-26 MED ORDER — MIDAZOLAM HCL 2 MG/2ML IJ SOLN
1.0000 mg | INTRAMUSCULAR | Status: AC
  Administered 2017-10-26: 2 mg via INTRAVENOUS

## 2017-10-26 MED ORDER — FENTANYL CITRATE (PF) 100 MCG/2ML IJ SOLN
25.0000 ug | INTRAMUSCULAR | Status: DC | PRN
  Administered 2017-10-26 (×2): 25 ug via INTRAVENOUS
  Administered 2017-10-26 (×3): 50 ug via INTRAVENOUS
  Filled 2017-10-26 (×2): qty 2

## 2017-10-26 MED ORDER — MIDAZOLAM HCL 5 MG/5ML IJ SOLN
INTRAMUSCULAR | Status: DC | PRN
  Administered 2017-10-26: 2 mg via INTRAVENOUS

## 2017-10-26 MED ORDER — GLYCOPYRROLATE 0.2 MG/ML IJ SOLN
INTRAMUSCULAR | Status: AC
  Filled 2017-10-26: qty 1

## 2017-10-26 MED ORDER — 0.9 % SODIUM CHLORIDE (POUR BTL) OPTIME
TOPICAL | Status: DC | PRN
  Administered 2017-10-26: 1000 mL

## 2017-10-26 MED ORDER — GLYCOPYRROLATE 0.2 MG/ML IJ SOLN: 0.2000 mg | Freq: Once | INTRAMUSCULAR | Status: DC

## 2017-10-26 SURGICAL SUPPLY — 36 items
BAG HAMPER (MISCELLANEOUS) ×3 IMPLANT
BINDER ×3 IMPLANT
BLADE SURG SZ11 CARB STEEL (BLADE) ×3 IMPLANT
CHLORAPREP W/TINT 26ML (MISCELLANEOUS) ×3 IMPLANT
CLOTH BEACON ORANGE TIMEOUT ST (SAFETY) ×3 IMPLANT
COVER LIGHT HANDLE STERIS (MISCELLANEOUS) ×6 IMPLANT
DRSG OPSITE POSTOP 4X10 (GAUZE/BANDAGES/DRESSINGS) ×3 IMPLANT
ELECT REM PT RETURN 9FT ADLT (ELECTROSURGICAL) ×3
ELECTRODE REM PT RTRN 9FT ADLT (ELECTROSURGICAL) ×1 IMPLANT
GAUZE SPONGE 4X4 12PLY STRL (GAUZE/BANDAGES/DRESSINGS) ×3 IMPLANT
GLOVE BIOGEL PI IND STRL 6.5 (GLOVE) ×2 IMPLANT
GLOVE BIOGEL PI IND STRL 7.0 (GLOVE) ×1 IMPLANT
GLOVE BIOGEL PI INDICATOR 6.5 (GLOVE) ×4
GLOVE BIOGEL PI INDICATOR 7.0 (GLOVE) ×2
GLOVE ECLIPSE 6.5 STRL STRAW (GLOVE) ×6 IMPLANT
GLOVE SURG SS PI 7.5 STRL IVOR (GLOVE) ×3 IMPLANT
GOWN STRL REUS W/ TWL LRG LVL3 (GOWN DISPOSABLE) ×4 IMPLANT
GOWN STRL REUS W/TWL LRG LVL3 (GOWN DISPOSABLE) ×8
INST SET MAJOR GENERAL (KITS) ×3 IMPLANT
KIT ROOM TURNOVER APOR (KITS) ×3 IMPLANT
LIGASURE IMPACT 36 18CM CVD LR (INSTRUMENTS) ×3 IMPLANT
MANIFOLD NEPTUNE II (INSTRUMENTS) ×3 IMPLANT
MESH VENTRALIGHT ST 8X10 (Mesh General) ×3 IMPLANT
NEEDLE HYPO 21X1.5 SAFETY (NEEDLE) ×3 IMPLANT
NS IRRIG 1000ML POUR BTL (IV SOLUTION) ×3 IMPLANT
PACK ABDOMINAL MAJOR (CUSTOM PROCEDURE TRAY) ×3 IMPLANT
PAD ARMBOARD 7.5X6 YLW CONV (MISCELLANEOUS) ×3 IMPLANT
SET BASIN LINEN APH (SET/KITS/TRAYS/PACK) ×3 IMPLANT
STAPLER VISISTAT (STAPLE) ×3 IMPLANT
SUT ETHIBOND 0 MO6 C/R (SUTURE) ×3 IMPLANT
SUT NOVA NAB GS-26 0 60 (SUTURE) ×9 IMPLANT
SUT PROLENE 0 CT 1 CR/8 (SUTURE) ×9 IMPLANT
SUT VIC AB 3-0 SH 27 (SUTURE) ×2
SUT VIC AB 3-0 SH 27X BRD (SUTURE) ×1 IMPLANT
SYR 20CC LL (SYRINGE) ×3 IMPLANT
YANKAUER SUCT BULB TIP NO VENT (SUCTIONS) ×3 IMPLANT

## 2017-10-26 NOTE — Anesthesia Preprocedure Evaluation (Signed)
Anesthesia Evaluation  Patient identified by MRN, date of birth, ID band Patient awake    Reviewed: Allergy & Precautions, NPO status , Patient's Chart, lab work & pertinent test results  History of Anesthesia Complications Negative for: history of anesthetic complications  Airway Mallampati: II  TM Distance: >3 FB Neck ROM: Full    Dental  (+) Teeth Intact, Poor Dentition, Missing, Dental Advisory Given   Pulmonary former smoker,    breath sounds clear to auscultation       Cardiovascular + Peripheral Vascular Disease (s/p ABF 1-18)   Rhythm:Regular Rate:Normal     Neuro/Psych Seizures - (inactive now),     GI/Hepatic negative GI ROS, Neg liver ROS, GERD  Controlled and Medicated,  Endo/Other  negative endocrine ROSHypothyroidism   Renal/GU negative Renal ROS     Musculoskeletal   Abdominal   Peds  Hematology negative hematology ROS (+)   Anesthesia Other Findings   Reproductive/Obstetrics                             Anesthesia Physical Anesthesia Plan  ASA: III  Anesthesia Plan: General   Post-op Pain Management:    Induction: Intravenous  PONV Risk Score and Plan:   Airway Management Planned: Oral ETT  Additional Equipment:   Intra-op Plan:   Post-operative Plan: Extubation in OR  Informed Consent: I have reviewed the patients History and Physical, chart, labs and discussed the procedure including the risks, benefits and alternatives for the proposed anesthesia with the patient or authorized representative who has indicated his/her understanding and acceptance.     Plan Discussed with:   Anesthesia Plan Comments:         Anesthesia Quick Evaluation

## 2017-10-26 NOTE — Interval H&P Note (Signed)
History and Physical Interval Note:  10/26/2017 8:15 AM  Ian Wong Ian Wong  has presented today for surgery, with the diagnosis of incisional hernia  The various methods of treatment have been discussed with the patient and family. After consideration of risks, benefits and other options for treatment, the patient has consented to  Procedure(s): HERNIA REPAIR INCISIONAL WITH MESH (N/A) as a surgical intervention .  The patient's history has been reviewed, patient examined, no change in status, stable for surgery.  I have reviewed the patient's chart and labs.  Questions were answered to the patient's satisfaction.     Franky MachoMark Neziah Vogelgesang

## 2017-10-26 NOTE — Op Note (Signed)
Patient:  Ian Wong, [name]  DOB:  04/12/1961   Preop Diagnosis: Incisional hernia  Postop Diagnosis: Same  Procedure: Incisional herniorrhaphy with mesh  Surgeon: Franky MachoMark Daylah Sayavong, MD  Assistant: Larae GroomsLindsey Bridges, MD  Anes: General endotracheal  Indications: Patient is a 56 year old white male who presents with an incisional hernia from previous abdominal surgery in the past.  Risks and benefits of the procedure including bleeding, infection, mesh use, and the possibility of recurrence of the hernia were fully explained to the patient, who gave informed consent.  Procedure note: The patient was placed in the supine position.  After induction of general endotracheal anesthesia, the abdomen was prepped and draped using the usual sterile technique with DuraPrep.  Surgical site confirmation was performed.  Incision was made through the previous surgical incision line from the subxiphoid process to the umbilicus.  The peritoneum was entered into without difficulty.  There was significant retraction of the rectus muscle especially to the left side.  A separate smaller bridge of fascia was noted just above the umbilicus and this was incised in order to make the whole hernia defect 1.  A small umbilical hernia was present, but this was to be covered with the mesh repair.  The fascia had thinned out.  Omental attachments to the anterior abdominal wall were freed away using the LigaSure without difficulty.  A 20 x 25 cm Bard Ventralight mesh was then inserted and tacked to the abdominal wall using 0 Prolene interrupted sutures with 2 layer fashion.  The fascia was then reapproximated over the mesh repair using a running looped 0 Novafil suture.  Subcutaneous layer was irrigated with normal saline.  Exparel was instilled into the wound.  The skin was closed using staples.  Betadine ointment and dry sterile dressings were applied.  All tape needle counts were correct at the end of the procedure.  The patient was  extubated in the operating room and transferred to PACU in stable condition.  Complications: None  EBL: Minimal  Specimen: None

## 2017-10-26 NOTE — Discharge Instructions (Addendum)
Open Hernia Repair, Adult, Care After °This sheet gives you information about how to care for yourself after your procedure. Your health care provider may also give you more specific instructions. If you have problems or questions, contact your health care provider. °What can I expect after the procedure? °After the procedure, it is common to have: °· Mild discomfort. °· Slight bruising. °· Minor swelling. °· Pain in the abdomen. ° °Follow these instructions at home: °Incision care ° °· Follow instructions from your health care provider about how to take care of your incision area. Make sure you: °? Wash your hands with soap and water before you change your bandage (dressing). If soap and water are not available, use hand sanitizer. °? Change your dressing as told by your health care provider. °? Leave stitches (sutures), skin glue, or adhesive strips in place. These skin closures may need to stay in place for 2 weeks or longer. If adhesive strip edges start to loosen and curl up, you may trim the loose edges. Do not remove adhesive strips completely unless your health care provider tells you to do that. °· Check your incision area every day for signs of infection. Check for: °? More redness, swelling, or pain. °? More fluid or blood. °? Warmth. °? Pus or a bad smell. °Activity °· Do not drive or use heavy machinery while taking prescription pain medicine. Do not drive until your health care provider approves. °· Until your health care provider approves: °? Do not lift anything that is heavier than 10 lb (4.5 kg). °? Do not play contact sports. °· Return to your normal activities as told by your health care provider. Ask your health care provider what activities are safe. °General instructions °· To prevent or treat constipation while you are taking prescription pain medicine, your health care provider may recommend that you: °? Drink enough fluid to keep your urine clear or pale yellow. °? Take over-the-counter or  prescription medicines. °? Eat foods that are high in fiber, such as fresh fruits and vegetables, whole grains, and beans. °? Limit foods that are high in fat and processed sugars, such as fried and sweet foods. °· Take over-the-counter and prescription medicines only as told by your health care provider. °· Do not take tub baths or go swimming until your health care provider approves. °· Keep all follow-up visits as told by your health care provider. This is important. °Contact a health care provider if: °· You develop a rash. °· You have more redness, swelling, or pain around your incision. °· You have more fluid or blood coming from your incision. °· Your incision feels warm to the touch. °· You have pus or a bad smell coming from your incision. °· You have a fever or chills. °· You have blood in your stool (feces). °· You have not had a bowel movement in 2-3 days. °· Your pain is not controlled with medicine. °Get help right away if: °· You have chest pain or shortness of breath. °· You feel light-headed or feel faint. °· You have severe pain. °· You vomit and your pain is worse. °This information is not intended to replace advice given to you by your health care provider. Make sure you discuss any questions you have with your health care provider. °Document Released: 06/06/2005 Document Revised: 06/06/2016 Document Reviewed: 04/30/2016 °Elsevier Interactive Patient Education © 2017 [employer] ° °

## 2017-10-26 NOTE — Transfer of Care (Signed)
Immediate Anesthesia Transfer of Care Note  Patient: Ian Wong  Procedure(s) Performed: HERNIA REPAIR INCISIONAL WITH MESH (N/A Abdomen)  Patient Location: PACU  Anesthesia Type:General  Level of Consciousness: awake and patient cooperative  Airway & Oxygen Therapy: Patient Spontanous Breathing and Patient connected to face mask oxygen  Post-op Assessment: Report given to RN, Post -op Vital signs reviewed and stable and Patient moving all extremities  Post vital signs: Reviewed and stable  Last Vitals:  Vitals:   10/26/17 0850 10/26/17 0855  BP: 131/60 125/65  Pulse:    Resp: 19 16  Temp:    SpO2: 98% 97%    Last Pain:  Vitals:   10/26/17 0752  TempSrc: Oral  PainSc: 0-No pain      Patients Stated Pain Goal: 6 (10/26/17 0752)  Complications: No apparent anesthesia complications

## 2017-10-26 NOTE — Anesthesia Postprocedure Evaluation (Signed)
Anesthesia Post Note  Patient: Ian Wong  Procedure(s) Performed: HERNIA REPAIR INCISIONAL WITH MESH (N/A Abdomen)  Patient location during evaluation: PACU Anesthesia Type: General Level of consciousness: awake and alert and patient cooperative Pain management: pain level controlled Vital Signs Assessment: post-procedure vital signs reviewed and stable Respiratory status: spontaneous breathing, nonlabored ventilation and respiratory function stable Cardiovascular status: blood pressure returned to baseline Postop Assessment: no apparent nausea or vomiting Anesthetic complications: no     Last Vitals:  Vitals:   10/26/17 0855 10/26/17 1030  BP: 125/65   Pulse:    Resp: 16   Temp:  36.5 C  SpO2: 97% 92%    Last Pain:  Vitals:   10/26/17 1050  TempSrc:   PainSc: 4                  Airika Alkhatib J

## 2017-10-26 NOTE — Anesthesia Procedure Notes (Signed)
Procedure Name: Intubation Date/Time: 10/26/2017 9:12 AM Performed by: Charmaine Downs, CRNA Pre-anesthesia Checklist: Patient identified, Patient being monitored, Timeout performed, Emergency Drugs available and Suction available Patient Re-evaluated:Patient Re-evaluated prior to induction Oxygen Delivery Method: Circle System Utilized Preoxygenation: Pre-oxygenation with 100% oxygen Induction Type: IV induction Ventilation: Mask ventilation without difficulty Laryngoscope Size: Mac and 4 Grade View: Grade I Tube type: Oral Tube size: 8.0 mm Number of attempts: 1 Airway Equipment and Method: stylet Placement Confirmation: ETT inserted through vocal cords under direct vision,  positive ETCO2 and breath sounds checked- equal and bilateral Secured at: 22 cm Tube secured with: Tape Dental Injury: Teeth and Oropharynx as per pre-operative assessment

## 2017-10-27 ENCOUNTER — Encounter (HOSPITAL_COMMUNITY): Payer: Self-pay | Admitting: General Surgery

## 2017-11-03 ENCOUNTER — Other Ambulatory Visit: Payer: Self-pay | Admitting: General Surgery

## 2017-11-03 MED ORDER — OXYCODONE-ACETAMINOPHEN 7.5-325 MG PO TABS: 1.0000 | ORAL_TABLET | Freq: Four times a day (QID) | ORAL | 0 refills | Status: AC | PRN

## 2017-11-10 ENCOUNTER — Ambulatory Visit: Payer: Medicaid Other | Admitting: General Surgery

## 2017-11-12 ENCOUNTER — Encounter: Payer: Self-pay | Admitting: General Surgery

## 2017-11-12 ENCOUNTER — Ambulatory Visit (INDEPENDENT_AMBULATORY_CARE_PROVIDER_SITE_OTHER): Payer: Self-pay | Admitting: General Surgery

## 2017-11-12 VITALS — BP 155/77 | HR 88 | Temp 98.6°F | Ht 71.0 in | Wt 190.0 lb

## 2017-11-12 DIAGNOSIS — Z09 Encounter for follow-up examination after completed treatment for conditions other than malignant neoplasm: Secondary | ICD-10-CM

## 2017-11-12 NOTE — Progress Notes (Signed)
Subjective:     Ian Wong  Status post incisional herniorrhaphy with mesh.  Doing well.  Has 0 out of 10 pain. Objective:    BP (!) 155/77   Pulse 88   Temp 98.6 F (37 C)   Ht 5\' 11"  (1.803 m)   Wt 190 lb (86.2 kg)   BMI 26.50 kg/m   General:  alert, cooperative and no distress  Abdomen soft, incision healing well.  Staples removed, Steri-Strips applied.     Assessment:    Doing well postoperatively.    Plan:   Increase activity as able to.  Avoid heavy lifting for the next few weeks.  May use abdominal binder as needed.  Follow-up here as needed.

## 2018-09-14 ENCOUNTER — Encounter (HOSPITAL_COMMUNITY): Payer: Medicaid Other

## 2018-09-14 ENCOUNTER — Other Ambulatory Visit: Payer: Self-pay

## 2018-09-14 ENCOUNTER — Ambulatory Visit (INDEPENDENT_AMBULATORY_CARE_PROVIDER_SITE_OTHER): Payer: Medicaid Other | Admitting: Vascular Surgery

## 2018-09-14 ENCOUNTER — Encounter

## 2018-09-14 ENCOUNTER — Ambulatory Visit (HOSPITAL_COMMUNITY)
Admission: RE | Admit: 2018-09-14 | Discharge: 2018-09-14 | Disposition: A | Discharge status: Home / Self Care | Payer: Self-pay | Source: Ambulatory Visit | Attending: Vascular Surgery | Admitting: Vascular Surgery

## 2018-09-14 ENCOUNTER — Encounter: Payer: Self-pay | Admitting: Vascular Surgery

## 2018-09-14 ENCOUNTER — Ambulatory Visit: Payer: Medicaid Other | Admitting: Vascular Surgery

## 2018-09-14 VITALS — BP 121/63 | HR 82 | Resp 20 | Ht 71.0 in | Wt 204.0 lb

## 2018-09-14 DIAGNOSIS — I7409 Other arterial embolism and thrombosis of abdominal aorta: Secondary | ICD-10-CM

## 2018-09-14 NOTE — Progress Notes (Signed)
[facility] [place]  Patient name: Ian Wong MRN: [id number] DOB: 04-Aug-1961 Sex: male  REASON FOR VISIT: Aortobifemoral bypass follow-up  HPI: Ian Wong is a 57 y.o. male here for follow-up of aortobifemoral bypass grafting on 01/07/2017 he has known superficial femoral artery occlusive disease.  He had extensive tissue loss on the dorsum of his left foot which she was able to heal.  He does report some calf claudication right greater than left but reports that this is tolerable.  No new tissue loss.  He quit smoking at the time of his surgery in February 2018 and has not resumed.  I congratulated him on this.  Past Medical History:  Diagnosis Date  . GERD (gastroesophageal reflux disease)   . Hypothyroidism   . Seizures (HCC)     Family History  Problem Relation Age of Onset  . COPD Mother   . Other Brother     SOCIAL HISTORY: Social History   Tobacco Use  . Smoking status: Former Smoker    Types: Cigarettes    Last attempt to quit: 12/2016    Years since quitting: 1.7  . Smokeless tobacco: Never Used  Substance Use Topics  . Alcohol use: Yes    Comment: last drank etoh 1 1/2 months ago.    No Known Allergies  Current Outpatient Medications  Medication Sig Dispense Refill  . aspirin EC 81 MG tablet Take 81 mg by mouth daily.    [name] atorvastatin (LIPITOR) 40 MG tablet Take 20 mg every evening by mouth.  2  . esomeprazole (NEXIUM 24HR) 20 MG capsule Take 20 mg daily at 12 noon by mouth.    . levothyroxine (SYNTHROID, LEVOTHROID) 50 MCG tablet Take 50 mcg by mouth daily.  0  . naproxen sodium (ALEVE) 220 MG tablet Take 660-880 mg 2 (two) times daily as needed by mouth (for pain/headaches).     . oxyCODONE-acetaminophen (PERCOCET) 7.5-325 MG tablet Take 1 tablet by mouth every 6 (six) hours as needed for severe pain. (Patient not taking: Reported on 09/14/2018) 30 tablet 0   No current  facility-administered medications for this visit.     REVIEW OF SYSTEMS:  [X]  denotes positive finding, [ ]  denotes negative finding Cardiac  Comments:  Chest pain or chest pressure:    Shortness of breath upon exertion: x   Short of breath when lying flat: x   Irregular heart rhythm:        Vascular    Pain in calf, thigh, or hip brought on by ambulation: x   Pain in feet at night that wakes you up from your sleep:  x   Blood clot in your veins:    Leg swelling:  x         PHYSICAL EXAM: Vitals:   09/14/18 1347  BP: 121/63  Pulse: 82  Resp: 20  SpO2: 96%  Weight: 204 lb (92.5 kg)  Height: 5\' 11"  (1.803 m)    GENERAL: The patient is a well-nourished male, in no acute distress. The vital signs are documented above. CARDIOVASCULAR: Plus radial and 2+ femoral pulses bilaterally.  He has a 1+ left posterior tibial pulse.  I do not palpate pulses on the right Abdomen reveals no evidence of hernia with a well-healed midline incision PULMONARY: There is good air exchange  MUSCULOSKELETAL: There are no major deformities or cyanosis. NEUROLOGIC: No focal weakness or paresthesias are detected. SKIN: There are no ulcers or rashes noted. PSYCHIATRIC: The  patient has a normal affect.  DATA:  Noninvasive studies today reveal ankle arm index of 0.68 on the right and 0.93 on the left  MEDICAL ISSUES: Stable overall.  Tolerable claudication related to his superficial femoral artery occlusive disease.  He did undergo ventral hernia incisional hernia repair by Dr. Franky Macho and is done well with this as well.  He will continue his walking program.  Will see [place] again on an as-needed basis.  I did discuss symptoms of graft occlusion and he will notify immediately should this occur    Larina Earthly, MD [facility] Tel [phone] Pager [phone]

## 2018-10-08 IMAGING — DX DG FOOT COMPLETE 3+V*L*
3 series · 3 of 3 positions shown · non-contrast
Comparison: None available

CLINICAL DATA: Left foot wound, pain, swelling and redness.

EXAM:
LEFT FOOT - COMPLETE 3+ VIEW

[foot ap]
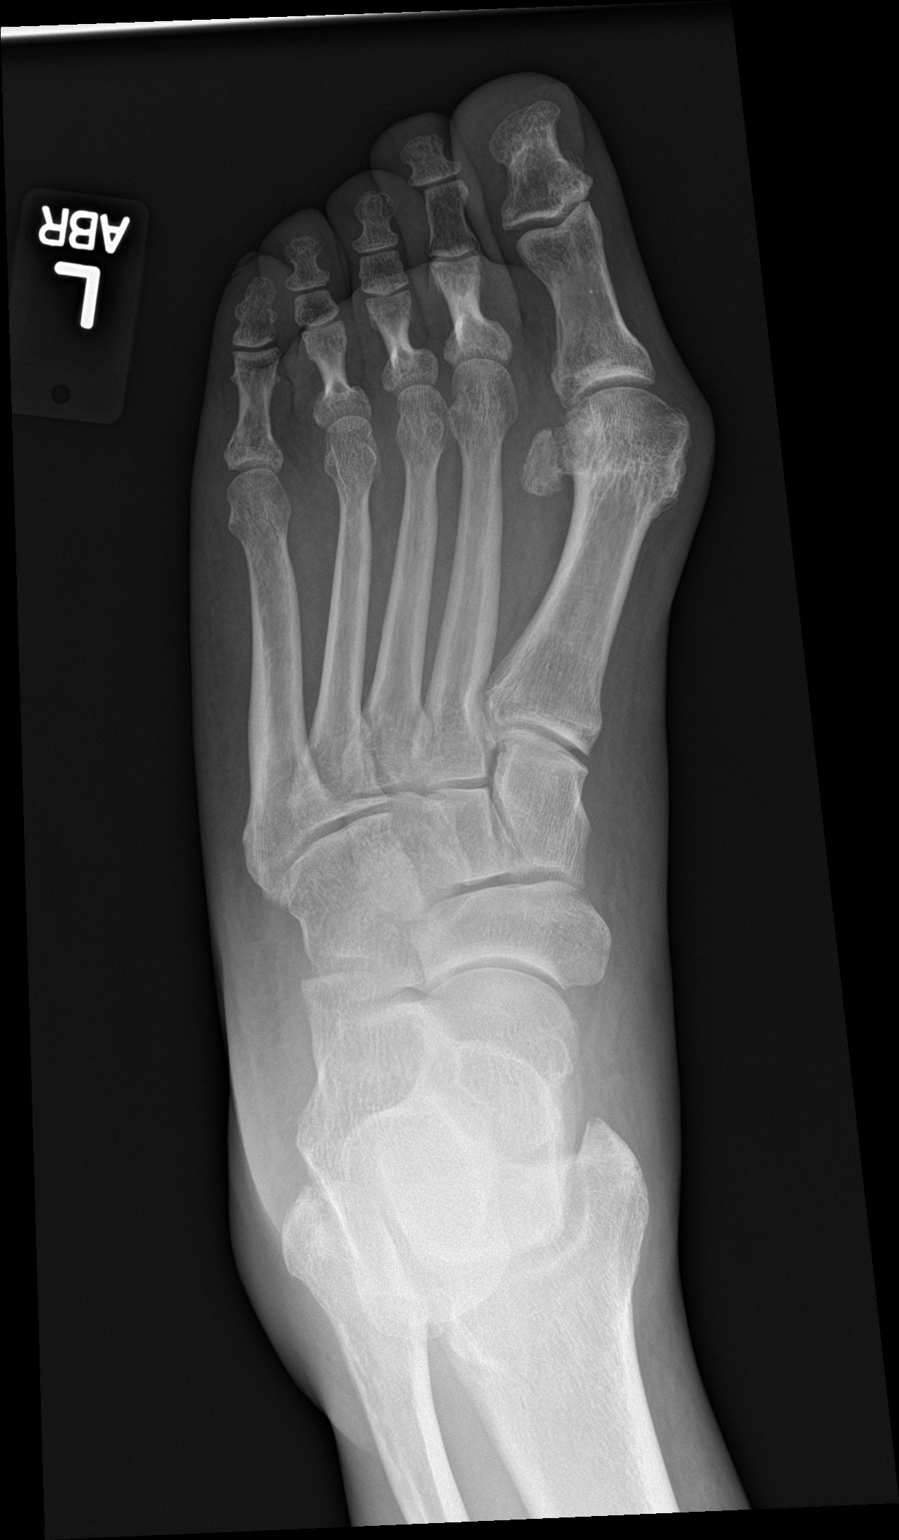

[foot obl]
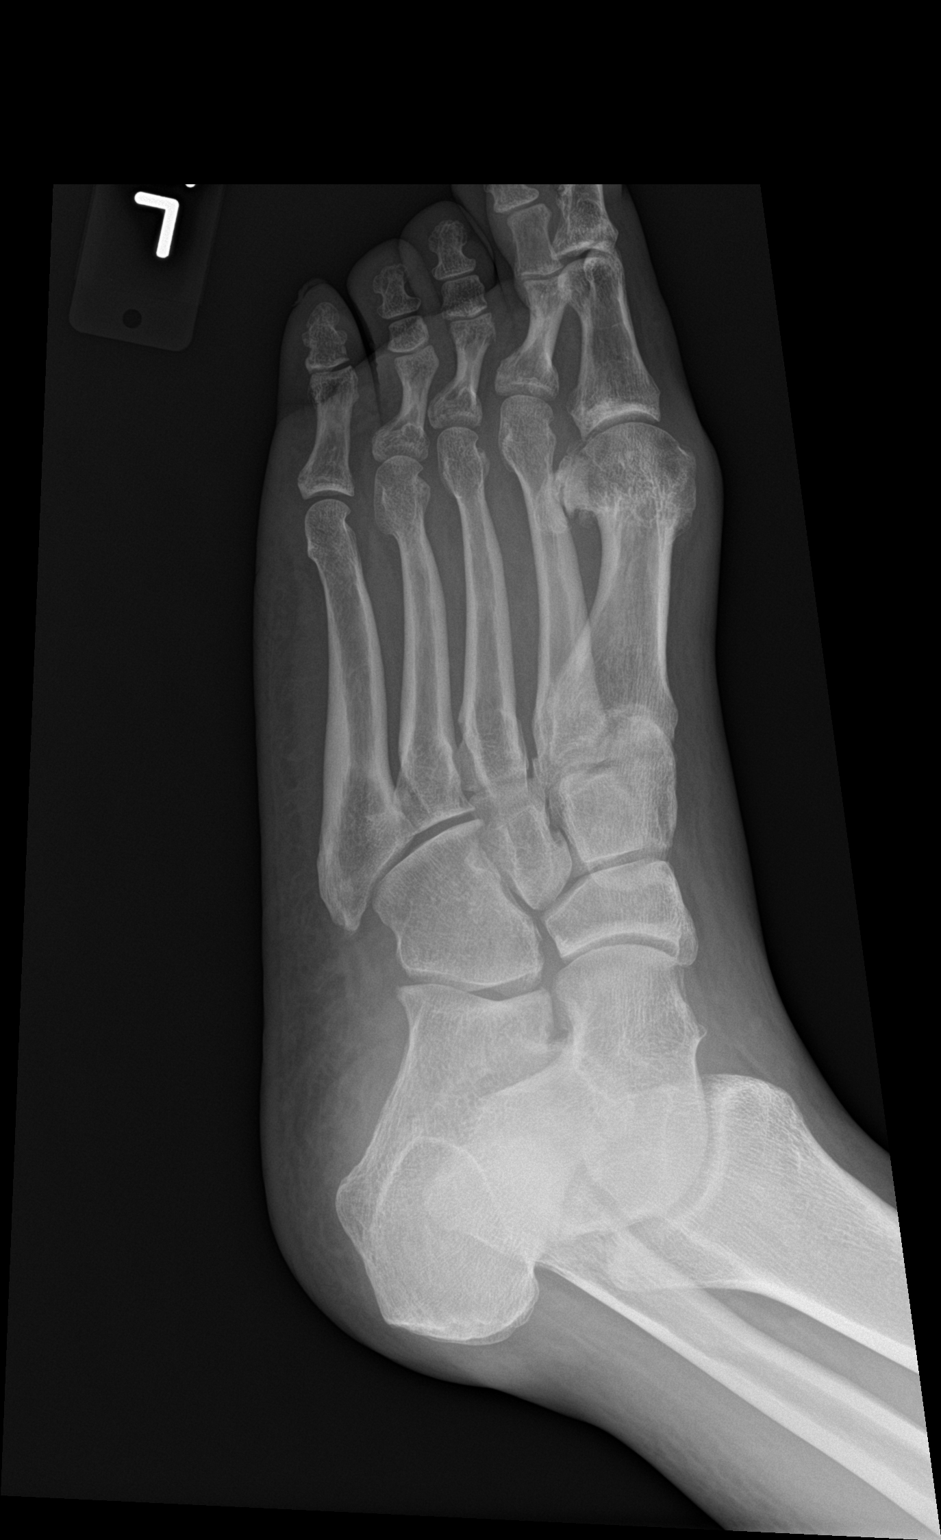

[foot lat]
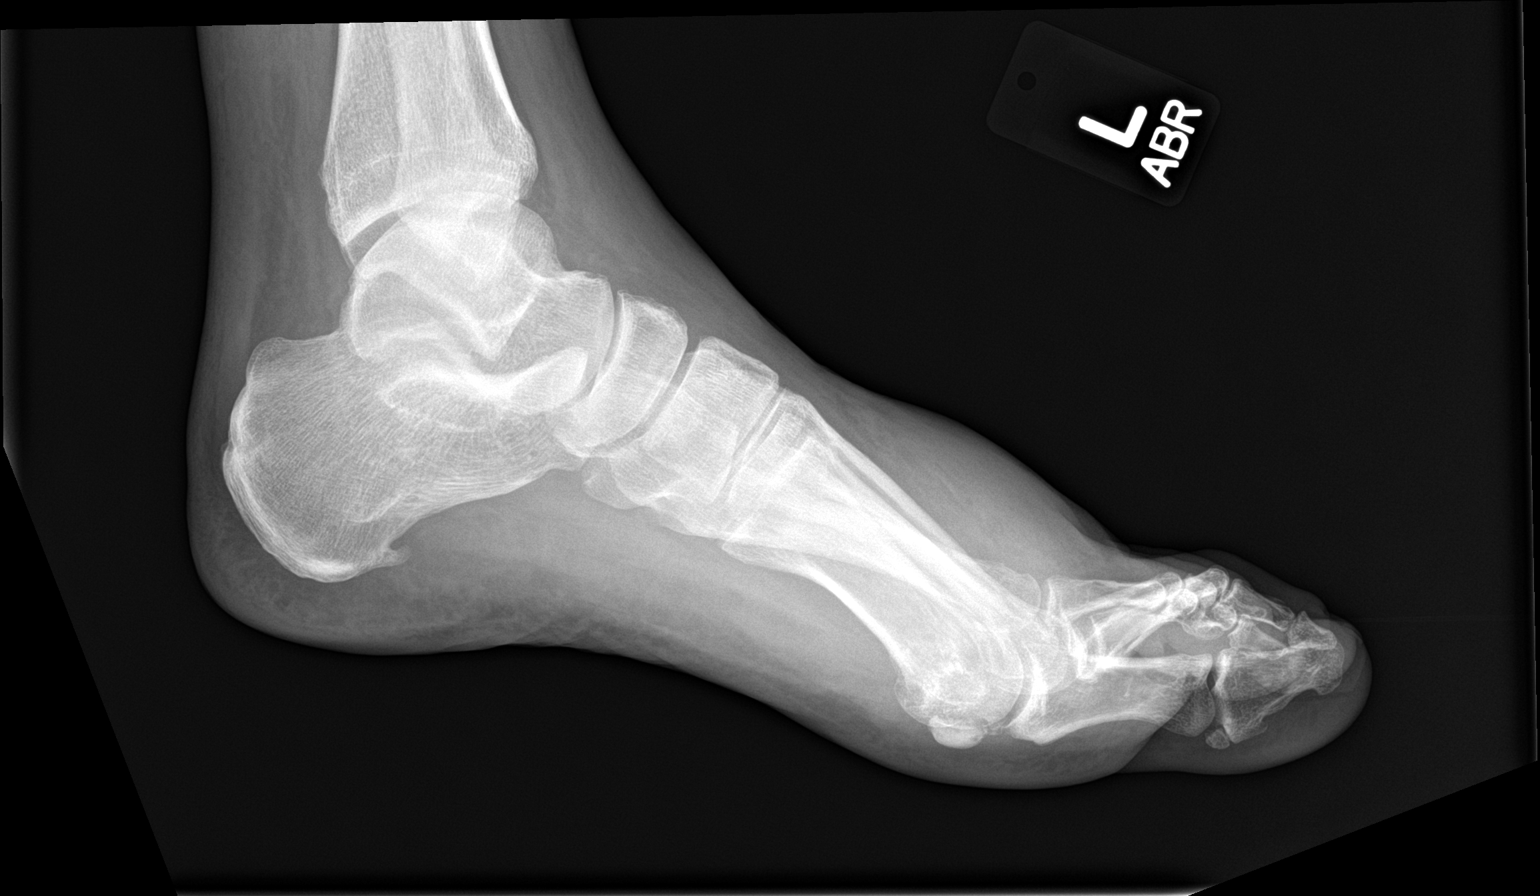

[3 of 3 positions shown; findings below may reference images not displayed]

FINDINGS: Left first MTP joint demonstrates degenerative osteoarthritis with
associated hallux valgus deformity, joint space loss, sclerosis and
bony spurring. No acute osseous finding or fracture. Dorsal foot
soft tissue swelling on the lateral view. No visualized radiopaque
or metallic foreign body. No plain radiographic evidence of osseous
destruction or bone loss.
IMPRESSION: Advanced left first MTP joint degenerative osteoarthritis and
associated hallux valgus deformity

No acute osseous finding

Dorsal foot soft tissue swelling.

## 2018-10-10 IMAGING — CT CT ANGIO AOBIFEM WO/W CM
2 of 9 series · 12 of 46 positions shown, 15 images · IV contrast (Isovue)
Comparison: 01/18/2014

CLINICAL DATA: Bilateral lower extremity nonhealing wounds,
claudication, peripheral vascular disease

EXAM:
CT ANGIOGRAPHY OF ABDOMINAL AORTA WITH ILIOFEMORAL RUNOFF
TECHNIQUE: Multidetector CT imaging of the abdomen, pelvis and lower
extremities was performed using the standard protocol during bolus
administration of intravenous contrast. Multiplanar CT image
reconstructions and MIPs were obtained to evaluate the vascular
anatomy.
CONTRAST:  150 cc Isovue 370

[Series 4: aobifem axial arterial · axial · arterial · 0.96mm/px · z∈[+133,+1384]mm · 11 of 462 slices shown, 13 images]
[im 30/462  soft-tissue]
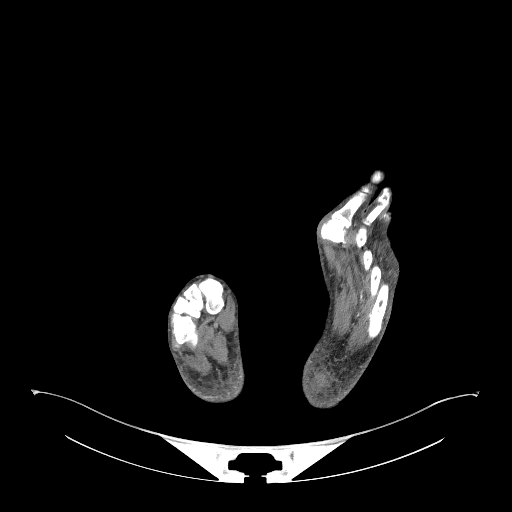
[im 30/462  bone]
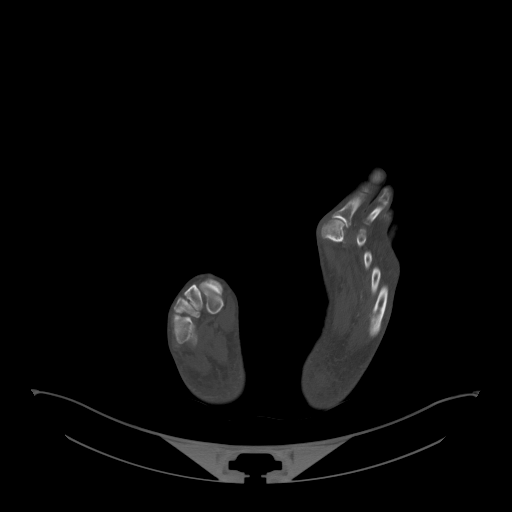
[im 90/462  soft-tissue]
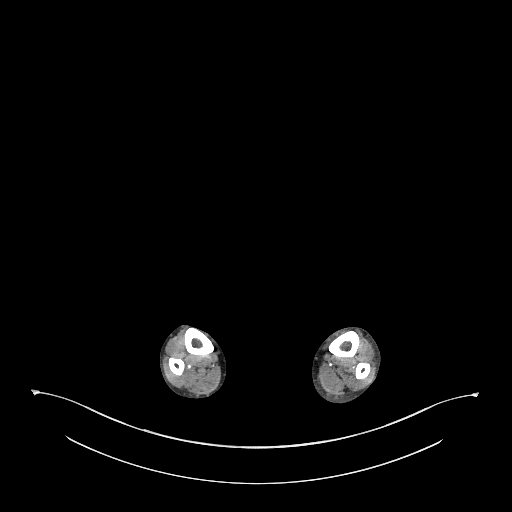
[im 149/462  soft-tissue]
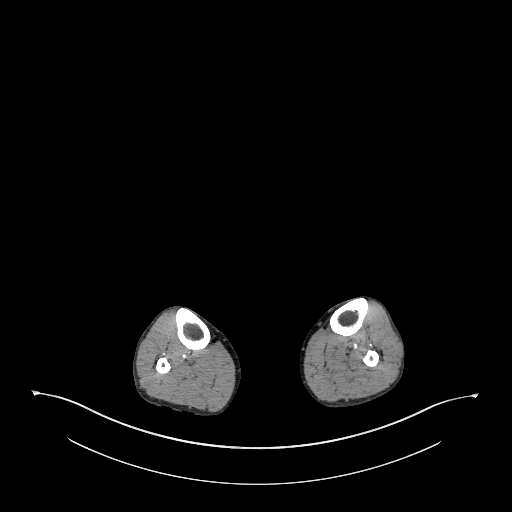
[im 209/462  soft-tissue]
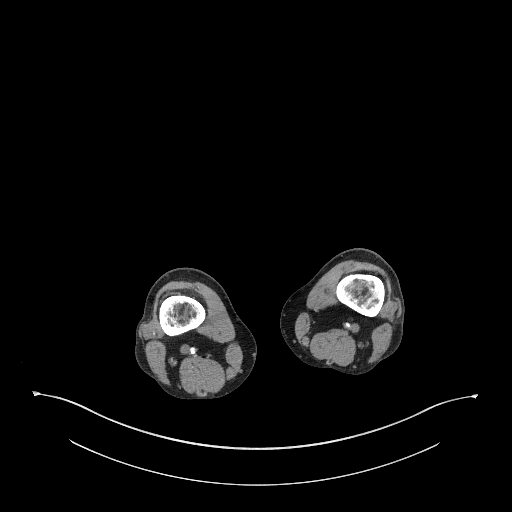
[im 253/462  soft-tissue]
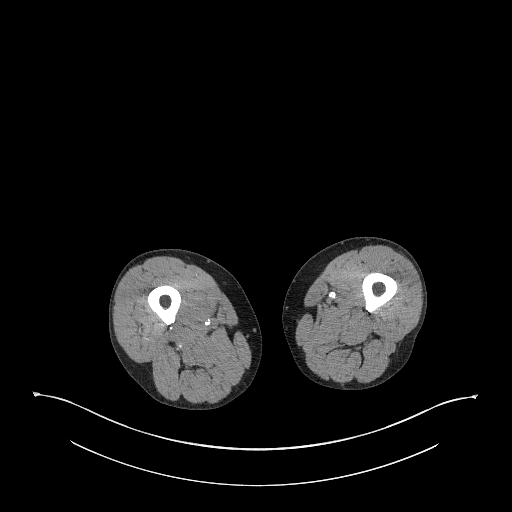
[im 313/462  soft-tissue]
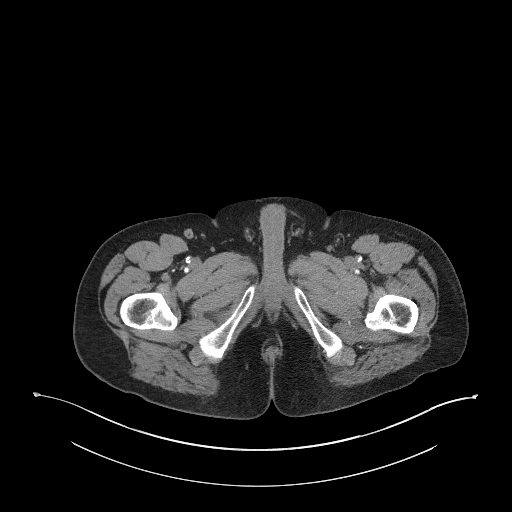
[im 372/462  soft-tissue]
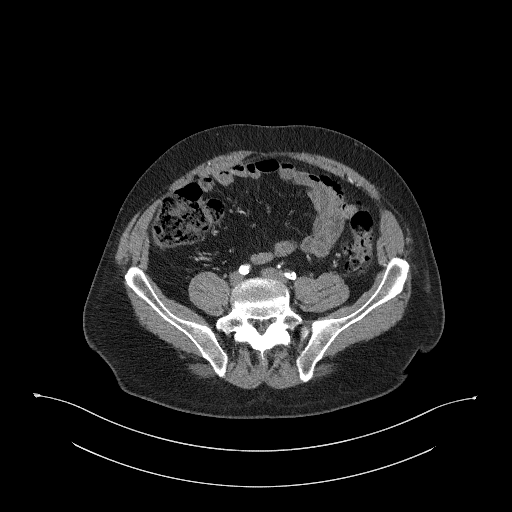
[im 402/462  lung]
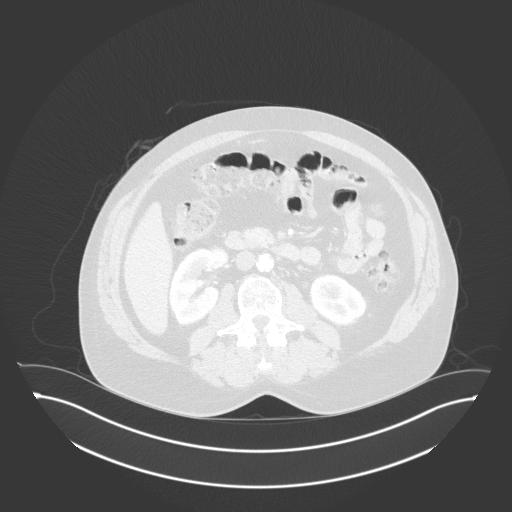
[im 417/462  lung]
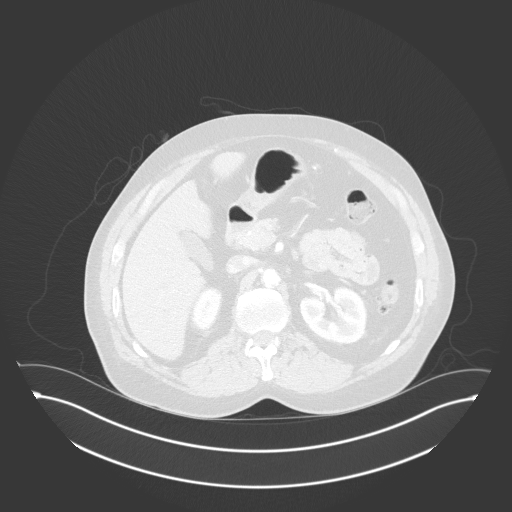
[im 432/462  soft-tissue]
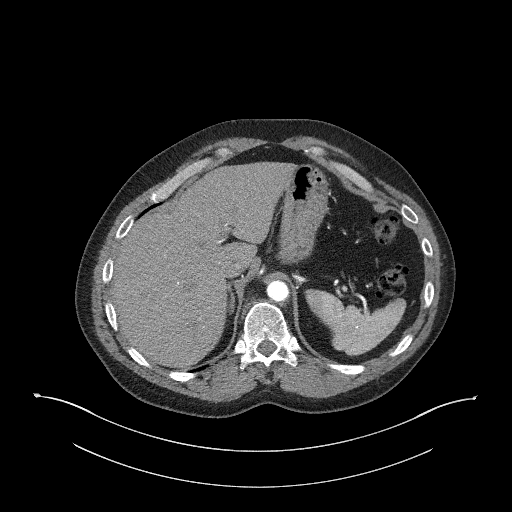
[im 432/462  lung]
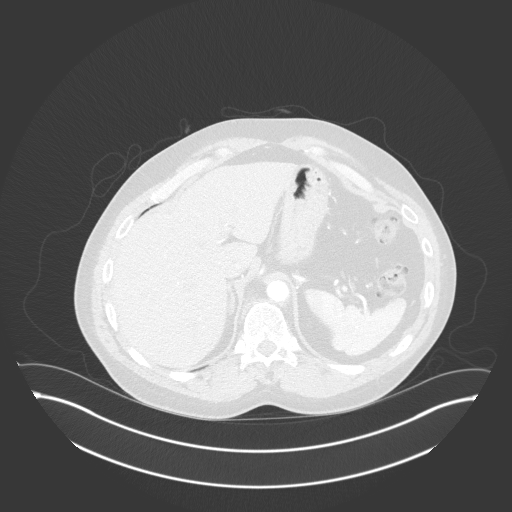
[im 447/462  lung]
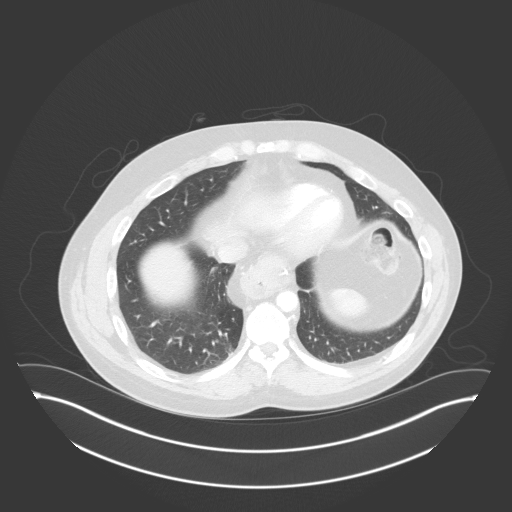

[Series 7: coronal upper · coronal · 0.76mm/px · 1 of 151 slices shown, 2 images]
[im 76/151  soft-tissue]
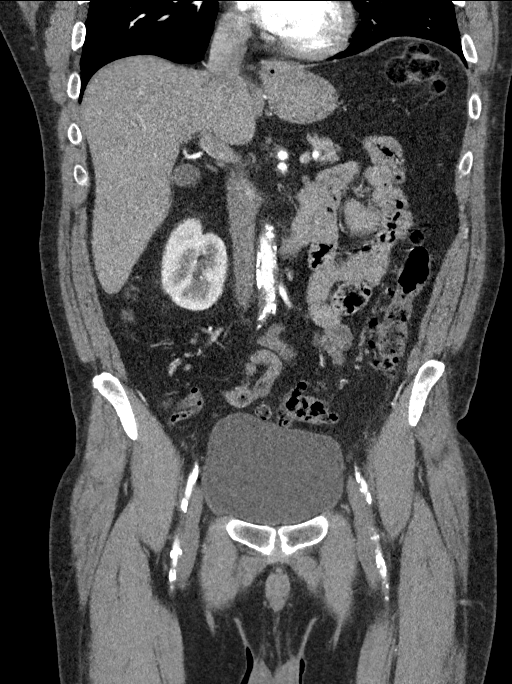
[im 76/151  bone]
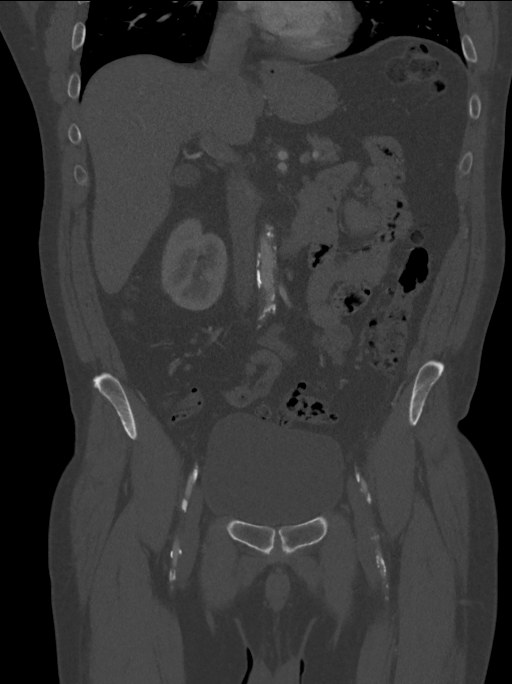

[12 of 46 positions shown; findings below may reference images not displayed]

FINDINGS: VASCULAR

Aorta: Moderate wall thickening and calcific atherosclerosis of the
aorta, most pronounced in the infrarenal segment. Negative for
aneurysm or aortic occlusion. No dissection or retroperitoneal
hemorrhage.

Celiac: Patent origin. Splenic, left gastric, hepatic branches are
also patent.

SMA: Atherosclerotic origin with mild ostial narrowing, less than
50%. SMA trunk remains patent in the mesentery.

Renals: Main renal arteries and accessory right renal artery to the
upper pole all remain patent with mild atherosclerotic origins.

IMA: Atherosclerotic origin but remains patent off of the distal
aorta anteriorly.

RIGHT Lower Extremity

Inflow: Heavily calcified right iliac system. Right common and
external iliac arteries remain patent with mild luminal narrowings
and small caliber but no occlusion. Right internal iliac artery is
chronically occluded.

Outflow: Right common femoral artery is atherosclerotic but patent.
Right profunda femoral artery remains patent. Chronic occlusion of
the right SFA. Right above knee popliteal artery is reconstituted
but very small in caliber with calcific atherosclerosis and luminal
narrowings. Right popliteal artery remains patent across the knee.

Runoff: Dominant runoff vessel to the right lower extremity is the
peroneal artery which reconstitutes

the dorsalis pedis artery peripherally. Anterior tibial and
posterior tibial arteries are very small in caliber and diffusely
diseased.

LEFT Lower Extremity

Inflow: Chronic occlusion of the left common, internal and external
iliac arteries.

Outflow: Left common femoral artery is reconstituted by inferior
epigastric collateral pathways. Left profunda femoral artery remains
patent. Left SFA demonstrates severe disease proximally with
multifocal stenoses and a very small patent lumen throughout the
thigh. However, left SFA appears to remain patent. Popliteal artery
is small in caliber but patent across the left knee.

Runoff: Below the knee, the tibioperoneal trunk is heavily disease.
Dominant left lower extremity runoff is via the left posterior
tibial artery into the foot. Left peroneal and anterior tibial
arteries are diminutive in caliber and heavily disease.

Veins: Venous imaging not performed.

Review of the MIP images confirms the above findings.

NON-VASCULAR

Lower chest: Minor dependent bibasilar atelectasis. Normal heart
size. No pericardial pleural effusion. Very large hiatal hernia.

Hepatobiliary: No focal liver abnormality is seen. No gallstones,
gallbladder wall thickening, or biliary dilatation.

Pancreas: Unremarkable. No pancreatic ductal dilatation or
surrounding inflammatory changes.

Spleen: Spleen is normal in size. Calcified splenic granulomata
evident.

Adrenals/Urinary Tract: Adrenal glands are unremarkable. Kidneys are
normal, without renal calculi, focal lesion, or hydronephrosis.
Bladder is unremarkable.

Stomach/Bowel: Large hiatal hernia again noted. Negative bowel
obstruction, significant dilatation, ileus, or free air. Normal
appendix. Diffuse colonic diverticulosis, most severe in the sigmoid
and rectum. No acute inflammatory process. No free fluid or abscess.

Lymphatic: No adenopathy.

Reproductive: No acute finding. Prostate normal in size. Seminal
vesicles unremarkable.

Other: Small fat containing left inguinal hernia. Small midline fat
containing ventral hernia above the umbilicus. Small fat containing
left umbilical hernia as well.

Musculoskeletal: Minor diffuse thoracolumbar degenerative
spondylosis. Facets are aligned. No compression fracture or acute
osseous finding.
IMPRESSION: VASCULAR

Aortic atherosclerosis without aortic occlusion, dissection or
aneurysm.

Diseased but patent right iliac vasculature. Chronic left common and
external iliac occlusion.

Chronic occlusion of the internal iliac arteries bilaterally

Chronic complete right SFA occlusion. Right above knee popliteal
artery is reconstituted via profunda femoral collaterals. Popliteal
artery is diseased across the knee with preserved single vessel
runoff via the right peroneal artery.

Left common femoral artery is reconstituted through the inferior
epigastric pathways. Left profunda femoral artery remains patent.
Severely diseased diffuse left SFA disease without occlusion but
with multifocal tandem stenoses. Left popliteal artery is diseased
but patent across the knee. Dominant single vessel runoff to the
left lower extremity is the left posterior tibial artery.

NON-VASCULAR

Large hiatal hernia

Splenic granulomata

Colonic diverticulosis

Fat containing ventral hernia, umbilical hernia, and left inguinal
hernia.

## 2023-11-30 ENCOUNTER — Emergency Department (HOSPITAL_COMMUNITY): Payer: Medicaid Other

## 2023-11-30 ENCOUNTER — Inpatient Hospital Stay (HOSPITAL_COMMUNITY)
Admission: EM | Admit: 2023-11-30 | Discharge: 2023-12-11 | DRG: 023 | Disposition: A | Discharge status: Rehabilitation Facility | Payer: Medicaid Other | Attending: Neurology | Admitting: Neurology

## 2023-11-30 ENCOUNTER — Emergency Department (HOSPITAL_COMMUNITY): Payer: Medicaid Other | Admitting: Anesthesiology

## 2023-11-30 ENCOUNTER — Encounter (HOSPITAL_COMMUNITY): Payer: Self-pay | Admitting: *Deleted

## 2023-11-30 ENCOUNTER — Other Ambulatory Visit: Payer: Self-pay

## 2023-11-30 ENCOUNTER — Inpatient Hospital Stay (HOSPITAL_COMMUNITY): Payer: Medicaid Other

## 2023-11-30 ENCOUNTER — Encounter (HOSPITAL_COMMUNITY)
Admission: EM | Disposition: A | Discharge status: Rehabilitation Facility | Payer: Self-pay | Source: Home / Self Care | Attending: Neurology

## 2023-11-30 DIAGNOSIS — F329 Major depressive disorder, single episode, unspecified: Secondary | ICD-10-CM | POA: Diagnosis not present

## 2023-11-30 DIAGNOSIS — I63411 Cerebral infarction due to embolism of right middle cerebral artery: Secondary | ICD-10-CM | POA: Diagnosis present

## 2023-11-30 DIAGNOSIS — J9601 Acute respiratory failure with hypoxia: Secondary | ICD-10-CM | POA: Diagnosis not present

## 2023-11-30 DIAGNOSIS — R1310 Dysphagia, unspecified: Secondary | ICD-10-CM | POA: Diagnosis present

## 2023-11-30 DIAGNOSIS — I6521 Occlusion and stenosis of right carotid artery: Secondary | ICD-10-CM | POA: Diagnosis not present

## 2023-11-30 DIAGNOSIS — Z79890 Hormone replacement therapy (postmenopausal): Secondary | ICD-10-CM

## 2023-11-30 DIAGNOSIS — E785 Hyperlipidemia, unspecified: Secondary | ICD-10-CM

## 2023-11-30 DIAGNOSIS — I69391 Dysphagia following cerebral infarction: Secondary | ICD-10-CM | POA: Diagnosis not present

## 2023-11-30 DIAGNOSIS — J690 Pneumonitis due to inhalation of food and vomit: Secondary | ICD-10-CM | POA: Diagnosis not present

## 2023-11-30 DIAGNOSIS — Z794 Long term (current) use of insulin: Secondary | ICD-10-CM

## 2023-11-30 DIAGNOSIS — R0603 Acute respiratory distress: Secondary | ICD-10-CM | POA: Diagnosis not present

## 2023-11-30 DIAGNOSIS — Z7982 Long term (current) use of aspirin: Secondary | ICD-10-CM

## 2023-11-30 DIAGNOSIS — G8114 Spastic hemiplegia affecting left nondominant side: Secondary | ICD-10-CM | POA: Diagnosis not present

## 2023-11-30 DIAGNOSIS — R4701 Aphasia: Secondary | ICD-10-CM | POA: Diagnosis present

## 2023-11-30 DIAGNOSIS — I639 Cerebral infarction, unspecified: Secondary | ICD-10-CM | POA: Diagnosis not present

## 2023-11-30 DIAGNOSIS — G9341 Metabolic encephalopathy: Secondary | ICD-10-CM

## 2023-11-30 DIAGNOSIS — R41841 Cognitive communication deficit: Secondary | ICD-10-CM | POA: Diagnosis present

## 2023-11-30 DIAGNOSIS — I63511 Cerebral infarction due to unspecified occlusion or stenosis of right middle cerebral artery: Secondary | ICD-10-CM | POA: Diagnosis present

## 2023-11-30 DIAGNOSIS — G935 Compression of brain: Secondary | ICD-10-CM | POA: Diagnosis present

## 2023-11-30 DIAGNOSIS — G8104 Flaccid hemiplegia affecting left nondominant side: Secondary | ICD-10-CM | POA: Diagnosis present

## 2023-11-30 DIAGNOSIS — R569 Unspecified convulsions: Secondary | ICD-10-CM | POA: Diagnosis present

## 2023-11-30 DIAGNOSIS — G9349 Other encephalopathy: Secondary | ICD-10-CM | POA: Diagnosis present

## 2023-11-30 DIAGNOSIS — J441 Chronic obstructive pulmonary disease with (acute) exacerbation: Secondary | ICD-10-CM | POA: Diagnosis present

## 2023-11-30 DIAGNOSIS — I63131 Cerebral infarction due to embolism of right carotid artery: Secondary | ICD-10-CM | POA: Diagnosis present

## 2023-11-30 DIAGNOSIS — E663 Overweight: Secondary | ICD-10-CM | POA: Diagnosis present

## 2023-11-30 DIAGNOSIS — I10 Essential (primary) hypertension: Secondary | ICD-10-CM | POA: Diagnosis present

## 2023-11-30 DIAGNOSIS — E039 Hypothyroidism, unspecified: Secondary | ICD-10-CM | POA: Diagnosis present

## 2023-11-30 DIAGNOSIS — K219 Gastro-esophageal reflux disease without esophagitis: Secondary | ICD-10-CM | POA: Diagnosis present

## 2023-11-30 DIAGNOSIS — Z825 Family history of asthma and other chronic lower respiratory diseases: Secondary | ICD-10-CM

## 2023-11-30 DIAGNOSIS — R739 Hyperglycemia, unspecified: Secondary | ICD-10-CM | POA: Diagnosis not present

## 2023-11-30 DIAGNOSIS — G936 Cerebral edema: Secondary | ICD-10-CM | POA: Diagnosis present

## 2023-11-30 DIAGNOSIS — E1151 Type 2 diabetes mellitus with diabetic peripheral angiopathy without gangrene: Secondary | ICD-10-CM | POA: Diagnosis present

## 2023-11-30 DIAGNOSIS — Z8673 Personal history of transient ischemic attack (TIA), and cerebral infarction without residual deficits: Secondary | ICD-10-CM

## 2023-11-30 DIAGNOSIS — K5901 Slow transit constipation: Secondary | ICD-10-CM | POA: Diagnosis not present

## 2023-11-30 DIAGNOSIS — E871 Hypo-osmolality and hyponatremia: Secondary | ICD-10-CM | POA: Diagnosis present

## 2023-11-30 DIAGNOSIS — T4275XA Adverse effect of unspecified antiepileptic and sedative-hypnotic drugs, initial encounter: Secondary | ICD-10-CM | POA: Diagnosis not present

## 2023-11-30 DIAGNOSIS — R29724 NIHSS score 24: Secondary | ICD-10-CM | POA: Diagnosis present

## 2023-11-30 DIAGNOSIS — E872 Acidosis, unspecified: Secondary | ICD-10-CM | POA: Diagnosis not present

## 2023-11-30 DIAGNOSIS — I69354 Hemiplegia and hemiparesis following cerebral infarction affecting left non-dominant side: Secondary | ICD-10-CM | POA: Diagnosis not present

## 2023-11-30 DIAGNOSIS — E7800 Pure hypercholesterolemia, unspecified: Secondary | ICD-10-CM | POA: Diagnosis present

## 2023-11-30 DIAGNOSIS — Z79899 Other long term (current) drug therapy: Secondary | ICD-10-CM

## 2023-11-30 DIAGNOSIS — R29810 Facial weakness: Secondary | ICD-10-CM | POA: Diagnosis present

## 2023-11-30 DIAGNOSIS — Z7902 Long term (current) use of antithrombotics/antiplatelets: Secondary | ICD-10-CM

## 2023-11-30 DIAGNOSIS — Z6829 Body mass index (BMI) 29.0-29.9, adult: Secondary | ICD-10-CM | POA: Diagnosis not present

## 2023-11-30 DIAGNOSIS — I959 Hypotension, unspecified: Secondary | ICD-10-CM | POA: Diagnosis not present

## 2023-11-30 DIAGNOSIS — R030 Elevated blood-pressure reading, without diagnosis of hypertension: Secondary | ICD-10-CM | POA: Diagnosis not present

## 2023-11-30 HISTORY — PX: RADIOLOGY WITH ANESTHESIA: SHX6223

## 2023-11-30 HISTORY — PX: IR PERCUTANEOUS ART THROMBECTOMY/INFUSION INTRACRANIAL INC DIAG ANGIO: IMG6087

## 2023-11-30 HISTORY — PX: IR CT HEAD LTD: IMG2386

## 2023-11-30 HISTORY — PX: IR INTRAVSC STENT CERV CAROTID W/EMB-PROT MOD SED INCL ANGIO: IMG2303

## 2023-11-30 HISTORY — PX: IR US GUIDE VASC ACCESS RIGHT: IMG2390

## 2023-11-30 LAB — COMPREHENSIVE METABOLIC PANEL
ALT: 23 U/L (ref 0–44)
AST: 24 U/L (ref 15–41)
Albumin: 3.6 g/dL (ref 3.5–5.0)
Alkaline Phosphatase: 59 U/L (ref 38–126)
Anion gap: 6 (ref 5–15)
BUN: 14 mg/dL (ref 8–23)
CO2: 24 mmol/L (ref 22–32)
Calcium: 9 mg/dL (ref 8.9–10.3)
Chloride: 105 mmol/L (ref 98–111)
Creatinine, Ser: 1.08 mg/dL (ref 0.61–1.24)
GFR, Estimated: >60 mL/min (ref >60)
Glucose, Bld: 108 mg/dL — ABNORMAL HIGH (ref 70–99)
Potassium: 3.7 mmol/L (ref 3.5–5.1)
Sodium: 135 mmol/L (ref 135–145)
Total Bilirubin: 0.8 mg/dL (ref 0.0–1.2)
Total Protein: 7.4 g/dL (ref 6.5–8.1)

## 2023-11-30 LAB — PROCALCITONIN: Procalcitonin: <0.1 ng/mL

## 2023-11-30 LAB — POCT I-STAT, CHEM 8
BUN: 14 mg/dL (ref 8–23)
Calcium, Ion: 1.17 mmol/L (ref 1.15–1.40)
Chloride: 103 mmol/L (ref 98–111)
Creatinine, Ser: 1.2 mg/dL (ref 0.61–1.24)
Glucose, Bld: 106 mg/dL — ABNORMAL HIGH (ref 70–99)
HCT: 44 % (ref 39.0–52.0)
Hemoglobin: 15 g/dL (ref 13.0–17.0)
Potassium: 3.8 mmol/L (ref 3.5–5.1)
Sodium: 139 mmol/L (ref 135–145)
TCO2: 23 mmol/L (ref 22–32)

## 2023-11-30 LAB — APTT: aPTT: 22 s — ABNORMAL LOW (ref 24–36)

## 2023-11-30 LAB — DIFFERENTIAL
Abs Immature Granulocytes: 0.05 10*3/uL (ref 0.00–0.07)
Basophils Absolute: 0.1 10*3/uL (ref 0.0–0.1)
Basophils Relative: 1 %
Eosinophils Absolute: 0.2 10*3/uL (ref 0.0–0.5)
Eosinophils Relative: 2 %
Immature Granulocytes: 1 %
Lymphocytes Relative: 27 %
Lymphs Abs: 2.2 10*3/uL (ref 0.7–4.0)
Monocytes Absolute: 1.1 10*3/uL — ABNORMAL HIGH (ref 0.1–1.0)
Monocytes Relative: 14 %
Neutro Abs: 4.5 10*3/uL (ref 1.7–7.7)
Neutrophils Relative %: 55 %

## 2023-11-30 LAB — SARS CORONAVIRUS 2 BY RT PCR: SARS Coronavirus 2 by RT PCR: NEGATIVE

## 2023-11-30 LAB — CBC
HCT: 46.8 % (ref 39.0–52.0)
Hemoglobin: 16.4 g/dL (ref 13.0–17.0)
MCH: 30.4 pg (ref 26.0–34.0)
MCHC: 35 g/dL (ref 30.0–36.0)
MCV: 86.8 fL (ref 80.0–100.0)
Platelets: 222 10*3/uL (ref 150–400)
RBC: 5.39 MIL/uL (ref 4.22–5.81)
RDW: 12.8 % (ref 11.5–15.5)
WBC: 8 10*3/uL (ref 4.0–10.5)
nRBC: 0 % (ref 0.0–0.2)

## 2023-11-30 LAB — ETHANOL: Alcohol, Ethyl (B): <10 mg/dL (ref <10)

## 2023-11-30 LAB — PROTIME-INR
INR: 1.1 (ref 0.8–1.2)
Prothrombin Time: 14.3 s (ref 11.4–15.2)

## 2023-11-30 LAB — HIV ANTIBODY (ROUTINE TESTING W REFLEX): HIV Screen 4th Generation wRfx: NONREACTIVE

## 2023-11-30 LAB — MRSA NEXT GEN BY PCR, NASAL: MRSA by PCR Next Gen: NOT DETECTED

## 2023-11-30 SURGERY — IR WITH ANESTHESIA
Anesthesia: General

## 2023-11-30 MED ORDER — LEVOTHYROXINE SODIUM 50 MCG PO TABS: 50.0000 ug | ORAL_TABLET | Freq: Every day | ORAL | Status: DC

## 2023-11-30 MED ORDER — SODIUM CHLORIDE 0.9 % IV SOLN
2.0000 ug/kg/min | INTRAVENOUS | Status: DC
  Administered 2023-11-30 – 2023-12-01 (×3): 2 ug/kg/min via INTRAVENOUS
  Filled 2023-11-30 (×4): qty 50

## 2023-11-30 MED ORDER — PROPOFOL 1000 MG/100ML IV EMUL
0.0000 ug/kg/min | INTRAVENOUS | Status: DC
  Administered 2023-11-30: 50 ug/kg/min via INTRAVENOUS
  Administered 2023-11-30: 40 ug/kg/min via INTRAVENOUS
  Administered 2023-12-01: 20 ug/kg/min via INTRAVENOUS
  Filled 2023-11-30: qty 100

## 2023-11-30 MED ORDER — VERAPAMIL HCL 2.5 MG/ML IV SOLN
INTRAVENOUS | Status: AC
Start: 2023-11-30 — End: ?
  Filled 2023-11-30: qty 2

## 2023-11-30 MED ORDER — CANGRELOR TETRASODIUM 50 MG IV SOLR
INTRAVENOUS | Status: AC
Start: 2023-11-30 — End: ?
  Filled 2023-11-30: qty 50

## 2023-11-30 MED ORDER — STROKE: EARLY STAGES OF RECOVERY BOOK
Freq: Once | Status: AC
  Filled 2023-11-30: qty 1

## 2023-11-30 MED ORDER — CHLORHEXIDINE GLUCONATE CLOTH 2 % EX PADS
6.0000 | MEDICATED_PAD | Freq: Every day | CUTANEOUS | Status: DC
  Administered 2023-11-30 – 2023-12-09 (×8): 6 via TOPICAL

## 2023-11-30 MED ORDER — SUCCINYLCHOLINE CHLORIDE 200 MG/10ML IV SOSY
PREFILLED_SYRINGE | INTRAVENOUS | Status: DC | PRN
  Administered 2023-11-30: 200 mg via INTRAVENOUS

## 2023-11-30 MED ORDER — POLYETHYLENE GLYCOL 3350 17 G PO PACK: 17.0000 g | PACK | Freq: Every day | ORAL | Status: DC

## 2023-11-30 MED ORDER — PHENYLEPHRINE HCL-NACL 20-0.9 MG/250ML-% IV SOLN
INTRAVENOUS | Status: DC | PRN
  Administered 2023-11-30: 50 ug/min via INTRAVENOUS

## 2023-11-30 MED ORDER — PANTOPRAZOLE SODIUM 40 MG IV SOLR
40.0000 mg | Freq: Every day | INTRAVENOUS | Status: DC
  Administered 2023-12-01 – 2023-12-03 (×4): 40 mg via INTRAVENOUS
  Filled 2023-11-30 (×4): qty 10

## 2023-11-30 MED ORDER — NITROGLYCERIN 1 MG/10 ML FOR IR/CATH LAB
INTRA_ARTERIAL | Status: AC
Start: 2023-11-30 — End: ?
  Filled 2023-11-30: qty 10

## 2023-11-30 MED ORDER — ACETAMINOPHEN 325 MG PO TABS
650.0000 mg | ORAL_TABLET | ORAL | Status: DC | PRN
  Administered 2023-12-02 – 2023-12-09 (×5): 650 mg via ORAL
  Filled 2023-11-30 (×5): qty 2

## 2023-11-30 MED ORDER — SENNOSIDES-DOCUSATE SODIUM 8.6-50 MG PO TABS: 1.0000 | ORAL_TABLET | Freq: Every evening | ORAL | Status: DC | PRN

## 2023-11-30 MED ORDER — FENTANYL CITRATE PF 50 MCG/ML IJ SOSY: 50.0000 ug | PREFILLED_SYRINGE | Freq: Once | INTRAMUSCULAR | Status: DC

## 2023-11-30 MED ORDER — ROCURONIUM BROMIDE 10 MG/ML (PF) SYRINGE
PREFILLED_SYRINGE | INTRAVENOUS | Status: DC | PRN
  Administered 2023-11-30 (×2): 30 mg via INTRAVENOUS

## 2023-11-30 MED ORDER — ACETAMINOPHEN 650 MG RE SUPP: 650.0000 mg | RECTAL | Status: DC | PRN

## 2023-11-30 MED ORDER — CLEVIDIPINE BUTYRATE 0.5 MG/ML IV EMUL: 0.0000 mg/h | INTRAVENOUS | Status: DC

## 2023-11-30 MED ORDER — LEVOTHYROXINE SODIUM 25 MCG PO TABS: 50.0000 ug | ORAL_TABLET | Freq: Every day | ORAL | Status: DC

## 2023-11-30 MED ORDER — CANGRELOR BOLUS VIA INFUSION
INTRAVENOUS | Status: AC | PRN
  Administered 2023-11-30: 1452 ug via INTRAVENOUS

## 2023-11-30 MED ORDER — SODIUM CHLORIDE 0.9 % IV SOLN: INTRAVENOUS | Status: AC

## 2023-11-30 MED ORDER — IOHEXOL 350 MG/ML SOLN
75.0000 mL | Freq: Once | INTRAVENOUS | Status: AC | PRN
  Administered 2023-11-30: 75 mL via INTRAVENOUS

## 2023-11-30 MED ORDER — ACETAMINOPHEN 160 MG/5ML PO SOLN
650.0000 mg | ORAL | Status: DC | PRN
  Administered 2023-12-05: 650 mg
  Filled 2023-11-30: qty 20.3

## 2023-11-30 MED ORDER — SODIUM CHLORIDE 0.9 % IV BOLUS: 250.0000 mL | INTRAVENOUS | Status: AC | PRN

## 2023-11-30 MED ORDER — SODIUM CHLORIDE 0.9 % IV SOLN: 2.0000 ug/kg/min | INTRAVENOUS | Status: DC

## 2023-11-30 MED ORDER — FENTANYL 2500MCG IN NS 250ML (10MCG/ML) PREMIX INFUSION
50.0000 ug/h | INTRAVENOUS | Status: DC
  Administered 2023-11-30: 50 ug/h via INTRAVENOUS
  Filled 2023-11-30: qty 250

## 2023-11-30 MED ORDER — EPHEDRINE SULFATE-NACL 50-0.9 MG/10ML-% IV SOSY
PREFILLED_SYRINGE | INTRAVENOUS | Status: DC | PRN
  Administered 2023-11-30 (×3): 5 mg via INTRAVENOUS

## 2023-11-30 MED ORDER — SODIUM CHLORIDE 0.9 % IV SOLN: 250.0000 mL | INTRAVENOUS | Status: DC

## 2023-11-30 MED ORDER — TENECTEPLASE FOR STROKE
PACK | INTRAVENOUS | Status: AC
  Administered 2023-11-30: 24 mg via INTRAVENOUS
  Filled 2023-11-30: qty 10

## 2023-11-30 MED ORDER — VERAPAMIL HCL 2.5 MG/ML IV SOLN
INTRAVENOUS | Status: AC | PRN
  Administered 2023-11-30: 5 mg via INTRA_ARTERIAL

## 2023-11-30 MED ORDER — PHENYLEPHRINE HCL-NACL 20-0.9 MG/250ML-% IV SOLN
25.0000 ug/min | INTRAVENOUS | Status: AC
  Administered 2023-11-30: 25 ug/min via INTRAVENOUS
  Administered 2023-12-01: 45 ug/min via INTRAVENOUS
  Administered 2023-12-01: 50 ug/min via INTRAVENOUS
  Administered 2023-12-01: 25 ug/min via INTRAVENOUS
  Administered 2023-12-02: 35 ug/min via INTRAVENOUS
  Administered 2023-12-02: 50 ug/min via INTRAVENOUS
  Administered 2023-12-02: 60 ug/min via INTRAVENOUS
  Filled 2023-11-30 (×6): qty 250

## 2023-11-30 MED ORDER — SODIUM CHLORIDE 0.9 % IV SOLN
INTRAVENOUS | Status: AC | PRN
  Administered 2023-11-30: 2 ug/kg/min via INTRAVENOUS

## 2023-11-30 MED ORDER — TENECTEPLASE FOR STROKE: 0.2500 mg/kg | PACK | Freq: Once | INTRAVENOUS | Status: AC

## 2023-11-30 MED ORDER — CANGRELOR TETRASODIUM 50 MG IV SOLR
INTRAVENOUS | Status: AC
  Filled 2023-11-30: qty 50

## 2023-11-30 MED ORDER — LACTATED RINGERS IV BOLUS
500.0000 mL | Freq: Once | INTRAVENOUS | Status: AC
  Administered 2023-11-30: 500 mL via INTRAVENOUS

## 2023-11-30 MED ORDER — ORAL CARE MOUTH RINSE: 15.0000 mL | OROMUCOSAL | Status: DC | PRN

## 2023-11-30 MED ORDER — GLYCOPYRROLATE PF 0.2 MG/ML IJ SOSY
PREFILLED_SYRINGE | INTRAMUSCULAR | Status: DC | PRN
  Administered 2023-11-30: .2 mg via INTRAVENOUS

## 2023-11-30 MED ORDER — DOCUSATE SODIUM 50 MG/5ML PO LIQD
100.0000 mg | Freq: Two times a day (BID) | ORAL | Status: DC
  Administered 2023-12-01: 100 mg
  Filled 2023-11-30: qty 10

## 2023-11-30 MED ORDER — FENTANYL BOLUS VIA INFUSION
50.0000 ug | INTRAVENOUS | Status: DC | PRN
  Administered 2023-11-30: 100 ug via INTRAVENOUS

## 2023-11-30 MED ORDER — ATORVASTATIN CALCIUM 10 MG PO TABS: 20.0000 mg | ORAL_TABLET | Freq: Every evening | ORAL | Status: DC

## 2023-11-30 MED ORDER — LACTATED RINGERS IV SOLN: INTRAVENOUS | Status: DC | PRN

## 2023-11-30 MED ORDER — SODIUM CHLORIDE 0.9 % IV SOLN
3.0000 g | Freq: Four times a day (QID) | INTRAVENOUS | Status: AC
  Administered 2023-12-01 – 2023-12-05 (×18): 3 g via INTRAVENOUS
  Filled 2023-11-30 (×18): qty 8

## 2023-11-30 MED ORDER — PROPOFOL 10 MG/ML IV BOLUS
INTRAVENOUS | Status: DC | PRN
  Administered 2023-11-30: 150 mg via INTRAVENOUS

## 2023-11-30 MED ORDER — ONDANSETRON HCL 4 MG/2ML IJ SOLN
4.0000 mg | Freq: Four times a day (QID) | INTRAMUSCULAR | Status: DC | PRN
  Administered 2023-12-07 – 2023-12-08 (×3): 4 mg via INTRAVENOUS
  Filled 2023-11-30 (×4): qty 2

## 2023-11-30 NOTE — Progress Notes (Signed)
Called Dr. Selina Cooley to ask if I could bring her the consent for Mechanical Thrombectomy to sign. She is at [facility] and stated that she addressed consent in her note and that that should be sufficient.

## 2023-11-30 NOTE — Anesthesia Preprocedure Evaluation (Addendum)
Anesthesia Evaluation  Patient identified by MRN, date of birth, ID band Patient confused    Reviewed: Allergy & Precautions, H&P , NPO status , Patient's Chart, lab work & pertinent test results  Airway Mallampati: Unable to assess       Dental no notable dental hx. (+) Teeth Intact, Dental Advisory Given   Pulmonary former smoker   Pulmonary exam normal breath sounds clear to auscultation       Cardiovascular negative cardio ROS  Rhythm:Regular Rate:Normal     Neuro/Psych Seizures -,  CVA, Residual Symptoms  negative psych ROS   GI/Hepatic Neg liver ROS,GERD  Medicated,,  Endo/Other  Hypothyroidism    Renal/GU negative Renal ROS  negative genitourinary   Musculoskeletal   Abdominal   Peds  Hematology negative hematology ROS (+)   Anesthesia Other Findings   Reproductive/Obstetrics negative OB ROS                             Anesthesia Physical Anesthesia Plan  ASA: 3 and emergent  Anesthesia Plan: General   Post-op Pain Management: Minimal or no pain anticipated   Induction: Intravenous, Rapid sequence and Cricoid pressure planned  PONV Risk Score and Plan: 3 and Ondansetron, Dexamethasone and Treatment may vary due to age or medical condition  Airway Management Planned: Oral ETT  Additional Equipment: Arterial line  Intra-op Plan:   Post-operative Plan: Extubation in OR  Informed Consent: I have reviewed the patients History and Physical, chart, labs and discussed the procedure including the risks, benefits and alternatives for the proposed anesthesia with the patient or authorized representative who has indicated his/her understanding and acceptance.     Dental advisory given  Plan Discussed with: CRNA and Anesthesiologist  Anesthesia Plan Comments:        Anesthesia Quick Evaluation

## 2023-11-30 NOTE — ED Triage Notes (Signed)
Pt arrived to er by [facility] after being found by family with left side weakness and right eye deviation, according to family they went to lunch and came back to find pt with left side weakness and eye deviation, time last known well is 12:30

## 2023-11-30 NOTE — Transfer of Care (Signed)
Immediate Anesthesia Transfer of Care Note  Patient: Ian Wong  Procedure(s) Performed: IR WITH ANESTHESIA  Patient Location: PACU  Anesthesia Type:General  Level of Consciousness: Patient remains intubated per anesthesia plan  Airway & Oxygen Therapy: Patient remains intubated per anesthesia plan and Patient placed on Ventilator (see vital sign flow sheet for setting)  Post-op Assessment: Report given to RN and Post -op Vital signs reviewed and stable  Post vital signs: Reviewed and stable  Last Vitals:  Vitals Value Taken Time  BP 121/57 11/30/23 1745  Temp    Pulse 65 11/30/23 1747  Resp 16 11/30/23 1747  SpO2 100 % 11/30/23 1747  Vitals shown include unfiled device data.  Last Pain:  Vitals:   11/30/23 1504  TempSrc:   PainSc: 5          Complications: No notable events documented.

## 2023-11-30 NOTE — Procedures (Signed)
INTERVENTIONAL NEURORADIOLOGY BRIEF POSTPROCEDURE NOTE  DIAGNOSTIC CEREBRAL ANGIOGRAM, MECHANICAL THROMBECTOMY, FLAT PANEL HEAD CT, RIGHT CAROTID STENTING AND ANGIOPLASTY WITH CEREBRAL PROTECTION DEVICE  Attending physician: Baldemar Lenis, MD  Diagnosis: Right ICA occlusion cervical, intracranial  Access site: Right common femoral artery.  Access closure: 44F angioseal.  Anesthesia: IR sedation: General endotracheal anesthesia.  Medication used: cangrelor IV bolus and continuous drip.  Complications: None.  Estimated blood loss:  80 mL.  Specimen: None.  Findings: Interval recanalization of the cervical right ICA with underlying severe stenosis. Persistent occlusion of the right ICA at the terminus. Mechanical thrombectomy performed with direct contact aspiration achieving complete recanalization (TICI 3). Flat panel CT showed no hemorrhagic complicaiton. Following, cervical angioplasty and stenting was performed with cerebral protection device with no residual stenosis. Follow-up angiograms showed no thromboembolic complication.  The patient tolerated the procedure well and was transferred to PACU in stable condition.   PLAN: - Patient remained intubated due to vomiting after intubation. - Bed rest x 6 hours post femoral puncture. - Transition from cangrelor to Brilinta 180 mg (x1) and ASA 81 mg once patient able to hold oral intake. Until then, cangrelor drip must be maintained unless otherwise discontinued by attending physician. - SBP 120-160 mmHg

## 2023-11-30 NOTE — Progress Notes (Signed)
Arrived to patient room. Unit nurse at bedside currently attempt placement. Notified to place consult if VAST needed. VU. Tomasita Morrow, RN VAST

## 2023-11-30 NOTE — H&P (Addendum)
NEUROLOGY CONSULT NOTE   Date of service: November 30, 2023 Patient Name: Ian Wong MRN:  [id number] DOB:  [birth year] Chief Complaint: "Left-sided weakness and right gaze deviation.   History of Present Illness  Ian Wong is a 62 y.o. male with history of GERD, hypothyroidism, seizures who presented to [facility] ED with persistent right gaze deviation and left hemiplegia.  Last known well was 12:45 PM.  Family called EMS when his symptoms started.  They noted that he was weak on the left and his speech was slurring.  CT head without contrast with aspects of 10 and no large territory infarct or intracranial hemorrhage.  CTA demonstrated occlusion of the right ICA with distal intracranial reconstitution and then occlusion of the right MCA.  He was given TNKase and was transferred to [facility] for emergent thrombectomy.    I spoke with his wife Ms. Calixto Vasilopoulos who confirmed that she had consented for both TNKase administration and and for thrombectomy.  I evaluated Mr. Faure at the ED bridge upon arrival to [facility].  He still has persistent left-sided weakness and right gaze with left-sided sensory deficit, left hemianopsia and left sensory neglect.   LKW: 1245PM Modified rankin score: 0-Completely asymptomatic and back to baseline post- stroke IV Thrombolysis: given tnkase at [facility] EVT: yes, taken to IR for thrombectomy.  NIHSS components Score: Comment  1a Level of Conscious 0[x]  1[]  2[]  3[]      1b LOC Questions 0[x]  1[]  2[]       1c LOC Commands 0[x]  1[]  2[]       2 Best Gaze 0[]  1[]  2[x]       3 Visual 0[]  1[]  2[x]  3[]      4 Facial Palsy 0[]  1[]  2[x]  3[]      5a Motor Arm - left 0[]  1[]  2[]  3[]  4[x]  UN[]    5b Motor Arm - Right 0[x]  1[]  2[]  3[]  4[]  UN[]    6a Motor Leg - Left 0[]  1[]  2[]  3[]  4[x]  UN[]    6b Motor Leg - Right 0[x]  1[]  2[]  3[]  4[]  UN[]    7 Limb Ataxia 0[x]  1[]  2[]  3[]  UN[]     8 Sensory 0[]  1[]  2[x]  UN[]      9  Best Language 0[x]  1[]  2[]  3[]      10 Dysarthria 0[]  1[]  2[x]  UN[]      11 Extinct. and Inattention 0[x]  1[]  2[]       TOTAL: 18      ROS  Unable to ascertain due to acuity of the situation.  Past History   Past Medical History:  Diagnosis Date   GERD (gastroesophageal reflux disease)    Hypothyroidism    Seizures (HCC)     Past Surgical History:  Procedure Laterality Date   AORTA - BILATERAL FEMORAL ARTERY BYPASS GRAFT Bilateral 01/07/2017   Procedure: AORTOBIFEMORAL BYPASS GRAFT;  Surgeon: Larina Earthly, MD;  Location: [facility] OR;  Service: Vascular;  Laterality: Bilateral;   I & D EXTREMITY Left 11/28/2014   Procedure: IRRIGATION AND DEBRIDEMENT;  REPAIR OF CRUSH INJURY;  Surgeon: Betha Loa, MD;  Location: [facility] OR;  Service: Orthopedics;  Laterality: Left;   INCISIONAL HERNIA REPAIR N/A 10/26/2017   Procedure: HERNIA REPAIR INCISIONAL WITH MESH;  Surgeon: Franky Macho, MD;  Location: [facility] ORS;  Service: General;  Laterality: N/A;    Family History: Family History  Problem Relation Age of Onset   COPD Mother    Other Brother     Social History  reports that he  quit smoking about 7 years ago. His smoking use included cigarettes. He has never used smokeless tobacco. He reports that he does not currently use alcohol. He reports that he does not use drugs.  No Known Allergies  Medications   Current Facility-Administered Medications:    [START ON 12/01/2023]  stroke: early stages of recovery book, , Does not apply, Once, Erick Blinks, MD   0.9 %  sodium chloride infusion, , Intravenous, Continuous, Erick Blinks, MD   acetaminophen (TYLENOL) tablet 650 mg, 650 mg, Oral, Q4H PRN **OR** acetaminophen (TYLENOL) 160 MG/5ML solution 650 mg, 650 mg, Per Tube, Q4H PRN **OR** acetaminophen (TYLENOL) suppository 650 mg, 650 mg, Rectal, Q4H PRN, Erick Blinks, MD   cangrelor [facility]) 50,000 mcg in sodium chloride 0.9 % 250 mL (200 mcg/mL) infusion, , , Continuous PRN, de  Melchor Amour, [place], MD, Last Rate: 58.1 mL/hr at 11/30/23 1638, 2 mcg/kg/min at 11/30/23 1638   cangrelor [facility]) bolus via infusion, , , PRN, de Melchor Amour, [place], MD, 1,452 mcg at 11/30/23 1637   clevidipine (CLEVIPREX) infusion 0.5 mg/mL, 0-21 mg/hr, Intravenous, Continuous, Jefferson Fuel, MD   pantoprazole (PROTONIX) injection 40 mg, 40 mg, Intravenous, QHS, Erick Blinks, MD   senna-docusate (Senokot-S) tablet 1 tablet, 1 tablet, Oral, QHS PRN, Erick Blinks, MD   verapamil (ISOPTIN) injection, , , PRN, de Melchor Amour, [place], MD, 5 mg at 11/30/23 1626  Current Outpatient Medications:    aspirin EC 81 MG tablet, Take 81 mg by mouth daily., Disp: , Rfl:    atorvastatin (LIPITOR) 40 MG tablet, Take 20 mg every evening by mouth., Disp: , Rfl: 2   esomeprazole (NEXIUM 24HR) 20 MG capsule, Take 20 mg daily at 12 noon by mouth., Disp: , Rfl:    levothyroxine (SYNTHROID, LEVOTHROID) 50 MCG tablet, Take 50 mcg by mouth daily., Disp: , Rfl: 0   naproxen sodium (ALEVE) 220 MG tablet, Take 660-880 mg 2 (two) times daily as needed by mouth (for pain/headaches). , Disp: , Rfl:   Facility-Administered Medications Ordered in Other Encounters:    ePHEDrine sulfate (PF) 5mg /mL syringe, , Intravenous, Anesthesia Intra-op, Salnikova, Evgenia A, CRNA, 5 mg at 11/30/23 1637   lactated ringers infusion, , Intravenous, Continuous PRN, Salnikova, Evgenia A, CRNA, New Bag at 11/30/23 1605   phenylephrine (NEO-SYNEPHRINE) 20mg /NS premix infusion, , Intravenous, Continuous PRN, Salnikova, Evgenia A, CRNA, Last Rate: 75 mL/hr at 11/30/23 1635, 100 mcg/min at 11/30/23 1635   propofol (DIPRIVAN) 10 mg/mL bolus/IV push, , Intravenous, Anesthesia Intra-op, Salnikova, Evgenia A, CRNA, 150 mg at 11/30/23 1610   rocuronium (ZEMURON) injection, , Intravenous, Anesthesia Intra-op, Salnikova, Evgenia A, CRNA, 30 mg at 11/30/23 1618   succinylcholine (ANECTINE) syringe, , Intravenous,  Anesthesia Intra-op, Salnikova, Evgenia A, CRNA, 200 mg at 11/30/23 1610  Vitals   Vitals:   11/30/23 1450 11/30/23 1455 11/30/23 1510 11/30/23 1525  BP: 131/73 120/75 (!) 144/64 (!) 154/86  Pulse: 71 70 74 71  Resp: 16 16 17 17   Temp:   97.9 F (36.6 C) 98.3 F (36.8 C)  TempSrc:      SpO2: 96% 97% 97% 96%  Weight:      Height:        Body mass index is 29.75 kg/m.  Physical Exam   General: Laying comfortably in bed; in no acute distress.  HENT: Normal oropharynx and mucosa. Normal external appearance of ears and nose.  Neck: Supple, no pain or tenderness  CV: No JVD. No peripheral edema.  Pulmonary:  Symmetric Chest rise. Normal respiratory effort.  Abdomen: Soft to touch, non-tender.  Ext: No cyanosis, edema, or deformity  Skin: No rash. Normal palpation of skin.   Musculoskeletal: Normal digits and nails by inspection. No clubbing.   Neurologic Examination  Mental status/Cognition: Alert, oriented to self, place, month and year, good attention.  Speech/language: Dysarthric speech, fluent, comprehension intact, object naming intact, repetition intact.  Cranial nerves:   CN II Pupils equal and reactive to light, left hemianopsia   CN III,IV,VI Right gaze deviation, approaches midline but does not cross midline.   CN V Left sensory deficit   CN VII Left facial droop   CN VIII Makes eye contact to speech   CN IX & X Protecting his airway   CN XI Head turned to the right   CN XII midline tongue protrusion   Motor:  Muscle bulk: Normal, tone flaccid in left upper extremity Mvmt Root Nerve  Muscle Right Left Comments  SA C5/6 Ax Deltoid 5 0   EF C5/6 Mc Biceps     EE C6/7/8 Rad Triceps     WF C6/7 Med FCR     WE C7/8 PIN ECU     F Ab C8/T1 U ADM/FDI 5 1   HF L1/2/3 Fem Illopsoas 4 2   KE L2/3/4 Fem Quad     DF L4/5 D Peron Tib Ant     PF S1/2 Tibial Grc/Sol      Sensation:  Light touch Does not regard touch of the left   Pin prick    Temperature    Vibration    Proprioception    Coordination/Complex Motor:  - Finger to Nose intact on the right - Heel to shin unable to do due to weakness. - Rapid alternating movement are slowed on the right, unable to do with the left. - Gait: Gait deferred for patient's safety. Labs/Imaging/Neurodiagnostic studies   CBC:  Recent Labs  Lab 2023/12/11 1424 12-11-23 1430  WBC 8.0  --   NEUTROABS 4.5  --   HGB 16.4 15.0  HCT 46.8 44.0  MCV 86.8  --   PLT 222  --    Basic Metabolic Panel:  Lab Results  Component Value Date   NA 139 12-11-23   K 3.8 12/11/23   CO2 24 12-11-23   GLUCOSE 106 (H) 12/11/2023   BUN 14 11-Dec-2023   CREATININE 1.20 2023-12-11   CALCIUM 9.0 12/11/23   GFRNONAA >60 2023/12/11   GFRAA >60 10/20/2017   Lipid Panel:  Lab Results  Component Value Date   LDLCALC 102 (H) 01/03/2017   HgbA1c:  Lab Results  Component Value Date   HGBA1C 5.4 01/03/2017   Urine Drug Screen: No results found for: "LABOPIA", "COCAINSCRNUR", "LABBENZ", "AMPHETMU", "THCU", "LABBARB"  Alcohol Level     Component Value Date/Time   ETH <10 Dec 11, 2023 1424   INR  Lab Results  Component Value Date   INR 1.1 Dec 11, 2023   APTT  Lab Results  Component Value Date   APTT 22 (L) 2023-12-11   AED levels: No results found for: "PHENYTOIN", "ZONISAMIDE", "LAMOTRIGINE", "LEVETIRACETA"  CT Head without contrast(Personally reviewed): CTH was negative for a large hypodensity concerning for a large territory infarct or hyperdensity concerning for an ICH  CT angio Head and Neck with contrast(Personally reviewed): R ICA occlusion with distal intracranial reconstitution before occlusion of the proximal right MCA M1 with poor opacification of the distal vessels.  MRI Brain: Pending  ASSESSMENT   Othal Lovenia Kim  is a 62 y.o. male with history of GERD, hypothyroidism, seizures who presented to [facility] ED with persistent right gaze deviation and left hemiplegia and left hemianopsia.   He was found to have right ICA occlusion with distal intracranial reconstitution followed by proximal right MCA M1 occlusion with poor distal reconstitution.  He was given TNKase and was transferred emergently to [facility] for thrombectomy.  Etiology of his stroke is unclear but I suspect underlying large artery atherosclerosis or cardioembolism.  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 - HbA1c: Pending - Antithrombotic: Start ASA 81 mg daily if 24 h CTH does not show acute hemorrhage - DVT prophylaxis: SCDs. Pharmacologic prophylaxis if 24 h CTH does not demonstrate acute hemorrhage - Systolic Blood Pressure goal: < 180 mm Hg - Telemetry monitoring for arrhythmia: 72 hours - Swallow screen - ordered - PT/OT/SLP consults  Hypothyroidism: Continue home Synthroid.  GERD: - Continue home PPI.  Hyperlipidemia: - Continue home atorvastatin.  LDL is pending.  CODE STATUS discussed with patient's wife Ms. Donnella Bi and patient is full code. Allergies verified the patient's wife Ms. Donnella Bi and patient has no known allergies.  ______________________________________________________________________  This patient is critically ill and at significant risk of neurological worsening, death and care requires constant monitoring of vital signs, hemodynamics,respiratory and cardiac monitoring, neurological assessment, discussion with family, other specialists and medical decision making of high complexity. I spent 38 minutes of neurocritical care time  in the care of  this patient. This was time spent independent of any time provided  by nurse practitioner or PA.  Erick Blinks [facility] 11/30/2023  4:55 PM  Signed, Erick Blinks, MD [facility]

## 2023-11-30 NOTE — Progress Notes (Signed)
Pt transported on vent from PACU to 4N22 without any complications. RN at bedside, RT will monitor as needed.

## 2023-11-30 NOTE — ED Notes (Signed)
Pt notifed MD and RN that he had headache. Pt transported back to CT to r/o bleed. CT finding negative per neurologist.

## 2023-11-30 NOTE — ED Notes (Signed)
TIME OUT done: MRN verified, armband, weight, and medication

## 2023-11-30 NOTE — Sedation Documentation (Addendum)
Pt. Brought to PACU by this RN and CRNA at 1738 via stretcher. No bed assignment at this time. No valuables noted and confirmed by family. Family updated by this RN in person. Alean Rinne, RN and RN x3 updated at bedside. Cangrelor infusion running at 85mcg/kg with updated weight - full bag given to PACU and RN x3 educated on mixture by this RN to prevent any downtime in medication administration.

## 2023-11-30 NOTE — Code Documentation (Signed)
Ian Wong is a 62 yr old male transferred from [facility] to [facility] for IR. Pt was LKW today at 1230, after which he developed left sided weakness. At [facility] he was given TNK at 1455. CTA shows Rt ICA and Rt M1 occlusion. Pt arrived [facility] at 1600, was examined and taken to IR suite. Pt with left sided flaccid, rt gaze preference, lt hemianopia, dysarthria and neglect. NIHSS 16. Handoff with IR RN complete. Several unsuccessful attempts made to contact pt's wife Roberts Larralde.

## 2023-11-30 NOTE — Progress Notes (Signed)
eLink Physician-Brief Progress Note Patient Name: Ian Wong DOB: 04/08/1961 MRN: [id number]   Date of Service  11/30/2023  HPI/Events of Note  62 year old man with history of peripheral vascular disease who had acute onset of right gaze deviation and left hemiplegia on 12/30.   Status post tenecteplase and mechanical thrombectomy with right carotid stenting.  CT head at 8 PM without any acute abnormalities.  SBP goal 120-150.  eICU Interventions  Add low-dose phenylephrine as needed     Intervention Category Intermediate Interventions: Hypotension - evaluation and management  India Jolin 11/30/2023, 9:57 PM

## 2023-11-30 NOTE — Progress Notes (Signed)
Pharmacy Antibiotic Note  Ian Wong is a 62 y.o. male admitted on 11/30/2023 with  aspiration PNA .  Pharmacy has been consulted for unasyn dosing.  CTA finding occlusion of R ICA with distal intracranial reconstitution/occlusion of R MCA now s/p TNKase and IR thrombectomy w/ R carotid stenting. Per notes, had an vomiting episode while getting intubated so consider for aspiration. WBC 8, afebrile. Scr 1.2 (CrCl 76 mL/min).   Plan: Unasyn 3g IV every 6 hours Monitor renal function, cx results, clinical pic, and LOT   Height: 5\' 11"  (180.3 cm) Weight: 96.8 kg (213 lb 4.8 oz) IBW/kg (Calculated) : 75.3  Temp (24hrs), Avg:97.8 F (36.6 C), Min:97.5 F (36.4 C), Max:98.3 F (36.8 C)  Recent Labs  Lab 11/30/23 1424 11/30/23 1430  WBC 8.0  --   CREATININE 1.08 1.20    Estimated Creatinine Clearance: 76.7 mL/min (by C-G formula based on SCr of 1.2 mg/dL).    No Known Allergies  Antimicrobials this admission: Unasyn 12/30 >>   Dose adjustments this admission: N/A  Microbiology results: 12/30 COVID/Flu PCR: neg  Thank you for allowing pharmacy to participate in this patient's care,  Sherron Monday, PharmD, BCCCP Clinical Pharmacist  Phone: [phone] 11/30/2023 6:36 PM  Please check AMION for all [facility] phone numbers After 10:00 PM, call Main Pharmacy [phone]

## 2023-11-30 NOTE — Sedation Documentation (Signed)
Per CRNA, pt. Vomited around 1612 after OGT placement. Stroke care paused per Dr. Tommie Sams at [phone] to address airway concern. After clearing airway by CRNA, stroke care restarted at 1618.

## 2023-11-30 NOTE — Consult Note (Signed)
NEUROLOGY TELECONSULTATION NOTE   Date of service: November 30, 2023 Patient Name: Ian Wong MRN:  [id number] DOB:  02-04-1961 Reason for consult: telestroke  Requesting Provider: Dr. Eber Hong Consult Participants: myself, patient, telestroke RN, bedside RN Location of the provider: [facility] Location of the patient: [facility]  This consult was provided via telemedicine with 2-way video and audio communication. The patient/family was informed that care would be provided in this way and agreed to receive care in this manner.   _ _ _   _ __   _ __ _ _  __ __   _ __   __ _  History of Present Illness   Ian Wong is a 62 y.o. male with PMH significant for  has a past medical history of GERD (gastroesophageal reflux disease), Hypothyroidism, and Seizures (HCC). who presents with  R gaze deviation and left hemiaplasia.  Last known well was 12:45 PM at which point he was noted to begin slobbering and became weak on the left with difficulty speaking.  NIH stroke scale was 24.  CT head showed no ICH within aspects of 8.  CTA showed occlusion of the right ICA with reconstitution and then reocclusion of the right MCA.  Risks, benefits, and alternatives to TNK were discussed with wife Marylene Land who gave informed consent to proceed.  Patient met all inclusion criteria and no exclusion criteria therefore TNK was administered.  Risks, benefits, and alternatives to mechanical thrombectomy were discussed with wife Marylene Land who gave informed consent to proceed.  Case was discussed with the neuro interventionalists Dr. Judie Bonus and a code IR was activated.  Consent for TNK and mechanical thrombectomy provided by wife Marylene Land over video Aadin Laton [phone]   ROS   Per HPI; all other systems reviewed and are negative  Past History   The following was personally reviewed:  Past Medical History:  Diagnosis Date   GERD (gastroesophageal reflux disease)    Hypothyroidism    Seizures  (HCC)    Past Surgical History:  Procedure Laterality Date   AORTA - BILATERAL FEMORAL ARTERY BYPASS GRAFT Bilateral 01/07/2017   Procedure: AORTOBIFEMORAL BYPASS GRAFT;  Surgeon: Larina Earthly, MD;  Location[facility] OR;  Service: Vascular;  Laterality: Bilateral;   I & D EXTREMITY Left 11/28/2014   Procedure: IRRIGATION AND DEBRIDEMENT;  REPAIR OF CRUSH INJURY;  Surgeon: Betha Loa, MD;  Location: [facility] OR;  Service: Orthopedics;  Laterality: Left;   INCISIONAL HERNIA REPAIR N/A 10/26/2017   Procedure: HERNIA REPAIR INCISIONAL WITH MESH;  Surgeon: Franky Macho, MD;  Location: [facility] ORS;  Service: General;  Laterality: N/A;   Family History  Problem Relation Age of Onset   COPD Mother    Other Brother    Social History   Socioeconomic History   Marital status: Married    Spouse name: Not on file   Number of children: Not on file   Years of education: Not on file   Highest education level: Not on file  Occupational History   Not on file  Tobacco Use   Smoking status: Former    Current packs/day: 0.00    Types: Cigarettes    Quit date: 12/2016    Years since quitting: 7.0   Smokeless tobacco: Never  Vaping Use   Vaping status: Never Used  Substance and Sexual Activity   Alcohol use: Yes    Comment: last drank etoh 1 1/2 months ago.   Drug use: No   Sexual activity:  Not on file  Other Topics Concern   Not on file  Social History Narrative   Not on file   Social Drivers of Health   Financial Resource Strain: Not on file  Food Insecurity: Not on file  Transportation Needs: Not on file  Physical Activity: Not on file  Stress: Not on file  Social Connections: Not on file   No Known Allergies  Medications   (Not in a hospital admission)    No current facility-administered medications for this encounter.  Current Outpatient Medications:    aspirin EC 81 MG tablet, Take 81 mg by mouth daily., Disp: , Rfl:    atorvastatin (LIPITOR) 40 MG tablet, Take 20 mg every evening by  mouth., Disp: , Rfl: 2   esomeprazole (NEXIUM 24HR) 20 MG capsule, Take 20 mg daily at 12 noon by mouth., Disp: , Rfl:    levothyroxine (SYNTHROID, LEVOTHROID) 50 MCG tablet, Take 50 mcg by mouth daily., Disp: , Rfl: 0   naproxen sodium (ALEVE) 220 MG tablet, Take 660-880 mg 2 (two) times daily as needed by mouth (for pain/headaches). , Disp: , Rfl:   Vitals   There were no vitals filed for this visit.   There is no height or weight on file to calculate BMI.  Physical Exam   Exam performed over telemedicine with 2-way video and audio communication and with assistance of bedside RN  Physical Exam Gen: alert, oriented to self, age, and month but becomes confused in conversation Resp: normal WOB CV: extremities appear well-perfused  Neuro: *UE:AVWUJ, oriented to self, age, and month but becomes confused in conversation, follows single step commands *Speech: severe dysarthria, moderate aphasia *CN: PERRL 3mm, R forced gaze deviation, blinks to threa R eye only, sensation intact, R facial droop, hearing intact to voice *Motor:   Normal bulk.  No tremor, rigidity or bradykinesia. No drift LUE, drift but not to bed LLE, no movement in either LUE or LLE *Sensory: diminished withdrawal to noxious stimuli on the L *Coordination:  FNF intact on R *Reflexes:  UTA 2/2 tele-exam *Gait: deferred  NIHSS  1a Level of Conscious.: 0 1b LOC Questions: 2 1c LOC Commands: 2 2 Best Gaze: 2 3 Visual: 2 4 Facial Palsy: 2 5a Motor Arm - left: 4 5b Motor Arm - Right: 0 6a Motor Leg - Left: 4 6b Motor Leg - Right: 1 7 Limb Ataxia: 0 8 Sensory: 1 9 Best Language: 2 10 Dysarthria: 2 11 Extinct. and Inatten.: 0  TOTAL: 24    Premorbid mRS = 2    Labs   CBC: No results for input(s): "WBC", "NEUTROABS", "HGB", "HCT", "MCV", "PLT" in the last 168 hours.  Basic Metabolic Panel:  Lab Results  Component Value Date   NA 137 10/20/2017   K 3.8 10/20/2017   CO2 23 10/20/2017   GLUCOSE 90  10/20/2017   BUN 12 10/20/2017   CREATININE 1.20 10/20/2017   CALCIUM 9.3 10/20/2017   GFRNONAA >60 10/20/2017   GFRAA >60 10/20/2017   Lipid Panel:  Lab Results  Component Value Date   LDLCALC 102 (H) 01/03/2017   HgbA1c:  Lab Results  Component Value Date   HGBA1C 5.4 01/03/2017   Urine Drug Screen: No results found for: "LABOPIA", "COCAINSCRNUR", "LABBENZ", "AMPHETMU", "THCU", "LABBARB"  Alcohol Level No results found for: "ETH"  CT Head without contrast: 1. Hyperdense right M1 MCA, concerning for thrombus. See forthcoming CTA. 2. Findings compatible with acute right MCA territory infarct with loss of gray  differentiation in the right insula and the overlying operculum. ASPECTS 8.  CT angio Head and Neck with contrast: 1. Occluded right ICA at its origin with non opacification throughout the neck. The right paraclinoid ICA briefly reconstitutes with subsequent occlusion of the right M1 MCA, concerning for thrombus given hyperdense appearance on same day CT head. 2. Severe right vertebral artery origin stenosis. 3. Severe stenosis of the intradural small/non dominant left vertebral artery. 4. Approximately 70% stenosis of the left ICA origin.  Repeat CT head performed after patient complained of headache showed no ICH    Impression   Jeffie Woolford is a 62 y.o. male with PMH significant for  has a past medical history of GERD (gastroesophageal reflux disease), Hypothyroidism, and Seizures (HCC). who presents with  R gaze deviation and left hemiaplasia.  Last known well was 12:45 PM. NIH stroke scale was 24.  CT head showed no ICH within aspects of 8.  CTA showed occlusion of the right ICA with reconstitution and then reocclusion of the right MCA.  Patient was administered TNK and transferred to [facility] as Code IR for mechanical thrombectomy. Consent for both obtained from wife Holt Leard (contact info in HPI).  Recommendations   - Stat transfer to [facility] for mechanical  thrombectomy - Patient will be admitted to 4N neuro ICU after procedure under Dr. Marchelle Folks - Neurochecks and NIHSS documentation per post-TNK protocol - STAT head CT for any change in neurologic exam - no aspirin for 24 hours post TNK and until ICH ruled out by repeat head CT - keep SBP less than 180/105 for the 1st 24 hours post TNK to reduce the risk of hemorrhagic transformation - SCDs for DVT prophylaxis; can start SQ Heparin if head CT 24 hours post TNK is negative for ICH - NPO - Further instructions per IR and admitting teams  D/w Drs. Derry Lory, de Dorice Lamas, and Hyacinth Meeker  This patient is critically ill and at significant risk of neurological worsening, death and care requires constant monitoring of vital signs, hemodynamics,respiratory and cardiac monitoring, neurological assessment, discussion with family, other specialists and medical decision making of high complexity. I spent 75 minutes of neurocritical care time  in the care of  this patient. This was time spent independent of any time provided by nurse practitioner or PA.  Bing Neighbors, MD [facility] [phone]  If 7pm- 7am, please page neurology on call as listed in AMION.

## 2023-11-30 NOTE — Progress Notes (Addendum)
EMS pre-alert code stroke at 1406. EMS reported right sided gaze preference and left sided weakness. Pt arrived to ED at 1421. Dr. Hyacinth Meeker at bedside for assessment on arrival. VAN positive. Dr. Selina Cooley paged on patient's arrival to ED. Pt taken to CT at 1423. Dr. Selina Cooley joined tele-neuro cart for exam. NIHSS performed. Pt taken back to ED at 1439. Wife and sisters brought back to room. LKWT was confirmed by family as 1245. TNK risks/benefits discussed with patient and family by Dr. Selina Cooley. Time out performed by bedside staff, TSRN and Dr. Selina Cooley. Pt name, dob, weight, lkwt, glucose and no bleeding on NCCT head verified. TNK 24 mg given IV push by bedside RN at 1455. Pt has confirmed LVO, Dr. Selina Cooley called Code IR and spoke to [facility] Dr. Alger Memos. Pt was accepted for intervention at [facility]. Pt c/o headache at 1509, taken for repeat head CT. Repeat head CT without hemorrhage per Dr. Selina Cooley. Pt left with [facility] for transport to [facility] at 1534. TSRN logged off cart at that time.

## 2023-11-30 NOTE — Plan of Care (Signed)
Head CT reviewed 1. No acute intracranial abnormality. 2. Chronic small vessel disease. 3. Exam quality is markedly degraded by streak artifacts from outside the field of view.  Patient remains clinically stable per RN report; continue antiplatelets as per IR recs   Brooke Dare MD-PhD [facility] [phone]

## 2023-11-30 NOTE — Sedation Documentation (Signed)
Care per Evgenia, CRNA at this time. See CRNA doc.

## 2023-11-30 NOTE — Consult Note (Signed)
NAME:  Ian Wong, MRN:  [id number], DOB:  07/03/1961, LOS: 0 ADMISSION DATE:  11/30/2023, CONSULTATION DATE:  11/30/23 REFERRING MD:  Joana Reamer Rodgriguez CHIEF COMPLAINT:  L weakness   History of Present Illness:  Pt is encephelopathic; therefore, this HPI is obtained from chart review.Ian Wong is a 62 y.o. male who has a PMH as below and who presented to [facility] ED 12/30 with R gaze deviation and L hemiplegia. He was last known well around 12:45PM that day. CTH demonstrated ASPECTS 10 with no large territory infarct or ICH. CTA demonstrated occlusion of R ICA with distal intracranial reconstitution and occlusion of R MCA. He was given TNKase and transferred to [facility] for neuro IR intervention.  Upon arrival to [facility], he was taken to IR where he had diagnostic cerebral angiogram with mechanical thrombectomy, right carotid stenting and angioplasty with cerebral protection device.  While getting him intubated for IR procedure, he had a vomiting episode and there was concern for aspiration; therefore, post procedure, anesthesia opted to leave him intubated. PCCM was subsequently called for assistance with ventilator management.  Pertinent  Medical History:  has Cellulitis; Cellulitis of left lower extremity; PAD (peripheral artery disease) (HCC); Critical lower limb ischemia (HCC); Leriche syndrome (HCC); Aortoiliac occlusive disease (HCC); Incisional hernia, without obstruction or gangrene; Acute ischemic right MCA stroke (HCC); and Acute right MCA stroke (HCC) on their problem list.  Significant Hospital Events: Including procedures, antibiotic start and stop dates in addition to other pertinent events   12/30 to [facility] for diagnostic cerebral angiogram with mechanical thrombectomy, right carotid stenting and angioplasty with cerebral protection device, remained intubated post procedure.  Interim History / Subjective:  On vent, sedated.  Objective:  Blood pressure 116/60, pulse (!) 54,  temperature (!) 97.5 F (36.4 C), resp. rate 19, height 5\' 11"  (1.803 m), weight 96.8 kg, SpO2 100%.    Vent Mode: PRVC FiO2 (%):  [100 %] 100 % Set Rate:  [18 bmp] 18 bmp Vt Set:  [600 mL] 600 mL PEEP:  [5 cmH20] 5 cmH20 Plateau Pressure:  [10 cmH20] 10 cmH20   Intake/Output Summary (Last 24 hours) at 11/30/2023 1836 Last data filed at 11/30/2023 1747 Gross per 24 hour  Intake 550 ml  Output 950 ml  Net -400 ml   Filed Weights   11/30/23 1442 11/30/23 1445  Weight: 99.5 kg 96.8 kg    Examination: General: Adult male, resting in bed, in NAD. Neuro: Sedated, does not respond but does have intermittent jerking of extremities. No pattern or rhythm. Does seem to withdraw to pain in all 4 extremities. HEENT: [place]/AT. Sclerae anicteric. ETT in place. Cardiovascular: RRR, no M/R/G.  Lungs: Respirations even and unlabored.  CTA bilaterally, No W/R/R. Abdomen: BS x 4, soft, NT/ND.  Musculoskeletal: No gross deformities, no edema.  Skin: Intact, warm, no rashes.   Labs/imaging personally reviewed:  CTH/CTA head 12/30 > SPECTS 10 with no large territory infarct or ICH. CTA demonstrated occlusion of R ICA with distal intracranial reconstitution and occlusion of R MCA. CTH 12/30 >  MRI brain 12/31 >  Echo 12/31 >  Assessment & Plan:   R ICA and R MCA occlusion - s/p TNKase followed by diagnostic cerebral angiogram with mechanical thrombectomy, right carotid stenting and angioplasty with cerebral protection device. - Post procedure management per IR. - Stroke workup / management per neuro. - F/u on CT head, MRI brain, echo. - ASA 12/31 at 24 hours after TNKase. - PT/OT/SLP when able.  Hypertension. - Cleviprex PRN for goal SBP 120 - 140 per neuro.  Respiratory insufficiency - with concern for possible aspiration after vomiting episode event during intubation - Full vent support. - Wean as able. - Daily SBT. - Start empiric Unasyn for now. - Send trach aspirate. - Get PCT now  and in AM, if low consider early d/c abx. - Bronchial hygiene. - Follow CXR.  Hx HLD. - Continue PTA Statin. - Hold PTA ASA until repeat CTH confirms no hemorrhage.  Hx Hypothyroidism. - Continue PTA Synthroid.  Best practice (evaluated daily):  Diet/type: NPO DVT prophylaxis: SCD Pressure ulcer(s): pressure ulcer assessment deferred  GI prophylaxis: PPI Lines: N/A Foley:  N/A Code Status:  full code Last date of multidisciplinary goals of care discussion: None yet.  Labs   CBC: Recent Labs  Lab 11/30/23 1424 11/30/23 1430  WBC 8.0  --   NEUTROABS 4.5  --   HGB 16.4 15.0  HCT 46.8 44.0  MCV 86.8  --   PLT 222  --     Basic Metabolic Panel: Recent Labs  Lab 11/30/23 1424 11/30/23 1430  NA 135 139  K 3.7 3.8  CL 105 103  CO2 24  --   GLUCOSE 108* 106*  BUN 14 14  CREATININE 1.08 1.20  CALCIUM 9.0  --    GFR: Estimated Creatinine Clearance: 76.7 mL/min (by C-G formula based on SCr of 1.2 mg/dL). Recent Labs  Lab 11/30/23 1424  WBC 8.0    Liver Function Tests: Recent Labs  Lab 11/30/23 1424  AST 24  ALT 23  ALKPHOS 59  BILITOT 0.8  PROT 7.4  ALBUMIN 3.6   No results for input(s): "LIPASE", "AMYLASE" in the last 168 hours. No results for input(s): "AMMONIA" in the last 168 hours.  ABG    Component Value Date/Time   PHART 7.376 01/07/2017 1258   PCO2ART 42.3 01/07/2017 1258   PO2ART 80.1 (L) 01/07/2017 1258   HCO3 24.6 01/07/2017 1258   TCO2 23 11/30/2023 1430   ACIDBASEDEF 0.1 01/07/2017 1258   O2SAT 95.5 01/07/2017 1258     Coagulation Profile: Recent Labs  Lab 11/30/23 1424  INR 1.1    Cardiac Enzymes: No results for input(s): "CKTOTAL", "CKMB", "CKMBINDEX", "TROPONINI" in the last 168 hours.  HbA1C: Hgb A1c MFr Bld  Date/Time Value Ref Range Status  01/03/2017 11:51 AM 5.4 4.8 - 5.6 % Final    Comment:    (NOTE)         Pre-diabetes: 5.7 - 6.4         Diabetes: >6.4         Glycemic control for adults with diabetes:  <7.0     CBG: No results for input(s): "GLUCAP" in the last 168 hours.  Review of Systems:   Unable to obtain as pt is encephalopathic.  Past Medical History:  He,  has a past medical history of GERD (gastroesophageal reflux disease), Hypothyroidism, and Seizures (HCC).   Surgical History:   Past Surgical History:  Procedure Laterality Date   AORTA - BILATERAL FEMORAL ARTERY BYPASS GRAFT Bilateral 01/07/2017   Procedure: AORTOBIFEMORAL BYPASS GRAFT;  Surgeon: Larina Earthly, MD;  Location: [facility] OR;  Service: Vascular;  Laterality: Bilateral;   I & D EXTREMITY Left 11/28/2014   Procedure: IRRIGATION AND DEBRIDEMENT;  REPAIR OF CRUSH INJURY;  Surgeon: Betha Loa, MD;  Location: [facility] OR;  Service: Orthopedics;  Laterality: Left;   INCISIONAL HERNIA REPAIR N/A 10/26/2017   Procedure: HERNIA REPAIR  INCISIONAL WITH MESH;  Surgeon: Franky Macho, MD;  Location: [facility] ORS;  Service: General;  Laterality: N/A;     Social History:   reports that he quit smoking about 7 years ago. His smoking use included cigarettes. He has never used smokeless tobacco. He reports that he does not currently use alcohol. He reports that he does not use drugs.   Family History:  His family history includes COPD in his mother; Other in his brother.   Allergies No Known Allergies   Home Medications  Prior to Admission medications   Medication Sig Start Date End Date Taking? Authorizing Provider  aspirin EC 81 MG tablet Take 81 mg by mouth daily.    [provider]  atorvastatin (LIPITOR) 40 MG tablet Take 20 mg every evening by mouth. 09/21/17   [provider]  esomeprazole (NEXIUM 24HR) 20 MG capsule Take 20 mg daily at 12 noon by mouth.    [provider]  levothyroxine (SYNTHROID, LEVOTHROID) 50 MCG tablet Take 50 mcg by mouth daily. 11/20/16   [provider]  naproxen sodium (ALEVE) 220 MG tablet Take 660-880 mg 2 (two) times daily as needed by mouth (for pain/headaches).      [provider]     Critical care time: 35 min.    Rutherford Guys, PA - [facility] For pager details, please see AMION or use Epic chat  After 1900, please call [facility] for cross coverage needs 11/30/2023, 6:36 PM

## 2023-11-30 NOTE — Anesthesia Procedure Notes (Signed)
Procedure Name: Intubation Date/Time: 11/30/2023 4:10 PM  Performed by: Susy Manor, CRNAPre-anesthesia Checklist: Patient identified, Emergency Drugs available, Suction available and Patient being monitored Patient Re-evaluated:Patient Re-evaluated prior to induction Oxygen Delivery Method: Circle System Utilized Preoxygenation: Pre-oxygenation with 100% oxygen Induction Type: IV induction Ventilation: Mask ventilation without difficulty Laryngoscope Size: Glidescope and 3 Grade View: Grade I Tube type: Oral Tube size: 7.5 mm Number of attempts: 1 Airway Equipment and Method: Stylet and Oral airway Placement Confirmation: ETT inserted through vocal cords under direct vision, positive ETCO2 and breath sounds checked- equal and bilateral Secured at: 23 cm Tube secured with: Tape Dental Injury: Teeth and Oropharynx as per pre-operative assessment

## 2023-11-30 NOTE — ED Provider Notes (Signed)
[place] EMERGENCY DEPARTMENT AT [facility] Provider Note   CSN: [id number] Arrival date & time: 11/30/23  1421  An emergency department physician performed an initial assessment on this suspected stroke patient at 1415.  History  Chief Complaint  Patient presents with   Code Stroke    Ian Wong is a 62 y.o. male.  HPI   Patient is a 62 year old male, he has a history of hypothyroidism and acid reflux as well as hypercholesterolemia and he does take a baby aspirin every day.  He presents to the hospital today with a complaint of weakness, evidently the patient was found on the ground after having last been seen normal around 12:30 in the afternoon.  That was approximately 2 hours prior to arrival.  The patient arrives around 2:20 PM when I saw the patient on arrival and talk to EMS to obtain information.  They report that the patient was found by family members on the ground in his room, his eyes were deviated to the right and he was flaccid on the left.  The patient is able to answer some questions and move his right side, he had some hypertension but it was not severe, no tachycardia, he endorses taking a baby aspirin but no other blood thinners.  There is no family available by phone, the phone number on chart is for a spouse and they are not answering the phone  Home Medications Prior to Admission medications   Medication Sig Start Date End Date Taking? Authorizing Provider  aspirin EC 81 MG tablet Take 81 mg by mouth daily.    [provider]  atorvastatin (LIPITOR) 40 MG tablet Take 20 mg every evening by mouth. 09/21/17   [provider]  esomeprazole (NEXIUM 24HR) 20 MG capsule Take 20 mg daily at 12 noon by mouth.    [provider]  levothyroxine (SYNTHROID, LEVOTHROID) 50 MCG tablet Take 50 mcg by mouth daily. 11/20/16   [provider]  naproxen sodium (ALEVE) 220 MG tablet Take 660-880 mg 2 (two) times daily as needed  by mouth (for pain/headaches).     [provider]      Allergies    Patient has no known allergies.    Review of Systems   Review of Systems  All other systems reviewed and are negative.   Physical Exam Updated Vital Signs BP 120/75   Pulse 70   Temp 97.8 F (36.6 C) (Oral)   Resp 16   Ht 1.803 m (5\' 11" )   Wt 96.8 kg   SpO2 97%   BMI 29.75 kg/m  Physical Exam Vitals and nursing note reviewed.  Constitutional:      General: He is not in acute distress.    Appearance: He is well-developed.  HENT:     Head: Normocephalic and atraumatic.     Mouth/Throat:     Pharynx: No oropharyngeal exudate.  Eyes:     General: No scleral icterus.       Right eye: No discharge.        Left eye: No discharge.     Conjunctiva/sclera: Conjunctivae normal.     Pupils: Pupils are equal, round, and reactive to light.  Neck:     Thyroid: No thyromegaly.     Vascular: No JVD.  Cardiovascular:     Rate and Rhythm: Normal rate and regular rhythm.     Heart sounds: Normal heart sounds. No murmur heard.    No friction rub. No gallop.  Pulmonary:     Effort: Pulmonary effort is normal. No respiratory distress.     Breath sounds: Normal breath sounds. No wheezing or rales.  Abdominal:     General: Bowel sounds are normal. There is no distension.     Palpations: Abdomen is soft. There is no mass.     Tenderness: There is no abdominal tenderness.  Musculoskeletal:        General: No tenderness. Normal range of motion.     Cervical back: Normal range of motion and neck supple.  Lymphadenopathy:     Cervical: No cervical adenopathy.  Skin:    General: Skin is warm and dry.     Findings: No erythema or rash.  Neurological:     Mental Status: He is alert.     Coordination: Coordination normal.     Comments: The patient has flaccid left upper and left lower extremity, there is no sensation to touch on that side, he has no facial droop but there is right sided visual gaze, he is able  to talk with normal speech  Psychiatric:        Behavior: Behavior normal.     ED Results / Procedures / Treatments   Labs (all labs ordered are listed, but only abnormal results are displayed) Labs Reviewed  APTT - Abnormal; Notable for the following components:      Result Value   aPTT 22 (*)    All other components within normal limits  DIFFERENTIAL - Abnormal; Notable for the following components:   Monocytes Absolute 1.1 (*)    All other components within normal limits  POCT I-STAT, CHEM 8 - Abnormal; Notable for the following components:   Glucose, Bld 106 (*)    All other components within normal limits  PROTIME-INR  CBC  ETHANOL  COMPREHENSIVE METABOLIC PANEL  RAPID URINE DRUG SCREEN, HOSP PERFORMED  I-STAT CHEM 8, ED    EKG EKG Interpretation Date/Time:  Monday November 30 2023 14:44:56 EST Ventricular Rate:  76 PR Interval:  166 QRS Duration:  98 QT Interval:  397 QTC Calculation: 447 R Axis:   76  Text Interpretation: Sinus rhythm Low voltage, precordial leads Probable anteroseptal infarct, old Confirmed by Eber Hong ([id number]) on 11/30/2023 2:50:13 PM  Radiology CT HEAD CODE STROKE WO CONTRAST Result Date: 11/30/2023 CLINICAL DATA:  Code stroke.  Neuro deficit, acute, stroke suspected EXAM: CT HEAD WITHOUT CONTRAST TECHNIQUE: Contiguous axial images were obtained from the base of the skull through the vertex without intravenous contrast. RADIATION DOSE REDUCTION: This exam was performed according to the departmental dose-optimization program which includes automated exposure control, adjustment of the mA and/or kV according to patient size and/or use of iterative reconstruction technique. COMPARISON:  None Available. FINDINGS: Brain: Loss of gray-white differentiation in the right insula and overlying right operculum. No evidence of acute hemorrhage, mass effect or midline shift. Additional patchy white matter hypodensities are nonspecific but compatible with  chronic microvascular ischemic disease. Vascular: No hyperdense vessel identified. Skull: No acute fracture identified. Sinuses/Orbits: Clear sinuses.  No acute orbital findings. Other: No mastoid effusions. ASPECTS [employer] Stroke Program Early CT Score) Total score (0-10 with 10 being normal): 8. DR. Selina Cooley paged at the time of dictation.  Awaiting call back. IMPRESSION: 1. Hyperdense right M1 MCA, concerning for thrombus. See forthcoming CTA. 2. Findings compatible with acute right MCA territory infarct with loss of gray differentiation in the right insula and the overlying operculum. ASPECTS 8. Electronically Signed   By: Thornton Dales  Yetta Barre M.D.   On: 11/30/2023 14:47    Procedures .Critical Care  Performed by: Eber Hong, MD Authorized by: Eber Hong, MD   Critical care provider statement:    Critical care time (minutes):  45   Critical care time was exclusive of:  Separately billable procedures and treating other patients   Critical care was necessary to treat or prevent imminent or life-threatening deterioration of the following conditions:  CNS failure or compromise   Critical care was time spent personally by me on the following activities:  Development of treatment plan with patient or surrogate, discussions with consultants, evaluation of patient's response to treatment, examination of patient, obtaining history from patient or surrogate, review of old charts, re-evaluation of patient's condition, pulse oximetry, ordering and review of radiographic studies, ordering and review of laboratory studies and ordering and performing treatments and interventions   I assumed direction of critical care for this patient from another provider in my specialty: no     Care discussed with: admitting provider   Comments:           Medications Ordered in ED Medications  clevidipine (CLEVIPREX) infusion 0.5 mg/mL (has no administration in time range)  iohexol (OMNIPAQUE) 350 MG/ML injection 75  mL (75 mLs Intravenous Contrast Given 11/30/23 1437)  tenecteplase (TNKASE) injection for Stroke 24 mg (24 mg Intravenous Given 11/30/23 1455)    ED Course/ Medical Decision Making/ A&P                                 Medical Decision Making Amount and/or Complexity of Data Reviewed Labs: ordered. Radiology: ordered.   This patient is critically ill with an acute neurologic event.  He has significant and severe deviation of his eyes to the right, they do not cross midline to the left.  He is able to count my fingers in his visual field on the right.  He can move his right arm and right leg and has normal coordination on that side but has left-sided paralysis.  I suspect that he has a large vessel occlusion, he has gone to CT scan and a code stroke was activated.   This patient presents to the ED for concern of weakness, this involves an extensive number of treatment options, and is a complaint that carries with it a high risk of complications and morbidity.  The differential diagnosis includes stroke, hemorrhage, aneurysm, tumor, trauma, the patient will get a CT scan of the cervical spine to rule out fracture as well since there was probably a fall as there is an abrasion on the left cheek   Co morbidities that complicate the patient evaluation  Hypercholesterolemia   Additional history obtained:  Additional history obtained from medical record External records from outside source obtained and reviewed including prior hernia surgery, no other recent admissions to the hospital, care everywhere research, nothing available, demographics without anybody to contact   Lab Tests:  I Ordered, and personally interpreted labs.  The pertinent results include: Metabolic panel is unremarkable, INR of 1.1, CBC without anemia or leukocytosis, CT scan ordered   Imaging Studies ordered:  I ordered imaging studies including CT scan of the head is not concerning for hemorrhage however there is  some findings that are concerning for an acute right M1 MCA occlusion I independently visualized and interpreted imaging which showed acute stroke I agree with the radiologist interpretation   Cardiac Monitoring: / EKG:  The patient was maintained on a cardiac monitor.  I personally viewed and interpreted the cardiac monitored which showed an underlying rhythm of: Sinus rhythm   Consultations Obtained:  I requested consultation with the neurohospitalist Dr. Selina Cooley,  and discussed lab and imaging findings as well as pertinent plan - they recommend: TNK and likely transfer for interventional radiology   Problem List / ED Course / Critical interventions / Medication management  This patient is critically ill with what appears to be an acute ischemic stroke likely from a thrombus, he would likely benefit from an acute thrombectomy or evaluation by IR.  Neurology is with the patient at the bedside, this patient came in as a code stroke and is getting thrombolytic therapy for his acute illness, thankfully he is not severely hypertensive, there is no signs of hemorrhage, he will need ICU level care I ordered medication including TNK for stroke Reevaluation of the patient after these medicines showed that the patient unchanged I have reviewed the patients home medicines and have made adjustments as needed   Social Determinants of Health:  Critically ill   Test / Admission - Considered:  Admit to ICU, transfer to cardiac and neurology center         Final Clinical Impression(s) / ED Diagnoses Final diagnoses:  Acute ischemic stroke [facility])     Eber Hong, MD 11/30/23 1458

## 2023-11-30 NOTE — Sedation Documentation (Signed)
Pt. To remain intubated post stroke per Dr. Tommie Sams. No ICU bed available. Pt. To be recovered in PACU. PACU notified by phone at [phone].

## 2023-11-30 NOTE — Progress Notes (Signed)
SRN Consuella Lose and Dr. Tommie Sams attempted to reach pt's wife Mohab Mednick at [phone] at [phone], and [phone]. Attempted home number [phone] at [phone] as well, no answer.

## 2023-12-01 ENCOUNTER — Other Ambulatory Visit (HOSPITAL_COMMUNITY): Payer: Self-pay | Admitting: Radiology

## 2023-12-01 ENCOUNTER — Inpatient Hospital Stay (HOSPITAL_COMMUNITY): Payer: Medicaid Other

## 2023-12-01 ENCOUNTER — Encounter (HOSPITAL_COMMUNITY): Payer: Self-pay | Admitting: Radiology

## 2023-12-01 DIAGNOSIS — I63511 Cerebral infarction due to unspecified occlusion or stenosis of right middle cerebral artery: Secondary | ICD-10-CM

## 2023-12-01 LAB — BASIC METABOLIC PANEL
Anion gap: 9 (ref 5–15)
BUN: 10 mg/dL (ref 8–23)
CO2: 22 mmol/L (ref 22–32)
Calcium: 8.1 mg/dL — ABNORMAL LOW (ref 8.9–10.3)
Chloride: 108 mmol/L (ref 98–111)
Creatinine, Ser: 1.04 mg/dL (ref 0.61–1.24)
GFR, Estimated: >60 mL/min (ref >60)
Glucose, Bld: 99 mg/dL (ref 70–99)
Potassium: 4.2 mmol/L (ref 3.5–5.1)
Sodium: 139 mmol/L (ref 135–145)

## 2023-12-01 LAB — ECHOCARDIOGRAM COMPLETE
Area-P 1/2: 5.38 cm2
Calc EF: 52.3 %
Height: 71 in
S' Lateral: 3.7 cm
Single Plane A2C EF: 50.2 %
Single Plane A4C EF: 52.3 %
Weight: 3412.8 [oz_av]

## 2023-12-01 LAB — CBC
HCT: 42.7 % (ref 39.0–52.0)
Hemoglobin: 14.6 g/dL (ref 13.0–17.0)
MCH: 30.5 pg (ref 26.0–34.0)
MCHC: 34.2 g/dL (ref 30.0–36.0)
MCV: 89.1 fL (ref 80.0–100.0)
Platelets: 199 10*3/uL (ref 150–400)
RBC: 4.79 MIL/uL (ref 4.22–5.81)
RDW: 13.2 % (ref 11.5–15.5)
WBC: 12.7 10*3/uL — ABNORMAL HIGH (ref 4.0–10.5)
nRBC: 0 % (ref 0.0–0.2)

## 2023-12-01 LAB — LIPID PANEL
Cholesterol: 112 mg/dL (ref 0–200)
HDL: 26 mg/dL — ABNORMAL LOW (ref >40)
LDL Cholesterol: 57 mg/dL (ref 0–99)
Total CHOL/HDL Ratio: 4.3 {ratio}
Triglycerides: 146 mg/dL (ref <150)
VLDL: 29 mg/dL (ref 0–40)

## 2023-12-01 LAB — PROCALCITONIN: Procalcitonin: <0.1 ng/mL

## 2023-12-01 LAB — HEMOGLOBIN A1C
Hgb A1c MFr Bld: 5.9 % — ABNORMAL HIGH (ref 4.8–5.6)
Mean Plasma Glucose: 123 mg/dL

## 2023-12-01 LAB — PHOSPHORUS: Phosphorus: 3.1 mg/dL (ref 2.5–4.6)

## 2023-12-01 LAB — MAGNESIUM: Magnesium: 1.8 mg/dL (ref 1.7–2.4)

## 2023-12-01 MED ORDER — RACEPINEPHRINE HCL 2.25 % IN NEBU
INHALATION_SOLUTION | RESPIRATORY_TRACT | Status: AC
  Filled 2023-12-01: qty 0.5

## 2023-12-01 MED ORDER — TICAGRELOR 90 MG PO TABS
90.0000 mg | ORAL_TABLET | Freq: Two times a day (BID) | ORAL | Status: DC
  Administered 2023-12-01: 90 mg
  Filled 2023-12-01 (×2): qty 1

## 2023-12-01 MED ORDER — ASPIRIN 81 MG PO CHEW: 81.0000 mg | CHEWABLE_TABLET | Freq: Every day | ORAL | Status: DC

## 2023-12-01 MED ORDER — TICAGRELOR 90 MG PO TABS
90.0000 mg | ORAL_TABLET | Freq: Once | ORAL | Status: AC
  Administered 2023-12-01: 90 mg
  Filled 2023-12-01: qty 1

## 2023-12-01 MED ORDER — ASPIRIN 81 MG PO CHEW
81.0000 mg | CHEWABLE_TABLET | Freq: Every day | ORAL | Status: DC
  Administered 2023-12-01: 81 mg
  Filled 2023-12-01: qty 1

## 2023-12-01 MED ORDER — SODIUM CHLORIDE 0.9 % IV BOLUS
500.0000 mL | Freq: Once | INTRAVENOUS | Status: AC
  Administered 2023-12-01: 500 mL via INTRAVENOUS

## 2023-12-01 MED ORDER — TICAGRELOR 90 MG PO TABS: 90.0000 mg | ORAL_TABLET | Freq: Once | ORAL | Status: DC

## 2023-12-01 MED ORDER — TICAGRELOR 90 MG PO TABS
90.0000 mg | ORAL_TABLET | Freq: Two times a day (BID) | ORAL | Status: DC
  Administered 2023-12-01 – 2023-12-11 (×20): 90 mg via ORAL
  Filled 2023-12-01 (×20): qty 1

## 2023-12-01 MED ORDER — SODIUM CHLORIDE 0.9 % IV SOLN: 250.0000 mL | INTRAVENOUS | Status: AC

## 2023-12-01 MED ORDER — TICAGRELOR 90 MG PO TABS: 90.0000 mg | ORAL_TABLET | Freq: Two times a day (BID) | ORAL | Status: DC

## 2023-12-01 MED ORDER — RACEPINEPHRINE HCL 2.25 % IN NEBU
0.5000 mL | INHALATION_SOLUTION | Freq: Once | RESPIRATORY_TRACT | Status: AC
  Administered 2023-12-01: 0.5 mL via RESPIRATORY_TRACT

## 2023-12-01 MED ORDER — DOCUSATE SODIUM 50 MG/5ML PO LIQD
100.0000 mg | Freq: Two times a day (BID) | ORAL | Status: DC
  Filled 2023-12-01 (×2): qty 10

## 2023-12-01 MED ORDER — PERFLUTREN LIPID MICROSPHERE
1.0000 mL | INTRAVENOUS | Status: AC | PRN
  Administered 2023-12-01: 1 mL via INTRAVENOUS

## 2023-12-01 NOTE — Progress Notes (Signed)
 PT Cancellation Note  Patient Details Name: Jewett Mcgann MRN: [id number] DOB: [birth year]   Cancelled Treatment:    Reason Eval/Treat Not Completed: Medical issues which prohibited therapy - weaning off vent presently, will hold PT until extubation given weaning today.   Jameil Whitmoyer S, PT DPT Acute Rehabilitation Services Secure Chat Preferred  Office [phone]    Antwion Carpenter E Stroup 12/01/2023, 1:34 PM

## 2023-12-01 NOTE — Consult Note (Signed)
 NAME:  Ian Wong, MRN:  [id number], DOB:  1960/12/13, LOS: 1 ADMISSION DATE:  11/30/2023, CONSULTATION DATE:  11/30/23 REFERRING MD:  everitt Sarks Rodgriguez CHIEF COMPLAINT:  L weakness   History of Present Illness:  Pt is encephelopathic; therefore, this HPI is obtained from chart review.Ian Wong is a 62 y.o. male who has a PMH as below and who presented to [facility] ED 12/30 with R gaze deviation and L hemiplegia. He was last known well around 12:45PM that day. CTH demonstrated ASPECTS 10 with no large territory infarct or ICH. CTA demonstrated occlusion of R ICA with distal intracranial reconstitution and occlusion of R MCA. He was given TNKase  and transferred to [facility] for neuro IR intervention.  Upon arrival to [facility], he was taken to IR where he had diagnostic cerebral angiogram with mechanical thrombectomy, right carotid stenting and angioplasty with cerebral protection device.  While getting him intubated for IR procedure, he had a vomiting episode and there was concern for aspiration; therefore, post procedure, anesthesia opted to leave him intubated. PCCM was subsequently called for assistance with ventilator management.  Pertinent  Medical History:  has Cellulitis; Cellulitis of left lower extremity; PAD (peripheral artery disease) (HCC); Critical lower limb ischemia (HCC); Leriche syndrome (HCC); Aortoiliac occlusive disease (HCC); Incisional hernia, without obstruction or gangrene; Acute ischemic right MCA stroke (HCC); Acute right MCA stroke (HCC); Acute hypoxic respiratory failure (HCC); Acute metabolic encephalopathy; and Aspiration pneumonia (HCC) on their problem list.  Significant Hospital Events: Including procedures, antibiotic start and stop dates in addition to other pertinent events   12/30 to [facility] for diagnostic cerebral angiogram with mechanical thrombectomy, right carotid stenting and angioplasty with cerebral protection device, remained intubated post procedure. Head CT  12/30 (post procedure) > no abnormality, chronic small vessel disease.  Exam quality poor due to streak artifact  Interim History / Subjective:  Head CT as above Fentanyl  50, propofol  20 Phenylephrine  started, currently 50   Objective:  Blood pressure (!) 135/50, pulse (!) 52, temperature 98.8 F (37.1 C), temperature source Axillary, resp. rate 18, height 5' 11 (1.803 m), weight 96.8 kg, SpO2 100%.    Vent Mode: PRVC FiO2 (%):  [40 %-100 %] 40 % Set Rate:  [18 bmp] 18 bmp Vt Set:  [600 mL] 600 mL PEEP:  [5 cmH20] 5 cmH20 Plateau Pressure:  [10 cmH20-16 cmH20] 16 cmH20   Intake/Output Summary (Last 24 hours) at 12/01/2023 0752 Last data filed at 12/01/2023 0700 Gross per 24 hour  Intake 2934.42 ml  Output 1875 ml  Net 1059.42 ml   Filed Weights   11/30/23 1442 11/30/23 1445  Weight: 99.5 kg 96.8 kg    Examination: General: Comfortable laying in bed no distress Neuro: Intubated and sedated.  Does wake to voice, track and follow commands.  Still apneic (sedation just lifted) HEENT: ET tube in good position, no oral lesions Cardiovascular: Regular, no murmur, 65 Lungs: Clear bilaterally Abdomen: Soft, nondistended with positive bowel sounds Musculoskeletal: No edema Skin: No rash  Chest x-ray 12/31 reviewed by me showed no infiltrates, ET tube at the level of clavicles  Labs/imaging personally reviewed:  CTH/CTA head 12/30 > SPECTS 10 with no large territory infarct or ICH. CTA demonstrated occlusion of R ICA with distal intracranial reconstitution and occlusion of R MCA. CTH 12/30 > no abnormality MRI brain 12/31 >  Echo 12/31 >  Assessment & Plan:   R ICA and R MCA occlusion - s/p TNKase  followed by diagnostic cerebral angiogram with  mechanical thrombectomy, right carotid stenting and angioplasty with cerebral protection device. -Postprocedure management as per IR -MRI brain and echocardiogram ordered and pending -Start antiplatelet regimen as per neurology plans  following cangrelor  -Statin as ordered -PT/OT/SLP when able to participate  Hypertension, now hypotension on sedation -Wean phenylephrine  as able -Cleviprex  available for SBP goal 120-140 if rebound once sedation lightened  Respiratory insufficiency - with concern for possible aspiration after vomiting episode event during intubation -PRVC 8 cc/kg -Sedation as per PAD protocol -Will assess for wake up and SBT once imaging has been performed, okay with neurology -On empiric Unasyn  after episode of emesis.  Chest x-ray 12/31 is reassuring.  Low threshold to discontinue -Push pulmonary hygiene -VAP prevention order set  Hx HLD. -Continue statin  Hx Hypothyroidism. -Continue levothyroxine   At risk malnutrition -Will initiate tube feeding if he is not going to be quickly extubated  Best practice (evaluated daily):  Diet/type: NPO DVT prophylaxis: SCD Pressure ulcer(s): pressure ulcer assessment deferred  GI prophylaxis: PPI Lines: N/A Foley:  N/A Code Status:  full code Last date of multidisciplinary goals of care discussion: None yet.  Labs   CBC: Recent Labs  Lab 11/30/23 1424 11/30/23 1430 12/01/23 0646  WBC 8.0  --  12.7*  NEUTROABS 4.5  --   --   HGB 16.4 15.0 14.6  HCT 46.8 44.0 42.7  MCV 86.8  --  89.1  PLT 222  --  199    Basic Metabolic Panel: Recent Labs  Lab 11/30/23 1424 11/30/23 1430  NA 135 139  K 3.7 3.8  CL 105 103  CO2 24  --   GLUCOSE 108* 106*  BUN 14 14  CREATININE 1.08 1.20  CALCIUM  9.0  --    GFR: Estimated Creatinine Clearance: 76.7 mL/min (by C-G formula based on SCr of 1.2 mg/dL). Recent Labs  Lab 11/30/23 1424 11/30/23 2018 12/01/23 0646  PROCALCITON  --  <0.10  --   WBC 8.0  --  12.7*    Liver Function Tests: Recent Labs  Lab 11/30/23 1424  AST 24  ALT 23  ALKPHOS 59  BILITOT 0.8  PROT 7.4  ALBUMIN  3.6   No results for input(s): LIPASE, AMYLASE in the last 168 hours. No results for input(s): AMMONIA in  the last 168 hours.  ABG    Component Value Date/Time   PHART 7.376 01/07/2017 1258   PCO2ART 42.3 01/07/2017 1258   PO2ART 80.1 (L) 01/07/2017 1258   HCO3 24.6 01/07/2017 1258   TCO2 23 11/30/2023 1430   ACIDBASEDEF 0.1 01/07/2017 1258   O2SAT 95.5 01/07/2017 1258     Coagulation Profile: Recent Labs  Lab 11/30/23 1424  INR 1.1    Cardiac Enzymes: No results for input(s): CKTOTAL, CKMB, CKMBINDEX, TROPONINI in the last 168 hours.  HbA1C: Hgb A1c MFr Bld  Date/Time Value Ref Range Status  01/03/2017 11:51 AM 5.4 4.8 - 5.6 % Final    Comment:    (NOTE)         Pre-diabetes: 5.7 - 6.4         Diabetes: >6.4         Glycemic control for adults with diabetes: <7.0     CBG: No results for input(s): GLUCAP in the last 168 hours.  Review of Systems:   Unable to obtain as pt is encephalopathic.  Past Medical History:  He,  has a past medical history of GERD (gastroesophageal reflux disease), Hypothyroidism, and Seizures (HCC).   Surgical History:  Past Surgical History:  Procedure Laterality Date   AORTA - BILATERAL FEMORAL ARTERY BYPASS GRAFT Bilateral 01/07/2017   Procedure: AORTOBIFEMORAL BYPASS GRAFT;  Surgeon: Krystal JULIANNA Doing, MD;  Location: [facility] OR;  Service: Vascular;  Laterality: Bilateral;   I & D EXTREMITY Left 11/28/2014   Procedure: IRRIGATION AND DEBRIDEMENT;  REPAIR OF CRUSH INJURY;  Surgeon: Franky Curia, MD;  Location: [facility] OR;  Service: Orthopedics;  Laterality: Left;   INCISIONAL HERNIA REPAIR N/A 10/26/2017   Procedure: HERNIA REPAIR INCISIONAL WITH MESH;  Surgeon: Mavis Anes, MD;  Location: [facility] ORS;  Service: General;  Laterality: N/A;   RADIOLOGY WITH ANESTHESIA N/A 11/30/2023   Procedure: IR WITH ANESTHESIA;  Surgeon: Radiologist, Medication, MD;  Location: [facility] OR;  Service: Radiology;  Laterality: N/A;     Social History:   reports that he quit smoking about 7 years ago. His smoking use included cigarettes. He has never used smokeless  tobacco. He reports that he does not currently use alcohol. He reports that he does not use drugs.   Family History:  His family history includes COPD in his mother; Other in his brother.   Allergies No Known Allergies   Home Medications  Prior to Admission medications   Medication Sig Start Date End Date Taking? Authorizing Provider  aspirin  EC 81 MG tablet Take 81 mg by mouth daily.    [provider]  atorvastatin  (LIPITOR) 40 MG tablet Take 20 mg every evening by mouth. 09/21/17   [provider]  esomeprazole  (NEXIUM  24HR) 20 MG capsule Take 20 mg daily at 12 noon by mouth.    [provider]  levothyroxine  (SYNTHROID , LEVOTHROID) 50 MCG tablet Take 50 mcg by mouth daily. 11/20/16   [provider]  naproxen sodium (ALEVE) 220 MG tablet Take 660-880 mg 2 (two) times daily as needed by mouth (for pain/headaches).     [provider]     Critical care time: 34 min.    Ian Chris, MD, PhD 12/01/2023, 7:52 AM [facility] [phone] or if no answer before 7:00PM call [phone] For any issues after 7:00PM please call eLink [phone]

## 2023-12-01 NOTE — TOC CM/SW Note (Signed)
 Transition of Care [facility]) - Inpatient Brief Assessment   Patient Details  Name: Ian Wong MRN: [id number] Date of Birth: 1961-08-09  Transition of Care [facility]) CM/SW Contact:    Inocente GORMAN Kindle, LCSW Phone Number: 12/01/2023, 2:38 PM   Clinical Narrative: Patient admitted from home with spouse and have questions about insurance as patient does not have any. CSW sent request to Financial Counseling to review.    Transition of Care Asessment: Insurance and Status: Selfpay Patient has primary care physician: No Home environment has been reviewed: From home Prior level of function:: independent Prior/Current Home Services: No current home services Social Drivers of Health Review: SDOH reviewed no interventions necessary Readmission risk has been reviewed: No Transition of care needs: transition of care needs identified, TOC will continue to follow

## 2023-12-01 NOTE — Progress Notes (Addendum)
 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 Persistent occlusion of the right ICA at the terminus. Mechanical thrombectomy performed with direct contact aspiration achieving complete recanalization with Dr. MARLA. de Nile Erichsen  Subjective:  Unable to assess. Patient remains intubated  Allergies: Patient has no known allergies.  Medications: Prior to Admission medications   Medication Sig Start Date End Date Taking? Authorizing Provider  aspirin  EC 81 MG tablet Take 81 mg by mouth daily.   Yes [provider]  atorvastatin  (LIPITOR) 20 MG tablet Take 20 mg by mouth every evening. 09/21/17  Yes [provider]  esomeprazole  (NEXIUM  24HR) 20 MG capsule Take 20 mg daily at 12 noon by mouth.   Yes [provider]  ibuprofen (ADVIL) 800 MG tablet Take 800 mg by mouth every 8 (eight) hours as needed for moderate pain (pain score 4-6). 11/25/23  Yes [provider]  levothyroxine  (SYNTHROID , LEVOTHROID) 50 MCG tablet Take 50 mcg by mouth daily. 11/20/16  Yes [provider]  Omega-3 Fatty Acids (OMEGA 3 PO) Take 1 g by mouth 2 (two) times daily.   Yes [provider]     Vital Signs: BP 116/68   Pulse (!) 56   Temp 98.7 F (37.1 C) (Oral)   Resp 17   Ht 5' 11 (1.803 m)   Wt 213 lb 4.8 oz (96.8 kg)   SpO2 100%   BMI 29.75 kg/m   Physical Exam Vitals and nursing note reviewed.  Constitutional:      Appearance: He is well-developed.  HENT:     Head: Normocephalic.  Cardiovascular:     Rate and Rhythm: Regular rhythm. Bradycardia present.  Pulmonary:     Comments: Patient remains on ventilator Musculoskeletal:        General: Normal range of motion.     Cervical back: Normal range of motion.  Skin:    General: Skin is warm and dry.  Neurological:      Mental Status: He is alert.     Comments:  Able to follow commands and move RUE and LLEE extremities against gravity. Only weak grip of LUE with no anti gravity movement.     Imaging: ECHOCARDIOGRAM COMPLETE Result Date: 12/01/2023    ECHOCARDIOGRAM REPORT   Patient Name:   Ian Wong Date of Exam: 12/01/2023 Medical Rec #:  [id number]         Height:       71.0 in Accession #:    [id number]        Weight:       213.3 lb Date of Birth:  1961/06/06         BSA:          2.167 m Patient Age:    62 years          BP:           124/51 mmHg Patient Gender: M                 HR:           54 bpm. Exam Location:  Inpatient Procedure: 2D Echo, Cardiac Doppler and Color Doppler Indications:    Stroke  History:        Patient has prior history of Echocardiogram examinations, most  recent 01/04/2017. PAD and Stroke.  Sonographer:    Lanell Maduro Referring Phys: SALMAN KHALIQDINA IMPRESSIONS  1. Technically difficult study with very limited visualization of cardiac structures.  2. Left ventricular ejection fraction, by estimation, is 50 to 55%. The left ventricle has normal function. The left ventricle has no regional wall motion abnormalities. Left ventricular diastolic parameters were normal.  3. Right ventricular systolic function is normal. The right ventricular size is normal.  4. The mitral valve is grossly normal. No evidence of mitral valve regurgitation.  5. The aortic valve was not well visualized. Aortic valve regurgitation is not visualized.  6. The inferior vena cava is dilated in size with <50% respiratory variability, suggesting right atrial pressure of 15 mmHg. FINDINGS  Left Ventricle: Left ventricular ejection fraction, by estimation, is 50 to 55%. The left ventricle has normal function. The left ventricle has no regional wall motion abnormalities. The left ventricular internal cavity size was normal in size. There is  no left ventricular hypertrophy. Left ventricular diastolic  parameters were normal. Right Ventricle: The right ventricular size is normal. No increase in right ventricular wall thickness. Right ventricular systolic function is normal. Left Atrium: Left atrial size was normal in size. Right Atrium: Right atrial size was normal in size. Pericardium: There is no evidence of pericardial effusion. Mitral Valve: The mitral valve is grossly normal. No evidence of mitral valve regurgitation. Tricuspid Valve: The tricuspid valve is not well visualized. Tricuspid valve regurgitation is trivial. Aortic Valve: The aortic valve was not well visualized. Aortic valve regurgitation is not visualized. Pulmonic Valve: The pulmonic valve was not assessed. Pulmonic valve regurgitation is not visualized. Aorta: The aortic root was not well visualized. Venous: The inferior vena cava is dilated in size with less than 50% respiratory variability, suggesting right atrial pressure of 15 mmHg. IAS/Shunts: The interatrial septum was not well visualized.  LEFT VENTRICLE PLAX 2D LVIDd:         4.40 cm      Diastology LVIDs:         3.70 cm      LV e' medial:    10.30 cm/s LV PW:         0.80 cm      LV E/e' medial:  8.0 LV IVS:        0.70 cm      LV e' lateral:   12.70 cm/s LVOT diam:     2.20 cm      LV E/e' lateral: 6.5 LV SV:         63 LV SV Index:   29 LVOT Area:     3.80 cm  LV Volumes (MOD) LV vol d, MOD A2C: 88.3 ml LV vol d, MOD A4C: 152.0 ml LV vol s, MOD A2C: 44.0 ml LV vol s, MOD A4C: 72.5 ml LV SV MOD A2C:     44.3 ml LV SV MOD A4C:     152.0 ml LV SV MOD BP:      61.7 ml RIGHT VENTRICLE             IVC RV Basal diam:  4.30 cm     IVC diam: 2.30 cm RV Mid diam:    2.90 cm RV S prime:     10.40 cm/s TAPSE (M-mode): 2.1 cm LEFT ATRIUM           Index        RIGHT ATRIUM          Index LA  diam:      2.70 cm 1.25 cm/m   RA Area:     9.40 cm LA Vol (A2C): 13.0 ml 6.00 ml/m   RA Volume:   16.90 ml 7.80 ml/m LA Vol (A4C): 33.2 ml 15.32 ml/m  AORTIC VALVE LVOT Vmax:   75.70 cm/s LVOT Vmean:   48.700 cm/s LVOT VTI:    0.166 m MITRAL VALVE MV Area (PHT): 5.38 cm    SHUNTS MV Decel Time: 141 msec    Systemic VTI:  0.17 m MV E velocity: 82.10 cm/s  Systemic Diam: 2.20 cm MV A velocity: 83.70 cm/s MV E/A ratio:  0.98 Aditya Sabharwal Electronically signed by Ria Commander Signature Date/Time: 12/01/2023/10:36:35 AM    Final    Portable Chest xray Result Date: 12/01/2023 CLINICAL DATA:  Respiratory failure. EXAM: PORTABLE CHEST 1 VIEW COMPARISON:  January 08, 2017. FINDINGS: The heart size and mediastinal contours are within normal limits. Endotracheal and nasogastric tubes are unchanged. Minimal bibasilar subsegmental atelectasis is again noted. The visualized skeletal structures are unremarkable. IMPRESSION: Stable support apparatus. Minimal bibasilar subsegmental atelectasis. Electronically Signed   By: Lynwood Landy Raddle M.D.   On: 12/01/2023 10:05   CT HEAD WO CONTRAST ( ) Result Date: 11/30/2023 CLINICAL DATA:  Stroke follow-up, status post intervention EXAM: CT HEAD WITHOUT CONTRAST TECHNIQUE: Contiguous axial images were obtained from the base of the skull through the vertex without intravenous contrast. RADIATION DOSE REDUCTION: This exam was performed according to the departmental dose-optimization program which includes automated exposure control, adjustment of the mA and/or kV according to patient size and/or use of iterative reconstruction technique. COMPARISON:  None Available. FINDINGS: Exam quality is markedly degraded by streak artifacts from outside the field of view. Brain: There is no mass, hemorrhage or extra-axial collection. The size and configuration of the ventricles and extra-axial CSF spaces are normal. There is hypoattenuation of the white matter, most commonly indicating chronic small vessel disease. Vascular: No hyperdense vessel or unexpected vascular calcification. Skull: The visualized skull base, calvarium and extracranial soft tissues are normal. Sinuses/Orbits: No  fluid levels or advanced mucosal thickening of the visualized paranasal sinuses. No mastoid or middle ear effusion. Normal orbits. Other: None. IMPRESSION: 1. No acute intracranial abnormality. 2. Chronic small vessel disease. 3. Exam quality is markedly degraded by streak artifacts from outside the field of view. Electronically Signed   By: Franky Stanford M.D.   On: 11/30/2023 21:12   CT C-SPINE NO CHARGE Result Date: 11/30/2023 CLINICAL DATA:  Polytrauma, blunt. EXAM: CT CERVICAL SPINE WITHOUT CONTRAST TECHNIQUE: Multidetector CT imaging of the cervical spine was performed without intravenous contrast. Multiplanar CT image reconstructions were also generated. RADIATION DOSE REDUCTION: This exam was performed according to the departmental dose-optimization program which includes automated exposure control, adjustment of the mA and/or kV according to patient size and/or use of iterative reconstruction technique. COMPARISON:  CTA head/neck 11/30/2023. FINDINGS: Alignment: Normal. Skull base and vertebrae: No acute fracture. Normal craniocervical junction. No suspicious bone lesions. Soft tissues and spinal canal: No prevertebral fluid or swelling. No visible canal hematoma. Please refer to same-day CTA for vascular findings. Disc levels:  No significant degenerative change. Upper chest: No acute findings. Other: None. IMPRESSION: No acute fracture or traumatic listhesis of the cervical spine. Electronically Signed   By: Ryan Chess M.D.   On: 11/30/2023 15:28   CT HEAD CODE STROKE WO CONTRAST Result Date: 11/30/2023 CLINICAL DATA:  Code stroke. Neuro deficit, acute, stroke suspected R ICA and MCA occlusion, s/p  TNK, now with h/a. Headache after thrombolytic therapy. EXAM: CT HEAD WITHOUT CONTRAST TECHNIQUE: Contiguous axial images were obtained from the base of the skull through the vertex without intravenous contrast. RADIATION DOSE REDUCTION: This exam was performed according to the departmental  dose-optimization program which includes automated exposure control, adjustment of the mA and/or kV according to patient size and/or use of iterative reconstruction technique. COMPARISON:  Head CT and CTA head/neck 11/30/2023. FINDINGS: Brain: No acute hemorrhage. Unchanged loss of gray-white differentiation along the right insula and anterior temporal lobe. Stable background of moderate chronic small-vessel disease. No hydrocephalus or extra-axial collection. No mass effect or midline shift. Vascular: Circulating contrast from recent CTA. Skull: No calvarial fracture or suspicious bone lesion. Skull base is unremarkable. Sinuses/Orbits: No acute finding. Other: None. ASPECTS [facility] Stroke Program Early CT Score) - Ganglionic level infarction (caudate, lentiform nuclei, internal capsule, insula, M1-M3 cortex): 5 - Supraganglionic infarction (M4-M6 cortex): 3 Total score (0-10 with 10 being normal): 8 IMPRESSION: 1. Unchanged loss of gray-white differentiation along the right insula and anterior temporal lobe, compatible with acute infarct. No acute hemorrhage. 2. ASPECT score is 8. Code stroke imaging results were communicated on 11/30/2023 at 3:24 pm to provider Dr. Matthews via secure text paging. Electronically Signed   By: Ryan Chess M.D.   On: 11/30/2023 15:24   CT HEAD CODE STROKE WO CONTRAST Addendum Date: 11/30/2023 ADDENDUM REPORT: 11/30/2023 14:57 ADDENDUM: Findings discussed with Dr. Matthews via telephone at 2:52 p.m. Electronically Signed   By: Gilmore GORMAN Molt M.D.   On: 11/30/2023 14:57   Result Date: 11/30/2023 CLINICAL DATA:  Code stroke.  Neuro deficit, acute, stroke suspected EXAM: CT HEAD WITHOUT CONTRAST TECHNIQUE: Contiguous axial images were obtained from the base of the skull through the vertex without intravenous contrast. RADIATION DOSE REDUCTION: This exam was performed according to the departmental dose-optimization program which includes automated exposure control, adjustment of  the mA and/or kV according to patient size and/or use of iterative reconstruction technique. COMPARISON:  None Available. FINDINGS: Brain: Loss of gray-white differentiation in the right insula and overlying right operculum. No evidence of acute hemorrhage, mass effect or midline shift. Additional patchy white matter hypodensities are nonspecific but compatible with chronic microvascular ischemic disease. Vascular: No hyperdense vessel identified. Skull: No acute fracture identified. Sinuses/Orbits: Clear sinuses.  No acute orbital findings. Other: No mastoid effusions. ASPECTS [facility] Stroke Program Early CT Score) Total score (0-10 with 10 being normal): 8. DR. MATTHEWS paged at the time of dictation.  Awaiting call back. IMPRESSION: 1. Hyperdense right M1 MCA, concerning for thrombus. See forthcoming CTA. 2. Findings compatible with acute right MCA territory infarct with loss of gray differentiation in the right insula and the overlying operculum. ASPECTS 8. Electronically Signed: By: Gilmore GORMAN Molt M.D. On: 11/30/2023 14:47   CT ANGIO HEAD NECK W WO CM (CODE STROKE) Result Date: 11/30/2023 CLINICAL DATA:  Neuro deficit, acute, stroke suspected R gaze L hemiplegia EXAM: CT ANGIOGRAPHY HEAD AND NECK WITH AND WITHOUT CONTRAST TECHNIQUE: Multidetector CT imaging of the head and neck was performed using the standard protocol during bolus administration of intravenous contrast. Multiplanar CT image reconstructions and MIPs were obtained to evaluate the vascular anatomy. Carotid stenosis measurements (when applicable) are obtained utilizing NASCET criteria, using the distal internal carotid diameter as the denominator. RADIATION DOSE REDUCTION: This exam was performed according to the departmental dose-optimization program which includes automated exposure control, adjustment of the mA and/or kV according to patient size and/or use of iterative  reconstruction technique. CONTRAST:  75mL OMNIPAQUE  IOHEXOL  350 MG/ML  SOLN COMPARISON:  None Available. FINDINGS: CTA NECK FINDINGS Aortic arch: Great vessel origins are patent. Aortic atherosclerosis. Right carotid system: Occluded ICA at its origin with non opacification throughout the neck. Left carotid system: Atherosclerosis at the carotid bifurcation with 70% stenosis of the ICA origin. Vertebral arteries: Right dominant. Severe right vertebral artery origin stenosis. Patent bilaterally. Skeleton: No acute abnormality on limited assessment. Other neck: No acute abnormality on limited assessment. Upper chest: Visualized lung apices are clear. Review of the MIP images confirms the above findings CTA HEAD FINDINGS Anterior circulation: The ICA is non-opacified proximally with brief reconstitution of the paraclinoid ICA. The ICAs and occluded again at the terminus with non-opacified M1 MCA and little opacification of more distal MCA vessels. Left ICA and left MCA are opacified. The left A1 ACA is hypoplastic. Right A1 ACA and more distal ACAs bilaterally are opacified (right A1 ACAs likely from retrograde flow). Posterior circulation: Small/non dominant left intradural vertebral artery with superimposed severe stenosis. Right vertebral artery is patent. Basilar artery and bilateral posterior cerebral arteries are patent without proximal high-grade stenosis. Venous sinuses: As permitted by contrast timing, patent. Review of the MIP images confirms the above findings IMPRESSION: 1. Occluded right ICA at its origin with non opacification throughout the neck. The right paraclinoid ICA briefly reconstitutes with subsequent occlusion of the right M1 MCA, concerning for thrombus given hyperdense appearance on same day CT head. 2. Severe right vertebral artery origin stenosis. 3. Severe stenosis of the intradural small/non dominant left vertebral artery. 4. Approximately 70% stenosis of the left ICA origin. Findings discussed with Dr. Matthews via telephone at 2:52 p.m. Electronically Signed    By: Gilmore GORMAN Molt M.D.   On: 11/30/2023 14:57    Labs:  CBC: Recent Labs    11/30/23 1424 11/30/23 1430 12/01/23 0646  WBC 8.0  --  12.7*  HGB 16.4 15.0 14.6  HCT 46.8 44.0 42.7  PLT 222  --  199    COAGS: Recent Labs    11/30/23 1424  INR 1.1  APTT 22*    BMP: Recent Labs    11/30/23 1424 11/30/23 1430 12/01/23 0646  NA 135 139 139  K 3.7 3.8 4.2  CL 105 103 108  CO2 24  --  22  GLUCOSE 108* 106* 99  BUN 14 14 10   CALCIUM  9.0  --  8.1*  CREATININE 1.08 1.20 1.04  GFRNONAA >60  --  >60    LIVER FUNCTION TESTS: Recent Labs    11/30/23 1424  BILITOT 0.8  AST 24  ALT 23  ALKPHOS 59  PROT 7.4  ALBUMIN  3.6    Assessment and Plan:  62 y.o. male inpatient. History of PVD, GERD, hypothyroidism, HLD, Seizures. Presented to the ED at [facility] on 12.30.24 with generalized weakness after being found on the ground by family. Found to have a right deviation and left sided weakness. CT Head from 12.30.24 reads  Hyperdense right M1 MCA, concerning for thrombus  CTA from 12.31.24 Occluded right ICA at its origin with non opacification throughout the neck. The right paraclinoid ICA briefly reconstitutes with subsequent occlusion of the right M1 MCA, concerning for thrombus given hyperdense appearance on same day CT head. A code stroke was called. TNK was started. Patient was transferred to [facility] for further evaluation and intervention. S/p Interval recanalization of the cervical right ICA with underlying severe stenosis. Persistent occlusion of the right ICA at the  terminus. Mechanical thrombectomy performed with direct contact aspiration achieving complete recanalization (TICI 3). Flat panel CT showed no hemorrhagic complicaiton. Following, cervical angioplasty and stenting was performed with cerebral protection device with no residual stenosis. Follow-up angiograms showed no thromboembolic complication with NIR Attending Dr. MARLA. de Nile Erichsen. Post procedure MR  performed showing large right MCA territory infarct.  Patient seen at bedside with Dr. MARLA. everitt Nile Erichsen. Patient remains intubated. Able to follow commands moves all 4 extremities. Right groin access site is soft with no active bleeding and no appreciable pseudoaneurysm. Dressing is C/D/I   PLAN: 3 month carotid doppler. Orders placed 3 month NIR follow up. Orders placed   IR will continue to follow along - plans per Stroke Team   Electronically Signed: Delon JAYSON Beagle, NP 12/01/2023, 1:42 PM   I spent a total of 15 Minutes at the the patient's bedside AND on the patient's hospital floor or unit, greater than 50% of which was counseling/coordinating care for S/p Interval recanalization of the cervical right ICA with underlying severe stenosis. Persistent occlusion of the right ICA at the terminus. Mechanical thrombectomy performed with direct contact aspiration achieving complete recanalization (TICI 3).

## 2023-12-01 NOTE — Procedures (Signed)
 Extubation Procedure Note  Patient Details:   Name: Ian Wong DOB: 1961/07/07 MRN: [id number]   Airway Documentation:    Vent end date: 12/01/23 Vent end time: 1355   Evaluation  O2 sats: stable throughout Complications: No apparent complications Patient did tolerate procedure well. Bilateral Breath Sounds: Stridor   Yes  Positive cuff leak noted. Patient placed on [place] 4L with humidity, patient has stridor. Dr. Shelah notified and ordered a dose of Racepinephrine. Racepi give. Family at bedside. RN aware.  Cy RAMAN Chalese Peach 12/01/2023, 2:03 PM

## 2023-12-01 NOTE — Progress Notes (Addendum)
 STROKE TEAM PROGRESS NOTE   BRIEF HPI Mr. Blaike Vickers is a 62 y.o. male with past medical history significant for seizures, hypothyroidism, GERD who presented to [name] ED 12/30 with persistent right gaze deviation and left hemiplegia.  His last known well was 1245 family did call EMS when his symptoms started.  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  Patient was given TNK at [facility] and was transferred to [facility] for emergent thrombectomy. On neurologic neurology exam on arrival to [facility] patient still had persistent left-sided weakness, right gaze, left-sided sensory deficit, left hemianopsia, left sensory neglect. He was then taken emergently to IR.  NIH on Admission: 70  SIGNIFICANT HOSPITAL EVENTS 12/30: Transferred from [name] to [facility] for emergent thrombectomy  Mechanical thrombectomy, right carotid stent and angioplasty  --> TICI 3  Left intubated postprocedure  Post procedure CT showed no hemorrhage. 12/31: MRI shows extensive acute right MCA territory infarcts wit chronic microhemorrhages versus acute petechial hemorrhages.   INTERIM HISTORY/SUBJECTIVE  Patient lying in bed, intubated.  He is able to follow commands and does track examiner.  Neurologically stable for extubation today. Patient started on Aspirin  and Brilinta .  MRI scan shows patchy extensive right MCA infarct with trace petechial hemorrhage but no cytotoxic edema or midline shift. OBJECTIVE  CBC    Component Value Date/Time   WBC 12.7 (H) 12/01/2023 0646   RBC 4.79 12/01/2023 0646   HGB 14.6 12/01/2023 0646   HCT 42.7 12/01/2023 0646   PLT 199 12/01/2023 0646   MCV 89.1 12/01/2023 0646   MCH 30.5 12/01/2023 0646   MCHC 34.2 12/01/2023 0646   RDW 13.2 12/01/2023 0646   LYMPHSABS 2.2 11/30/2023 1424   MONOABS 1.1 (H) 11/30/2023 1424   EOSABS 0.2 11/30/2023 1424   BASOSABS 0.1 11/30/2023 1424    BMET    Component Value  Date/Time   NA 139 12/01/2023 0646   K 4.2 12/01/2023 0646   CL 108 12/01/2023 0646   CO2 22 12/01/2023 0646   GLUCOSE 99 12/01/2023 0646   BUN 10 12/01/2023 0646   CREATININE 1.04 12/01/2023 0646   CALCIUM  8.1 (L) 12/01/2023 0646   GFRNONAA >60 12/01/2023 0646    IMAGING past 24 hours ECHOCARDIOGRAM COMPLETE Result Date: 12/01/2023    ECHOCARDIOGRAM REPORT   Patient Name:   Ranen Doolin Date of Exam: 12/01/2023 Medical Rec #:  [id number]         Height:       71.0 in Accession #:    [id number]        Weight:       213.3 lb Date of Birth:  [birth year]         BSA:          2.167 m Patient Age:    62 years          BP:           124/51 mmHg Patient Gender: M                 HR:           54 bpm. Exam Location:  Inpatient Procedure: 2D Echo, Cardiac Doppler and Color Doppler Indications:    Stroke  History:        Patient has prior history of Echocardiogram examinations, most                 recent  01/04/2017. PAD and Stroke.  Sonographer:    Lanell Maduro Referring Phys: SALMAN KHALIQDINA IMPRESSIONS  1. Technically difficult study with very limited visualization of cardiac structures.  2. Left ventricular ejection fraction, by estimation, is 50 to 55%. The left ventricle has normal function. The left ventricle has no regional wall motion abnormalities. Left ventricular diastolic parameters were normal.  3. Right ventricular systolic function is normal. The right ventricular size is normal.  4. The mitral valve is grossly normal. No evidence of mitral valve regurgitation.  5. The aortic valve was not well visualized. Aortic valve regurgitation is not visualized.  6. The inferior vena cava is dilated in size with <50% respiratory variability, suggesting right atrial pressure of 15 mmHg. FINDINGS  Left Ventricle: Left ventricular ejection fraction, by estimation, is 50 to 55%. The left ventricle has normal function. The left ventricle has no regional wall motion abnormalities. The left ventricular  internal cavity size was normal in size. There is  no left ventricular hypertrophy. Left ventricular diastolic parameters were normal. Right Ventricle: The right ventricular size is normal. No increase in right ventricular wall thickness. Right ventricular systolic function is normal. Left Atrium: Left atrial size was normal in size. Right Atrium: Right atrial size was normal in size. Pericardium: There is no evidence of pericardial effusion. Mitral Valve: The mitral valve is grossly normal. No evidence of mitral valve regurgitation. Tricuspid Valve: The tricuspid valve is not well visualized. Tricuspid valve regurgitation is trivial. Aortic Valve: The aortic valve was not well visualized. Aortic valve regurgitation is not visualized. Pulmonic Valve: The pulmonic valve was not assessed. Pulmonic valve regurgitation is not visualized. Aorta: The aortic root was not well visualized. Venous: The inferior vena cava is dilated in size with less than 50% respiratory variability, suggesting right atrial pressure of 15 mmHg. IAS/Shunts: The interatrial septum was not well visualized.  LEFT VENTRICLE PLAX 2D LVIDd:         4.40 cm      Diastology LVIDs:         3.70 cm      LV e' medial:    10.30 cm/s LV PW:         0.80 cm      LV E/e' medial:  8.0 LV IVS:        0.70 cm      LV e' lateral:   12.70 cm/s LVOT diam:     2.20 cm      LV E/e' lateral: 6.5 LV SV:         63 LV SV Index:   29 LVOT Area:     3.80 cm  LV Volumes (MOD) LV vol d, MOD A2C: 88.3 ml LV vol d, MOD A4C: 152.0 ml LV vol s, MOD A2C: 44.0 ml LV vol s, MOD A4C: 72.5 ml LV SV MOD A2C:     44.3 ml LV SV MOD A4C:     152.0 ml LV SV MOD BP:      61.7 ml RIGHT VENTRICLE             IVC RV Basal diam:  4.30 cm     IVC diam: 2.30 cm RV Mid diam:    2.90 cm RV S prime:     10.40 cm/s TAPSE (M-mode): 2.1 cm LEFT ATRIUM           Index        RIGHT ATRIUM          Index LA diam:  2.70 cm 1.25 cm/m   RA Area:     9.40 cm LA Vol (A2C): 13.0 ml 6.00 ml/m   RA  Volume:   16.90 ml 7.80 ml/m LA Vol (A4C): 33.2 ml 15.32 ml/m  AORTIC VALVE LVOT Vmax:   75.70 cm/s LVOT Vmean:  48.700 cm/s LVOT VTI:    0.166 m MITRAL VALVE MV Area (PHT): 5.38 cm    SHUNTS MV Decel Time: 141 msec    Systemic VTI:  0.17 m MV E velocity: 82.10 cm/s  Systemic Diam: 2.20 cm MV A velocity: 83.70 cm/s MV E/A ratio:  0.98 Aditya Sabharwal Electronically signed by Ria Commander Signature Date/Time: 12/01/2023/10:36:35 AM    Final    Portable Chest xray Result Date: 12/01/2023 CLINICAL DATA:  Respiratory failure. EXAM: PORTABLE CHEST 1 VIEW COMPARISON:  January 08, 2017. FINDINGS: The heart size and mediastinal contours are within normal limits. Endotracheal and nasogastric tubes are unchanged. Minimal bibasilar subsegmental atelectasis is again noted. The visualized skeletal structures are unremarkable. IMPRESSION: Stable support apparatus. Minimal bibasilar subsegmental atelectasis. Electronically Signed   By: Lynwood Landy Raddle M.D.   On: 12/01/2023 10:05   CT HEAD WO CONTRAST ( ) Result Date: 11/30/2023 CLINICAL DATA:  Stroke follow-up, status post intervention EXAM: CT HEAD WITHOUT CONTRAST TECHNIQUE: Contiguous axial images were obtained from the base of the skull through the vertex without intravenous contrast. RADIATION DOSE REDUCTION: This exam was performed according to the departmental dose-optimization program which includes automated exposure control, adjustment of the mA and/or kV according to patient size and/or use of iterative reconstruction technique. COMPARISON:  None Available. FINDINGS: Exam quality is markedly degraded by streak artifacts from outside the field of view. Brain: There is no mass, hemorrhage or extra-axial collection. The size and configuration of the ventricles and extra-axial CSF spaces are normal. There is hypoattenuation of the white matter, most commonly indicating chronic small vessel disease. Vascular: No hyperdense vessel or unexpected vascular  calcification. Skull: The visualized skull base, calvarium and extracranial soft tissues are normal. Sinuses/Orbits: No fluid levels or advanced mucosal thickening of the visualized paranasal sinuses. No mastoid or middle ear effusion. Normal orbits. Other: None. IMPRESSION: 1. No acute intracranial abnormality. 2. Chronic small vessel disease. 3. Exam quality is markedly degraded by streak artifacts from outside the field of view. Electronically Signed   By: Franky Stanford M.D.   On: 11/30/2023 21:12   CT C-SPINE NO CHARGE Result Date: 11/30/2023 CLINICAL DATA:  Polytrauma, blunt. EXAM: CT CERVICAL SPINE WITHOUT CONTRAST TECHNIQUE: Multidetector CT imaging of the cervical spine was performed without intravenous contrast. Multiplanar CT image reconstructions were also generated. RADIATION DOSE REDUCTION: This exam was performed according to the departmental dose-optimization program which includes automated exposure control, adjustment of the mA and/or kV according to patient size and/or use of iterative reconstruction technique. COMPARISON:  CTA head/neck 11/30/2023. FINDINGS: Alignment: Normal. Skull base and vertebrae: No acute fracture. Normal craniocervical junction. No suspicious bone lesions. Soft tissues and spinal canal: No prevertebral fluid or swelling. No visible canal hematoma. Please refer to same-day CTA for vascular findings. Disc levels:  No significant degenerative change. Upper chest: No acute findings. Other: None. IMPRESSION: No acute fracture or traumatic listhesis of the cervical spine. Electronically Signed   By: Ryan Chess M.D.   On: 11/30/2023 15:28   CT HEAD CODE STROKE WO CONTRAST Result Date: 11/30/2023 CLINICAL DATA:  Code stroke. Neuro deficit, acute, stroke suspected R ICA and MCA occlusion, s/p TNK, now with h/a. Headache after  thrombolytic therapy. EXAM: CT HEAD WITHOUT CONTRAST TECHNIQUE: Contiguous axial images were obtained from the base of the skull through the  vertex without intravenous contrast. RADIATION DOSE REDUCTION: This exam was performed according to the departmental dose-optimization program which includes automated exposure control, adjustment of the mA and/or kV according to patient size and/or use of iterative reconstruction technique. COMPARISON:  Head CT and CTA head/neck 11/30/2023. FINDINGS: Brain: No acute hemorrhage. Unchanged loss of gray-white differentiation along the right insula and anterior temporal lobe. Stable background of moderate chronic small-vessel disease. No hydrocephalus or extra-axial collection. No mass effect or midline shift. Vascular: Circulating contrast from recent CTA. Skull: No calvarial fracture or suspicious bone lesion. Skull base is unremarkable. Sinuses/Orbits: No acute finding. Other: None. ASPECTS [facility] Stroke Program Early CT Score) - Ganglionic level infarction (caudate, lentiform nuclei, internal capsule, insula, M1-M3 cortex): 5 - Supraganglionic infarction (M4-M6 cortex): 3 Total score (0-10 with 10 being normal): 8 IMPRESSION: 1. Unchanged loss of gray-white differentiation along the right insula and anterior temporal lobe, compatible with acute infarct. No acute hemorrhage. 2. ASPECT score is 8. Code stroke imaging results were communicated on 11/30/2023 at 3:24 pm to provider Dr. Matthews via secure text paging. Electronically Signed   By: Ryan Chess M.D.   On: 11/30/2023 15:24   CT HEAD CODE STROKE WO CONTRAST Addendum Date: 11/30/2023 ADDENDUM REPORT: 11/30/2023 14:57 ADDENDUM: Findings discussed with Dr. Matthews via telephone at 2:52 p.m. Electronically Signed   By: Gilmore GORMAN Molt M.D.   On: 11/30/2023 14:57   Result Date: 11/30/2023 CLINICAL DATA:  Code stroke.  Neuro deficit, acute, stroke suspected EXAM: CT HEAD WITHOUT CONTRAST TECHNIQUE: Contiguous axial images were obtained from the base of the skull through the vertex without intravenous contrast. RADIATION DOSE REDUCTION: This exam was  performed according to the departmental dose-optimization program which includes automated exposure control, adjustment of the mA and/or kV according to patient size and/or use of iterative reconstruction technique. COMPARISON:  None Available. FINDINGS: Brain: Loss of gray-white differentiation in the right insula and overlying right operculum. No evidence of acute hemorrhage, mass effect or midline shift. Additional patchy white matter hypodensities are nonspecific but compatible with chronic microvascular ischemic disease. Vascular: No hyperdense vessel identified. Skull: No acute fracture identified. Sinuses/Orbits: Clear sinuses.  No acute orbital findings. Other: No mastoid effusions. ASPECTS [facility] Stroke Program Early CT Score) Total score (0-10 with 10 being normal): 8. DR. MATTHEWS paged at the time of dictation.  Awaiting call back. IMPRESSION: 1. Hyperdense right M1 MCA, concerning for thrombus. See forthcoming CTA. 2. Findings compatible with acute right MCA territory infarct with loss of gray differentiation in the right insula and the overlying operculum. ASPECTS 8. Electronically Signed: By: Gilmore GORMAN Molt M.D. On: 11/30/2023 14:47   CT ANGIO HEAD NECK W WO CM (CODE STROKE) Result Date: 11/30/2023 CLINICAL DATA:  Neuro deficit, acute, stroke suspected R gaze L hemiplegia EXAM: CT ANGIOGRAPHY HEAD AND NECK WITH AND WITHOUT CONTRAST TECHNIQUE: Multidetector CT imaging of the head and neck was performed using the standard protocol during bolus administration of intravenous contrast. Multiplanar CT image reconstructions and MIPs were obtained to evaluate the vascular anatomy. Carotid stenosis measurements (when applicable) are obtained utilizing NASCET criteria, using the distal internal carotid diameter as the denominator. RADIATION DOSE REDUCTION: This exam was performed according to the departmental dose-optimization program which includes automated exposure control, adjustment of the mA and/or  kV according to patient size and/or use of iterative reconstruction technique. CONTRAST:  75mL  OMNIPAQUE  IOHEXOL  350 MG/ML SOLN COMPARISON:  None Available. FINDINGS: CTA NECK FINDINGS Aortic arch: Great vessel origins are patent. Aortic atherosclerosis. Right carotid system: Occluded ICA at its origin with non opacification throughout the neck. Left carotid system: Atherosclerosis at the carotid bifurcation with 70% stenosis of the ICA origin. Vertebral arteries: Right dominant. Severe right vertebral artery origin stenosis. Patent bilaterally. Skeleton: No acute abnormality on limited assessment. Other neck: No acute abnormality on limited assessment. Upper chest: Visualized lung apices are clear. Review of the MIP images confirms the above findings CTA HEAD FINDINGS Anterior circulation: The ICA is non-opacified proximally with brief reconstitution of the paraclinoid ICA. The ICAs and occluded again at the terminus with non-opacified M1 MCA and little opacification of more distal MCA vessels. Left ICA and left MCA are opacified. The left A1 ACA is hypoplastic. Right A1 ACA and more distal ACAs bilaterally are opacified (right A1 ACAs likely from retrograde flow). Posterior circulation: Small/non dominant left intradural vertebral artery with superimposed severe stenosis. Right vertebral artery is patent. Basilar artery and bilateral posterior cerebral arteries are patent without proximal high-grade stenosis. Venous sinuses: As permitted by contrast timing, patent. Review of the MIP images confirms the above findings IMPRESSION: 1. Occluded right ICA at its origin with non opacification throughout the neck. The right paraclinoid ICA briefly reconstitutes with subsequent occlusion of the right M1 MCA, concerning for thrombus given hyperdense appearance on same day CT head. 2. Severe right vertebral artery origin stenosis. 3. Severe stenosis of the intradural small/non dominant left vertebral artery. 4.  Approximately 70% stenosis of the left ICA origin. Findings discussed with Dr. Matthews via telephone at 2:52 p.m. Electronically Signed   By: Gilmore GORMAN Molt M.D.   On: 11/30/2023 14:57    Vitals:   12/01/23 1212 12/01/23 1215 12/01/23 1316 12/01/23 1358  BP:  134/81 116/68 (!) 133/59  Pulse: (!) 54 69 (!) 56 (!) 57  Resp: 17 15 17 17   Temp:      TempSrc:      SpO2: 100% 100% 100% 100%  Weight:      Height:         PHYSICAL EXAM General: Appears critically ill, mechanically ventilated CV: Regular rate and rhythm on monitor Respiratory: Mechanically ventilated, SBT   NEURO:  Intubated.  Low-dose sedation. Speech/Language: Unable to assess due to ET tube   Patient opens eyes spontaneously, follows command, tracks examiner.  Spontaneous movement of right side seen.  Withdrawal only seen of left arm.  Able to move left lower extremity with painful stimuli  Most Recent NIH: 11   ASSESSMENT/PLAN  Acute Ischemic Infarct:  right ICA and MCA distribution s/p mechanical thrombectomy and TNK with TICI 3 revascularization with use of rescue right carotid stent Etiology:  large vessel disease, with Right ICA and MCA occlusion  Code Stroke CT head  negative for a large hypodensity concerning for a large territory infarct or hyperdensity concerning for an ICH  CTA head & neck  R ICA occlusion with distal intracranial reconstitution before occlusion of the proximal right MCA M1 with poor opacification of the distal vessels.  Post IR CT: No hemorrhage MRI:  Fairly extensive acute right MCA territory infarcts Chronic microhemorrhages versus acute petechial hemorrhages seen along the right basal ganglia Chronic lacunar infarct within the left thalamus  Carotid Doppler: PENDING 2D Echo: LVEF 50 to 55% LDL 57 HgbA1c No results found for requested labs within last 1095 days. VTE prophylaxis - SCDs aspirin  81 mg daily prior  to admission, now on aspirin  81 mg daily and Brilinta  (ticagrelor )  90 mg bid  Therapy recommendations:  Pending Disposition:  pending  Hypertension Home meds:  none Stable Cleviprex  gtt if needed, Labetalol  IV PRN Blood Pressure Goal: SBP 120-160 for first 24 hours then less than 180  Avoid hypotension  Hyperlipidemia Home meds: Lipitor 20 mg, resumed in hospital LDL 57, goal < 70 Continue statin at discharge  Diabetes type II Home meds:  none HgbA1c No results found for requested labs within last 1095 days., goal < 7.0 CBGs SSI  Tobacco Abuse Former Cigarette Smoker  Dysphagia Patient has post-stroke dysphagia, SLP consulted    Diet   Diet NPO time specified   Advance diet as tolerated  Other Stroke Risk Factors Obesity, Body mass index is 29.75 kg/m., BMI >/= 30 associated with increased stroke risk, recommend weight loss, diet and exercise as appropriate    Other Active Problems Hypothyroidism Hx of Seizures, no AED at home  Hospital day # 1   Pt seen by Neuro NP/APP and later by MD. Note/plan to be edited by MD as needed.    Rocky JAYSON Likes, DNP, AGACNP-BC [facility] Please use AMION for contact information & EPIC for messaging.  STROKE MD NOTE :  I have personally obtained history,examined this patient, reviewed notes, independently viewed imaging studies, participated in medical decision making and plan of care.ROS completed by me personally and pertinent positives fully documented  I have made any additions or clarifications directly to the above note. Agree with note above.  Patient presented with left hemiplegia due to right carotid and MCA occlusion and was treated with thrombolysis with IV TNK followed by successful mechanical thrombectomy with goal requiring rescue right carotid stenting.  Neurological exam shows shows improvement but with persistent left gaze deviation and left arm greater than leg weakness.  Continue close neurological observation and strict blood pressure control as per post TNK and  thrombectomy protocols.  Wean off sedation and extubate as tolerated.  Continue aspirin  and Brilinta  for carotid stent.  Long discussion with patient and sister at the bedside and answered questions.  Discussed with Dr. Shelah critical care medicine This patient is critically ill and at significant risk of neurological worsening, death and care requires constant monitoring of vital signs, hemodynamics,respiratory and cardiac monitoring, extensive review of multiple databases, frequent neurological assessment, discussion with family, other specialists and medical decision making of high complexity.I have made any additions or clarifications directly to the above note.This critical care time does not reflect procedure time, or teaching time or supervisory time of PA/NP/Med Resident etc but could involve care discussion time.  I spent 30 minutes of neurocritical care time  in the care of  this patient.     Eather Popp, MD Medical Director [facility] Pager: [phone] 12/01/2023 3:40 PM  To contact Stroke Continuity provider, please refer to [email or web address]. After hours, contact General Neurology

## 2023-12-01 NOTE — Progress Notes (Signed)
Patient transported to MRI and back. No complications notes. Vitals stable throughout.

## 2023-12-01 NOTE — Progress Notes (Signed)
 Date and time results received: 12/01/23 1450 (use smartphrase .now to insert current time)  Test: MRI Critical Value: IMPRESSION: 1. Fairly extensive acute right middle cerebral artery territory infarcts. There are a few superimposed small foci of susceptibility-weighted signal loss within the right basal ganglia and within/along the right insula, which may reflect chronic microhemorrhages or acute petechial hemorrhage (Heidelberg class 1a, type HI1 scattered small petechiae, no mass effect). Consider a non-contrast head CT for further evaluation. 2. Few nonspecific punctate chronic microhemorrhages scattered elsewhere within the supratentorial brain. 3. Background moderate cerebral white matter chronic small vessel ischemic disease. 4. Chronic lacunar infarct within the left thalamus.    Name of Provider Notified: Rosemarie, MD  Orders Received? Or Actions Taken?:

## 2023-12-01 NOTE — Progress Notes (Signed)
 OT Cancellation Note  Patient Details Name: Abron Neddo MRN: [id number] DOB: 1961-04-04   Cancelled Treatment:    Reason Eval/Treat Not Completed: Patient not medically ready Pt currently weaning from vent today. Will hold OT evaluation until tomorrow and complete if pt is able to participate.   Leita Howell, OTR/L,CBIS  Supplemental OT - [facility] and [facility] Secure Chat Preferred   12/01/2023, 1:49 PM

## 2023-12-01 NOTE — Progress Notes (Signed)
 SLP Cancellation Note  Patient Details Name: Ian Wong MRN: [id number] DOB: 1961/04/30   Cancelled treatment:       Reason Eval/Treat Not Completed: Patient not medically ready. Patient remains intubated. SLP will follow for readiness.  Norleen IVAR Blase, MA, CCC-SLP Speech Therapy 12/01/2023, 9:32 AM

## 2023-12-02 ENCOUNTER — Inpatient Hospital Stay (HOSPITAL_COMMUNITY): Payer: Medicaid Other

## 2023-12-02 DIAGNOSIS — I63511 Cerebral infarction due to unspecified occlusion or stenosis of right middle cerebral artery: Secondary | ICD-10-CM

## 2023-12-02 LAB — CBC
HCT: 38.8 % — ABNORMAL LOW (ref 39.0–52.0)
Hemoglobin: 13.6 g/dL (ref 13.0–17.0)
MCH: 30.7 pg (ref 26.0–34.0)
MCHC: 35.1 g/dL (ref 30.0–36.0)
MCV: 87.6 fL (ref 80.0–100.0)
Platelets: 173 10*3/uL (ref 150–400)
RBC: 4.43 MIL/uL (ref 4.22–5.81)
RDW: 13.1 % (ref 11.5–15.5)
WBC: 13 10*3/uL — ABNORMAL HIGH (ref 4.0–10.5)
nRBC: 0 % (ref 0.0–0.2)

## 2023-12-02 LAB — BASIC METABOLIC PANEL
Anion gap: 7 (ref 5–15)
BUN: 10 mg/dL (ref 8–23)
CO2: 21 mmol/L — ABNORMAL LOW (ref 22–32)
Calcium: 8 mg/dL — ABNORMAL LOW (ref 8.9–10.3)
Chloride: 109 mmol/L (ref 98–111)
Creatinine, Ser: 0.93 mg/dL (ref 0.61–1.24)
GFR, Estimated: >60 mL/min (ref >60)
Glucose, Bld: 106 mg/dL — ABNORMAL HIGH (ref 70–99)
Potassium: 3.7 mmol/L (ref 3.5–5.1)
Sodium: 137 mmol/L (ref 135–145)

## 2023-12-02 LAB — SODIUM
Sodium: 140 mmol/L (ref 135–145)
Sodium: 141 mmol/L (ref 135–145)

## 2023-12-02 LAB — POCT I-STAT 7, (LYTES, BLD GAS, ICA,H+H)
Acid-base deficit: 3 mmol/L — ABNORMAL HIGH (ref 0.0–2.0)
Bicarbonate: 21.4 mmol/L (ref 20.0–28.0)
Calcium, Ion: 1.27 mmol/L (ref 1.15–1.40)
HCT: 34 % — ABNORMAL LOW (ref 39.0–52.0)
Hemoglobin: 11.6 g/dL — ABNORMAL LOW (ref 13.0–17.0)
O2 Saturation: 97 %
Patient temperature: 99.8
Potassium: 3.6 mmol/L (ref 3.5–5.1)
Sodium: 137 mmol/L (ref 135–145)
TCO2: 22 mmol/L (ref 22–32)
pCO2 arterial: 35.6 mm[Hg] (ref 32–48)
pH, Arterial: 7.39 (ref 7.35–7.45)
pO2, Arterial: 98 mm[Hg] (ref 83–108)

## 2023-12-02 LAB — MAGNESIUM: Magnesium: 2 mg/dL (ref 1.7–2.4)

## 2023-12-02 LAB — CBG MONITORING, ED: Glucose-Capillary: 116 mg/dL — ABNORMAL HIGH (ref 70–99)

## 2023-12-02 MED ORDER — SODIUM CHLORIDE 3 % IV SOLN
INTRAVENOUS | Status: DC
  Filled 2023-12-02 (×2): qty 500

## 2023-12-02 MED ORDER — LEVOTHYROXINE SODIUM 50 MCG PO TABS
50.0000 ug | ORAL_TABLET | Freq: Every day | ORAL | Status: DC
  Administered 2023-12-03 – 2023-12-11 (×8): 50 ug via ORAL
  Filled 2023-12-02 (×8): qty 1

## 2023-12-02 MED ORDER — ATORVASTATIN CALCIUM 10 MG PO TABS
20.0000 mg | ORAL_TABLET | Freq: Every evening | ORAL | Status: DC
  Administered 2023-12-03 – 2023-12-10 (×8): 20 mg via ORAL
  Filled 2023-12-02 (×9): qty 2

## 2023-12-02 MED ORDER — POLYETHYLENE GLYCOL 3350 17 G PO PACK: 17.0000 g | PACK | Freq: Every day | ORAL | Status: DC

## 2023-12-02 MED ORDER — ASPIRIN 81 MG PO CHEW
81.0000 mg | CHEWABLE_TABLET | Freq: Every day | ORAL | Status: DC
  Administered 2023-12-02 – 2023-12-11 (×10): 81 mg via ORAL
  Filled 2023-12-02 (×10): qty 1

## 2023-12-02 NOTE — Evaluation (Signed)
 Speech Language Pathology Evaluation Patient Details Name: Ian Wong MRN: [id number] DOB: 04-Feb-1961 Today's Date: 12/02/2023 Time: 9081-9069 SLP Time Calculation (min) (ACUTE ONLY): 12 min  Problem List:  Patient Active Problem List   Diagnosis Date Noted   Acute ischemic right MCA stroke (HCC) 11/30/2023   Acute right MCA stroke (HCC) 11/30/2023   Acute hypoxic respiratory failure (HCC) 11/30/2023   Acute metabolic encephalopathy 11/30/2023   Aspiration pneumonia (HCC) 11/30/2023   Incisional hernia, without obstruction or gangrene    Aortoiliac occlusive disease (HCC) 01/07/2017   Leriche syndrome (HCC)    PAD (peripheral artery disease) (HCC) 01/03/2017   Critical lower limb ischemia (HCC) 01/03/2017   Cellulitis 12/29/2016   Cellulitis of left lower extremity    Past Medical History:  Past Medical History:  Diagnosis Date   GERD (gastroesophageal reflux disease)    Hypothyroidism    Seizures (HCC)    Past Surgical History:  Past Surgical History:  Procedure Laterality Date   AORTA - BILATERAL FEMORAL ARTERY BYPASS GRAFT Bilateral 01/07/2017   Procedure: AORTOBIFEMORAL BYPASS GRAFT;  Surgeon: Krystal JULIANNA Doing, MD;  Location: [facility] OR;  Service: Vascular;  Laterality: Bilateral;   I & D EXTREMITY Left 11/28/2014   Procedure: IRRIGATION AND DEBRIDEMENT;  REPAIR OF CRUSH INJURY;  Surgeon: Franky Curia, MD;  Location: [facility] OR;  Service: Orthopedics;  Laterality: Left;   INCISIONAL HERNIA REPAIR N/A 10/26/2017   Procedure: HERNIA REPAIR INCISIONAL WITH MESH;  Surgeon: Mavis Anes, MD;  Location: [facility] ORS;  Service: General;  Laterality: N/A;   RADIOLOGY WITH ANESTHESIA N/A 11/30/2023   Procedure: IR WITH ANESTHESIA;  Surgeon: Radiologist, Medication, MD;  Location: [facility] OR;  Service: Radiology;  Laterality: N/A;   HPI:  63 yo male transfer from [facility] with L side weakness and R gaze deviation s/p TNK and thrombectomy 12/30. There was some concern for aspiration after vomiting episode  during intubation so was kept on vent until 12/31. 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 PMH includes: GERD, hypothyroidism, seizures   Assessment / Plan / Recommendation Clinical Impression  Pt is lethargic but follows most commands when alert. Mod cues were given for sustained attention though. He is oriented x4 but has very limited intellectual awareness of his acute changes. His speech is moderately dysarthric, impacted by imprecise articulation as well as reduced breath support, volume, and dysphonia, some of which could be related to recent intubation. PTA he says he was independent, living at home with family and working as a curator. He would benefit from ongoing SLP f/u for cognition and communication.    SLP Assessment  SLP Recommendation/Assessment: Patient needs continued Speech Lanaguage Pathology Services SLP Visit Diagnosis: Dysarthria and anarthria (R47.1);Cognitive communication deficit (R41.841)    Recommendations for follow up therapy are one component of a multi-disciplinary discharge planning process, led by the attending physician.  Recommendations may be updated based on patient status, additional functional criteria and insurance authorization.    Follow Up Recommendations  Acute inpatient rehab (3hours/day)    Assistance Recommended at Discharge  Frequent or constant Supervision/Assistance  Functional Status Assessment Patient has had a recent decline in their functional status and demonstrates the ability to make significant improvements in function in a reasonable and predictable amount of time.  Frequency and Duration min 2x/week  2 weeks      SLP Evaluation Cognition  Overall Cognitive Status: Impaired/Different from baseline Arousal/Alertness: Lethargic Orientation Level: Oriented X4 Attention: Sustained Sustained Attention: Impaired Sustained Attention Impairment: Verbal basic;Functional  basic Awareness: Impaired Awareness Impairment:  Intellectual impairment Problem Solving: Impaired Problem Solving Impairment: Verbal complex Safety/Judgment: Impaired       Comprehension  Auditory Comprehension Overall Auditory Comprehension: Impaired Commands: Impaired One Step Basic Commands: 75-100% accurate Interfering Components: Attention;Other (comment) (lethargy)    Expression Expression Primary Mode of Expression: Verbal Verbal Expression Overall Verbal Expression: Appears within functional limits for tasks assessed   Oral / Motor  Oral Motor/Sensory Function Overall Oral Motor/Sensory Function: Mild impairment (following some commands, although a little sluggishly and not all) Facial ROM: Reduced left;Suspected CN VII (facial) dysfunction Facial Symmetry: Abnormal symmetry left;Suspected CN VII (facial) dysfunction Facial Strength: Reduced left;Suspected CN VII (facial) dysfunction Motor Speech Overall Motor Speech: Impaired Respiration: Impaired Level of Impairment: Phrase Phonation: Hoarse;Low vocal intensity Resonance: Within functional limits Articulation: Impaired Level of Impairment: Phrase Intelligibility: Intelligibility reduced Word: 75-100% accurate Phrase: 75-100% accurate Interfering Components:  (?recent intubation - brief, but could be impacting phonation) Effective Techniques: Increased vocal intensity             Leita SAILOR., M.A. CCC-SLP Acute Rehabilitation Services Office [phone]  Secure chat preferred  12/02/2023, 9:47 AM

## 2023-12-02 NOTE — Evaluation (Signed)
 Physical Therapy Evaluation Patient Details Name: Ian Wong MRN: [id number] DOB: 1961-04-10 Today's Date: 12/02/2023  History of Present Illness  63 yo male transfer from [facility] with L side weakness and R gaze deviation CT (+) R MCA 12/31 thrombectomy  extubated 12/31 PMH GERD hypothyroidism  Clinical Impression  Pt admitted with/for s/s of stroke described above.  Pt needing significant moderate assist of 2 for basic and .  Pt currently limited functionally due to the problems listed. ( See problems list.)   Pt will benefit from PT to maximize function and safety in order to get ready for next venue listed below.         If plan is discharge home, recommend the following: A lot of help with walking and/or transfers;A lot of help with bathing/dressing/bathroom;Assistance with cooking/housework;Assist for transportation;Help with stairs or ramp for entrance   Can travel by private vehicle        Equipment Recommendations Other (comment) (TBD)  Recommendations for Other Services  Rehab consult    Functional Status Assessment Patient has had a recent decline in their functional status and demonstrates the ability to make significant improvements in function in a reasonable and predictable amount of time.     Precautions / Restrictions Precautions Precautions: Fall Restrictions Weight Bearing Restrictions Per Provider Order: No      Mobility  Bed Mobility Overal bed mobility: Needs Assistance Bed Mobility: Rolling, Supine to Sit Rolling: Mod assist   Supine to sit: Mod assist     General bed mobility comments: requires (A) to transfer to the L side, pt needs help to elevate trunk from surface    Transfers Overall transfer level: Needs assistance   Transfers: Sit to/from Stand, Bed to chair/wheelchair/BSC Sit to Stand: +2 physical assistance, Max assist, From elevated surface Stand pivot transfers: +2 physical assistance, Max assist, From elevated surface          General transfer comment: pt requires (A) to swing hips and advance LLE.    Ambulation/Gait               General Gait Details: pivotal steps only  Careers Information Officer     Tilt Bed    Modified Rankin (Stroke Patients Only)       Balance Overall balance assessment: Needs assistance Sitting-balance support: Single extremity supported, Feet supported Sitting balance-Leahy Scale: Poor Sitting balance - Comments: tendency to list or push heavily to the L, cues for pt to w/shift R toward midline.  pt unable to hold R w/shift without consistent cues and loses focus with undivided attn.   Standing balance support: Single extremity supported, Bilateral upper extremity supported, During functional activity Standing balance-Leahy Scale: Poor Standing balance comment: list R , requiring external support                             Pertinent Vitals/Pain Pain Assessment Pain Assessment: Faces Faces Pain Scale: Hurts a little bit Pain Location: generalized Pain Intervention(s): Monitored during session, Repositioned    Home Living Family/patient expects to be discharged to:: Private residence Living Arrangements: Spouse/significant other Available Help at Discharge: Friend(s);Available 24 hours/day Type of Home: Mobile home Home Access: Ramped entrance       Home Layout: One level Home Equipment: None Additional Comments: wife is home with son with physical disability during the day    Prior Function Prior Level of  Function : Independent/Modified Independent;Working/employed;Driving               ADLs Comments: works on Chi[employer] Extremity Assessment Upper Extremity Assessment: Defer to OT evaluation    Lower Extremity Assessment Lower Extremity Assessment: LLE deficits/detail LLE Deficits / Details: Neglect on the L,  strength L quads 3/5, o/w 3-/5 LLE Sensation: decreased light  touch LLE Coordination: decreased fine motor    Cervical / Trunk Assessment Cervical / Trunk Assessment: Normal  Communication   Communication Communication: Difficulty communicating thoughts/reduced clarity of speech  Cognition Arousal: Obtunded Behavior During Therapy: Flat affect Overall Cognitive Status: Impaired/Different from baseline Area of Impairment: Following commands                       Following Commands: Follows one step commands with increased time, Follows one step commands inconsistently       General Comments: pt following simple commands with increased time. Pt delayed responses to questions        General Comments General comments (skin integrity, edema, etc.): RN informed blood noted on pad but none found on patient. the IR site is dry and dressed at this time. pt very wheezy breath sounds    Exercises     Assessment/Plan    PT Assessment Patient needs continued PT services  PT Problem List Decreased strength;Decreased activity tolerance;Decreased balance;Decreased mobility;Decreased coordination;Decreased cognition;Cardiopulmonary status limiting activity       PT Treatment Interventions DME instruction;Gait training;Functional mobility training;Therapeutic activities;Balance training;Neuromuscular re-education;Patient/family education    PT Goals (Current goals can be found in the Care Plan section)  Acute Rehab PT Goals Patient Stated Goal: Get me back home. PT Goal Formulation: With patient Time For Goal Achievement: 12/16/23 Potential to Achieve Goals: Good    Frequency Min 4X/week     Co-evaluation PT/OT/SLP Co-Evaluation/Treatment: Yes Reason for Co-Treatment: Necessary to address cognition/behavior during functional activity;For patient/therapist safety;To address functional/ADL transfers PT goals addressed during session: Mobility/safety with mobility OT goals addressed during session: ADL's and self-care;Proper use of  Adaptive equipment and DME;Strengthening/ROM       AM-PAC PT 6 Clicks Mobility  Outcome Measure Help needed turning from your back to your side while in a flat bed without using bedrails?: A Lot Help needed moving from lying on your back to sitting on the side of a flat bed without using bedrails?: Total Help needed moving to and from a bed to a chair (including a wheelchair)?: Total Help needed standing up from a chair using your arms (e.g., wheelchair or bedside chair)?: Total Help needed to walk in hospital room?: Total Help needed climbing 3-5 steps with a railing? : Total 6 Click Score: 7    End of Session   Activity Tolerance: Patient tolerated treatment well;Patient limited by fatigue Patient left: in chair;with call bell/phone within reach;with chair alarm set;with family/visitor present Nurse Communication: Mobility status PT Visit Diagnosis: Unsteadiness on feet (R26.81);Other abnormalities of gait and mobility (R26.89);Muscle weakness (generalized) (M62.81);Other symptoms and signs involving the nervous system (R29.898);Hemiplegia and hemiparesis Hemiplegia - Right/Left: Left Hemiplegia - dominant/non-dominant: Non-dominant Hemiplegia - caused by: Other cerebrovascular disease    Time: 8881-8844 PT Time Calculation (min) (ACUTE ONLY): 37 min   Charges:   PT Evaluation $PT Eval Moderate Complexity: 1 Mod   PT General Charges $$ ACUTE PT VISIT: 1 Visit         12/02/2023  [place] HERO., PT Acute Rehabilitation Services  [phone]  (office)  Vinie GAILS Shelanda Duvall 12/02/2023, 4:33 PM

## 2023-12-02 NOTE — Progress Notes (Signed)
 Chief Complaint: Left sided wekness.  Referring Physician(s): Elida Ross, MD.  Supervising Physician: De Macedo Rodrigues, Aden Youngman  Patient Status: [facility] - In-pt  History of Present Illness: Ian Wong is a 63 year old male who presented to [name] ED 11/30/2023 with persistent right gaze deviation and left hemiplegia; NIHSS 18.  His   past medical history significant for seizures, hypothyroidism, GERD   His last known well was 12:45 p.m. on 11/30/2023.  CT head negative large acute territory infarct, CTA showed occlusion of the right ICA with occlusion of the right MCA.  Patient was given TNK at [facility] and was transferred to [facility] for emergent thrombectomy.  Neurological exam on arrival to [facility] is unchanged.  He underwent mechanical thrombectomy and right cervical internal carotid artery angioplasty and stenting with complete revascularization of his right anterior circulation (TICI 3).  He has been started on aspirin  and Brilinta .  He was extubated yesterday.      Past Medical History:  Diagnosis Date   GERD (gastroesophageal reflux disease)    Hypothyroidism    Seizures (HCC)     Past Surgical History:  Procedure Laterality Date   AORTA - BILATERAL FEMORAL ARTERY BYPASS GRAFT Bilateral 01/07/2017   Procedure: AORTOBIFEMORAL BYPASS GRAFT;  Surgeon: Krystal JULIANNA Doing, MD;  Location: [facility] OR;  Service: Vascular;  Laterality: Bilateral;   I & D EXTREMITY Left 11/28/2014   Procedure: IRRIGATION AND DEBRIDEMENT;  REPAIR OF CRUSH INJURY;  Surgeon: Franky Curia, MD;  Location: [facility] OR;  Service: Orthopedics;  Laterality: Left;   INCISIONAL HERNIA REPAIR N/A 10/26/2017   Procedure: HERNIA REPAIR INCISIONAL WITH MESH;  Surgeon: Mavis Anes, MD;  Location: [facility] ORS;  Service: General;  Laterality: N/A;   RADIOLOGY WITH ANESTHESIA N/A 11/30/2023   Procedure: IR WITH ANESTHESIA;  Surgeon: Radiologist, Medication, MD;  Location: [facility] OR;  Service: Radiology;  Laterality:  N/A;    Allergies: Patient has no known allergies.  Medications: Prior to Admission medications   Medication Sig Start Date End Date Taking? Authorizing Provider  aspirin  EC 81 MG tablet Take 81 mg by mouth daily.   Yes [provider]  atorvastatin  (LIPITOR) 20 MG tablet Take 20 mg by mouth every evening. 09/21/17  Yes [provider]  esomeprazole  (NEXIUM  24HR) 20 MG capsule Take 20 mg daily at 12 noon by mouth.   Yes [provider]  ibuprofen (ADVIL) 800 MG tablet Take 800 mg by mouth every 8 (eight) hours as needed for moderate pain (pain score 4-6). 11/25/23  Yes [provider]  levothyroxine  (SYNTHROID , LEVOTHROID) 50 MCG tablet Take 50 mcg by mouth daily. 11/20/16  Yes [provider]  Omega-3 Fatty Acids (OMEGA 3 PO) Take 1 g by mouth 2 (two) times daily.   Yes [provider]     Family History  Problem Relation Age of Onset   COPD Mother    Other Brother     Social History   Socioeconomic History   Marital status: Married    Spouse name: Not on file   Number of children: Not on file   Years of education: Not on file   Highest education level: Not on file  Occupational History   Not on file  Tobacco Use   Smoking status: Former    Current packs/day: 0.00    Types: Cigarettes    Quit date: 12/2016    Years since quitting: 7.0   Smokeless tobacco: Never  Vaping Use   Vaping  status: Never Used  Substance and Sexual Activity   Alcohol use: Not Currently    Comment: last drank etoh 1 1/2 months ago.   Drug use: No   Sexual activity: Not on file  Other Topics Concern   Not on file  Social History Narrative   Not on file   Social Drivers of Health   Financial Resource Strain: Not on file  Food Insecurity: Not on file  Transportation Needs: Not on file  Physical Activity: Not on file  Stress: Not on file  Social Connections: Not on file     Review of Systems: A 12 point ROS discussed and pertinent  positives are indicated in the HPI above.  All other systems are negative.  Review of Systems  Vital Signs: BP (!) 121/36   Pulse (!) 45   Temp 99.8 F (37.7 C) (Axillary)   Resp (!) 22   Ht 5' 11 (1.803 m)   Wt 96.8 kg   SpO2 97%   BMI 29.75 kg/m   Physical Exam  General: Appears ill and tired, in no acute distress. CV: Regular rate and rhythm on monitor Respiratory: Regular respiratory rate without respiratory distress.  Supplemental oxygen via [place].    NEURO:  Awake, following commands without difficulty.  Speech with mild dysarthria.  Patient is oriented x3.  Minimal movement of right upper extremity. Moves right upper and bilateral lower extremities spontaneously.     Imaging: DG Chest Port 1 View Result Date: 12/02/2023 CLINICAL DATA:  Respiratory failure EXAM: PORTABLE CHEST 1 VIEW COMPARISON:  Chest radiograph dated 12/01/2023. FINDINGS: The heart size and mediastinal contours are within normal limits. There is mild left basilar atelectasis. The right lung is clear. No pleural effusion or pneumothorax. The visualized skeletal structures are unremarkable. IMPRESSION: Mild left basilar atelectasis. Electronically Signed   By: Norman Hopper M.D.   On: 12/02/2023 10:03   MR BRAIN WO CONTRAST Result Date: 12/01/2023 CLINICAL DATA:  Provided history: Stroke, follow-up. 24 hours post TNKase . EXAM: MRI HEAD WITHOUT CONTRAST TECHNIQUE: Multiplanar, multiecho pulse sequences of the brain and surrounding structures were obtained without intravenous contrast. COMPARISON:  Prior non-contrast head CT examinations 11/30/2023 and earlier. CT angiogram head/neck 11/30/2023. FINDINGS: Brain: No age advanced or lobar predominant parenchymal atrophy. Fairly extensive acute cortical/subcortical right MCA territory infarcts affecting portions of the frontal lobe/insula, temporal lobe, parietal lobe, occipital lobe and basal ganglia. There are few superimposed small foci of susceptibility-weighted  signal loss within the right basal ganglia and within/along the right insula, which may reflect chronic microhemorrhages or acute petechial hemorrhage (Heidelberg class 1a, type HI1 scattered small petechiae, no mass effect). There are a few nonspecific punctate chronic microhemorrhages scattered elsewhere within the supratentorial brain. Background multifocal T2 FLAIR hyperintense signal abnormality within the cerebral white matter, nonspecific but compatible with moderate chronic small vessel ischemic disease. Chronic lacunar infarct within the left thalamus. No evidence of an intracranial mass. No extra-axial fluid collection. No midline shift. Vascular: Maintained flow voids within the proximal large arterial vessels. Skull and upper cervical spine: No focal worrisome marrow lesion. Sinuses/Orbits: No mass or acute finding within the imaged orbits. Mild mucosal thickening within the bilateral sphenoid sinuses. Other: Trace fluid within the left mastoid air cells. Impression #1 will be called to the ordering clinician or representative by the Radiologist Assistant, and communication documented in the PACS or [employer]. IMPRESSION: 1. Fairly extensive acute right middle cerebral artery territory infarcts. There are a few superimposed small foci of susceptibility-weighted  signal loss within the right basal ganglia and within/along the right insula, which may reflect chronic microhemorrhages or acute petechial hemorrhage (Heidelberg class 1a, type HI1 scattered small petechiae, no mass effect). Consider a non-contrast head CT for further evaluation. 2. Few nonspecific punctate chronic microhemorrhages scattered elsewhere within the supratentorial brain. 3. Background moderate cerebral white matter chronic small vessel ischemic disease. 4. Chronic lacunar infarct within the left thalamus. Electronically Signed   By: Rockey Childs D.O.   On: 12/01/2023 14:39   ECHOCARDIOGRAM COMPLETE Result Date: 12/01/2023     ECHOCARDIOGRAM REPORT   Patient Name:   Ian Wong Date of Exam: 12/01/2023 Medical Rec #:  [id number]         Height:       71.0 in Accession #:    [id number]        Weight:       213.3 lb Date of Birth:  [birth year]         BSA:          2.167 m Patient Age:    62 years          BP:           124/51 mmHg Patient Gender: M                 HR:           54 bpm. Exam Location:  Inpatient Procedure: 2D Echo, Cardiac Doppler and Color Doppler Indications:    Stroke  History:        Patient has prior history of Echocardiogram examinations, most                 recent 01/04/2017. PAD and Stroke.  Sonographer:    Lanell Maduro Referring Phys: SALMAN KHALIQDINA IMPRESSIONS  1. Technically difficult study with very limited visualization of cardiac structures.  2. Left ventricular ejection fraction, by estimation, is 50 to 55%. The left ventricle has normal function. The left ventricle has no regional wall motion abnormalities. Left ventricular diastolic parameters were normal.  3. Right ventricular systolic function is normal. The right ventricular size is normal.  4. The mitral valve is grossly normal. No evidence of mitral valve regurgitation.  5. The aortic valve was not well visualized. Aortic valve regurgitation is not visualized.  6. The inferior vena cava is dilated in size with <50% respiratory variability, suggesting right atrial pressure of 15 mmHg. FINDINGS  Left Ventricle: Left ventricular ejection fraction, by estimation, is 50 to 55%. The left ventricle has normal function. The left ventricle has no regional wall motion abnormalities. The left ventricular internal cavity size was normal in size. There is  no left ventricular hypertrophy. Left ventricular diastolic parameters were normal. Right Ventricle: The right ventricular size is normal. No increase in right ventricular wall thickness. Right ventricular systolic function is normal. Left Atrium: Left atrial size was normal in size. Right Atrium:  Right atrial size was normal in size. Pericardium: There is no evidence of pericardial effusion. Mitral Valve: The mitral valve is grossly normal. No evidence of mitral valve regurgitation. Tricuspid Valve: The tricuspid valve is not well visualized. Tricuspid valve regurgitation is trivial. Aortic Valve: The aortic valve was not well visualized. Aortic valve regurgitation is not visualized. Pulmonic Valve: The pulmonic valve was not assessed. Pulmonic valve regurgitation is not visualized. Aorta: The aortic root was not well visualized. Venous: The inferior vena cava is dilated in size with less than 50% respiratory variability, suggesting right atrial  pressure of 15 mmHg. IAS/Shunts: The interatrial septum was not well visualized.  LEFT VENTRICLE PLAX 2D LVIDd:         4.40 cm      Diastology LVIDs:         3.70 cm      LV e' medial:    10.30 cm/s LV PW:         0.80 cm      LV E/e' medial:  8.0 LV IVS:        0.70 cm      LV e' lateral:   12.70 cm/s LVOT diam:     2.20 cm      LV E/e' lateral: 6.5 LV SV:         63 LV SV Index:   29 LVOT Area:     3.80 cm  LV Volumes (MOD) LV vol d, MOD A2C: 88.3 ml LV vol d, MOD A4C: 152.0 ml LV vol s, MOD A2C: 44.0 ml LV vol s, MOD A4C: 72.5 ml LV SV MOD A2C:     44.3 ml LV SV MOD A4C:     152.0 ml LV SV MOD BP:      61.7 ml RIGHT VENTRICLE             IVC RV Basal diam:  4.30 cm     IVC diam: 2.30 cm RV Mid diam:    2.90 cm RV S prime:     10.40 cm/s TAPSE (M-mode): 2.1 cm LEFT ATRIUM           Index        RIGHT ATRIUM          Index LA diam:      2.70 cm 1.25 cm/m   RA Area:     9.40 cm LA Vol (A2C): 13.0 ml 6.00 ml/m   RA Volume:   16.90 ml 7.80 ml/m LA Vol (A4C): 33.2 ml 15.32 ml/m  AORTIC VALVE LVOT Vmax:   75.70 cm/s LVOT Vmean:  48.700 cm/s LVOT VTI:    0.166 m MITRAL VALVE MV Area (PHT): 5.38 cm    SHUNTS MV Decel Time: 141 msec    Systemic VTI:  0.17 m MV E velocity: 82.10 cm/s  Systemic Diam: 2.20 cm MV A velocity: 83.70 cm/s MV E/A ratio:  0.98 Aditya  Sabharwal Electronically signed by Ria Commander Signature Date/Time: 12/01/2023/10:36:35 AM    Final    Portable Chest xray Result Date: 12/01/2023 CLINICAL DATA:  Respiratory failure. EXAM: PORTABLE CHEST 1 VIEW COMPARISON:  January 08, 2017. FINDINGS: The heart size and mediastinal contours are within normal limits. Endotracheal and nasogastric tubes are unchanged. Minimal bibasilar subsegmental atelectasis is again noted. The visualized skeletal structures are unremarkable. IMPRESSION: Stable support apparatus. Minimal bibasilar subsegmental atelectasis. Electronically Signed   By: Lynwood Landy Raddle M.D.   On: 12/01/2023 10:05   CT HEAD WO CONTRAST ( ) Result Date: 11/30/2023 CLINICAL DATA:  Stroke follow-up, status post intervention EXAM: CT HEAD WITHOUT CONTRAST TECHNIQUE: Contiguous axial images were obtained from the base of the skull through the vertex without intravenous contrast. RADIATION DOSE REDUCTION: This exam was performed according to the departmental dose-optimization program which includes automated exposure control, adjustment of the mA and/or kV according to patient size and/or use of iterative reconstruction technique. COMPARISON:  None Available. FINDINGS: Exam quality is markedly degraded by streak artifacts from outside the field of view. Brain: There is no mass, hemorrhage or extra-axial collection. The size and configuration  of the ventricles and extra-axial CSF spaces are normal. There is hypoattenuation of the white matter, most commonly indicating chronic small vessel disease. Vascular: No hyperdense vessel or unexpected vascular calcification. Skull: The visualized skull base, calvarium and extracranial soft tissues are normal. Sinuses/Orbits: No fluid levels or advanced mucosal thickening of the visualized paranasal sinuses. No mastoid or middle ear effusion. Normal orbits. Other: None. IMPRESSION: 1. No acute intracranial abnormality. 2. Chronic small vessel disease. 3.  Exam quality is markedly degraded by streak artifacts from outside the field of view. Electronically Signed   By: Franky Stanford M.D.   On: 11/30/2023 21:12   CT C-SPINE NO CHARGE Result Date: 11/30/2023 CLINICAL DATA:  Polytrauma, blunt. EXAM: CT CERVICAL SPINE WITHOUT CONTRAST TECHNIQUE: Multidetector CT imaging of the cervical spine was performed without intravenous contrast. Multiplanar CT image reconstructions were also generated. RADIATION DOSE REDUCTION: This exam was performed according to the departmental dose-optimization program which includes automated exposure control, adjustment of the mA and/or kV according to patient size and/or use of iterative reconstruction technique. COMPARISON:  CTA head/neck 11/30/2023. FINDINGS: Alignment: Normal. Skull base and vertebrae: No acute fracture. Normal craniocervical junction. No suspicious bone lesions. Soft tissues and spinal canal: No prevertebral fluid or swelling. No visible canal hematoma. Please refer to same-day CTA for vascular findings. Disc levels:  No significant degenerative change. Upper chest: No acute findings. Other: None. IMPRESSION: No acute fracture or traumatic listhesis of the cervical spine. Electronically Signed   By: Ryan Chess M.D.   On: 11/30/2023 15:28   CT HEAD CODE STROKE WO CONTRAST Result Date: 11/30/2023 CLINICAL DATA:  Code stroke. Neuro deficit, acute, stroke suspected R ICA and MCA occlusion, s/p TNK, now with h/a. Headache after thrombolytic therapy. EXAM: CT HEAD WITHOUT CONTRAST TECHNIQUE: Contiguous axial images were obtained from the base of the skull through the vertex without intravenous contrast. RADIATION DOSE REDUCTION: This exam was performed according to the departmental dose-optimization program which includes automated exposure control, adjustment of the mA and/or kV according to patient size and/or use of iterative reconstruction technique. COMPARISON:  Head CT and CTA head/neck 11/30/2023. FINDINGS:  Brain: No acute hemorrhage. Unchanged loss of gray-white differentiation along the right insula and anterior temporal lobe. Stable background of moderate chronic small-vessel disease. No hydrocephalus or extra-axial collection. No mass effect or midline shift. Vascular: Circulating contrast from recent CTA. Skull: No calvarial fracture or suspicious bone lesion. Skull base is unremarkable. Sinuses/Orbits: No acute finding. Other: None. ASPECTS [facility] Stroke Program Early CT Score) - Ganglionic level infarction (caudate, lentiform nuclei, internal capsule, insula, M1-M3 cortex): 5 - Supraganglionic infarction (M4-M6 cortex): 3 Total score (0-10 with 10 being normal): 8 IMPRESSION: 1. Unchanged loss of gray-white differentiation along the right insula and anterior temporal lobe, compatible with acute infarct. No acute hemorrhage. 2. ASPECT score is 8. Code stroke imaging results were communicated on 11/30/2023 at 3:24 pm to provider Dr. Matthews via secure text paging. Electronically Signed   By: Ryan Chess M.D.   On: 11/30/2023 15:24   CT HEAD CODE STROKE WO CONTRAST Addendum Date: 11/30/2023 ADDENDUM REPORT: 11/30/2023 14:57 ADDENDUM: Findings discussed with Dr. Matthews via telephone at 2:52 p.m. Electronically Signed   By: Gilmore GORMAN Molt M.D.   On: 11/30/2023 14:57   Result Date: 11/30/2023 CLINICAL DATA:  Code stroke.  Neuro deficit, acute, stroke suspected EXAM: CT HEAD WITHOUT CONTRAST TECHNIQUE: Contiguous axial images were obtained from the base of the skull through the vertex without intravenous contrast. RADIATION DOSE  REDUCTION: This exam was performed according to the departmental dose-optimization program which includes automated exposure control, adjustment of the mA and/or kV according to patient size and/or use of iterative reconstruction technique. COMPARISON:  None Available. FINDINGS: Brain: Loss of gray-white differentiation in the right insula and overlying right operculum. No evidence  of acute hemorrhage, mass effect or midline shift. Additional patchy white matter hypodensities are nonspecific but compatible with chronic microvascular ischemic disease. Vascular: No hyperdense vessel identified. Skull: No acute fracture identified. Sinuses/Orbits: Clear sinuses.  No acute orbital findings. Other: No mastoid effusions. ASPECTS [facility] Stroke Program Early CT Score) Total score (0-10 with 10 being normal): 8. DR. MATTHEWS paged at the time of dictation.  Awaiting call back. IMPRESSION: 1. Hyperdense right M1 MCA, concerning for thrombus. See forthcoming CTA. 2. Findings compatible with acute right MCA territory infarct with loss of gray differentiation in the right insula and the overlying operculum. ASPECTS 8. Electronically Signed: By: Gilmore GORMAN Molt M.D. On: 11/30/2023 14:47   CT ANGIO HEAD NECK W WO CM (CODE STROKE) Result Date: 11/30/2023 CLINICAL DATA:  Neuro deficit, acute, stroke suspected R gaze L hemiplegia EXAM: CT ANGIOGRAPHY HEAD AND NECK WITH AND WITHOUT CONTRAST TECHNIQUE: Multidetector CT imaging of the head and neck was performed using the standard protocol during bolus administration of intravenous contrast. Multiplanar CT image reconstructions and MIPs were obtained to evaluate the vascular anatomy. Carotid stenosis measurements (when applicable) are obtained utilizing NASCET criteria, using the distal internal carotid diameter as the denominator. RADIATION DOSE REDUCTION: This exam was performed according to the departmental dose-optimization program which includes automated exposure control, adjustment of the mA and/or kV according to patient size and/or use of iterative reconstruction technique. CONTRAST:  75mL OMNIPAQUE  IOHEXOL  350 MG/ML SOLN COMPARISON:  None Available. FINDINGS: CTA NECK FINDINGS Aortic arch: Great vessel origins are patent. Aortic atherosclerosis. Right carotid system: Occluded ICA at its origin with non opacification throughout the neck. Left carotid  system: Atherosclerosis at the carotid bifurcation with 70% stenosis of the ICA origin. Vertebral arteries: Right dominant. Severe right vertebral artery origin stenosis. Patent bilaterally. Skeleton: No acute abnormality on limited assessment. Other neck: No acute abnormality on limited assessment. Upper chest: Visualized lung apices are clear. Review of the MIP images confirms the above findings CTA HEAD FINDINGS Anterior circulation: The ICA is non-opacified proximally with brief reconstitution of the paraclinoid ICA. The ICAs and occluded again at the terminus with non-opacified M1 MCA and little opacification of more distal MCA vessels. Left ICA and left MCA are opacified. The left A1 ACA is hypoplastic. Right A1 ACA and more distal ACAs bilaterally are opacified (right A1 ACAs likely from retrograde flow). Posterior circulation: Small/non dominant left intradural vertebral artery with superimposed severe stenosis. Right vertebral artery is patent. Basilar artery and bilateral posterior cerebral arteries are patent without proximal high-grade stenosis. Venous sinuses: As permitted by contrast timing, patent. Review of the MIP images confirms the above findings IMPRESSION: 1. Occluded right ICA at its origin with non opacification throughout the neck. The right paraclinoid ICA briefly reconstitutes with subsequent occlusion of the right M1 MCA, concerning for thrombus given hyperdense appearance on same day CT head. 2. Severe right vertebral artery origin stenosis. 3. Severe stenosis of the intradural small/non dominant left vertebral artery. 4. Approximately 70% stenosis of the left ICA origin. Findings discussed with Dr. Matthews via telephone at 2:52 p.m. Electronically Signed   By: Gilmore GORMAN Molt M.D.   On: 11/30/2023 14:57    Labs:  CBC: Recent Labs    11/30/23 1424 11/30/23 1430 12/01/23 0646 12/02/23 0645  WBC 8.0  --  12.7* 13.0*  HGB 16.4 15.0 14.6 13.6  HCT 46.8 44.0 42.7 38.8*  PLT 222   --  199 173    COAGS: Recent Labs    11/30/23 1424  INR 1.1  APTT 22*    BMP: Recent Labs    11/30/23 1424 11/30/23 1430 12/01/23 0646 12/02/23 0645  NA 135 139 139 137  K 3.7 3.8 4.2 3.7  CL 105 103 108 109  CO2 24  --  22 21*  GLUCOSE 108* 106* 99 106*  BUN 14 14 10 10   CALCIUM  9.0  --  8.1* 8.0*  CREATININE 1.08 1.20 1.04 0.93  GFRNONAA >60  --  >60 >60    LIVER FUNCTION TESTS: Recent Labs    11/30/23 1424  BILITOT 0.8  AST 24  ALT 23  ALKPHOS 59  PROT 7.4  ALBUMIN  3.6    TUMOR MARKERS: No results for input(s): AFPTM, CEA, CA199, CHROMGRNA in the last 8760 hours.  Assessment and Plan:  63 year old male status post mechanical thrombectomy with cervical right carotid angioplasty and stenting.  Results of recent MRI of the brain discussed with patient's sister.  Continue aspirin  and Brilinta  and blood pressure management.  A carotid duplex will be obtained in 3 months.  Further management per stroke team.     Electronically Signed: Delesa Kawa De Macedo Rodrigues, MD 12/02/2023, 3:14 PM   I spent a total of 15 Minutes   in face to face in clinical consultation, greater than 50% of which was counseling/coordinating care for right ICA stroke status post mechanical thrombectomy and carotid stenting.

## 2023-12-02 NOTE — Progress Notes (Signed)
 OT NOTE PT admitted with R MCA. Pt currently with functional limitiations due to the deficits listed below (see OT problem list). Pt working as curator on tractors indep with adls. Pt at this time requires (A) for all aspects of care and poor oral secretions noted. Pt demonstrates accessory breathing with stable vital signs noted. RN made aware of RR 21 but wheezy breath sounds. Pt upright in chair with recommendation fory maxisky back to bed (pad in place). Pt will benefit from skilled OT to increase their independence and safety with adls and balance to allow discharge Patient will benefit from intensive inpatient follow up therapy, >3 hours/day .   12/02/23 1100  OT Visit Information  Last OT Received On 12/01/22  Assistance Needed +2  PT/OT/SLP Co-Evaluation/Treatment Yes  Reason for Co-Treatment Necessary to address cognition/behavior during functional activity;For patient/therapist safety;To address functional/ADL transfers  OT goals addressed during session ADL's and self-care;Proper use of Adaptive equipment and DME;Strengthening/ROM  History of Present Illness 63 yo male transfer from [facility] with L side weakness and R gaze deviation CT (+) R MCA 12/31 thrombectomy  extubated 12/31 [facility] GERD hypothyroidism  Precautions  Precautions Fall  Restrictions  Weight Bearing Restrictions Per Provider Order No  Home Living  Family/patient expects to be discharged to: Private residence  Living Arrangements Spouse/significant other  Available Help at Discharge Friend(s);Available 24 hours/day  Type of Home Mobile home  Home Access Ramped entrance  Home Layout One level  Bathroom Shower/Tub Tub/shower unit  Tour Manager None  Additional Comments wife is home with son with physical disability during the day  Prior Function  Prior Level of Function  Independent/Modified Independent;Working/employed;Driving  ADLs Comments works on it trainer  Pain Assessment  Pain  Assessment No/denies pain  Pain Location generalized  Pain Intervention(s) Repositioned;Monitored during session  Cognition  Arousal Obtunded  Behavior During Therapy Flat affect  Overall Cognitive Status Impaired/Different from baseline  Area of Impairment Following commands  Following Commands Follows one step commands with increased time;Follows one step commands inconsistently  General Comments pt following simple commands with increased time. Pt delayed responses to questions  Communication  Communication Difficulty communicating thoughts/reduced clarity of speech  Upper Extremity Assessment  Upper Extremity Assessment Right hand dominant;LUE deficits/detail  LUE Deficits / Details flaccid not activation noted this session. pt without subluxation. pt could benefit from resting hand splint  LUE Sensation decreased proprioception;decreased light touch  LUE Coordination decreased fine motor;decreased gross motor  Lower Extremity Assessment  Lower Extremity Assessment Defer to PT evaluation  Cervical / Trunk Assessment  Cervical / Trunk Assessment Normal  Vision- History  Baseline Vision/History 1 Wears glasses  Ability to See in Adequate Light 1 Impaired  Patient Visual Report Blurring of vision  Vision- Assessment  Vision Assessment? Wears glasses for reading;Wears glasses for driving  Additional Comments R gaze preference. pt with eyes partially open at times during session. pt needs cues to attend. pt able to track past midline  Perception  Perception Impaired  Preception Impairment Details Inattention/Neglect  Perception-Other Comments L inattention  ADL  Overall ADL's  Needs assistance/impaired  Grooming Minimal assistance;Sitting  Grooming Details (indicate cue type and reason) cues for wiping lacks awareness  Upper Body Bathing Moderate assistance  Lower Body Bathing Maximal assistance  Upper Body Dressing  Moderate assistance  Lower Body Dressing Maximal assistance   Toilet Transfer +2 for physical assistance;Maximal assistance;Stand-pivot;BSC/3in1  Toilet Transfer Details (indicate cue type and reason) simulated bed to chair  Bed Mobility  Overal bed mobility Needs Assistance  Bed Mobility Rolling;Supine to Sit  Rolling Mod assist  Supine to sit Mod assist  General bed mobility comments requires (A) to transfer to the L side, pt needs help to elevate trunk from surface  Transfers  Overall transfer level Needs assistance  Transfers Sit to/from Stand;Bed to chair/wheelchair/BSC  Sit to Stand +2 physical assistance;Max assist;From elevated surface  Bed to/from chair/wheelchair/BSC transfer type: Stand pivot  Stand pivot transfers +2 physical assistance;Max assist;From elevated surface  General transfer comment pt requires (A) to swing helps and advance LLE.  Balance  Overall balance assessment Needs assistance  Sitting-balance support Single extremity supported;Feet supported  Sitting balance-Leahy Scale Poor  Sitting balance - Comments pushing with R UE toward L side without awareness. R gaze preference with cervical rotation  General Comments  General comments (skin integrity, edema, etc.) RN informed blood noted on pad but none found on patient. the IR site is dry and dressed at this time. pt very wheezy breath sounds  OT - End of Session  Equipment Utilized During Treatment Gait belt  Activity Tolerance Patient tolerated treatment well  Patient left in chair;with call bell/phone within reach;with chair alarm set;with nursing/sitter in room  Nurse Communication Mobility status;Precautions;Need for lift equipment  OT Assessment  OT Recommendation/Assessment Patient needs continued OT Services  OT Visit Diagnosis Unsteadiness on feet (R26.81);Muscle weakness (generalized) (M62.81);Hemiplegia and hemiparesis  Hemiplegia - Right/Left Left  Hemiplegia - dominant/non-dominant Non-Dominant  Hemiplegia - caused by Cerebral infarction  OT Problem List  Decreased strength;Decreased range of motion;Decreased activity tolerance;Impaired balance (sitting and/or standing);Impaired vision/perception;Decreased coordination;Decreased cognition;Decreased safety awareness;Decreased knowledge of use of DME or AE;Decreased knowledge of precautions;Cardiopulmonary status limiting activity;Impaired sensation;Obesity;Impaired UE functional use  OT Plan  OT Frequency (ACUTE ONLY) Min 1X/week  OT Treatment/Interventions (ACUTE ONLY) Self-care/ADL training;Therapeutic exercise;Neuromuscular education;Energy conservation;DME and/or AE instruction;Manual therapy;Modalities;Splinting;Therapeutic activities;Cognitive remediation/compensation;Patient/family education;Balance training;Visual/perceptual remediation/compensation  AM-PAC OT 6 Clicks Daily Activity Outcome Measure (Version 2)  Help from another person eating meals? 2  Help from another person taking care of personal grooming? 2  Help from another person toileting, which includes using toliet, bedpan, or urinal? 1  Help from another person bathing (including washing, rinsing, drying)? 1  Help from another person to put on and taking off regular upper body clothing? 1  Help from another person to put on and taking off regular lower body clothing? 1  6 Click Score 8  Progressive Mobility  What is the highest level of mobility based on the progressive mobility assessment? Level 2 (Chairfast) - Balance while sitting on edge of bed and cannot stand  Mobility Referral No  Activity Transferred from bed to chair  OT Recommendation  Recommendations for Other Services Rehab consult  Follow Up Recommendations Acute inpatient rehab (3hours/day)  Patient can return home with the following Two people to help with walking and/or transfers;Two people to help with bathing/dressing/bathroom  Functional Status Assessent Patient has had a recent decline in their functional status and demonstrates the ability to make  significant improvements in function in a reasonable and predictable amount of time.  OT Equipment BSC/3in1;Wheelchair (measurements OT);Wheelchair cushion (measurements OT);Hospital bed;Hoyer lift  Individuals Consulted  Consulted and Agree with Results and Recommendations Patient unable/family or caregiver not available  Acute Rehab OT Goals  Patient Stated Goal none stated  OT Goal Formulation Patient unable to participate in goal setting  Time For Goal Achievement 12/16/23  Potential to Achieve Goals Good  OT Time Calculation  OT Start Time (ACUTE ONLY) 1121  OT Stop Time (ACUTE ONLY) 1146  OT Time Calculation (min) 25 min  OT General Charges  $OT Visit 1 Visit  OT Evaluation  $OT Eval Moderate Complexity 1 Mod   Brynn, OTR/L  Acute Rehabilitation Services Office: [phone] .

## 2023-12-02 NOTE — Progress Notes (Addendum)
 STROKE TEAM PROGRESS NOTE   BRIEF HPI [name] is a 63 y.o. male with past medical history significant for seizures, hypothyroidism, GERD who presented to [name] ED 12/30 with persistent right gaze deviation and left hemiplegia.  His last known well was 1245 family did call EMS when his symptoms started.  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  Patient was given TNK at [facility] and was transferred to [facility] for emergent thrombectomy. On neurologic neurology exam on arrival to [facility] patient still had persistent left-sided weakness, right gaze, left-sided sensory deficit, left hemianopsia, left sensory neglect. He was then taken emergently to IR.  NIH on Admission: 2  SIGNIFICANT HOSPITAL EVENTS 12/30: Transferred from [name] to [facility] for emergent thrombectomy  Mechanical thrombectomy, right carotid stent and angioplasty  --> TICI 3  Left intubated postprocedure  Post procedure CT showed no hemorrhage. 12/31: MRI shows extensive acute right MCA territory infarcts with chronic microhemorrhages versus acute petechial hemorrhages.  1/1: Remains in ICU. Ongoing Neo for BP support. SLP NPO except for meds.  INTERIM HISTORY/SUBJECTIVE No family at bedside.  Patient extubated 12/01/2023.  Neurologic exam stable with minimal LUE movement, left facial weakness, slight right gaze, mild dysarthria.  Patient started on aspirin  and Brilinta  12/01/2023.  SLP swallow evaluation n.p.o. except for meds.  He is yet requiring vasopressors to keep systolic blood pressure greater than 110.  OBJECTIVE CBC    Component Value Date/Time   WBC 13.0 (H) 12/02/2023 0645   RBC 4.43 12/02/2023 0645   HGB 13.6 12/02/2023 0645   HCT 38.8 (L) 12/02/2023 0645   PLT 173 12/02/2023 0645   MCV 87.6 12/02/2023 0645   MCH 30.7 12/02/2023 0645   MCHC 35.1 12/02/2023 0645   RDW 13.1 12/02/2023 0645   LYMPHSABS 2.2 11/30/2023 1424    MONOABS 1.1 (H) 11/30/2023 1424   EOSABS 0.2 11/30/2023 1424   BASOSABS 0.1 11/30/2023 1424   BMET    Component Value Date/Time   NA 137 12/02/2023 0645   K 3.7 12/02/2023 0645   CL 109 12/02/2023 0645   CO2 21 (L) 12/02/2023 0645   GLUCOSE 106 (H) 12/02/2023 0645   BUN 10 12/02/2023 0645   CREATININE 0.93 12/02/2023 0645   CALCIUM  8.0 (L) 12/02/2023 0645   GFRNONAA >60 12/02/2023 0645   Lab Results  Component Value Date   HGBA1C 5.9 (H) 11/30/2023   Lab Results  Component Value Date   CHOL 112 12/01/2023   HDL 26 (L) 12/01/2023   LDLCALC 57 12/01/2023   TRIG 146 12/01/2023   CHOLHDL 4.3 12/01/2023   IMAGING past 24 hours MR BRAIN WO CONTRAST Result Date: 12/01/2023 CLINICAL DATA:  Provided history: Stroke, follow-up. 24 hours post TNKase . EXAM: MRI HEAD WITHOUT CONTRAST TECHNIQUE: Multiplanar, multiecho pulse sequences of the brain and surrounding structures were obtained without intravenous contrast. COMPARISON:  Prior non-contrast head CT examinations 11/30/2023 and earlier. CT angiogram head/neck 11/30/2023. FINDINGS: Brain: No age advanced or lobar predominant parenchymal atrophy. Fairly extensive acute cortical/subcortical right MCA territory infarcts affecting portions of the frontal lobe/insula, temporal lobe, parietal lobe, occipital lobe and basal ganglia. There are few superimposed small foci of susceptibility-weighted signal loss within the right basal ganglia and within/along the right insula, which may reflect chronic microhemorrhages or acute petechial hemorrhage (Heidelberg class 1a, type HI1 scattered small petechiae, no mass effect). There are a few nonspecific punctate chronic microhemorrhages scattered elsewhere  within the supratentorial brain. Background multifocal T2 FLAIR hyperintense signal abnormality within the cerebral white matter, nonspecific but compatible with moderate chronic small vessel ischemic disease. Chronic lacunar infarct within the left  thalamus. No evidence of an intracranial mass. No extra-axial fluid collection. No midline shift. Vascular: Maintained flow voids within the proximal large arterial vessels. Skull and upper cervical spine: No focal worrisome marrow lesion. Sinuses/Orbits: No mass or acute finding within the imaged orbits. Mild mucosal thickening within the bilateral sphenoid sinuses. Other: Trace fluid within the left mastoid air cells. Impression #1 will be called to the ordering clinician or representative by the Radiologist Assistant, and communication documented in the PACS or [employer]. IMPRESSION: 1. Fairly extensive acute right middle cerebral artery territory infarcts. There are a few superimposed small foci of susceptibility-weighted signal loss within the right basal ganglia and within/along the right insula, which may reflect chronic microhemorrhages or acute petechial hemorrhage (Heidelberg class 1a, type HI1 scattered small petechiae, no mass effect). Consider a non-contrast head CT for further evaluation. 2. Few nonspecific punctate chronic microhemorrhages scattered elsewhere within the supratentorial brain. 3. Background moderate cerebral white matter chronic small vessel ischemic disease. 4. Chronic lacunar infarct within the left thalamus. Electronically Signed   By: Rockey Childs D.O.   On: 12/01/2023 14:39   ECHOCARDIOGRAM COMPLETE Result Date: 12/01/2023    ECHOCARDIOGRAM REPORT   Patient Name:   Ian Wong Date of Exam: 12/01/2023 Medical Rec #:  [id number]         Height:       71.0 in Accession #:    [id number]        Weight:       213.3 lb Date of Birth:  04-Feb-1961         BSA:          2.167 m Patient Age:    62 years          BP:           124/51 mmHg Patient Gender: M                 HR:           54 bpm. Exam Location:  Inpatient Procedure: 2D Echo, Cardiac Doppler and Color Doppler Indications:    Stroke  History:        Patient has prior history of Echocardiogram examinations, most                  recent 01/04/2017. PAD and Stroke.  Sonographer:    Lanell Maduro Referring Phys: SALMAN KHALIQDINA IMPRESSIONS  1. Technically difficult study with very limited visualization of cardiac structures.  2. Left ventricular ejection fraction, by estimation, is 50 to 55%. The left ventricle has normal function. The left ventricle has no regional wall motion abnormalities. Left ventricular diastolic parameters were normal.  3. Right ventricular systolic function is normal. The right ventricular size is normal.  4. The mitral valve is grossly normal. No evidence of mitral valve regurgitation.  5. The aortic valve was not well visualized. Aortic valve regurgitation is not visualized.  6. The inferior vena cava is dilated in size with <50% respiratory variability, suggesting right atrial pressure of 15 mmHg. FINDINGS  Left Ventricle: Left ventricular ejection fraction, by estimation, is 50 to 55%. The left ventricle has normal function. The left ventricle has no regional wall motion abnormalities. The left ventricular internal cavity size was normal in size. There is  no left ventricular  hypertrophy. Left ventricular diastolic parameters were normal. Right Ventricle: The right ventricular size is normal. No increase in right ventricular wall thickness. Right ventricular systolic function is normal. Left Atrium: Left atrial size was normal in size. Right Atrium: Right atrial size was normal in size. Pericardium: There is no evidence of pericardial effusion. Mitral Valve: The mitral valve is grossly normal. No evidence of mitral valve regurgitation. Tricuspid Valve: The tricuspid valve is not well visualized. Tricuspid valve regurgitation is trivial. Aortic Valve: The aortic valve was not well visualized. Aortic valve regurgitation is not visualized. Pulmonic Valve: The pulmonic valve was not assessed. Pulmonic valve regurgitation is not visualized. Aorta: The aortic root was not well visualized. Venous: The  inferior vena cava is dilated in size with less than 50% respiratory variability, suggesting right atrial pressure of 15 mmHg. IAS/Shunts: The interatrial septum was not well visualized.  LEFT VENTRICLE PLAX 2D LVIDd:         4.40 cm      Diastology LVIDs:         3.70 cm      LV e' medial:    10.30 cm/s LV PW:         0.80 cm      LV E/e' medial:  8.0 LV IVS:        0.70 cm      LV e' lateral:   12.70 cm/s LVOT diam:     2.20 cm      LV E/e' lateral: 6.5 LV SV:         63 LV SV Index:   29 LVOT Area:     3.80 cm  LV Volumes (MOD) LV vol d, MOD A2C: 88.3 ml LV vol d, MOD A4C: 152.0 ml LV vol s, MOD A2C: 44.0 ml LV vol s, MOD A4C: 72.5 ml LV SV MOD A2C:     44.3 ml LV SV MOD A4C:     152.0 ml LV SV MOD BP:      61.7 ml RIGHT VENTRICLE             IVC RV Basal diam:  4.30 cm     IVC diam: 2.30 cm RV Mid diam:    2.90 cm RV S prime:     10.40 cm/s TAPSE (M-mode): 2.1 cm LEFT ATRIUM           Index        RIGHT ATRIUM          Index LA diam:      2.70 cm 1.25 cm/m   RA Area:     9.40 cm LA Vol (A2C): 13.0 ml 6.00 ml/m   RA Volume:   16.90 ml 7.80 ml/m LA Vol (A4C): 33.2 ml 15.32 ml/m  AORTIC VALVE LVOT Vmax:   75.70 cm/s LVOT Vmean:  48.700 cm/s LVOT VTI:    0.166 m MITRAL VALVE MV Area (PHT): 5.38 cm    SHUNTS MV Decel Time: 141 msec    Systemic VTI:  0.17 m MV E velocity: 82.10 cm/s  Systemic Diam: 2.20 cm MV A velocity: 83.70 cm/s MV E/A ratio:  0.98 Aditya Sabharwal Electronically signed by Ria Commander Signature Date/Time: 12/01/2023/10:36:35 AM    Final    Vitals:   12/02/23 0730 12/02/23 0745 12/02/23 0800 12/02/23 0815  BP: (!) 137/53 (!) 105/30 (!) 131/53 (!) 117/48  Pulse: (!) 50 60 62 (!) 56  Resp: 20 20 20  (!) 23  Temp:      TempSrc:  SpO2: 97% 96% 97% 93%  Weight:      Height:       PHYSICAL EXAM General: Appears ill laying in ICU bed, in no acute distress CV: Regular rate and rhythm on monitor Respiratory: Extubated 12/31, regular respiratory rate without respiratory  distress.  Supplemental oxygen via [place].   NEURO:  Drowsy, wakes to voice.  Patient follows commands without difficulty.  Speech with mild dysarthria.  Nonfluent speech with 1-2 word answers consistently.  No aphasia noted.  Patient is oriented to self, place, age, situation.  PERRL, VFF, slight right gaze preference noted, mild left facial weakness, some decreased sensation to the left upper extremity. Left upper extremity moves minimally without gravity, right upper extremity and right lower extremity strength intact with minimal LLE weakness noted. Gait deferred.   Most Recent NIH: 10  ASSESSMENT/PLAN Acute Ischemic Infarct:  right ICA and MCA distribution s/p mechanical thrombectomy and TNK with TICI 3 revascularization with use of rescue right carotid stent Etiology:  large vessel disease, with Right ICA and MCA occlusion  Code Stroke CT head  negative for a large hypodensity concerning for a large territory infarct or hyperdensity concerning for an ICH  CTA head & neck  R ICA occlusion with distal intracranial reconstitution before occlusion of the proximal right MCA M1 with poor opacification of the distal vessels.  Post IR CT: No hemorrhage MRI:  Fairly extensive acute right MCA territory infarcts Chronic microhemorrhages versus acute petechial hemorrhages seen along the right basal ganglia Chronic lacunar infarct within the left thalamus Carotid Doppler: PENDING 2D Echo: LVEF 50 to 55% LDL 57 HgbA1c 5.9% VTE prophylaxis - SCDs aspirin  81 mg daily prior to admission, now on aspirin  81 mg daily and Brilinta  (ticagrelor ) 90 mg bid  Therapy recommendations:  CIR for speech, pending PT/OT evaluations and recommendations  Disposition:  Pending  Essential Hypertension Home meds:  none Unstable Soft blood pressures following TNK requiring Neo for BP support for SBP > 120 Blood pressure goal: 120-180 Avoid hypotension  Hyperlipidemia Home meds: Lipitor 20 mg, resumed in  hospital LDL 57, goal < 70 Continue statin at discharge  Tobacco Abuse Former Cigarette Smoker  Dysphagia Patient has post-stroke dysphagia, SLP consulted SLP evaluation 1/1 NPO except for meds SLP ongoing follow up for diet versus MBS Recommended CIR for ongoing ST needs Advance diet as tolerated  Other Stroke Risk Factors Obesity, Body mass index is 29.75 kg/m., BMI >/= 30 associated with increased stroke risk, recommend weight loss, diet and exercise as appropriate   Other Active Problems Hypothyroidism Hx of Seizures, no AED at home  Hospital day # 2  Pt seen by Neuro NP/APP and later by MD. Note/plan to be edited by MD as needed.    Stevi Toberman, AGACNP-BC [facility] Pager: [phone]  I have personally obtained history,examined this patient, reviewed notes, independently viewed imaging studies, participated in medical decision making and plan of care.ROS completed by me personally and pertinent positives fully documented  I have made any additions or clarifications directly to the above note. Agree with note above.  Patient has been extubated but continues to have right gaze preference with left hemiparesis and is requiring vasopressor support yor hemodynamic support.  Plan wean off vasopressor support and mobilize out of bed and therapy consults.  Continue aspirin  and Brilinta  for carotid stent and aggressive risk factor modification.  No family available at the bedside for discussion.This patient is critically ill and at significant risk of neurological worsening, death  and care requires constant monitoring of vital signs, hemodynamics,respiratory and cardiac monitoring, extensive review of multiple databases, frequent neurological assessment, discussion with family, other specialists and medical decision making of high complexity.I have made any additions or clarifications directly to the above note.This critical care time does not reflect procedure time, or  teaching time or supervisory time of PA/NP/Med Resident etc but could involve care discussion time.  I spent 30 minutes of neurocritical care time  in the care of  this patient.      Eather Popp, MD Medical Director [facility] Pager: [phone] 12/02/2023 2:35 PM  To contact Stroke Continuity provider, please refer to [email or web address]. After hours, contact General Neurology

## 2023-12-02 NOTE — Progress Notes (Signed)
 Orthopedic Tech Progress Note Patient Details:  Ian Wong 10-May-1961 [id number] Resting hand splint was applied to patient's left hand  Ortho Devices Type of Ortho Device: Wrist splint Ortho Device/Splint Location: LUE Ortho Device/Splint Interventions: Application   Post Interventions Patient Tolerated: Well  Massie BRAVO [name] 12/02/2023, 1:42 PM

## 2023-12-02 NOTE — Progress Notes (Addendum)
 NAME:  Ian Wong, MRN:  [id number], DOB:  05-Aug-1961, LOS: 2 ADMISSION DATE:  11/30/2023, CONSULTATION DATE:  11/30/23 REFERRING MD:  Ian Wong CHIEF COMPLAINT:  L weakness   History of Present Illness:  Pt is encephelopathic; therefore, this HPI is obtained from chart review.Ian Wong is a 62 y.o. male who has a PMH as below and who presented to [facility] ED 12/30 with R gaze deviation and L hemiplegia. He was last known well around 12:45PM that day. CTH demonstrated ASPECTS 10 with no large territory infarct or ICH. CTA demonstrated occlusion of R ICA with distal intracranial reconstitution and occlusion of R MCA. He was given TNKase  and transferred to [facility] for neuro IR intervention.  Upon arrival to [facility], he was taken to IR where he had diagnostic cerebral angiogram with mechanical thrombectomy, right carotid stenting and angioplasty with cerebral protection device.  While getting him intubated for IR procedure, he had a vomiting episode and there was concern for aspiration; therefore, post procedure, anesthesia opted to leave him intubated. PCCM was subsequently called for assistance with ventilator management.  Pertinent  Medical History:  has Cellulitis; Cellulitis of left lower extremity; PAD (peripheral artery disease) (HCC); Critical lower limb ischemia (HCC); Leriche syndrome (HCC); Aortoiliac occlusive disease (HCC); Incisional hernia, without obstruction or gangrene; Acute ischemic right MCA stroke (HCC); Acute right MCA stroke (HCC); Acute hypoxic respiratory failure (HCC); Acute metabolic encephalopathy; and Aspiration pneumonia (HCC) on their problem list.  Significant Hospital Events: Including procedures, antibiotic start and stop dates in addition to other pertinent events   12/30 to [facility] for diagnostic cerebral angiogram with mechanical thrombectomy, right carotid stenting and angioplasty with cerebral protection device, remained intubated post procedure. Head CT  12/30 (post procedure) > no abnormality, chronic small vessel disease.  Exam quality poor due to streak artifact MRI brain 12/31 > fairly extensive acute right MCA territory infarct, few small foci in the right basal ganglia that could reflect chronic microhemorrhage or acute petechial hemorrhage.  Few nonspecific punctate chronic microhemorrhages scattered elsewhere in the supratentorial brain.  Chronic left thalamic lacunar infarct 12/31 echocardiogram > LVEF 50-55%, normal diastolic function, normal RV size and function 12/31: Extubated  Interim History / Subjective:  Extubated yesterday without incident, 2 L/min Phenylephrine  60, sedation off I/O+ 1.7 L total BMP pending WBC 13   Objective:  Blood pressure (!) 137/53, pulse (!) 50, temperature (!) 100.4 F (38 C), temperature source Axillary, resp. rate 20, height 5' 11 (1.803 m), weight 96.8 kg, SpO2 97%.    Vent Mode: PSV;CPAP FiO2 (%):  [40 %] 40 % Set Rate:  [14 bmp-18 bmp] 14 bmp Vt Set:  [600 mL] 600 mL PEEP:  [5 cmH20] 5 cmH20 Pressure Support:  [5 cmH20] 5 cmH20 Plateau Pressure:  [15 cmH20-17 cmH20] 17 cmH20   Intake/Output Summary (Last 24 hours) at 12/02/2023 0746 Last data filed at 12/02/2023 0700 Gross per 24 hour  Intake 2753.1 ml  Output 2100 ml  Net 653.1 ml   Filed Weights   11/30/23 1442 11/30/23 1445  Weight: 99.5 kg 96.8 kg    Examination: General: Laying in bed, sleepy, no distress Neuro: Wakes to voice, opened eyes and answer simple questions.  Very weak cough.  Moved his right arm, bilateral lower extremities but not the left arm HEENT: Oropharynx clear but some upper airway noise and mucus that he has difficulty coughing up Cardiovascular: Regular, no murmur Lungs: Clear bilaterally Abdomen:, Nondistended with positive bowel sounds Musculoskeletal: No  edema Skin: No rash  Chest x-ray 1/1 reviewed by me showed some possible left basilar atelectasis without any overt infiltrate  Labs/imaging  personally reviewed:    Assessment & Plan:   R ICA and R MCA occlusion - s/p TNKase  followed by diagnostic cerebral angiogram with mechanical thrombectomy, right carotid stenting and angioplasty with cerebral protection device. -Postprocedure management as per IR and neurology -SBP goal 120-140, currently on phenylephrine  -Aspirin  81 mg, Brilinta  as ordered, atorvastatin  -PT/OT/SLP, remains n.p.o. for now  Hypertension, now hypotension on sedation -Wean phenylephrine  as able -SBP goal 120-140.  Cleviprex  available but has not been needed  Respiratory insufficiency - with concern for possible aspiration after vomiting episode event during intubation -Push pulmonary hygiene -Chest x-ray remains clear.  WBC 13.  If he weans off phenylephrine  then we will consider discontinuation Unasyn  in the absence of clear pneumonia  Hx HLD. -On atorvastatin   Hx Hypothyroidism. -On levothyroxine   At risk malnutrition -Will initiate tube feeding if he does not pass swallow evaluation  Best practice (evaluated daily):  Diet/type: NPO DVT prophylaxis: SCD Pressure ulcer(s): pressure ulcer assessment deferred  GI prophylaxis: PPI Lines: N/A Foley:  N/A Code Status:  full code Last date of multidisciplinary goals of care discussion: None yet.  Labs   CBC: Recent Labs  Lab 11/30/23 1424 11/30/23 1430 12/01/23 0646  WBC 8.0  --  12.7*  NEUTROABS 4.5  --   --   HGB 16.4 15.0 14.6  HCT 46.8 44.0 42.7  MCV 86.8  --  89.1  PLT 222  --  199    Basic Metabolic Panel: Recent Labs  Lab 11/30/23 1424 11/30/23 1430 12/01/23 0646  NA 135 139 139  K 3.7 3.8 4.2  CL 105 103 108  CO2 24  --  22  GLUCOSE 108* 106* 99  BUN 14 14 10   CREATININE 1.08 1.20 1.04  CALCIUM  9.0  --  8.1*  MG  --   --  1.8  PHOS  --   --  3.1   GFR: Estimated Creatinine Clearance: 88.5 mL/min (by C-G formula based on SCr of 1.04 mg/dL). Recent Labs  Lab 11/30/23 1424 11/30/23 2018 12/01/23 0646   PROCALCITON  --  <0.10 <0.10  WBC 8.0  --  12.7*    Liver Function Tests: Recent Labs  Lab 11/30/23 1424  AST 24  ALT 23  ALKPHOS 59  BILITOT 0.8  PROT 7.4  ALBUMIN  3.6   No results for input(s): LIPASE, AMYLASE in the last 168 hours. No results for input(s): AMMONIA in the last 168 hours.  ABG    Component Value Date/Time   PHART 7.376 01/07/2017 1258   PCO2ART 42.3 01/07/2017 1258   PO2ART 80.1 (L) 01/07/2017 1258   HCO3 24.6 01/07/2017 1258   TCO2 23 11/30/2023 1430   ACIDBASEDEF 0.1 01/07/2017 1258   O2SAT 95.5 01/07/2017 1258     Coagulation Profile: Recent Labs  Lab 11/30/23 1424  INR 1.1    Cardiac Enzymes: No results for input(s): CKTOTAL, CKMB, CKMBINDEX, TROPONINI in the last 168 hours.  HbA1C: Hgb A1c MFr Bld  Date/Time Value Ref Range Status  11/30/2023 08:18 PM 5.9 (H) 4.8 - 5.6 % Final    Comment:    (NOTE)         Prediabetes: 5.7 - 6.4         Diabetes: >6.4         Glycemic control for adults with diabetes: <7.0   01/03/2017 11:51 AM  5.4 4.8 - 5.6 % Final    Comment:    (NOTE)         Pre-diabetes: 5.7 - 6.4         Diabetes: >6.4         Glycemic control for adults with diabetes: <7.0     CBG: No results for input(s): GLUCAP in the last 168 hours.    Critical care time: 31 minutes    Lamar Chris, MD, PhD 12/02/2023, 7:46 AM [facility] [phone] or if no answer before 7:00PM call [phone] For any issues after 7:00PM please call eLink [phone]

## 2023-12-02 NOTE — Progress Notes (Signed)
 Keep systolic BP >100 per Dr. Salvadore Dom

## 2023-12-02 NOTE — Progress Notes (Addendum)
 eLink Physician-Brief Progress Note Patient Name: Ian Wong DOB: [birth year] MRN: [id number]   Date of Service  12/02/2023  HPI/Events of Note  63 yo male transfer from [facility] with L side weakness and R gaze deviation CT (+) R MCA 12/31 thrombectomy extubated 12/31   COVID panel negative.  eICU Interventions  Discontinue precautions.   0145 -intermittently bradycardic as low as 30s.  Currently on phenylephrine .  Will switch to norepinephrine  for chronotropic effect.  9768 -has urinary retention.  Rule out retention guidelines and In-N-Out cath as needed  Intervention Category Minor Interventions: Routine modifications to care plan (e.g. PRN medications for pain, fever)  Kaiel Weide 12/02/2023, 8:40 PM

## 2023-12-02 NOTE — Evaluation (Signed)
 Clinical/Bedside Swallow Evaluation Patient Details  Name: Ian Wong MRN: [id number] Date of Birth: June 08, 1961  Today's Date: 12/02/2023 Time: SLP Start Time (ACUTE ONLY): 0907 SLP Stop Time (ACUTE ONLY): 0918 SLP Time Calculation (min) (ACUTE ONLY): 11 min  Past Medical History:  Past Medical History:  Diagnosis Date   GERD (gastroesophageal reflux disease)    Hypothyroidism    Seizures (HCC)    Past Surgical History:  Past Surgical History:  Procedure Laterality Date   AORTA - BILATERAL FEMORAL ARTERY BYPASS GRAFT Bilateral 01/07/2017   Procedure: AORTOBIFEMORAL BYPASS GRAFT;  Surgeon: Krystal JULIANNA Doing, MD;  Location: [facility] OR;  Service: Vascular;  Laterality: Bilateral;   I & D EXTREMITY Left 11/28/2014   Procedure: IRRIGATION AND DEBRIDEMENT;  REPAIR OF CRUSH INJURY;  Surgeon: Franky Curia, MD;  Location: [facility] OR;  Service: Orthopedics;  Laterality: Left;   INCISIONAL HERNIA REPAIR N/A 10/26/2017   Procedure: HERNIA REPAIR INCISIONAL WITH MESH;  Surgeon: Mavis Anes, MD;  Location: [facility] ORS;  Service: General;  Laterality: N/A;   RADIOLOGY WITH ANESTHESIA N/A 11/30/2023   Procedure: IR WITH ANESTHESIA;  Surgeon: Radiologist, Medication, MD;  Location: [facility] OR;  Service: Radiology;  Laterality: N/A;   HPI:  63 yo male transfer from [facility] with L side weakness and R gaze deviation s/p TNK and thrombectomy 12/30. There was some concern for aspiration after vomiting episode during intubation so was kept on vent until 12/31. 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 PMH includes: GERD, hypothyroidism, seizures    Assessment / Plan / Recommendation  Clinical Impression  Pt is sleepy but cooperative, following most commands but sometimes slowly or with cues needed. At baseline he has audible congestion with a productive cough, using yankauer himself to clear thick secretions. His voice is dysphonic despite brief intubation. Anterior loss and intermittent coughing is noted during  thin liquid trials, not observed with thicker trials of purees. With purees, he has L buccal and anterior residue but responds well to cues to use a lingual sweep and second swallow to clear his oral cavity. Recommend a few ice chips at a time after oral care with nursing to help provide moisture and facilitate secretion management. Could crush meds in puree. SLP will f/u for potential to begin diet vs need for instrumental testing with pt likely benefitting from at least an additional day before pursuing either. SLP Visit Diagnosis: Dysphagia, unspecified (R13.10)    Aspiration Risk  Moderate aspiration risk    Diet Recommendation NPO except meds;Ice chips PRN after oral care    Medication Administration: Crushed with puree    Other  Recommendations Oral Care Recommendations: Oral care QID Caregiver Recommendations: Have oral suction available    Recommendations for follow up therapy are one component of a multi-disciplinary discharge planning process, led by the attending physician.  Recommendations may be updated based on patient status, additional functional criteria and insurance authorization.  Follow up Recommendations Acute inpatient rehab (3hours/day)      Assistance Recommended at Discharge    Functional Status Assessment Patient has had a recent decline in their functional status and demonstrates the ability to make significant improvements in function in a reasonable and predictable amount of time.  Frequency and Duration min 2x/week  2 weeks       Prognosis Prognosis for improved oropharyngeal function: Good Barriers to Reach Goals: Cognitive deficits      Swallow Study   General HPI: 63 yo male transfer from [facility] with L side weakness  and R gaze deviation s/p TNK and thrombectomy 12/30. There was some concern for aspiration after vomiting episode during intubation so was kept on vent until 12/31. 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 PMH includes: GERD,  hypothyroidism, seizures Type of Study: Bedside Swallow Evaluation Previous Swallow Assessment: none in chart Diet Prior to this Study: NPO Temperature Spikes Noted: Yes Respiratory Status: Nasal cannula History of Recent Intubation: Yes Total duration of intubation (days): 1 days Date extubated: 12/01/23 Behavior/Cognition: Lethargic/Drowsy;Cooperative;Requires cueing Oral Cavity Assessment: Within Functional Limits Oral Care Completed by SLP: No Oral Cavity - Dentition: Missing dentition Self-Feeding Abilities: Needs assist Patient Positioning: Upright in bed Baseline Vocal Quality: Hoarse;Low vocal intensity Volitional Cough: Strong;Congested Volitional Swallow: Able to elicit    Oral/Motor/Sensory Function Overall Oral Motor/Sensory Function: Mild impairment (following some commands, although a little sluggishly and not all) Facial ROM: Reduced left;Suspected CN VII (facial) dysfunction Facial Symmetry: Abnormal symmetry left;Suspected CN VII (facial) dysfunction Facial Strength: Reduced left;Suspected CN VII (facial) dysfunction   Ice Chips Ice chips: Within functional limits Presentation: Spoon   Thin Liquid Thin Liquid: Impaired Presentation: Cup;Self Fed;Spoon;Straw Oral Phase Impairments: Reduced labial seal Oral Phase Functional Implications: Left anterior spillage Pharyngeal  Phase Impairments: Cough - Delayed    Nectar Thick Nectar Thick Liquid: Not tested   Honey Thick Honey Thick Liquid: Not tested   Puree Puree: Impaired Presentation: Spoon Oral Phase Functional Implications: Oral residue;Left lateral sulci pocketing;Oral holding   Solid     Solid: Not tested      Leita SAILOR., M.A. CCC-SLP Acute Rehabilitation Services Office [phone]  Secure chat preferred  12/02/2023,9:38 AM

## 2023-12-02 NOTE — Progress Notes (Addendum)
 PCCM interval progress note  Mr. Ehrmann has had decreased wakefulness, responsiveness through the afternoon.  Has also developed a slight increase in upper airway noise although respiratory pattern looks comfortable.  No other focal neurological changes noted.  ABG reassuring as below.  He remains on phenylephrine .  SBP goal 120-140, have tried titrating down.  Vitals:   12/02/23 1430 12/02/23 1445 12/02/23 1500 12/02/23 1515  BP: (!) 87/47 (!) 81/38 (!) 121/36 (!) 108/47  Pulse: (!) 58 (!) 50 (!) 45 (!) 53  Resp: (!) 24 19 (!) 22 (!) 22  Temp:      TempSrc:      SpO2: 99% 98% 97% 100%  Weight:      Height:      Ill-appearing gentleman laying in bed in no distress.  No secretions.  He has some coarse upper airway noise on stethoscope evaluation.  Comfortable respiratory pattern.  No wheezes or crackles.  He will wake up although sleepy.  Opens eyes, follows commands and answers questions.  ABG    Component Value Date/Time   PHART 7.390 12/02/2023 1512   PCO2ART 35.6 12/02/2023 1512   PO2ART 98 12/02/2023 1512   HCO3 21.4 12/02/2023 1512   TCO2 22 12/02/2023 1512   ACIDBASEDEF 3.0 (H) 12/02/2023 1512   O2SAT 97 12/02/2023 1512     Right MCA stroke, right ICA and MCA occlusion treated with TNKase  and then mechanical thrombectomy with stent.  Now with some decreased wakefulness and change in mental status.  Suspect that this represents acute encephalopathy due to his stroke. Question whether there may be some relative hypoperfusion here, may need to uptitrate his phenylephrine .  He has a mild non-anion gap metabolic acidosis  Plans: -Stat head CT now to ensure no interval change -Will follow mental status closely and consider uptitrating phenylephrine  to different SBP or MAP goal to see if if this impacts wakefulness. -Will work to keep up with volume losses, high urine output noted -Have continued his Unasyn  given hypotension although no clear evidence for pneumonia on imaging. -Could  consider bicarbonate depending on BMP trend, overall trend  Critical care time: 40 minutes  Lamar Chris, MD, PhD 12/02/2023, 3:38 PM [facility] [phone] or if no answer before 7:00PM call [phone] For any issues after 7:00PM please call eLink [phone]    Addendum  I reviewed his head CT and exam with Dr Rosemarie.  Exam overall stable, but some evidence evolving stroke, possible hemorrhagic transformation, subtle shift on CT  Will start hypertonic saline per protocol   Lamar Chris, MD, PhD 12/02/2023, 4:00 PM [facility] [phone] or if no answer before 7:00PM call [phone] For any issues after 7:00PM please call eLink [phone]

## 2023-12-03 ENCOUNTER — Other Ambulatory Visit (HOSPITAL_COMMUNITY): Payer: Self-pay

## 2023-12-03 ENCOUNTER — Other Ambulatory Visit: Payer: Self-pay

## 2023-12-03 ENCOUNTER — Telehealth (HOSPITAL_COMMUNITY): Payer: Self-pay | Admitting: Pharmacy Technician

## 2023-12-03 LAB — CBC
HCT: 38.2 % — ABNORMAL LOW (ref 39.0–52.0)
Hemoglobin: 13.2 g/dL (ref 13.0–17.0)
MCH: 30.6 pg (ref 26.0–34.0)
MCHC: 34.6 g/dL (ref 30.0–36.0)
MCV: 88.6 fL (ref 80.0–100.0)
Platelets: 146 10*3/uL — ABNORMAL LOW (ref 150–400)
RBC: 4.31 MIL/uL (ref 4.22–5.81)
RDW: 13.1 % (ref 11.5–15.5)
WBC: 7.4 10*3/uL (ref 4.0–10.5)
nRBC: 0 % (ref 0.0–0.2)

## 2023-12-03 LAB — BASIC METABOLIC PANEL
Anion gap: 8 (ref 5–15)
BUN: 14 mg/dL (ref 8–23)
CO2: 18 mmol/L — ABNORMAL LOW (ref 22–32)
Calcium: 8 mg/dL — ABNORMAL LOW (ref 8.9–10.3)
Chloride: 119 mmol/L — ABNORMAL HIGH (ref 98–111)
Creatinine, Ser: 0.91 mg/dL (ref 0.61–1.24)
GFR, Estimated: >60 mL/min (ref >60)
Glucose, Bld: 112 mg/dL — ABNORMAL HIGH (ref 70–99)
Potassium: 3.7 mmol/L (ref 3.5–5.1)
Sodium: 145 mmol/L (ref 135–145)

## 2023-12-03 MED ORDER — DOCUSATE SODIUM 50 MG/5ML PO LIQD
100.0000 mg | Freq: Every day | ORAL | Status: DC
  Administered 2023-12-06 – 2023-12-11 (×5): 100 mg via ORAL
  Filled 2023-12-03 (×7): qty 10

## 2023-12-03 MED ORDER — IPRATROPIUM-ALBUTEROL 0.5-2.5 (3) MG/3ML IN SOLN
3.0000 mL | RESPIRATORY_TRACT | Status: DC | PRN
  Administered 2023-12-04: 3 mL via RESPIRATORY_TRACT
  Filled 2023-12-03: qty 3

## 2023-12-03 MED ORDER — ENOXAPARIN SODIUM 40 MG/0.4ML IJ SOSY
40.0000 mg | PREFILLED_SYRINGE | INTRAMUSCULAR | Status: DC
  Administered 2023-12-03 – 2023-12-11 (×9): 40 mg via SUBCUTANEOUS
  Filled 2023-12-03 (×9): qty 0.4

## 2023-12-03 MED ORDER — DOXAZOSIN MESYLATE 2 MG PO TABS
2.0000 mg | ORAL_TABLET | Freq: Every day | ORAL | Status: AC
Start: 2023-12-03 — End: 2023-12-05
  Administered 2023-12-03 – 2023-12-05 (×3): 2 mg via ORAL
  Filled 2023-12-03 (×3): qty 1

## 2023-12-03 MED ORDER — NOREPINEPHRINE 4 MG/250ML-% IV SOLN
0.0000 ug/min | INTRAVENOUS | Status: DC
  Administered 2023-12-03: 2 ug/min via INTRAVENOUS
  Filled 2023-12-03: qty 250

## 2023-12-03 MED ORDER — LACTATED RINGERS IV BOLUS
1000.0000 mL | Freq: Once | INTRAVENOUS | Status: AC
  Administered 2023-12-03: 1000 mL via INTRAVENOUS

## 2023-12-03 MED ORDER — DOXAZOSIN MESYLATE 2 MG PO TABS
2.0000 mg | ORAL_TABLET | Freq: Every day | ORAL | Status: DC
  Filled 2023-12-03: qty 1

## 2023-12-03 NOTE — Progress Notes (Signed)
 Asked to clarify BP goal by nursing Okay to target BP goal 120 - 140 at this time, further adjustment of goal per day team Regarding worsening bradycardia per nursing now going into high 30s occasionally, appreciate CCM assistance, exam otherwise stable per nursing  Lola Jernigan MD-PhD [facility] [phone] Available 7 PM to 7 AM, outside of these hours please call Neurologist on call as listed on Amion.   7 min of care, billing per day team

## 2023-12-03 NOTE — Progress Notes (Addendum)
 STROKE TEAM PROGRESS NOTE   [name] is a 63 y.o. male with past medical history significant for seizures, hypothyroidism, GERD who presented to [facility] ED 12/30 with persistent right gaze deviation and left hemiplegia.  His last known well was 1245 family did call EMS when his symptoms started.  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  Patient was given TNK at [facility] and was transferred to [facility] for emergent thrombectomy. On neurologic neurology exam on arrival to [facility] patient still had persistent left-sided weakness, right gaze, left-sided sensory deficit, left hemianopsia, left sensory neglect. He was then taken emergently to IR.  NIH 18 on admit  SIGNIFICANT HOSPITAL EVENTS 12/30: Transferred from [facility] to [facility] for emergent thrombectomy             Mechanical thrombectomy, right carotid stent and angioplasty  --> TICI 3             Left intubated postprocedure             Post procedure CT showed no hemorrhage. 12/31: MRI shows extensive acute right MCA territory infarcts with chronic microhemorrhages versus acute petechial hemorrhages.  1/1: Remains in ICU. Ongoing Neo for BP support. SLP NPO except for meds.  Interval history: Patient seen today in the ICU.  No family at bedside.  Patient remains extubated although has a fair amount of wheezing.  Did receive racemic epi after extubation on the 31st per pulmonology but is stable.  He was able to pass a swallow evaluation for medications and is undergoing a barium swallow soon.  He is on Levophed  drip as he had decline overnight and his pressure was pushed up some using Levophed  and he was taken for CT scan which was stable.  Neurological exam remains stable.  Vitals:   12/03/23 0900 12/03/23 1000 12/03/23 1100 12/03/23 1200  BP: (!) 127/45 (!) 141/55 (!) 117/38 (!) 106/43  Pulse: 60 (!) 57 (!) 57 (!) 58  Resp: 19 18 10 14   Temp:    98.9 F (37.2  C)  TempSrc:    Axillary  SpO2: 98% 98% 98% 97%  Weight:      Height:       CBC:  Recent Labs  Lab 11/30/23 1424 11/30/23 1430 12/02/23 0645 12/02/23 1512 12/03/23 0637  WBC 8.0   < > 13.0*  --  7.4  NEUTROABS 4.5  --   --   --   --   HGB 16.4   < > 13.6 11.6* 13.2  HCT 46.8   < > 38.8* 34.0* 38.2*  MCV 86.8   < > 87.6  --  88.6  PLT 222   < > 173  --  146*   < > = values in this interval not displayed.   Basic Metabolic Panel:  Recent Labs  Lab 12/01/23 0646 12/02/23 0645 12/02/23 1512 12/02/23 1619 12/02/23 2246 12/03/23 0637  NA 139 137 137   < > 141 145  K 4.2 3.7 3.6  --   --  3.7  CL 108 109  --   --   --  119*  CO2 22 21*  --   --   --  18*  GLUCOSE 99 106*  --   --   --  112*  BUN 10 10  --   --   --  14  CREATININE 1.04 0.93  --   --   --  0.91  CALCIUM  8.1* 8.0*  --   --   --  8.0*  MG 1.8 2.0  --   --   --   --   PHOS 3.1  --   --   --   --   --    < > = values in this interval not displayed.   Lipid Panel:  Recent Labs  Lab 12/01/23 0647  CHOL 112  TRIG 146  HDL 26*  CHOLHDL 4.3  VLDL 29  LDLCALC 57   HgbA1c:  Recent Labs  Lab 11/30/23 2018  HGBA1C 5.9*   Urine Drug Screen: No results for input(s): LABOPIA, COCAINSCRNUR, LABBENZ, AMPHETMU, THCU, LABBARB in the last 168 hours.  Alcohol Level  Recent Labs  Lab 11/30/23 1424  ETH <10    IMAGING past 24 hours US  EKG SITE RITE Result Date: 12/03/2023 If Site Rite image not attached, placement could not be confirmed due to current cardiac rhythm.  CT HEAD WO CONTRAST ( ) Result Date: 12/02/2023 CLINICAL DATA:  Neuro deficit, acute, stroke suspected. EXAM: CT HEAD WITHOUT CONTRAST TECHNIQUE: Contiguous axial images were obtained from the base of the skull through the vertex without intravenous contrast. RADIATION DOSE REDUCTION: This exam was performed according to the departmental dose-optimization program which includes automated exposure control, adjustment of the mA and/or  kV according to patient size and/or use of iterative reconstruction technique. COMPARISON:  MRI brain 12/01/2023.  Head CT 11/30/2023. FINDINGS: Brain: Evolving large acute right MCA territory infarct with increasing mass effect on the right lateral ventricle. No acute hemorrhage or midline shift. Stable background of moderate chronic small-vessel disease. No hydrocephalus or extra-axial collection. Vascular: No hyperdense vessel or unexpected calcification. Skull: No calvarial fracture or suspicious bone lesion. Skull base is unremarkable. Sinuses/Orbits: Mild mucosal thickening in the right sphenoid sinus. Orbits are unremarkable. Other: None. IMPRESSION: Evolving large acute right MCA territory infarct with increasing mass effect on the right lateral ventricle. No acute hemorrhage or midline shift. Electronically Signed   By: Ryan Chess M.D.   On: 12/02/2023 16:39    PHYSICAL EXAM Temp:  [97.9 F (36.6 C)-101 F (38.3 C)] 98.9 F (37.2 C) (01/02 1200) Pulse Rate:  [40-72] 58 (01/02 1200) Resp:  [10-22] 14 (01/02 1200) BP: (83-150)/(35-84) 106/43 (01/02 1200) SpO2:  [97 %-100 %] 97 % (01/02 1200)  General - Well nourished, well developed middle-age Caucasian male, in no apparent distress. HEENT- atraumatic, normocephalic  Cardiovascular - Regular rhythm and rate. Pulmonary- equal chest rise; unlabored respirations.  Has rhonchi bilaterally and expiratory wheezing.  Neuro- He is awake and alert today.  He is following commands.  He has a right gaze preference but can cross midline.  Pupils are equal and round and reactive to light.  He has mild left facial weakness. Still with nonfluent speech but able to answer questions with brief response with mild dysarthria. He is antigravity strength on bilateral lower extremities.  His left upper extremity is mostly flaccid.  Only flicker movement with pain.  Right upper arm is antigravity. Gait deferred.   ASSESSMENT/PLAN [name]  is a 63 y.o. male with history of GERD, Hypothyroid, and seizures presenting with acute stroke.   Acute Ischemic Infarct:  right ICA and MCA distribution s/p mechanical thrombectomy and TNK with TICI 3 revascularization with use of rescue right carotid stent Etiology:  large vessel disease, with Right ICA and MCA occlusion  Code Stroke CT head  negative for a large hypodensity concerning for  a large territory infarct or hyperdensity concerning for an ICH  CTA head & neck  R ICA occlusion with distal intracranial reconstitution before occlusion of the proximal right MCA M1 with poor opacification of the distal vessels.  Post IR CT: No hemorrhage MRI:  Fairly extensive acute right MCA territory infarcts Chronic microhemorrhages versus acute petechial hemorrhages seen along the right basal ganglia Chronic lacunar infarct within the left thalamus Carotid Doppler: PENDING 2D Echo: LVEF 50 to 55% LDL 57 HgbA1c 5.9% VTE prophylaxis - SCDs aspirin  81 mg daily prior to admission, now on aspirin  81 mg daily and Brilinta  (ticagrelor ) 90 mg bid  Therapy recommendations:  CIR for speech, CIR for PT per PT recs. Skilled OT per OT recs. Disposition:  Pending; likely CIR Exam improved today despite not being at goal with sodium.  Can discontinue hypertonic saline.  BMP daily ok.   Essential Hypertension Home meds:  none Unstable SBP goal above 110 Wean levo as able for above goals.   Hyperlipidemia Home meds: Lipitor 20 mg, resumed in hospital LDL 57, goal < 70 Continue statin at discharge   Tobacco Abuse Former Cigarette Smoker   Dysphagia Patient has post-stroke dysphagia, SLP consulted Patient for MBS NPO except ice and meds crushed with puree. Recommended CIR for ongoing ST needs Advance diet as tolerated   Other Stroke Risk Factors Obesity, Body mass index is 29.75 kg/m., BMI >/= 30 associated with increased stroke risk, recommend weight loss, diet and exercise as appropriate     Other Active Problems Hypothyroidism Hx of Seizures, no AED at home  Hospital day # 3  Jackolyn JULIANNA Chute, NP  STROKE MD NOTE :  I have personally obtained history,examined this patient, reviewed notes, independently viewed imaging studies, participated in medical decision making and plan of care.ROS completed by me personally and pertinent positives fully documented  I have made any additions or clarifications directly to the above note. Agree with note above.  Patient neurologically remained stable but was bradycardic yesterday night and blood pressure was running low so has been placed on low-dose vasopressors.  He is still not able to swallow very well and is only allowed medications.  Continue ongoing physical occupational and speech therapy.  Mobilize out of bed.  Try to wean vasopressin drip to keep systolic greater than 110.  No family at the bedside for discussion today.  Discussed with Dr. Shelah critical care medicine This patient is critically ill and at significant risk of neurological worsening, death and care requires constant monitoring of vital signs, hemodynamics,respiratory and cardiac monitoring, extensive review of multiple databases, frequent neurological assessment, discussion with family, other specialists and medical decision making of high complexity.I have made any additions or clarifications directly to the above note.This critical care time does not reflect procedure time, or teaching time or supervisory time of PA/NP/Med Resident etc but could involve care discussion time.  I spent 30 minutes of neurocritical care time  in the care of  this patient.     Eather Popp, MD Medical Director [facility] Stroke Center Pager: [phone] 12/03/2023 5:07 PM  To contact Stroke Continuity provider, please refer to [email or web address]. After hours, contact General Neurology

## 2023-12-03 NOTE — Progress Notes (Signed)
 Physical Therapy Treatment Patient Details Name: [facility] MRN: [id number] DOB: 03-31-1961 Today's Date: 12/03/2023   History of Present Illness 63 yo male transfer from [facility] with L side weakness and R gaze deviation CT (+) R MCA 12/31 thrombectomy  extubated 12/31 PMH GERD hypothyroidism    PT Comments  The pt is making good functional gains already. He was able to progress to only needing modAx1 for sit to stand and step pivot transfers with R HHA this date. He does display L knee instability but it did not fully buckle, provided knee block for safety though. He needs multi-modal cues to shift his weight to advance each foot to step pivot to the R from the EOB to the recliner. He continues to display L inattention and needs repeated multi-modal cues to find and maintain midline when upright. He initially was pushing himself to the L with his R UE when sitting EOB, but this quickly improved after pt was propped onto his R elbow sitting EOB. Attempted to facilitate L UE muscular activation through propping him on his L elbow and cuing him to push up through his L hand to sit upright again when in the recliner, but little to no activation noted in his L UE at this time. Will continue to follow acutely.     If plan is discharge home, recommend the following: A lot of help with bathing/dressing/bathroom;Assistance with cooking/housework;Assist for transportation;Help with stairs or ramp for entrance;Two people to help with walking and/or transfers;Direct supervision/assist for medications management;Direct supervision/assist for financial management   Can travel by private vehicle        Equipment Recommendations  Other (comment) (TBD)    Recommendations for Other Services Rehab consult     Precautions / Restrictions Precautions Precautions: Fall Precaution Comments: L inattention Restrictions Weight Bearing Restrictions Per Provider Order: No     Mobility  Bed Mobility Overal bed  mobility: Needs Assistance Bed Mobility: Supine to Sit     Supine to sit: Mod assist, HOB elevated     General bed mobility comments: Pt needed repeated verbal and tactile cues to attend to his L leg to fully bring it off L EOB then to attend to his L UE to ensure its safe placement with transitioning supine to sit L EOB. ModA needed at trunk to ascend trunk and pivot on his hips to sit EOB    Transfers Overall transfer level: Needs assistance Equipment used: 1 person hand held assist Transfers: Sit to/from Stand, Bed to chair/wheelchair/BSC Sit to Stand: Mod assist   Step pivot transfers: Mod assist       General transfer comment: Cued pt to hold onto therapist anterior to him to stand up from EOB 1x and from recliner 2x. L knee blocked for safety. ModA required to power pt up to stand and gain balance each rep. ModA needed along with verbal and tactile cues to shift his weight bil, block his L knee during L stance phase, and advance each foot to step pivot to R from bed to recliner with HHA. Good initiation by pt to attempt to take steps    Ambulation/Gait Ambulation/Gait assistance: Mod assist Gait Distance (Feet): 1 Feet Assistive device: 1 person hand held assist Gait Pattern/deviations: Step-to pattern, Decreased step length - right, Decreased step length - left, Decreased stride length, Decreased weight shift to left, Decreased weight shift to right, Trunk flexed, Knee flexed in stance - left Gait velocity: reduced Gait velocity interpretation: <1.31 ft/sec, indicative of  household ambulator   General Gait Details: ModA needed along with verbal and tactile cues to shift his weight bil, block his L knee during L stance phase, and advance each foot to step pivot to R from bed to recliner with HHA. Good initiation by pt to attempt to take steps   Stairs             Wheelchair Mobility     Tilt Bed    Modified Rankin (Stroke Patients Only) Modified Rankin (Stroke  Patients Only) Pre-Morbid Rankin Score: No symptoms Modified Rankin: Severe disability     Balance Overall balance assessment: Needs assistance Sitting-balance support: Single extremity supported, Feet supported Sitting balance-Leahy Scale: Poor Sitting balance - Comments: Pt with tendency to push himself to his L with his R, this pushing tendency reduced after pt was propped on his R elbow while sitting EOB, CGA-minA for static sitting balance. Repeated verbal and visual cues provided for pt to find and maintain midline while sitting EOB by trying to match his body up with the therapist's anterior to him Postural control: Left lateral lean Standing balance support: Single extremity supported, During functional activity Standing balance-Leahy Scale: Poor Standing balance comment: L lean, modA for standing balance with R UE support                            Cognition Arousal: Alert Behavior During Therapy: Flat affect Overall Cognitive Status: Impaired/Different from baseline Area of Impairment: Following commands, Attention, Memory, Safety/judgement, Awareness, Problem solving                   Current Attention Level: Sustained Memory: Decreased short-term memory Following Commands: Follows one step commands with increased time, Follows one step commands inconsistently Safety/Judgement: Decreased awareness of deficits, Decreased awareness of safety Awareness: Intellectual Problem Solving: Slow processing, Decreased initiation, Difficulty sequencing, Requires verbal cues, Requires tactile cues General Comments: Pt needed repeated cues to look to his L and keep his eyes on his L extremities. Slow to process cues and benefits from multi-modal cues. Needs cues to ensure he attends to his L extremities when mobilizing to ensure they safety        Exercises Other Exercises Other Exercises: sit <> stand 3x with modA Other Exercises: pt propping onto L elbow then cued  to push through L UE to ascend trunk when sitting in recliner, x5 reps with AAROM (little to no activation noted through L UE)    General Comments General comments (skin integrity, edema, etc.): SBP initially dropped from 120s to 90s upon sitting EOB, improved to 110s upon transferring to recliner; SpO2 stable on RA but redonned pt on 2L for pt comfort due to noted wheezing; encouraged wife to provide tactile stimulation to pt's L extremities and sit to his L to encourage L attention, she verbalized understanding      Pertinent Vitals/Pain Pain Assessment Pain Assessment: Faces Faces Pain Scale: Hurts little more Pain Location: generalized with mobility Pain Descriptors / Indicators: Discomfort, Grimacing Pain Intervention(s): Monitored during session, Limited activity within patient's tolerance, Repositioned    Home Living                          Prior Function            PT Goals (current goals can now be found in the care plan section) Acute Rehab PT Goals Patient Stated Goal: to get back  home PT Goal Formulation: With patient/family Time For Goal Achievement: 12/16/23 Potential to Achieve Goals: Good Progress towards PT goals: Progressing toward goals    Frequency    Min 4X/week      PT Plan      Co-evaluation              AM-PAC PT 6 Clicks Mobility   Outcome Measure  Help needed turning from your back to your side while in a flat bed without using bedrails?: A Lot Help needed moving from lying on your back to sitting on the side of a flat bed without using bedrails?: A Lot Help needed moving to and from a bed to a chair (including a wheelchair)?: A Lot Help needed standing up from a chair using your arms (e.g., wheelchair or bedside chair)?: A Lot Help needed to walk in hospital room?: Total Help needed climbing 3-5 steps with a railing? : Total 6 Click Score: 10    End of Session Equipment Utilized During Treatment: Gait belt Activity  Tolerance: Patient tolerated treatment well;Patient limited by fatigue Patient left: in chair;with call bell/phone within reach;with chair alarm set;with family/visitor present Nurse Communication: Mobility status;Need for lift equipment;Other (comment) (pt wanting ice chips) PT Visit Diagnosis: Unsteadiness on feet (R26.81);Other abnormalities of gait and mobility (R26.89);Muscle weakness (generalized) (M62.81);Other symptoms and signs involving the nervous system (R29.898);Hemiplegia and hemiparesis;Difficulty in walking, not elsewhere classified (R26.2) Hemiplegia - Right/Left: Left Hemiplegia - dominant/non-dominant: Non-dominant Hemiplegia - caused by: Other cerebrovascular disease     Time: 8557-8482 PT Time Calculation (min) (ACUTE ONLY): 35 min  Charges:    $Therapeutic Activity: 8-22 mins $Neuromuscular Re-education: 8-22 mins PT General Charges $$ ACUTE PT VISIT: 1 Visit                     Theo Ferretti, PT, DPT Acute Rehabilitation Services  Office: [phone]    Theo CHRISTELLA Ferretti 12/03/2023, 6:16 PM

## 2023-12-03 NOTE — Progress Notes (Signed)
 Speech Language Pathology Treatment: Dysphagia  Patient Details Name: [facility] MRN: [id number] DOB: [birth year] Today's Date: 12/03/2023 Time: 9076-9061 SLP Time Calculation (min) (ACUTE ONLY): 15 min  Assessment / Plan / Recommendation Clinical Impression  Pt was seen for f/u after repeat CT overnight, now on 3% saline. He has less anterior loss and oral residue today compared to previous date, but he also has more consistent coughing with thin liquids. Note that at times his cough sounds more subtle, and SLP cued him to cough harder if he felt like he needed to cough. Mod cues were also given for pacing, as pt was a little impulsive even when verbally instructed to take one sip at a time. Pt's WOB seemed a little increased today, especially after PO trials, with more accessory muscle use, although pt still on room air (discussed with RN). Given persistent signs concerning for aspiration, instrumental swallow study is recommended. Discussed plan with RN - will plan for next date at the earliest to give him even a little more time before attempting.   HPI HPI: 63 yo male transfer from [facility] with L side weakness and R gaze deviation s/p TNK and thrombectomy 12/30. There was some concern for aspiration after vomiting episode during intubation so was kept on vent until 12/31. 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 Repeate CT 1/1 due to decreased wakefulness showed increasing mass effect on the R lateral ventricle.  PMH includes: GERD, hypothyroidism, seizures      SLP Plan  Continue with current plan of care      Recommendations for follow up therapy are one component of a multi-disciplinary discharge planning process, led by the attending physician.  Recommendations may be updated based on patient status, additional functional criteria and insurance authorization.    Recommendations  Diet recommendations: NPO;Other(comment) (ice chips after oral care) Medication  Administration: Crushed with puree                  Oral care QID   Frequent or constant Supervision/Assistance Dysphagia, unspecified (R13.10)     Continue with current plan of care     Leita SAILOR., M.A. CCC-SLP Acute Rehabilitation Services Office [phone]  Secure chat preferred   12/03/2023, 10:55 AM

## 2023-12-03 NOTE — Progress Notes (Signed)
 NAME:  Ian Wong, MRN:  [id number], DOB:  November 19, 1961, LOS: 3 ADMISSION DATE:  11/30/2023, CONSULTATION DATE:  11/30/23 REFERRING MD:  everitt Sarks Rodgriguez CHIEF COMPLAINT:  L weakness   History of Present Illness:  Pt is encephelopathic; therefore, this HPI is obtained from chart review.Ian Wong is a 63 y.o. male who has a PMH as below and who presented to [facility] 12/30 with R gaze deviation and L hemiplegia. He was last known well around 12:45PM that day. CTH demonstrated ASPECTS 10 with no large territory infarct or ICH. CTA demonstrated occlusion of R ICA with distal intracranial reconstitution and occlusion of R MCA. He was given TNKase  and transferred to [facility] for neuro IR intervention.  Upon arrival to [facility], he was taken to IR where he had diagnostic cerebral angiogram with mechanical thrombectomy, right carotid stenting and angioplasty with cerebral protection device.  While getting him intubated for IR procedure, he had a vomiting episode and there was concern for aspiration; therefore, post procedure, anesthesia opted to leave him intubated. PCCM was subsequently called for assistance with ventilator management.  Pertinent  Medical History:  has Cellulitis; Cellulitis of left lower extremity; PAD (peripheral artery disease) (HCC); Critical lower limb ischemia (HCC); Leriche syndrome (HCC); Aortoiliac occlusive disease (HCC); Incisional hernia, without obstruction or gangrene; Acute ischemic right MCA stroke (HCC); Acute right MCA stroke (HCC); Acute hypoxic respiratory failure (HCC); Acute metabolic encephalopathy; and Aspiration pneumonia (HCC) on their problem list.  Significant Hospital Events: Including procedures, antibiotic start and stop dates in addition to other pertinent events   12/30 to [facility] for diagnostic cerebral angiogram with mechanical thrombectomy, right carotid stenting and angioplasty with cerebral protection device, remained intubated post procedure. Head CT  12/30 (post procedure) > no abnormality, chronic small vessel disease.  Exam quality poor due to streak artifact MRI brain 12/31 > fairly extensive acute right MCA territory infarct, few small foci in the right basal ganglia that could reflect chronic microhemorrhage or acute petechial hemorrhage.  Few nonspecific punctate chronic microhemorrhages scattered elsewhere in the supratentorial brain.  Chronic left thalamic lacunar infarct 12/31 echocardiogram > LVEF 50-55%, normal diastolic function, normal RV size and function 12/31: Extubated 1/1 head CT >> evolving large right MCA territory infarct with increased mass effect on the right lateral ventricle, no acute hemorrhage 1/1: 3% saline initiated  Interim History / Subjective:  Some decreased wakefulness on 1/1 and mass effect noted on head CT.  3% saline initiated, Na 137  > 145 Phenylephrine  was changed to norepinephrine  as the patient had bradycardia overnight, currently at 1 I/O+ 2.5 L total Mild non-anion gap metabolic acidosis persists Foley removed 1/1 but no urine output, has required I/O cath x 3    Objective:  Blood pressure 111/84, pulse (!) 50, temperature 98.3 F (36.8 C), temperature source Oral, resp. rate 16, height 5' 11 (1.803 m), weight 96.8 kg, SpO2 98%.        Intake/Output Summary (Last 24 hours) at 12/03/2023 0851 Last data filed at 12/03/2023 0800 Gross per 24 hour  Intake 2027.67 ml  Output 1245 ml  Net 782.67 ml   Filed Weights   11/30/23 1442 11/30/23 1445  Weight: 99.5 kg 96.8 kg    Examination: General: Appearing man, more awake today Neuro: Eyes open, tracks, answers questions and follows commands.  Still with a weak voice and weak cough.  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 -Appreciate IR and neurology management -Continue 3% saline 75 cc/h and follow frequent sodium -Aspirin  81 mg, Brilinta  as ordered, atorvastatin  -SBP goal 120-140, norepinephrine  available, currently off -PT/OT/SLP  History of hypertension Relative hypotension with SBP goal 120-140 for CVA -Cleviprex  available but is not requiring -Continue norepinephrine , currently at 1 for SBP goal yes  Respiratory insufficiency - with concern for possible aspiration after vomiting episode event during intubation -Has intermittently had some upper airway secretions, continue push pulmonary hygiene -Continue Unasyn , plan for 5-day course.  Chest x-ray was reassuring but he does remain on pressors  Hx HLD. -Continue atorvastatin   Hx Hypothyroidism. -Continue levothyroxine   At risk malnutrition -Appreciate SLP evaluation.  Hopefully we can get a diet started soon -Tube feeding until he can take p.o.  Urinary retention -Foley removed on 1/1, no urine output and has required I/O cath x 3 -Starting doxazosin  per tube, retention protocol.  Voiding trial in 2 days  Best practice (evaluated daily):  Diet/type: NPO DVT prophylaxis: SCD Pressure ulcer(s): pressure ulcer assessment deferred  GI prophylaxis: PPI Lines: N/A Foley:  N/A Code Status:  full code Last date of multidisciplinary goals of care discussion: None yet.  Labs   CBC: Recent Labs  Lab 11/30/23 1424 11/30/23 1430 12/01/23 0646 12/02/23 0645 12/02/23 1512 12/03/23 0637  WBC 8.0  --  12.7* 13.0*  --  7.4   NEUTROABS 4.5  --   --   --   --   --   HGB 16.4 15.0 14.6 13.6 11.6* 13.2  HCT 46.8 44.0 42.7 38.8* 34.0* 38.2*  MCV 86.8  --  89.1 87.6  --  88.6  PLT 222  --  199 173  --  146*    Basic Metabolic Panel: Recent Labs  Lab 11/30/23 1424 11/30/23 1430 12/01/23 0646 12/02/23 0645 12/02/23 1512 12/02/23 1619 12/02/23 2246 12/03/23 0637  NA 135 139 139 137 137 140 141 145  K 3.7 3.8 4.2 3.7 3.6  --   --  3.7  CL 105 103 108 109  --   --   --  119*  CO2 24  --  22 21*  --   --   --  18*  GLUCOSE 108* 106* 99 106*  --   --   --  112*  BUN 14 14 10 10   --   --   --  14  CREATININE 1.08 1.20 1.04 0.93  --   --   --  0.91  CALCIUM  9.0  --  8.1* 8.0*  --   --   --  8.0*  MG  --   --  1.8 2.0  --   --   --   --   PHOS  --   --  3.1  --   --   --   --   --    GFR: Estimated Creatinine Clearance: 101.2 mL/min (by C-G formula based on SCr of 0.91 mg/dL). Recent Labs  Lab 11/30/23 1424 11/30/23 2018 12/01/23 9353 12/02/23 0645  12/03/23 0637  PROCALCITON  --  <0.10 <0.10  --   --   WBC 8.0  --  12.7* 13.0* 7.4    Liver Function Tests: Recent Labs  Lab 11/30/23 1424  AST 24  ALT 23  ALKPHOS 59  BILITOT 0.8  PROT 7.4  ALBUMIN  3.6   No results for input(s): LIPASE, AMYLASE in the last 168 hours. No results for input(s): AMMONIA in the last 168 hours.  ABG    Component Value Date/Time   PHART 7.390 12/02/2023 1512   PCO2ART 35.6 12/02/2023 1512   PO2ART 98 12/02/2023 1512   HCO3 21.4 12/02/2023 1512   TCO2 22 12/02/2023 1512   ACIDBASEDEF 3.0 (H) 12/02/2023 1512   O2SAT 97 12/02/2023 1512     Coagulation Profile: Recent Labs  Lab 11/30/23 1424  INR 1.1    Cardiac Enzymes: No results for input(s): CKTOTAL, CKMB, CKMBINDEX, TROPONINI in the last 168 hours.  HbA1C: Hgb A1c MFr Bld  Date/Time Value Ref Range Status  11/30/2023 08:18 PM 5.9 (H) 4.8 - 5.6 % Final    Comment:    (NOTE)         Prediabetes: 5.7 - 6.4         Diabetes: >6.4          Glycemic control for adults with diabetes: <7.0   01/03/2017 11:51 AM 5.4 4.8 - 5.6 % Final    Comment:    (NOTE)         Pre-diabetes: 5.7 - 6.4         Diabetes: >6.4         Glycemic control for adults with diabetes: <7.0     CBG: Recent Labs  Lab 11/30/23 1420  GLUCAP 116*      Critical care time: 33 minutes    Lamar Chris, MD, PhD 12/03/2023, 8:51 AM [facility] [phone] or if no answer before 7:00PM call [phone] For any issues after 7:00PM please call eLink [phone]

## 2023-12-03 NOTE — Progress Notes (Signed)
 Inpatient Rehab Admissions Coordinator Note:   Per PT recommendations patient was screened for CIR candidacy by Reche FORBES Lowers, PT. At this time, pt appears to be a potential candidate for CIR. I will place an order for rehab consult for full assessment, per our protocol.  Please contact me any with questions.SABRA Reche Lowers, PT, DPT [phone] 12/03/23 3:28 PM

## 2023-12-03 NOTE — Plan of Care (Signed)
  Problem: Clinical Measurements: Goal: Ability to maintain clinical measurements within normal limits will improve Outcome: Progressing Goal: Diagnostic test results will improve Outcome: Progressing Goal: Respiratory complications will improve Outcome: Progressing Goal: Cardiovascular complication will be avoided Outcome: Progressing   Problem: Activity: Goal: Risk for activity intolerance will decrease Outcome: Progressing   Problem: Coping: Goal: Level of anxiety will decrease Outcome: Progressing   Problem: Pain Management: Goal: General experience of comfort will improve Outcome: Progressing   Problem: Safety: Goal: Ability to remain free from injury will improve Outcome: Progressing   Problem: Ischemic Stroke/TIA Tissue Perfusion: Goal: Complications of ischemic stroke/TIA will be minimized Outcome: Progressing   Problem: Health Behavior/Discharge Planning: Goal: Goals will be collaboratively established with patient/family Outcome: Progressing   Problem: Nutrition: Goal: Risk of aspiration will decrease Outcome: Progressing   Problem: Cardiovascular: Goal: Vascular access site(s) Level 0-1 will be maintained Outcome: Progressing   Problem: Respiratory: Goal: Ability to maintain a clear airway and adequate ventilation will improve Outcome: Progressing   Problem: Role Relationship: Goal: Method of communication will improve Outcome: Progressing

## 2023-12-03 NOTE — Telephone Encounter (Signed)
 Patient Product/process Development Scientist completed.    The patient is insured through  [employer] . Patient has [employer], may use a copay card, and/or apply for patient assistance if available.    Ran test claim for Brilinta  90 mg and the current 30 day co-pay is $40.00.   This test claim was processed through [facility]- copay amounts may vary at other pharmacies due to pharmacy/plan contracts, or as the patient moves through the different stages of their insurance plan.     Reyes Sharps, CPHT Pharmacy Technician III Certified Patient Advocate [facility] Patient Advocate Team Direct Number: [phone]  Fax: [phone]

## 2023-12-03 NOTE — Progress Notes (Addendum)
 eLink Physician-Brief Progress Note Patient Name: Ian Wong DOB: [birth year] MRN: [id number]   Date of Service  12/03/2023  HPI/Events of Note  63 yo male transfer from [facility] with L side weakness and R gaze deviation CT (+) R MCA 12/31 thrombectomy extubated 12/31   +Expiratory wheezing  eICU Interventions  Add PRN SVNs   0209 -initially improved wheezing with DuoNeb's but concerned that the wheezing may actually be upper airway related.  Previously received racemic epi.  In no distress and saturating appropriately.  No intervention indicated.  Intervention Category Intermediate Interventions: Respiratory distress - evaluation and management  Serafina Topham 12/03/2023, 9:36 PM

## 2023-12-03 NOTE — Progress Notes (Signed)
 MD was laerted that bp was below parameters (106/43(62)).  Was given orders to turn levophed back on and to give LR bolus.

## 2023-12-04 ENCOUNTER — Inpatient Hospital Stay (HOSPITAL_COMMUNITY): Payer: Medicaid Other

## 2023-12-04 LAB — CBC
HCT: 38.5 % — ABNORMAL LOW (ref 39.0–52.0)
Hemoglobin: 13.7 g/dL (ref 13.0–17.0)
MCH: 30.7 pg (ref 26.0–34.0)
MCHC: 35.6 g/dL (ref 30.0–36.0)
MCV: 86.3 fL (ref 80.0–100.0)
Platelets: 172 10*3/uL (ref 150–400)
RBC: 4.46 MIL/uL (ref 4.22–5.81)
RDW: 12.9 % (ref 11.5–15.5)
WBC: 7 10*3/uL (ref 4.0–10.5)
nRBC: 0 % (ref 0.0–0.2)

## 2023-12-04 LAB — BASIC METABOLIC PANEL
Anion gap: 8 (ref 5–15)
BUN: 16 mg/dL (ref 8–23)
CO2: 19 mmol/L — ABNORMAL LOW (ref 22–32)
Calcium: 8.3 mg/dL — ABNORMAL LOW (ref 8.9–10.3)
Chloride: 115 mmol/L — ABNORMAL HIGH (ref 98–111)
Creatinine, Ser: 0.93 mg/dL (ref 0.61–1.24)
GFR, Estimated: >60 mL/min (ref >60)
Glucose, Bld: 84 mg/dL (ref 70–99)
Potassium: 4 mmol/L (ref 3.5–5.1)
Sodium: 142 mmol/L (ref 135–145)

## 2023-12-04 LAB — MAGNESIUM: Magnesium: 2.2 mg/dL (ref 1.7–2.4)

## 2023-12-04 MED ORDER — IPRATROPIUM-ALBUTEROL 0.5-2.5 (3) MG/3ML IN SOLN
3.0000 mL | Freq: Four times a day (QID) | RESPIRATORY_TRACT | Status: DC
  Administered 2023-12-04: 3 mL via RESPIRATORY_TRACT
  Filled 2023-12-04: qty 3

## 2023-12-04 MED ORDER — FREE WATER
250.0000 mL | Freq: Four times a day (QID) | Status: DC
Start: 2023-12-04 — End: 2023-12-09
  Administered 2023-12-04 – 2023-12-09 (×20): 250 mL

## 2023-12-04 MED ORDER — BISACODYL 10 MG RE SUPP
10.0000 mg | Freq: Once | RECTAL | Status: DC
  Filled 2023-12-04: qty 1

## 2023-12-04 MED ORDER — PROSOURCE TF20 ENFIT COMPATIBL EN LIQD
60.0000 mL | Freq: Two times a day (BID) | ENTERAL | Status: DC
  Administered 2023-12-04 – 2023-12-11 (×15): 60 mL
  Filled 2023-12-04 (×15): qty 60

## 2023-12-04 MED ORDER — IPRATROPIUM-ALBUTEROL 0.5-2.5 (3) MG/3ML IN SOLN
3.0000 mL | Freq: Four times a day (QID) | RESPIRATORY_TRACT | Status: DC
  Administered 2023-12-04 – 2023-12-06 (×8): 3 mL via RESPIRATORY_TRACT
  Filled 2023-12-04 (×9): qty 3

## 2023-12-04 MED ORDER — BISACODYL 10 MG RE SUPP
10.0000 mg | Freq: Once | RECTAL | Status: AC
  Administered 2023-12-04: 10 mg via RECTAL

## 2023-12-04 MED ORDER — PREDNISONE 20 MG PO TABS
40.0000 mg | ORAL_TABLET | Freq: Every day | ORAL | Status: DC
  Administered 2023-12-04 – 2023-12-06 (×3): 40 mg via ORAL
  Filled 2023-12-04 (×3): qty 2

## 2023-12-04 MED ORDER — OSMOLITE 1.5 CAL PO LIQD
1000.0000 mL | ORAL | Status: DC
Start: 2023-12-04 — End: 2023-12-08
  Administered 2023-12-04 – 2023-12-08 (×4): 1000 mL
  Filled 2023-12-04: qty 1000

## 2023-12-04 NOTE — Evaluation (Signed)
 Modified Barium Swallow Study  Patient Details  Name: Ian Wong MRN: [id number] Date of Birth: 1961-05-22  Today's Date: 12/04/2023  Modified Barium Swallow completed.  Full report located under Chart Review in the Imaging Section.  History of Present Illness 63 yo male transfer from [facility] with L side weakness and R gaze deviation s/p TNK and thrombectomy 12/30. There was some concern for aspiration after vomiting episode during intubation so was kept on vent until 12/31. 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 Repeate CT 1/1 due to decreased wakefulness showed increasing mass effect on the R lateral ventricle.  PMH includes: GERD, hypothyroidism, seizures   Clinical Impression Pt has a moderate oropharyngeal dysphagia including reduced labial seal with anterior loss on the L side and slow posterior lingual transit. He has adequate oral and pharyngeal clearance. He has reduced laryngeal elevation and laryngeal vestibule closure but he can still protect his airway well when given small boluses, controlled in size and administered by SLP. When he self-feeds, thin and nectar thick liquids spill more into the pyriform sinuses before the swallow and they end up spilling into the airway before he achieves full epiglottic inversion. Aspiration is sensed in larger volumes (PAS 7, not able to eject aspirates despite strong sounding cough) but was silent x1 (PAS 8) with smaller voume. He contains boluses more consistently above his valleculae before the swallow with honey thick liquids and purees, and as a result, there is no aspiration observed. Recommend starting with Dys 1 (puree) diet and honey thick liquids but would need full supervision to assist with small bolus sizes, getting one bite or sip at a time. Would monitor respiratory status closely and even consider cortrak for consistency of nutrition/medications.  Factors that may increase risk of adverse event in presence of  aspiration Noe & Lianne 2021): Respiratory or GI disease;Reduced cognitive function;Limited mobility;Frail or deconditioned;Dependence for feeding and/or oral hygiene;Reduced saliva;Weak cough;Presence of tubes (ETT, trach, NG, etc.);Aspiration of thick, dense, and/or acidic materials  Swallow Evaluation Recommendations Recommendations: PO diet PO Diet Recommendation: Dysphagia 1 (Pureed);Moderately thick liquids (Level 3, honey thick) Liquid Administration via: Cup;Spoon Medication Administration: Crushed with puree Supervision: Staff to assist with self-feeding;Full supervision/cueing for swallowing strategies Swallowing strategies  : Minimize environmental distractions;Slow rate;Small bites/sips (one small bite or sip at a time, monitor respiratory status closely) Postural changes: Position pt fully upright for meals;Stay upright 30-60 min after meals Oral care recommendations: Oral care QID (4x/day) Caregiver Recommendations: Avoid jello, ice cream, thin soups, popsicles;Remove water  pitcher;Have oral suction available      Leita SAILOR., M.A. CCC-SLP Acute Rehabilitation Services Office [phone]  Secure chat preferred  12/04/2023,10:42 AM

## 2023-12-04 NOTE — Progress Notes (Signed)
 NAME:  Ian Wong, MRN:  [id number], DOB:  1961-02-21, LOS: 4 ADMISSION DATE:  11/30/2023, CONSULTATION DATE:  11/30/23 REFERRING MD:  everitt Sarks Rodgriguez CHIEF COMPLAINT:  L weakness   History of Present Illness:  Pt is encephelopathic; therefore, this HPI is obtained from chart review.Ian Wong is a 63 y.o. male who has a PMH as below and who presented to [facility] 12/30 with R gaze deviation and L hemiplegia. He was last known well around 12:45PM that day. CTH demonstrated ASPECTS 10 with no large territory infarct or ICH. CTA demonstrated occlusion of R ICA with distal intracranial reconstitution and occlusion of R MCA. He was given TNKase  and transferred to [facility] for neuro IR intervention.  Upon arrival to [facility], he was taken to IR where he had diagnostic cerebral angiogram with mechanical thrombectomy, right carotid stenting and angioplasty with cerebral protection device.  While getting him intubated for IR procedure, he had a vomiting episode and there was concern for aspiration; therefore, post procedure, anesthesia opted to leave him intubated. PCCM was subsequently called for assistance with ventilator management.  Pertinent  Medical History:  has Cellulitis; Cellulitis of left lower extremity; PAD (peripheral artery disease) (HCC); Critical lower limb ischemia (HCC); Leriche syndrome (HCC); Aortoiliac occlusive disease (HCC); Incisional hernia, without obstruction or gangrene; Acute ischemic right MCA stroke (HCC); Acute right MCA stroke (HCC); Acute hypoxic respiratory failure (HCC); Acute metabolic encephalopathy; and Aspiration pneumonia (HCC) on their problem list.  Significant Hospital Events: Including procedures, antibiotic start and stop dates in addition to other pertinent events   12/30 to [facility] for diagnostic cerebral angiogram with mechanical thrombectomy, right carotid stenting and angioplasty with cerebral protection device, remained intubated post procedure. Head CT  12/30 (post procedure) > no abnormality, chronic small vessel disease.  Exam quality poor due to streak artifact MRI brain 12/31 > fairly extensive acute right MCA territory infarct, few small foci in the right basal ganglia that could reflect chronic microhemorrhage or acute petechial hemorrhage.  Few nonspecific punctate chronic microhemorrhages scattered elsewhere in the supratentorial brain.  Chronic left thalamic lacunar infarct 12/31 echocardiogram > LVEF 50-55%, normal diastolic function, normal RV size and function 12/31: Extubated 1/1 head CT >> evolving large right MCA territory infarct with increased mass effect on the right lateral ventricle, no acute hemorrhage 1/1: 3% saline initiated  Interim History / Subjective:  Patient continued to have expiratory wheezes Denies shortness of breath Continues to have left upper extremity weakness  Objective:  Blood pressure 124/65, pulse 65, temperature 99.2 F (37.3 C), temperature source Axillary, resp. rate 14, height 5' 11 (1.803 m), weight 96.8 kg, SpO2 98%.        Intake/Output Summary (Last 24 hours) at 12/04/2023 9072 Last data filed at 12/04/2023 0800 Gross per 24 hour  Intake 349.48 ml  Output 1620 ml  Net -1270.52 ml   Filed Weights   11/30/23 1442 11/30/23 1445  Weight: 99.5 kg 96.8 kg    Examination: General: Middle-age male male, lying on the bed HEENT: [place]/AT, eyes anicteric.  moist mucus membranes Neuro: Alert, awake following commands, left upper extremity is plegic. Chest: Bilateral expiratory wheezes right more than left, no crackles Heart: Regular rate and rhythm, no murmurs or gallops Abdomen: Soft, nontender, nondistended, bowel sounds present Skin: No rash  Labs and images reviewed  Labs/imaging personally reviewed:    Assessment & Plan:  Acute right MCA ischemic stroke R ICA and R MCA occlusion - s/p TNKase  and mechanical thrombectomy  with right carotid stenting and angioplasty Stroke team is  following Continue neuro watch Continue secondary stroke prophylaxis Continue aspirin  and Brilinta  Continue statin Failed swallow evaluation Will place cortrak today PT/OT evaluation  Acute respiratory insufficiency, resolved Aspiration pneumonia Probable acute COPD exacerbation Patient was successfully extubated postprocedure He vomited during intubation, likely have aspirated Continue Unasyn  to complete 5 days therapy He does have expiratory wheezes He quit smoking 7 years ago Will start prednisone  40 mg daily for 5 days Continue scheduled DuoNeb  Hyperlipidemia Continue atorvastatin   Hypothyroidism. Continue levothyroxine   Urinary retention Foley removed on 1/1, no urine output and has required I/O cath x 3 Continue doxazosin  per tube, retention protocol.  Voiding trial in 2 days  Best practice (evaluated daily):  Diet/type: NPO start tube feed DVT prophylaxis: SCD Pressure ulcer(s): pressure ulcer assessment deferred  GI prophylaxis: PPI Lines: N/A Foley:  N/A Code Status:  full code Last date of multidisciplinary goals of care discussion: 1/3: Patient was updated at bedside, decision was to continue full scope of care  Labs   CBC: Recent Labs  Lab 11/30/23 1424 11/30/23 1430 12/01/23 0646 12/02/23 0645 12/02/23 1512 12/03/23 0637  WBC 8.0  --  12.7* 13.0*  --  7.4  NEUTROABS 4.5  --   --   --   --   --   HGB 16.4 15.0 14.6 13.6 11.6* 13.2  HCT 46.8 44.0 42.7 38.8* 34.0* 38.2*  MCV 86.8  --  89.1 87.6  --  88.6  PLT 222  --  199 173  --  146*    Basic Metabolic Panel: Recent Labs  Lab 11/30/23 1424 11/30/23 1430 12/01/23 0646 12/02/23 0645 12/02/23 1512 12/02/23 1619 12/02/23 2246 12/03/23 0637  NA 135 139 139 137 137 140 141 145  K 3.7 3.8 4.2 3.7 3.6  --   --  3.7  CL 105 103 108 109  --   --   --  119*  CO2 24  --  22 21*  --   --   --  18*  GLUCOSE 108* 106* 99 106*  --   --   --  112*  BUN 14 14 10 10   --   --   --  14  CREATININE  1.08 1.20 1.04 0.93  --   --   --  0.91  CALCIUM  9.0  --  8.1* 8.0*  --   --   --  8.0*  MG  --   --  1.8 2.0  --   --   --   --   PHOS  --   --  3.1  --   --   --   --   --    GFR: Estimated Creatinine Clearance: 101.2 mL/min (by C-G formula based on SCr of 0.91 mg/dL). Recent Labs  Lab 11/30/23 1424 11/30/23 2018 12/01/23 0646 12/02/23 0645 12/03/23 0637  PROCALCITON  --  <0.10 <0.10  --   --   WBC 8.0  --  12.7* 13.0* 7.4    Liver Function Tests: Recent Labs  Lab 11/30/23 1424  AST 24  ALT 23  ALKPHOS 59  BILITOT 0.8  PROT 7.4  ALBUMIN  3.6   No results for input(s): LIPASE, AMYLASE in the last 168 hours. No results for input(s): AMMONIA in the last 168 hours.  ABG    Component Value Date/Time   PHART 7.390 12/02/2023 1512   PCO2ART 35.6 12/02/2023 1512   PO2ART 98 12/02/2023 1512  HCO3 21.4 12/02/2023 1512   TCO2 22 12/02/2023 1512   ACIDBASEDEF 3.0 (H) 12/02/2023 1512   O2SAT 97 12/02/2023 1512     Coagulation Profile: Recent Labs  Lab 11/30/23 1424  INR 1.1    Cardiac Enzymes: No results for input(s): CKTOTAL, CKMB, CKMBINDEX, TROPONINI in the last 168 hours.  HbA1C: Hgb A1c MFr Bld  Date/Time Value Ref Range Status  11/30/2023 08:18 PM 5.9 (H) 4.8 - 5.6 % Final    Comment:    (NOTE)         Prediabetes: 5.7 - 6.4         Diabetes: >6.4         Glycemic control for adults with diabetes: <7.0   01/03/2017 11:51 AM 5.4 4.8 - 5.6 % Final    Comment:    (NOTE)         Pre-diabetes: 5.7 - 6.4         Diabetes: >6.4         Glycemic control for adults with diabetes: <7.0     CBG: Recent Labs  Lab 11/30/23 1420  GLUCAP 116*      Valinda Novas, MD [facility] See Amion for pager If no response to pager, please call [phone] until 7pm After 7pm, Please call E-link [phone]

## 2023-12-04 NOTE — Procedures (Signed)
 Cortrak  Person Inserting Tube:  Rosabel Rollo PARAS, RD Tube Type:  Cortrak - 43 inches Tube Size:  10 Tube Location:  Right nare Secured by: Bridle Technique Used to Measure Tube Placement:  Marking at nare/corner of mouth Cortrak Secured At:  71 cm   Cortrak Tube Team Note:  Consult received to place a Cortrak feeding tube.   X-ray is required, abdominal x-ray has been ordered by the Cortrak team. Please confirm tube placement before using the Cortrak tube.   If the tube becomes dislodged please keep the tube and contact the Cortrak team at [email or web address] for replacement.  If after hours and replacement cannot be delayed, place a NG tube and confirm placement with an abdominal x-ray.    Mallie Rosabel, MS, RD, LDN Registered Dietitian II Please see AMiON for contact information.

## 2023-12-04 NOTE — Progress Notes (Signed)
 Physical Therapy Treatment Patient Details Name: Ian Wong MRN: [id number] DOB: [birth year] Today's Date: 12/04/2023   History of Present Illness 63 yo male transfer from [facility] with L side weakness and R gaze deviation CT (+) R MCA 12/31 thrombectomy  extubated 12/31 PMH GERD hypothyroidism    PT Comments  Pt starting to show good progress toward goals.  Balance and sit to stands, standing balance and pre-gait task have improved notably.  Emphasis on transition, sitting balance, sit to stand trials with pregait including w/shifting, unloading stepping, then transfers with pivotal steps and moderate support +2 to the chair.    If plan is discharge home, recommend the following: A lot of help with bathing/dressing/bathroom;Assistance with cooking/housework;Assist for transportation;Help with stairs or ramp for entrance;Two people to help with walking and/or transfers;Direct supervision/assist for medications management;Direct supervision/assist for financial management   Can travel by private vehicle        Equipment Recommendations  Other (comment)    Recommendations for Other Services Rehab consult     Precautions / Restrictions Precautions Precautions: Fall Precaution Comments: L inattention with some improvement     Mobility  Bed Mobility Overal bed mobility: Needs Assistance Bed Mobility: Supine to Sit     Supine to sit: Min assist, HOB elevated     General bed mobility comments: requires (A) to transfer to the L side, pt needs help to elevate trunk from surface    Transfers Overall transfer level: Needs assistance Equipment used: 1 person hand held assist, 2 person hand held assist Transfers: Sit to/from Stand, Bed to chair/wheelchair/BSC Sit to Stand: Min assist, +2 physical assistance Stand pivot transfers: Mod assist, +2 physical assistance Step pivot transfers: Mod assist, +2 physical assistance       General transfer comment: With assist, pt able to  weakly w/shift R and step left several times to pivot over to the bed.  2 person moderat assist.    Ambulation/Gait Ambulation/Gait assistance: Mod assist Gait Distance (Feet): 3 Feet Assistive device: 2 person hand held assist             Stairs             Wheelchair Mobility     Tilt Bed    Modified Rankin (Stroke Patients Only) Modified Rankin (Stroke Patients Only) Pre-Morbid Rankin Score: No symptoms Modified Rankin: Severe disability     Balance Overall balance assessment: Needs assistance Sitting-balance support: Single extremity supported, Feet supported Sitting balance-Leahy Scale: Poor Sitting balance - Comments: pt's balance is still poor, but much improved over his pushing heavily to the L on inital evaluation.  Pt able to find and hold midline for extend periods of time with UE's, but without assist,  cuing only.   Standing balance support: Single extremity supported, During functional activity Standing balance-Leahy Scale: Poor Standing balance comment: L lean, modA for standing balance with R UE support                            Cognition Arousal: Alert Behavior During Therapy: Flat affect Overall Cognitive Status: Impaired/Different from baseline (NT formally)                     Current Attention Level: Sustained   Following Commands: Follows one step commands with increased time   Awareness: Intellectual Problem Solving: Slow processing, Decreased initiation, Difficulty sequencing, Requires verbal cues, Requires tactile cues General Comments: pt with increased response time  compared to evaluation of patient. pt with visual attention the entire session. pt following verbal cues for visual attention in L field with only auditory cue        Exercises Other Exercises Other Exercises: bil hip/knee flex/ext with graded resistance x 10 reps.    General Comments General comments (skin integrity, edema, etc.): VSS, pt  wheezy breath sounds during session but VSS. pt coughing without production a few times during session. pt with poor oral control of secretions and wiping mouth with uces. RA during session      Pertinent Vitals/Pain Pain Assessment Pain Assessment: Faces Faces Pain Scale: No hurt Pain Intervention(s): Monitored during session    Home Living                          Prior Function            PT Goals (current goals can now be found in the care plan section) Acute Rehab PT Goals Patient Stated Goal: to get back home PT Goal Formulation: With patient/family Time For Goal Achievement: 12/16/23 Potential to Achieve Goals: Good Progress towards PT goals: Progressing toward goals    Frequency    Min 4X/week      PT Plan      Co-evaluation PT/OT/SLP Co-Evaluation/Treatment: Yes Reason for Co-Treatment: For patient/therapist safety;To address functional/ADL transfers PT goals addressed during session: Mobility/safety with mobility OT goals addressed during session: Proper use of Adaptive equipment and DME;ADL's and self-care;Strengthening/ROM      AM-PAC PT 6 Clicks Mobility   Outcome Measure  Help needed turning from your back to your side while in a flat bed without using bedrails?: A Lot Help needed moving from lying on your back to sitting on the side of a flat bed without using bedrails?: A Lot Help needed moving to and from a bed to a chair (including a wheelchair)?: A Lot Help needed standing up from a chair using your arms (e.g., wheelchair or bedside chair)?: A Lot Help needed to walk in hospital room?: Total Help needed climbing 3-5 steps with a railing? : Total 6 Click Score: 10    End of Session Equipment Utilized During Treatment: Gait belt Activity Tolerance: Patient tolerated treatment well;Patient limited by fatigue Patient left: in chair;with call bell/phone within reach;with chair alarm set;with family/visitor present Nurse Communication:  Mobility status;Need for lift equipment;Other (comment) PT Visit Diagnosis: Other abnormalities of gait and mobility (R26.89);Other symptoms and signs involving the nervous system (R29.898) Hemiplegia - Right/Left: Left Hemiplegia - dominant/non-dominant: Non-dominant Hemiplegia - caused by: Other cerebrovascular disease     Time: 1208-1241 PT Time Calculation (min) (ACUTE ONLY): 33 min  Charges:    $Neuromuscular Re-education: 8-22 mins PT General Charges $$ ACUTE PT VISIT: 1 Visit                     12/04/2023  India HERO., PT Acute Rehabilitation Services [phone]  (office)   Vinie GAILS Lamari Youngers 12/04/2023, 7:47 PM

## 2023-12-04 NOTE — Progress Notes (Addendum)
 STROKE TEAM PROGRESS NOTE   [name] is a 63 y.o. male with past medical history significant for seizures, hypothyroidism, GERD who presented to [name] ED 12/30 with persistent right gaze deviation and left hemiplegia.  His last known well was 1245 family did call EMS when his symptoms started.  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  Patient was given TNK at [facility] and was transferred to [facility] for emergent thrombectomy. On neurologic neurology exam on arrival to [facility] patient still had persistent left-sided weakness, right gaze, left-sided sensory deficit, left hemianopsia, left sensory neglect. He was then taken emergently to IR.  NIH 18 on admit  SIGNIFICANT HOSPITAL EVENTS 12/30: Transferred from [name] to [facility] for emergent thrombectomy             Mechanical thrombectomy, right carotid stent and angioplasty  --> TICI 3             Left intubated postprocedure             Post procedure CT showed no hemorrhage. 12/31: MRI shows extensive acute right MCA territory infarcts with chronic microhemorrhages versus acute petechial hemorrhages.  1/1: Remains in ICU. Ongoing Neo for BP support. SLP NPO except for meds. 1/2: Hypertonic saline discontinued  Interval history RN at bedside. CCM at bedside.   Patient seems more alert and responsive today. Off pressors. Received racemic epi yesterday. Currently still has wheezing, but no respiratory distress or labored breathing noted.   SLP recommending Coretrak and dysphagia level 1 diet and level 3 liquids post-Barium swallow exam this AM.   Discharge plan for CIR. Will transfer out of ICU today.    Vitals:   12/04/23 0900 12/04/23 1000 12/04/23 1100 12/04/23 1200  BP: (!) 135/56 137/65 125/62   Pulse: 65 66 (!) 56   Resp: 18 (!) 22 16   Temp:    99.8 F (37.7 C)  TempSrc:    Oral  SpO2: 98% 98% 97%   Weight:      Height:       CBC:  Recent  Labs  Lab 11/30/23 1424 11/30/23 1430 12/03/23 0637 12/04/23 1057  WBC 8.0   < > 7.4 7.0  NEUTROABS 4.5  --   --   --   HGB 16.4   < > 13.2 13.7  HCT 46.8   < > 38.2* 38.5*  MCV 86.8   < > 88.6 86.3  PLT 222   < > 146* 172   < > = values in this interval not displayed.   Basic Metabolic Panel:  Recent Labs  Lab 12/01/23 0646 12/02/23 0645 12/02/23 1512 12/03/23 0637 12/04/23 0856  NA 139 137   < > 145 142  K 4.2 3.7   < > 3.7 4.0  CL 108 109  --  119* 115*  CO2 22 21*  --  18* 19*  GLUCOSE 99 106*  --  112* 84  BUN 10 10  --  14 16  CREATININE 1.04 0.93  --  0.91 0.93  CALCIUM  8.1* 8.0*  --  8.0* 8.3*  MG 1.8 2.0  --   --  2.2  PHOS 3.1  --   --   --   --    < > = values in this interval not displayed.   Lipid Panel:  Recent Labs  Lab 12/01/23 0647  CHOL 112  TRIG 146  HDL 26*  CHOLHDL 4.3  VLDL 29  LDLCALC 57   HgbA1c:  Recent Labs  Lab 11/30/23 2018  HGBA1C 5.9*   Urine Drug Screen: No results for input(s): LABOPIA, COCAINSCRNUR, LABBENZ, AMPHETMU, THCU, LABBARB in the last 168 hours.  Alcohol Level  Recent Labs  Lab 11/30/23 1424  ETH <10    IMAGING past 24 hours DG Swallowing Func-Speech Pathology Result Date: 12/04/2023 Table formatting from the original result was not included. Modified Barium Swallow Study Patient Details Name: Ian Wong MRN: [id number] Date of Birth: Oct 14, 1961 Today's Date: 12/04/2023 HPI/PMH: HPI: 63 yo male transfer from [facility] with L side weakness and R gaze deviation s/p TNK and thrombectomy 12/30. There was some concern for aspiration after vomiting episode during intubation so was kept on vent until 12/31. 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 Repeate CT 1/1 due to decreased wakefulness showed increasing mass effect on the R lateral ventricle.  PMH includes: GERD, hypothyroidism, seizures Clinical Impression: Clinical Impression: Pt has a moderate oropharyngeal dysphagia including reduced  labial seal with anterior loss on the L side and slow posterior lingual transit. He has adequate oral and pharyngeal clearance. He has reduced laryngeal elevation and laryngeal vestibule closure but he can still protect his airway well when given small boluses, controlled in size and administered by SLP. When he self-feeds, thin and nectar thick liquids spill more into the pyriform sinuses before the swallow and they end up spilling into the airway before he achieves full epiglottic inversion. Aspiration is sensed in larger volumes (PAS 7, not able to eject aspirates despite strong sounding cough) but was silent x1 (PAS 8) with smaller voume. He contains boluses more consistently above his valleculae before the swallow with honey thick liquids and purees, and as a result, there is no aspiration observed. Recommend starting with Dys 1 (puree) diet and honey thick liquids but would need full supervision to assist with small bolus sizes, getting one bite or sip at a time. Would monitor respiratory status closely and even consider cortrak for consistency of nutrition/medications. Factors that may increase risk of adverse event in presence of aspiration Noe & Lianne 2021): Factors that may increase risk of adverse event in presence of aspiration Noe & Lianne 2021): Respiratory or GI disease; Reduced cognitive function; Limited mobility; Frail or deconditioned; Dependence for feeding and/or oral hygiene; Reduced saliva; Weak cough; Presence of tubes (ETT, trach, NG, etc.); Aspiration of thick, dense, and/or acidic materials Recommendations/Plan: Swallowing Evaluation Recommendations Swallowing Evaluation Recommendations Recommendations: PO diet PO Diet Recommendation: Dysphagia 1 (Pureed); Moderately thick liquids (Level 3, honey thick) Liquid Administration via: Cup; Spoon Medication Administration: Crushed with puree Supervision: Staff to assist with self-feeding; Full supervision/cueing for swallowing  strategies Swallowing strategies  : Minimize environmental distractions; Slow rate; Small bites/sips (one small bite or sip at a time, monitor respiratory status closely) Postural changes: Position pt fully upright for meals; Stay upright 30-60 min after meals Oral care recommendations: Oral care QID (4x/day) Caregiver Recommendations: Avoid jello, ice cream, thin soups, popsicles; Remove water  pitcher; Have oral suction available Treatment Plan Treatment Plan Treatment recommendations: Therapy as outlined in treatment plan below Follow-up recommendations: Acute inpatient rehab (3 hours/day) Functional status assessment: Patient has had a recent decline in their functional status and demonstrates the ability to make significant improvements in function in a reasonable and predictable amount of time. Treatment frequency: Min 2x/week Treatment duration: 2 weeks Interventions: Aspiration precaution training; Compensatory techniques; Patient/family education; Trials of upgraded texture/liquids;  Diet toleration management by SLP; Respiratory muscle strength training Recommendations Recommendations for follow up therapy are one component of a multi-disciplinary discharge planning process, led by the attending physician.  Recommendations may be updated based on patient status, additional functional criteria and insurance authorization. Assessment: Orofacial Exam: Orofacial Exam Oral Cavity - Dentition: Missing dentition Orofacial Anatomy: WFL Anatomy: Anatomy: Prominent cricopharyngeus Boluses Administered: Boluses Administered Boluses Administered: Thin liquids (Level 0); Mildly thick liquids (Level 2, nectar thick); Moderately thick liquids (Level 3, honey thick); Puree  Oral Impairment Domain: Oral Impairment Domain Lip Closure: Escape beyond mid-chin Tongue control during bolus hold: Posterior escape of greater than half of bolus Bolus preparation/mastication: -- (deferred) Bolus transport/lingual motion: Slow tongue  motion Oral residue: Complete oral clearance Location of oral residue : N/A Initiation of pharyngeal swallow : Pyriform sinuses  Pharyngeal Impairment Domain: Pharyngeal Impairment Domain Soft palate elevation: No bolus between soft palate (SP)/pharyngeal wall (PW) Laryngeal elevation: Partial superior movement of thyroid cartilage/partial approximation of arytenoids to epiglottic petiole Anterior hyoid excursion: Complete anterior movement Epiglottic movement: Complete inversion Laryngeal vestibule closure: Incomplete, narrow column air/contrast in laryngeal vestibule Pharyngeal stripping wave : Present - complete Pharyngeal contraction (A/P view only): N/A Pharyngoesophageal segment opening: Partial distention/partial duration, partial obstruction of flow Tongue base retraction: Trace column of contrast or air between tongue base and PPW Pharyngeal residue: Complete pharyngeal clearance Location of pharyngeal residue: N/A  Esophageal Impairment Domain: Esophageal Impairment Domain Esophageal clearance upright position: -- (unable to view due to body habitus) Pill: Pill Consistency administered: -- (deferred) Penetration/Aspiration Scale Score: Penetration/Aspiration Scale Score 1.  Material does not enter airway: Moderately thick liquids (Level 3, honey thick); Puree 7.  Material enters airway, passes BELOW cords and not ejected out despite cough attempt by patient: Mildly thick liquids (Level 2, nectar thick) 8.  Material enters airway, passes BELOW cords without attempt by patient to eject out (silent aspiration) : Thin liquids (Level 0) Compensatory Strategies: Compensatory Strategies Compensatory strategies: No   General Information: Caregiver present: Yes (RN)  Diet Prior to this Study: NPO   Temperature : Normal   Respiratory Status: Increased WOB; Other (comment) (wheezing)   Supplemental O2: Nasal cannula   History of Recent Intubation: Yes  Behavior/Cognition: Alert; Cooperative; Requires cueing  Self-Feeding Abilities: Able to self-feed Baseline vocal quality/speech: Hypophonia/low volume; Dysphonic Volitional Cough: Able to elicit Volitional Swallow: Able to elicit Exam Limitations: No limitations Goal Planning: Prognosis for improved oropharyngeal function: Good Barriers to Reach Goals: Cognitive deficits No data recorded Patient/Family Stated Goal: none stated Consulted and agree with results and recommendations: Patient; Physician; Advanced practice provider; Nurse Pain: Pain Assessment Pain Assessment: Faces Faces Pain Scale: 0 Facial Expression: 0 Body Movements: 0 Muscle Tension: 0 Compliance with ventilator (intubated pts.): N/A Vocalization (extubated pts.): 0 CPOT Total: 0 Pain Location: generalized with mobility Pain Descriptors / Indicators: Discomfort; Grimacing Pain Intervention(s): Monitored during session; Limited activity within patient's tolerance; Repositioned End of Session: Start Time:SLP Start Time (ACUTE ONLY): 0944 Stop Time: SLP Stop Time (ACUTE ONLY): 1005 Time Calculation:SLP Time Calculation (min) (ACUTE ONLY): 21 min Charges: SLP Evaluations $ SLP Speech Visit: 1 Visit SLP Evaluations $MBS Swallow: 1 Procedure $Swallowing Treatment: 1 Procedure SLP visit diagnosis: SLP Visit Diagnosis: Dysphagia, oropharyngeal phase (R13.12) Past Medical History: Past Medical History: Diagnosis Date  GERD (gastroesophageal reflux disease)   Hypothyroidism   Seizures (HCC)  Past Surgical History: Past Surgical History: Procedure Laterality Date  AORTA - BILATERAL FEMORAL ARTERY BYPASS GRAFT Bilateral 01/07/2017  Procedure:  AORTOBIFEMORAL BYPASS GRAFT;  Surgeon: Krystal JULIANNA Doing, MD;  Location: [facility] OR;  Service: Vascular;  Laterality: Bilateral;  I & D EXTREMITY Left 11/28/2014  Procedure: IRRIGATION AND DEBRIDEMENT;  REPAIR OF CRUSH INJURY;  Surgeon: Franky Curia, MD;  Location: [facility] OR;  Service: Orthopedics;  Laterality: Left;  INCISIONAL HERNIA REPAIR N/A 10/26/2017  Procedure: HERNIA REPAIR  INCISIONAL WITH MESH;  Surgeon: Mavis Anes, MD;  Location: [facility] ORS;  Service: General;  Laterality: N/A;  IR CT HEAD LTD  11/30/2023  IR INTRAVSC STENT CERV CAROTID W/EMB-PROT MOD SED INCL ANGIO  11/30/2023  IR PERCUTANEOUS ART THROMBECTOMY/INFUSION INTRACRANIAL INC DIAG ANGIO  11/30/2023  IR US  GUIDE VASC ACCESS RIGHT  11/30/2023  RADIOLOGY WITH ANESTHESIA N/A 11/30/2023  Procedure: IR WITH ANESTHESIA;  Surgeon: Radiologist, Medication, MD;  Location: [facility] OR;  Service: Radiology;  Laterality: N/A; Leita SAILOR., M.A. CCC-SLP Acute Rehabilitation Services Office [phone] Secure chat preferred 12/04/2023, 10:52 AM   PHYSICAL EXAM Temp:  [97.5 F (36.4 C)-99.8 F (37.7 C)] 99.8 F (37.7 C) (01/03 1200) Pulse Rate:  [53-88] 56 (01/03 1100) Resp:  [12-24] 16 (01/03 1100) BP: (112-152)/(52-100) 125/62 (01/03 1100) SpO2:  [93 %-99 %] 97 % (01/03 1100)  General - Well nourished, well developed middle-age Caucasian male, in no apparent distress. HEENT- atraumatic, normocephalic  Cardiovascular - Regular rhythm and rate. Pulmonary- equal chest rise; unlabored respirations. Bilateral rhonchi and continued wheezing.   Neuro: Mental Status: Patient is awake, alert, oriented to person, place, month, year, and situation.continues to follow all commands.  Patient is able to give a clear and coherent history with non-fluent speech and mild dysarthria.  No signs of aphasia or neglect Cranial Nerves: II: Visual Fields are full. Pupils are equal, round, and reactive to light.   III,IV, VI: right gaze preference but does cross midline and tracks examiner.  V: Facial sensation is symmetric to temperature VII: Mild left facial weakness.  VIII: hearing is intact to voice X: Uvula elevates symmetrically XI: Shoulder shrug is symmetric. XII: tongue is midline without atrophy or fasciculations.  Motor: Tone is normal. Bulk is normal.  RUE: 4/5, no drift LUE: Flaccid, no spontaneous movement. Currently in  soft brace BLE: No drift present antigravity strength.  Sensory: Sensation is symmetric to light touch in the arms and legs. Cerebellar: FNF intact on RUE, HKS are slow, but intact BLE. Gait:  Deferred   ASSESSMENT/PLAN Mr. Blong Busk is a 63 y.o. male with history of GERD, Hypothyroid, and seizures presenting with acute stroke.   Acute Ischemic Infarct:  right ICA and MCA distribution s/p mechanical thrombectomy and TNK with TICI 3 revascularization with use of rescue right carotid stent Etiology:  large vessel disease, with Right ICA and MCA occlusion  Code Stroke CT head  negative for a large hypodensity concerning for a large territory infarct or hyperdensity concerning for an ICH  CTA head & neck  R ICA occlusion with distal intracranial reconstitution before occlusion of the proximal right MCA M1 with poor opacification of the distal vessels.  Post IR CT: No hemorrhage MRI:  Fairly extensive acute right MCA territory infarcts Chronic microhemorrhages versus acute petechial hemorrhages seen along the right basal ganglia Chronic lacunar infarct within the left thalamus Carotid Doppler: PENDING 2D Echo: LVEF 50 to 55% LDL 57 HgbA1c 5.9% VTE prophylaxis - SCDs aspirin  81 mg daily prior to admission, now on aspirin  81 mg daily and Brilinta  (ticagrelor ) 90 mg bid  Therapy recommendations:  CIR for speech, CIR for  PT per PT recs. Skilled OT per OT recs. Disposition:  likely CIR    Essential Hypertension Hypotension, resolved Home meds:  none Unstable SBP goal above 110 Currently off Pressor support   Hyperlipidemia Home meds: Lipitor 20 mg, resumed in hospital LDL 57, goal < 70 Continue statin at discharge   Tobacco Abuse Former Cigarette Smoker   Dysphagia Patient has post-stroke dysphagia, SLP consulted MBS: Cortrak recommended, Dysphagia level 1 and level 3 liquids Pending Cortrak placement Recommended CIR for ongoing ST needs   Other Stroke Risk  Factors Obesity, Body mass index is 29.75 kg/m., BMI >/= 30 associated with increased stroke risk, recommend weight loss, diet and exercise as appropriate    Other Active Problems Hypothyroidism Hx of Seizures, no AED at home  Hospital day # 4  Pt seen by Neuro NP/APP and later by MD. Note/plan to be edited by MD as needed.    Rocky JAYSON Likes, DNP, AGACNP-BC [facility] Please use AMION for contact information & EPIC for messaging.  I have personally obtained history,examined this patient, reviewed notes, independently viewed imaging studies, participated in medical decision making and plan of care.ROS completed by me personally and pertinent positives fully documented  I have made any additions or clarifications directly to the above note. Agree with note above.  Patient neurological exam is stable with left hemiplegia, dysarthria and right gaze deviation.  He still has significant dysphagia.  Plan wean off vasopressor support.  Change NG tube to Pender tube for feeding.  Continue aspirin  and Brilinta .  Mobilize out of bed.  Physical Occupational Therapy and speech therapy consults ongoing.  Transfer out of ICU to regular floor bed later today.  Hopefully transfer to inpatient rehab after the weekend.  Discussed with Dr. Harold critical care medicine.  Greater than 50% time during this 50-minute visit was spent in counseling and coordination of care and discussion with patient and care team and answering questions. Eather Popp, MD Medical Director [facility] Pager: [phone] 12/04/2023 3:43 PM  To contact Stroke Continuity provider, please refer to [email or web address]. After hours, contact General Neurology

## 2023-12-04 NOTE — Progress Notes (Signed)
 Occupational Therapy Treatment Patient Details Name: Ian Wong MRN: [id number] DOB: [birth year] Today's Date: 12/04/2023   History of present illness 63 yo male transfer from [facility] with L side weakness and R gaze deviation CT (+) R MCA 12/31 thrombectomy  extubated 12/31 PMH GERD hypothyroidism   OT comments  Pt progressed this session with transfer and static sitting balance. Pt with decreased pushing and increased awareness of static sitting balance. Pt able to follow auditory cue to align head with occasional tactile input for neck extension. Pt with increased activity tolerance this session despite wheezy breath sounds. RA during session with O2 >90 %. Recommendation for Patient will benefit from intensive inpatient follow up therapy, >3 hours/day       If plan is discharge home, recommend the following:  Two people to help with walking and/or transfers;Two people to help with bathing/dressing/bathroom   Equipment Recommendations  BSC/3in1;Wheelchair (measurements OT);Wheelchair cushion (measurements OT);Hospital bed;Hoyer lift    Recommendations for Other Services Rehab consult    Precautions / Restrictions Precautions Precautions: Fall Precaution Comments: L inattention with some improvement       Mobility Bed Mobility Overal bed mobility: Needs Assistance Bed Mobility: Supine to Sit     Supine to sit: Min assist, HOB elevated          Transfers Overall transfer level: Needs assistance Equipment used: 1 person hand held assist, 2 person hand held assist Transfers: Sit to/from Stand, Bed to chair/wheelchair/BSC Sit to Stand: Min assist, +2 physical assistance Stand pivot transfers: Mod assist, +2 physical assistance   Step pivot transfers: Mod assist, +2 physical assistance     General transfer comment: With assist, pt able to weakly w/shift R and step left several times to pivot over to the bed.  2 person moderat assist.     Balance Overall balance  assessment: Needs assistance Sitting-balance support: Single extremity supported, Feet supported Sitting balance-Leahy Scale: Poor Sitting balance - Comments: pt's balance is still poor, but much improved over his pushing heavily to the L on inital evaluation.  Pt able to find and hold midline for extend periods of time with UE's, but without assist,  cuing only.   Standing balance support: Single extremity supported, During functional activity Standing balance-Leahy Scale: Poor Standing balance comment: L lean, modA for standing balance with R UE support                           ADL either performed or assessed with clinical judgement   ADL Overall ADL's : Needs assistance/impaired Eating/Feeding: NPO   Grooming: Minimal assistance;Sitting Grooming Details (indicate cue type and reason): decreased awareness of L side of mouth with drool                       Toileting - Clothing Manipulation Details (indicate cue type and reason): not aware of incontinence of bowel. pt with total (A) for hygiene. pt was able to sustain standing for the peri care            Extremity/Trunk Assessment Upper Extremity Assessment LUE Deficits / Details: L UE with activation of bicep and tricep, pt with abduction noted 2 times during session with cue to complete scapula elevation ( shrug)  no elbow or digit activation noted. pt with tone throughout arm with adduction and flexion synergy pattern LUE Sensation: decreased proprioception;decreased light touch LUE Coordination: decreased fine motor;decreased gross motor   Lower Extremity Assessment  Lower Extremity Assessment: Defer to PT evaluation        Vision       Perception Perception Perception: Impaired Preception Impairment Details: Inattention/Neglect   Praxis      Cognition Arousal: Alert Behavior During Therapy: Flat affect Overall Cognitive Status: Impaired/Different from baseline                      Current Attention Level: Sustained   Following Commands: Follows one step commands with increased time, Follows one step commands consistently   Awareness: Intellectual Problem Solving: Slow processing General Comments: pt with increased response time compared to evaluation of patient. pt with visual attention the entire session. pt following verbal cues for visual attention in L field with only auditory cue        Exercises Exercises: Other exercises Other Exercises Other Exercises: PROM shoulder flexion elbow extension shoulder extension and AAROM initiated, PROM elbow flexion and extension Other Exercises: ROM of digits Other Exercises: splint check with adjustment    Shoulder Instructions       General Comments VSS, pt wheezy breath sounds during session but VSS. pt coughing without production a few times during session. pt with poor oral control of secretions and wiping mouth with uces. RA during session    Pertinent Vitals/ Pain       Pain Assessment Pain Intervention(s): Monitored during session  Home Living                                          Prior Functioning/Environment              Frequency  Min 1X/week        Progress Toward Goals  OT Goals(current goals can now be found in the care plan section)  Progress towards OT goals: Progressing toward goals  Acute Rehab OT Goals Patient Stated Goal: none stated OT Goal Formulation: Patient unable to participate in goal setting Time For Goal Achievement: 12/16/23 Potential to Achieve Goals: Good ADL Goals Pt Will Perform Grooming: with set-up;sitting Pt Will Perform Upper Body Bathing: with mod assist;sitting Pt/caregiver will Perform Home Exercise Program: Increased strength;Increased ROM;Left upper extremity;With written HEP provided Additional ADL Goal #1: pt will static sit eob min guard (A) as precursor for transfers  Plan      Co-evaluation    PT/OT/SLP  Co-Evaluation/Treatment: Yes Reason for Co-Treatment: For patient/therapist safety;To address functional/ADL transfers   OT goals addressed during session: Proper use of Adaptive equipment and DME;ADL's and self-care;Strengthening/ROM      AM-PAC OT 6 Clicks Daily Activity     Outcome Measure   Help from another person eating meals?: A Little Help from another person taking care of personal grooming?: A Little Help from another person toileting, which includes using toliet, bedpan, or urinal?: A Lot Help from another person bathing (including washing, rinsing, drying)?: A Lot Help from another person to put on and taking off regular upper body clothing?: A Lot Help from another person to put on and taking off regular lower body clothing?: A Lot 6 Click Score: 14    End of Session Equipment Utilized During Treatment: Gait belt  OT Visit Diagnosis: Unsteadiness on feet (R26.81);Muscle weakness (generalized) (M62.81);Hemiplegia and hemiparesis Hemiplegia - Right/Left: Left Hemiplegia - dominant/non-dominant: Non-Dominant Hemiplegia - caused by: Cerebral infarction   Activity Tolerance Patient tolerated treatment well   Patient Left  in bed;with call bell/phone within reach;with family/visitor present (return to bed due to cortrak team present)   Nurse Communication Mobility status;Precautions        Time: 8797-8763 OT Time Calculation (min): 34 min  Charges: OT General Charges $OT Visit: 1 Visit OT Treatments $Self Care/Home Management : 8-22 mins   Brynn, OTR/L  Acute Rehabilitation Services Office: [phone] .   Ely Molt 12/04/2023, 3:20 PM

## 2023-12-04 NOTE — Progress Notes (Signed)
 Speech Language Pathology Treatment: Dysphagia  Patient Details Name: Ian Wong MRN: [id number] DOB: [birth year] Today's Date: 12/04/2023 Time: 8590-8580 SLP Time Calculation (min) (ACUTE ONLY): 10 min  Assessment / Plan / Recommendation Clinical Impression  SLP returned to room due to RN report of coughing with lunch tray. Pt had baseline wheezing but not coughing upon SLP entry. Lunch tray was resumed, and pt was noted to put more puree into his mouth before swallowing, despite verbal cues to slow rate. With SLP and RN intervening to provide increased feeding assistance, only one other cough was noted. Per RN, this appeared to be improved compared to initial attempt with tray. Will leave current diet in place, but note that pt is likely going to require heavy assistance with self-feeding in order to get one bite or sip at a time. If full supervision cannot be provided, would hold trays until it can be. Also note that pt coughed but did not clear his airway with most aspiration on MBS, so would also hold trays over the weekend if coughing increases during meal times.   HPI HPI: 63 yo male transfer from [facility] with L side weakness and R gaze deviation s/p TNK and thrombectomy 12/30. There was some concern for aspiration after vomiting episode during intubation so was kept on vent until 12/31. 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 Repeate CT 1/1 due to decreased wakefulness showed increasing mass effect on the R lateral ventricle.  PMH includes: GERD, hypothyroidism, seizures      SLP Plan  Continue with current plan of care      Recommendations for follow up therapy are one component of a multi-disciplinary discharge planning process, led by the attending physician.  Recommendations may be updated based on patient status, additional functional criteria and insurance authorization.    Recommendations  Diet recommendations: Dysphagia 1 (puree);Honey-thick liquid Liquids  provided via: Cup;Teaspoon Medication Administration: Crushed with puree Supervision: Staff to assist with self feeding;Full supervision/cueing for compensatory strategies Compensations: Minimize environmental distractions;Slow rate;Small sips/bites;Other (Comment) (one bite or sip at a time) Postural Changes and/or Swallow Maneuvers: Seated upright 90 degrees                  Oral care QID   Frequent or constant Supervision/Assistance Dysphagia, oropharyngeal phase (R13.12)     Continue with current plan of care     Leita SAILOR., M.A. CCC-SLP Acute Rehabilitation Services Office [phone]  Secure chat preferred   12/04/2023, 3:09 PM

## 2023-12-04 NOTE — Progress Notes (Signed)
 Inpatient Rehab Admissions Coordinator:   I met with Pt. To discuss potential CIR admit. He is interested and states that his wife is home 24/7. Confirms he is uninsured. I will follow for potential admit once medically stable.   Leita Kleine, MS, CCC-SLP Rehab Admissions Coordinator  [phone] (celll) [phone] (office)

## 2023-12-04 NOTE — Progress Notes (Addendum)
 Initial Nutrition Assessment  DOCUMENTATION CODES:   Not applicable  INTERVENTION:   Initiate tube feeding via  Cortrak when placed and confirmed: Osmolite 1.5  at 60 ml/h (1440 ml per day) Prosource TF20 60 ml BID 250 ml FWF Q6H  Provides 2320 kcal, 130 gm protein, 1097 ml free water  daily (2097 ml with FWF)-Rest of fluid needs by PO   - Magic cup TID with meals, each supplement provides 290 kcal and 9 grams of protein - Change to room service with assist to promote PO intake with assistance   NUTRITION DIAGNOSIS:   Predicted suboptimal nutrient intake related to acute illness as evidenced by other (comment) (dysphgia 1 diet with honecy thick lqiuids).   GOAL:   Patient will meet greater than or equal to 90% of their needs   MONITOR:   PO intake, TF tolerance, Supplement acceptance, Diet advancement, Labs  REASON FOR ASSESSMENT:   Consult Enteral/tube feeding initiation and management  ASSESSMENT:   Pt with hx of GERD and seizure disorder presented to ED with R gaze deviation and difficulty speaking. Imaging in ED suggestive of ischemic right MCA stroke.  12/30 - Intubated, TNKase  and mechanical thrombectomy with right carotid stenting and angioplasty, NPO  12/31 - Extubated, NPO 1/1 - showed increasing mass effect on the R lateral ventricle.  1/3 - dysphagia 1 diet, honey thick liquids   Pt resting in bed with sister at bedside. Pt's speech mumbled and was wheezing during visit. Family reports he was doing fine before admission. Pt states he was eating 3 meals a day and had an appetite. Breakfast would usually be cereal, Lunch a sandwich, and dinner would be whatever his wife makes. With sweets for snacks. He denies any weight loss and says his weight is around where he is now, 214 lbs.   Pt has been NPO for 4 days, just advanced by SLP to DYS 1 with honey thick liquids after barium swallow study. Took pt's lunch order, and discussed what is allowed on his current  diet. Pt was willing to try Magic cups. Cortrak to be placed today, unsure wether pt will eat anything by PO, will start TF at 100% of needs and wean as PO increases.  Pt is at mild risk of refeeding due to NPO for 4 days.   Discharge plan for CIR, CIR following. Per MD transferring out of ICU today.  Admit weight:  99.8 kg  Current weight: 96.8 kg    Intake/Output Summary (Last 24 hours) at 12/04/2023 1437 Last data filed at 12/04/2023 1300 Gross per 24 hour  Intake 449.48 ml  Output 1370 ml  Net -920.52 ml   Net IO Since Admission: 1,003.8 mL [12/04/23 1437]  Average Meal Intake: Just advanced to DYS 1  Nutritionally Relevant Medications: Scheduled Meds:  aspirin   81 mg Oral Daily   atorvastatin   20 mg Oral QPM   Chlorhexidine  Gluconate Cloth  6 each Topical Daily   docusate  100 mg Oral Daily   doxazosin   2 mg Oral Daily   enoxaparin  (LOVENOX ) injection  40 mg Subcutaneous Q24H   feeding supplement (PROSource TF20)  60 mL Per Tube BID   free water   250 mL Per Tube Q6H   ipratropium-albuterol   3 mL Nebulization Q6H   levothyroxine   50 mcg Oral Daily   polyethylene glycol  17 g Oral Daily   predniSONE   40 mg Oral Q breakfast   ticagrelor   90 mg Oral BID   Continuous Infusions:  ampicillin -sulbactam (  UNASYN ) IV 3 g (12/04/23 1207)   feeding supplement (OSMOLITE 1.5 CAL)     Labs Reviewed: No pertinent labs  HgbA1c 5.0 (11/2023)  NUTRITION - FOCUSED PHYSICAL EXAM:  Flowsheet Row Most Recent Value  Orbital Region No depletion  Upper Arm Region No depletion  Thoracic and Lumbar Region No depletion  Buccal Region No depletion  Temple Region No depletion  Clavicle Bone Region No depletion  Clavicle and Acromion Bone Region No depletion  Scapular Bone Region No depletion  Dorsal Hand No depletion  Patellar Region No depletion  Anterior Thigh Region No depletion  Posterior Calf Region No depletion  Edema (RD Assessment) None  Hair Reviewed  Eyes Reviewed  Mouth  Reviewed  Skin Reviewed  Nails Reviewed       Diet Order:   Diet Order             DIET - DYS 1 Room service appropriate? Yes with Assist; Fluid consistency: Honey Thick  Diet effective now                   EDUCATION NEEDS:   Education needs have been addressed  Skin:  Skin Assessment: Skin Integrity Issues: Skin Integrity Issues:: Incisions Incisions: R thigh  Last BM:  PTA  Height:   Ht Readings from Last 1 Encounters:  11/30/23 5' 11 (1.803 m)    Weight:   Wt Readings from Last 1 Encounters:  11/30/23 96.8 kg    Ideal Body Weight:  78.18 kg  BMI:  Body mass index is 29.75 kg/m.  Estimated Nutritional Needs:   Kcal:  2200-2400 kcal  Protein:  115-135 gm  Fluid:  1 ml/kcal  Olivia Kenning, RD Registered Dietitian  See Amion for more information

## 2023-12-05 LAB — BASIC METABOLIC PANEL
Anion gap: 7 (ref 5–15)
BUN: 21 mg/dL (ref 8–23)
CO2: 20 mmol/L — ABNORMAL LOW (ref 22–32)
Calcium: 8.2 mg/dL — ABNORMAL LOW (ref 8.9–10.3)
Chloride: 113 mmol/L — ABNORMAL HIGH (ref 98–111)
Creatinine, Ser: 0.84 mg/dL (ref 0.61–1.24)
GFR, Estimated: >60 mL/min (ref >60)
Glucose, Bld: 132 mg/dL — ABNORMAL HIGH (ref 70–99)
Potassium: 3.5 mmol/L (ref 3.5–5.1)
Sodium: 140 mmol/L (ref 135–145)

## 2023-12-05 LAB — CBC
HCT: 36 % — ABNORMAL LOW (ref 39.0–52.0)
Hemoglobin: 13.2 g/dL (ref 13.0–17.0)
MCH: 31.1 pg (ref 26.0–34.0)
MCHC: 36.7 g/dL — ABNORMAL HIGH (ref 30.0–36.0)
MCV: 84.7 fL (ref 80.0–100.0)
Platelets: 179 10*3/uL (ref 150–400)
RBC: 4.25 MIL/uL (ref 4.22–5.81)
RDW: 12.7 % (ref 11.5–15.5)
WBC: 7.1 10*3/uL (ref 4.0–10.5)
nRBC: 0 % (ref 0.0–0.2)

## 2023-12-05 LAB — RAPID URINE DRUG SCREEN, HOSP PERFORMED
Amphetamines: NOT DETECTED
Barbiturates: NOT DETECTED
Benzodiazepines: NOT DETECTED
Cocaine: NOT DETECTED
Opiates: NOT DETECTED
Tetrahydrocannabinol: NOT DETECTED

## 2023-12-05 MED ORDER — POTASSIUM CHLORIDE CRYS ER 20 MEQ PO TBCR: 40.0000 meq | EXTENDED_RELEASE_TABLET | Freq: Once | ORAL | Status: DC

## 2023-12-05 MED ORDER — ORAL CARE MOUTH RINSE
15.0000 mL | OROMUCOSAL | Status: DC | PRN
Start: 2023-12-05 — End: 2023-12-11

## 2023-12-05 MED ORDER — ORAL CARE MOUTH RINSE
15.0000 mL | OROMUCOSAL | Status: DC
  Administered 2023-12-05 – 2023-12-11 (×24): 15 mL via OROMUCOSAL

## 2023-12-05 MED ORDER — POTASSIUM CHLORIDE 20 MEQ PO PACK
40.0000 meq | PACK | Freq: Once | ORAL | Status: AC
  Administered 2023-12-05: 40 meq
  Filled 2023-12-05: qty 2

## 2023-12-05 MED ORDER — CALCIUM CARBONATE ANTACID 500 MG PO CHEW
1.0000 | CHEWABLE_TABLET | Freq: Two times a day (BID) | ORAL | Status: DC | PRN
  Administered 2023-12-06 (×3): 200 mg
  Filled 2023-12-05 (×4): qty 1

## 2023-12-05 NOTE — Progress Notes (Addendum)
 NAME:  Ian Wong, MRN:  [id number], DOB:  11/01/1961, LOS: 5 ADMISSION DATE:  11/30/2023, CONSULTATION DATE:  11/30/23 REFERRING MD:  everitt Sarks Rodgriguez CHIEF COMPLAINT:  L weakness   History of Present Illness:  Pt is encephelopathic; therefore, this HPI is obtained from chart review.Ian Wong is a 63 y.o. male who has a PMH as below and who presented to [facility] 12/30 with R gaze deviation and L hemiplegia. He was last known well around 12:45PM that day. CTH demonstrated ASPECTS 10 with no large territory infarct or ICH. CTA demonstrated occlusion of R ICA with distal intracranial reconstitution and occlusion of R MCA. He was given TNKase  and transferred to [facility] for neuro IR intervention.  Upon arrival to [facility], he was taken to IR where he had diagnostic cerebral angiogram with mechanical thrombectomy, right carotid stenting and angioplasty with cerebral protection device.  While getting him intubated for IR procedure, he had a vomiting episode and there was concern for aspiration; therefore, post procedure, anesthesia opted to leave him intubated. PCCM was subsequently called for assistance with ventilator management.  Pertinent  Medical History:  has Cellulitis; Cellulitis of left lower extremity; PAD (peripheral artery disease) (HCC); Critical lower limb ischemia (HCC); Leriche syndrome (HCC); Aortoiliac occlusive disease (HCC); Incisional hernia, without obstruction or gangrene; Acute ischemic right MCA stroke (HCC); Acute right MCA stroke (HCC); Acute hypoxic respiratory failure (HCC); Acute metabolic encephalopathy; and Aspiration pneumonia (HCC) on their problem list.  Significant Hospital Events: Including procedures, antibiotic start and stop dates in addition to other pertinent events   12/30 to [facility] for diagnostic cerebral angiogram with mechanical thrombectomy, right carotid stenting and angioplasty with cerebral protection device, remained intubated post procedure. Head CT  12/30 (post procedure) > no abnormality, chronic small vessel disease.  Exam quality poor due to streak artifact MRI brain 12/31 > fairly extensive acute right MCA territory infarct, few small foci in the right basal ganglia that could reflect chronic microhemorrhage or acute petechial hemorrhage.  Few nonspecific punctate chronic microhemorrhages scattered elsewhere in the supratentorial brain.  Chronic left thalamic lacunar infarct 12/31 echocardiogram > LVEF 50-55%, normal diastolic function, normal RV size and function 12/31: Extubated 1/1 head CT >> evolving large right MCA territory infarct with increased mass effect on the right lateral ventricle, no acute hemorrhage 1/1: 3% saline initiated  Interim History / Subjective:  Patient stated breathing is better Expiratory wheezes have improved Continues to have left upper extremity weakness Remain afebrile  Objective:  Blood pressure (!) 105/58, pulse 78, temperature 99.1 F (37.3 C), temperature source Oral, resp. rate 20, height 5' 11 (1.803 m), weight 96.8 kg, SpO2 99%.    FiO2 (%):  [28 %] 28 %   Intake/Output Summary (Last 24 hours) at 12/05/2023 0829 Last data filed at 12/05/2023 0800 Gross per 24 hour  Intake 2049 ml  Output 1670 ml  Net 379 ml   Filed Weights   11/30/23 1442 11/30/23 1445  Weight: 99.5 kg 96.8 kg    Examination: General: Middle-aged male, lying on the bed HEENT: [place]/AT, eyes anicteric.  moist mucus membranes Neuro: Alert, awake following commands, left upper extremity is plegic Chest: Coarse breath sounds, faint expiratory wheezes Heart: Regular rate and rhythm, no murmurs or gallops Abdomen: Soft, nontender, nondistended, bowel sounds present Skin: No rash  Labs and images reviewed  Labs/imaging personally reviewed:    Assessment & Plan:  Acute right MCA ischemic stroke R ICA and R MCA occlusion - s/p TNKase   and mechanical thrombectomy with right carotid stenting and  angioplasty Dysphagia Stroke team is following Continue neuro watch Continue secondary stroke prophylaxis Continue aspirin  and Brilinta  Continue statin Continue dysphagia 1 diet and also patient is on tube feeds Continue to work with PT/OT  Acute respiratory insufficiency, resolved Aspiration pneumonia Probable acute COPD exacerbation Remain on room air, required 2 L oxygen overnight likely he will drop his sat below 95 He vomited during intubation, likely have aspirated Today is day 5 for Unasyn  Now he has faint expiratory wheezes much improved from yesterday He quit smoking 7 years ago Continue prednisone  40 mg daily for 5 days Continue scheduled DuoNeb  Hyperlipidemia Continue atorvastatin   Hypothyroidism. Continue levothyroxine   Urinary retention Foley removed on 1/1, no urine output and has required I/O cath x 3 Continue doxazosin  per tube, retention protocol Voiding trial today  Best practice (evaluated daily):  Diet/type: Dysphagia diet 1 DVT prophylaxis: Enoxaparin  Pressure ulcer(s): pressure ulcer assessment deferred  GI prophylaxis: PPI Lines: N/A Foley:  N/A Code Status:  full code Last date of multidisciplinary goals of care discussion: 1/3: Patient was updated at bedside, decision was to continue full scope of care  PCCM will sign off, please call with questions  Labs   CBC: Recent Labs  Lab 11/30/23 1424 11/30/23 1430 12/01/23 0646 12/02/23 0645 12/02/23 1512 12/03/23 0637 12/04/23 1057 12/05/23 0441  WBC 8.0  --  12.7* 13.0*  --  7.4 7.0 7.1  NEUTROABS 4.5  --   --   --   --   --   --   --   HGB 16.4   < > 14.6 13.6 11.6* 13.2 13.7 13.2  HCT 46.8   < > 42.7 38.8* 34.0* 38.2* 38.5* 36.0*  MCV 86.8  --  89.1 87.6  --  88.6 86.3 84.7  PLT 222  --  199 173  --  146* 172 179   < > = values in this interval not displayed.    Basic Metabolic Panel: Recent Labs  Lab 12/01/23 0646 12/02/23 0645 12/02/23 1512 12/02/23 1619 12/02/23 2246  12/03/23 0637 12/04/23 0856 12/05/23 0441  NA 139 137 137 140 141 145 142 140  K 4.2 3.7 3.6  --   --  3.7 4.0 3.5  CL 108 109  --   --   --  119* 115* 113*  CO2 22 21*  --   --   --  18* 19* 20*  GLUCOSE 99 106*  --   --   --  112* 84 132*  BUN 10 10  --   --   --  14 16 21   CREATININE 1.04 0.93  --   --   --  0.91 0.93 0.84  CALCIUM  8.1* 8.0*  --   --   --  8.0* 8.3* 8.2*  MG 1.8 2.0  --   --   --   --  2.2  --   PHOS 3.1  --   --   --   --   --   --   --    GFR: Estimated Creatinine Clearance: 109.6 mL/min (by C-G formula based on SCr of 0.84 mg/dL). Recent Labs  Lab 11/30/23 2018 12/01/23 0646 12/02/23 0645 12/03/23 0637 12/04/23 1057 12/05/23 0441  PROCALCITON <0.10 <0.10  --   --   --   --   WBC  --  12.7* 13.0* 7.4 7.0 7.1    Liver Function Tests: Recent Labs  Lab 11/30/23 1424  AST 24  ALT 23  ALKPHOS 59  BILITOT 0.8  PROT 7.4  ALBUMIN  3.6   No results for input(s): LIPASE, AMYLASE in the last 168 hours. No results for input(s): AMMONIA in the last 168 hours.  ABG    Component Value Date/Time   PHART 7.390 12/02/2023 1512   PCO2ART 35.6 12/02/2023 1512   PO2ART 98 12/02/2023 1512   HCO3 21.4 12/02/2023 1512   TCO2 22 12/02/2023 1512   ACIDBASEDEF 3.0 (H) 12/02/2023 1512   O2SAT 97 12/02/2023 1512     Coagulation Profile: Recent Labs  Lab 11/30/23 1424  INR 1.1    Cardiac Enzymes: No results for input(s): CKTOTAL, CKMB, CKMBINDEX, TROPONINI in the last 168 hours.  HbA1C: Hgb A1c MFr Bld  Date/Time Value Ref Range Status  11/30/2023 08:18 PM 5.9 (H) 4.8 - 5.6 % Final    Comment:    (NOTE)         Prediabetes: 5.7 - 6.4         Diabetes: >6.4         Glycemic control for adults with diabetes: <7.0   01/03/2017 11:51 AM 5.4 4.8 - 5.6 % Final    Comment:    (NOTE)         Pre-diabetes: 5.7 - 6.4         Diabetes: >6.4         Glycemic control for adults with diabetes: <7.0     CBG: Recent Labs  Lab 11/30/23 1420   GLUCAP 116*      Valinda Novas, MD [facility] See Amion for pager If no response to pager, please call [phone] until 7pm After 7pm, Please call E-link [phone]

## 2023-12-05 NOTE — Progress Notes (Addendum)
 STROKE TEAM PROGRESS NOTE   Ian Wong is a 62 y.o. male with past medical history significant for seizures, hypothyroidism, GERD who presented to [facility] ED 12/30 with persistent right gaze deviation and left hemiplegia.  His last known well was 1245 family did call EMS when his symptoms started.  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  Patient was given TNK at [facility] and was transferred to [facility] for emergent thrombectomy. On neurologic neurology exam on arrival to [name] still had persistent left-sided weakness, right gaze, left-sided sensory deficit, left hemianopsia, left sensory neglect. He was then taken emergently to IR. NIH 18 on admit  SIGNIFICANT HOSPITAL EVENTS 12/30: Transferred from [facility] to [facility] for emergent thrombectomy             Mechanical thrombectomy, right carotid stent and angioplasty  --> TICI 3             Left intubated postprocedure             Post procedure CT showed no hemorrhage. 12/31: MRI shows extensive acute right MCA territory infarcts with chronic microhemorrhages versus acute petechial hemorrhages.  1/1: Remains in ICU. Ongoing Neo for BP support. SLP NPO except for meds. 1/2: Hypertonic saline discontinued 1/3: Coretrak placed  Interval history RN at bedside. CCM at bedside.  Patient alert, following commands.  Dysarthria and ;eft arm weakness continues   Patient is on 2 L nasal cannula, exertional wheezing present.  He is on prednisone  and DuoNebs as needed  Discharge plan for CIR. pending transfer out of ICU.    Vitals:   12/05/23 0800 12/05/23 0900 12/05/23 1100 12/05/23 1200  BP: (!) 105/58 (!) 87/53 (!) 113/43 (!) 88/62  Pulse: 78 85 68 72  Resp:      Temp: 99.1 F (37.3 C)   99 F (37.2 C)  TempSrc: Oral   Axillary  SpO2: 99% 96% 95% 93%  Weight:      Height:       CBC:  Recent Labs  Lab 11/30/23 1424 11/30/23 1430 12/04/23 1057  12/05/23 0441  WBC 8.0   < > 7.0 7.1  NEUTROABS 4.5  --   --   --   HGB 16.4   < > 13.7 13.2  HCT 46.8   < > 38.5* 36.0*  MCV 86.8   < > 86.3 84.7  PLT 222   < > 172 179   < > = values in this interval not displayed.   Basic Metabolic Panel:  Recent Labs  Lab 12/01/23 0646 12/02/23 0645 12/02/23 1512 12/04/23 0856 12/05/23 0441  NA 139 137   < > 142 140  K 4.2 3.7   < > 4.0 3.5  CL 108 109   < > 115* 113*  CO2 22 21*   < > 19* 20*  GLUCOSE 99 106*   < > 84 132*  BUN 10 10   < > 16 21  CREATININE 1.04 0.93   < > 0.93 0.84  CALCIUM  8.1* 8.0*   < > 8.3* 8.2*  MG 1.8 2.0  --  2.2  --   PHOS 3.1  --   --   --   --    < > = values in this interval not displayed.   Lipid Panel:  Recent Labs  Lab 12/01/23 0647  CHOL 112  TRIG 146  HDL 26*  CHOLHDL 4.3  VLDL 29  LDLCALC 57   HgbA1c:  Recent Labs  Lab 11/30/23 2018  HGBA1C 5.9*   Urine Drug Screen:  Recent Labs  Lab 12/04/23 2355  LABOPIA NONE DETECTED  COCAINSCRNUR NONE DETECTED  LABBENZ NONE DETECTED  AMPHETMU NONE DETECTED  THCU NONE DETECTED  LABBARB NONE DETECTED    Alcohol Level  Recent Labs  Lab 11/30/23 1424  ETH <10    IMAGING past 24 hours DG Abd Portable 1V Result Date: 12/04/2023 CLINICAL DATA:  Feeding tube placement EXAM: PORTABLE ABDOMEN - 1 VIEW COMPARISON:  Abdominal x-ray 01/12/2017 FINDINGS: Feeding tube is at the level of the gastric antrum. No dilated bowel loops are seen. There is oral contrast in small bowel and colon. IMPRESSION: Feeding tube is at the level of the gastric antrum. Electronically Signed   By: Greig Pique M.D.   On: 12/04/2023 15:48    PHYSICAL EXAM Temp:  [99 F (37.2 C)-100.3 F (37.9 C)] 99 F (37.2 C) (01/04 1200) Pulse Rate:  [56-85] 72 (01/04 1200) Resp:  [12-24] 20 (01/04 0755) BP: (87-149)/(36-104) 88/62 (01/04 1200) SpO2:  [92 %-99 %] 93 % (01/04 1200) FiO2 (%):  [28 %] 28 % (01/03 1924)  General - Well nourished, well developed middle-age  Caucasian male, in no apparent distress. HEENT- atraumatic, normocephalic  Cardiovascular - Regular rhythm and rate. Pulmonary- equal chest rise; unlabored respirations. Bilateral rhonchi and continued wheezing.   Neuro: Mental Status: Patient is awake, alert, oriented to person, place, month, year, and situation.continues to follow all commands.  Patient is able to give a clear and coherent history with non-fluent but improved speech and mild dysarthria.  No signs of aphasia or neglect Cranial Nerves: II: Visual Fields are full. Pupils are equal, round, and reactive to light.   III,IV, VI: Gaze preference seems improved, eyes more midline.  V: Facial sensation is symmetric to light touch VII: Mild left facial weakness.  VIII: hearing is intact to voice X: Uvula elevates symmetrically XI: Shoulder shrug is symmetric. XII: tongue is midline without atrophy or fasciculations.  Motor: Tone is normal. Bulk is normal.  RUE: 4/5, no drift LUE: Flaccid, no spontaneous movement. Currently in soft brace BLE: No drift present antigravity strength.  Sensory: Sensation is symmetric to light touch in the arms and legs. Cerebellar: FNF intact on RUE, HKS are slow, but intact BLE. Gait:  Deferred   ASSESSMENT/PLAN Ian Wong is a 63 y.o. male with history of GERD, Hypothyroid, and seizures presenting with acute stroke.   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  S/p IR with TICI3 and right CAS MRI: moderate acute right MCA territory infarcts, chronic microhemorrhages versus acute petechial hemorrhages seen along the right basal ganglia. Chronic lacunar infarct within  the left thalamus CT repeat stable infarct  2D Echo: LVEF 50 to 55% LDL 57 HgbA1c 5.9 UDS neg VTE prophylaxis - lovenox  aspirin  81 mg daily prior to admission, continue aspirin  81 mg daily and Brilinta  (ticagrelor ) 90 mg bid for carotid stenting Therapy recommendations: CIR Disposition:  pending   Essential Hypertension Hypotension, resolved Home meds:  none Stable now SBP goal above 110 Reamins  off Pressor support Long term BP goal normotensive   COPD exacebation Respiratory distress CCM was on board On prednisone  and nebs Still has exertional wheezing On 2L O2 Stable per CCM  Hyperlipidemia Home meds: Lipitor 20 mg, resumed in hospital LDL 57, goal < 70 Continue statin at discharge   Dysphagia Patient has post-stroke dysphagia, SLP consulted Now on Dysphagia level 1 and honey thick liquids Coretrak placed 1/3 On TF @ 60 with FW 250 Q6   Other Stroke Risk Factors Former Cigarette Smoker   Other Active Problems Hypothyroidism Hx of remote seizure at young age, no seizures for a long time, no AED at home Patient states these were when he was much younger  Hospital day # 5  Pt seen by Neuro NP/APP and later by MD. Note/plan to be edited by MD as needed.    Ian JAYSON Likes, DNP, AGACNP-BC [facility] Please use AMION for contact information & EPIC for messaging.  ATTENDING NOTE: I reviewed above note and agree with the assessment and plan. Pt was seen and examined.   RN is at the bedside. Pt lying in bed, AAO x 3, moderate dysarthria. No aphasia, paucity of language, following all simple commands. Able to name and repeat in dysarthric voice. No gaze palsy, tracking bilaterally, visual field full. Right facial droop. Tongue midline. RUE and BLE at least 4/5, LUE 2/5. Sensation decreased on the left, FTN intact on the right, gait not tested.   Pt still has mild respiratory distress especially with exertion, CCM was on board, now on prednisone  for total  5 days and continue nebulization PRN. Continue ASA and brilinta  for carotid stenting. On statin. Pending CIR.   For detailed assessment and plan, please refer to above/below as I have made changes wherever appropriate.   Ian Cummins, MD PhD Stroke Neurology 12/05/2023 1:02 PM  This patient is critically ill due to carotid occlusion, stroke, s/p mechanical thrombectomy, respiratory distress and at significant risk of neurological worsening, death form recurrent stroke, carotid stent reocclusion, respiratory failure. This patient's care requires constant monitoring of vital signs, hemodynamics, respiratory and cardiac monitoring, review of multiple databases, neurological assessment, discussion with family, other specialists and medical decision making of high complexity. I spent 35 minutes of neurocritical care time in the care of this patient. I discussed with Dr. Harold     To contact Stroke Continuity provider, please refer to [email or web address]. After hours, contact General Neurology

## 2023-12-05 NOTE — Plan of Care (Signed)
  Problem: Education: Goal: Knowledge of disease or condition will improve Outcome: Progressing   Problem: Self-Care: Goal: Ability to participate in self-care as condition permits will improve Outcome: Progressing   Problem: Nutrition: Goal: Risk of aspiration will decrease Outcome: Progressing Goal: Dietary intake will improve Outcome: Progressing

## 2023-12-06 ENCOUNTER — Inpatient Hospital Stay (HOSPITAL_COMMUNITY): Payer: Medicaid Other

## 2023-12-06 DIAGNOSIS — J9601 Acute respiratory failure with hypoxia: Secondary | ICD-10-CM

## 2023-12-06 DIAGNOSIS — I63511 Cerebral infarction due to unspecified occlusion or stenosis of right middle cerebral artery: Secondary | ICD-10-CM | POA: Diagnosis not present

## 2023-12-06 LAB — RESPIRATORY PANEL BY PCR

## 2023-12-06 LAB — BASIC METABOLIC PANEL
Anion gap: 10 (ref 5–15)
BUN: 25 mg/dL — ABNORMAL HIGH (ref 8–23)
CO2: 19 mmol/L — ABNORMAL LOW (ref 22–32)
Calcium: 8.6 mg/dL — ABNORMAL LOW (ref 8.9–10.3)
Chloride: 112 mmol/L — ABNORMAL HIGH (ref 98–111)
Creatinine, Ser: 0.88 mg/dL (ref 0.61–1.24)
GFR, Estimated: >60 mL/min (ref >60)
Glucose, Bld: 167 mg/dL — ABNORMAL HIGH (ref 70–99)
Potassium: 3.8 mmol/L (ref 3.5–5.1)
Sodium: 141 mmol/L (ref 135–145)

## 2023-12-06 LAB — CBC
HCT: 38.6 % — ABNORMAL LOW (ref 39.0–52.0)
Hemoglobin: 13.8 g/dL (ref 13.0–17.0)
MCH: 30.7 pg (ref 26.0–34.0)
MCHC: 35.8 g/dL (ref 30.0–36.0)
MCV: 85.8 fL (ref 80.0–100.0)
Platelets: 190 10*3/uL (ref 150–400)
RBC: 4.5 MIL/uL (ref 4.22–5.81)
RDW: 13.2 % (ref 11.5–15.5)
WBC: 9.5 10*3/uL (ref 4.0–10.5)
nRBC: 0 % (ref 0.0–0.2)

## 2023-12-06 LAB — GLUCOSE, CAPILLARY
Glucose-Capillary: 126 mg/dL — ABNORMAL HIGH (ref 70–99)
Glucose-Capillary: 133 mg/dL — ABNORMAL HIGH (ref 70–99)
Glucose-Capillary: 140 mg/dL — ABNORMAL HIGH (ref 70–99)
Glucose-Capillary: 145 mg/dL — ABNORMAL HIGH (ref 70–99)
Glucose-Capillary: 171 mg/dL — ABNORMAL HIGH (ref 70–99)
Glucose-Capillary: 195 mg/dL — ABNORMAL HIGH (ref 70–99)

## 2023-12-06 MED ORDER — IPRATROPIUM-ALBUTEROL 0.5-2.5 (3) MG/3ML IN SOLN
3.0000 mL | Freq: Four times a day (QID) | RESPIRATORY_TRACT | Status: DC
  Administered 2023-12-07 – 2023-12-11 (×14): 3 mL via RESPIRATORY_TRACT
  Filled 2023-12-06 (×16): qty 3

## 2023-12-06 MED ORDER — PREDNISONE 5 MG PO TABS
50.0000 mg | ORAL_TABLET | Freq: Every day | ORAL | Status: AC
  Administered 2023-12-07 – 2023-12-09 (×3): 50 mg via ORAL
  Filled 2023-12-06 (×3): qty 2

## 2023-12-06 NOTE — PMR Pre-admission (Signed)
 PMR Admission Coordinator Pre-Admission Assessment  Patient: Ian Wong is an 63 y.o., male MRN: [id number] DOB: [birth year] Height: 5' 11 (180.3 cm) Weight: 96.8 kg  Insurance Information HMO:     PPO:      PCP:      IPA:      80/20:      OTHER:  PRIMARY: [facility] medicaid [employer]  Policy [id number] p    Patient - self CM Name: Alejo Bran      Phone#: [phone]     Fax#: [phone] Pre-Cert#: [id number] approved from 12/11/23 to 12/16/23 with update due on 12/16/23      Employer:  Benefits:  Phone #: [phone]     Name: verified on [facility] provider portal Eff. Date: 12/02/23     Deduct: 40      Out of Pocket Max: $0      Life Max: n/a CIR: 100%      SNF: 100% for 90 days Outpatient: 1005 for 27 visit/year combined     Co-Pay: none Home Health: 100% limited to 100 total visits/year      Co-Pay: none DME: 100%     Co-Pay: none Providers: in network   SECONDARY:       Policy#:      Phone#:   Artist:       Phone#:   The Data Processing Manager" for patients in Inpatient Rehabilitation Facilities with attached "Privacy Act Statement-Health Care Records" was provided and verbally reviewed with: Patient  Emergency Contact Information Contact Information     Name Relation Home Work Mobile   [place] Spouse   [phone]      Other Contacts     Name Relation Home Work Mobile   [place] Sister   [phone]   shelton,sherry Sister   [phone]       Current Medical History  Patient Admitting Diagnosis: R MCA CVA  History of Present Illness:  A 63 y.o. male with past medical history significant for seizures, hypothyroidism, GERD who presented to [facility] ED 11/30/23 with persistent right gaze deviation and left hemiplegia.  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  Patient was given TNK at [facility] and was transferred to [facility] 11/30/23 for emergent  thrombectomy.  On neurologic exam on arrival to [facility] patient still had persistent left-sided weakness, right gaze, left-sided sensory deficit, left hemianopsia, left sensory neglect.  He was then taken emergently to IR for Mechanical thrombectomy, right carotid stent and angioplasty  --> TICI 3. He was Left intubated postprocedure.Post procedure CT showed no hemorrhage. On 12/01/23 MRI showed extensive acute right MCA territory infarcts with chronic microhemorrhages versus acute petechial hemorrhages.  Coretrak placed 1/3 as SLP recommended  NPO except for meds. Pt. Was seen by PT/OT/SLP and they recommended CIR to assist return to PLOF.    Complete NIHSS TOTAL: 8  Patient's medical record from [facility] has been reviewed by the rehabilitation admission coordinator and physician.  Past Medical History  Past Medical History:  Diagnosis Date   GERD (gastroesophageal reflux disease)    Hypothyroidism    Seizures (HCC)     Has the patient had major surgery during 100 days prior to admission? Yes  Family History   family history includes COPD in his mother; Other in his brother.  Current Medications  Current Facility-Administered Medications:    acetaminophen  (TYLENOL ) tablet 650 mg, 650 mg, Oral, Q4H PRN, 650  mg at 12/09/23 1540 **OR** acetaminophen  (TYLENOL ) 160 MG/5ML solution 650 mg, 650 mg, Per Tube, Q4H PRN, 650 mg at 12/05/23 1346 **OR** acetaminophen  (TYLENOL ) suppository 650 mg, 650 mg, Rectal, Q4H PRN, Khaliqdina, Salman, MD   aspirin  chewable tablet 81 mg, 81 mg, Oral, Daily, Rosemarie, Pramod S, MD, 81 mg at 12/11/23 9187   atorvastatin  (LIPITOR) tablet 20 mg, 20 mg, Oral, QPM, Sethi, Pramod S, MD, 20 mg at 12/10/23 1717   calcium  carbonate (TUMS - dosed in mg elemental calcium ) chewable tablet 200 mg of elemental calcium , 1 tablet, Oral, BID PRN, Bhagat, Srishti L, MD, 200 mg of elemental calcium  at 12/10/23 2003   Chlorhexidine  Gluconate Cloth 2 % PADS 6 each,  6 each, Topical, Daily, Byrum, Robert S, MD, 6 each at 12/09/23 0848   docusate (COLACE) 50 MG/5ML liquid 100 mg, 100 mg, Oral, Daily, Byrum, Robert S, MD, 100 mg at 12/11/23 9187   enoxaparin  (LOVENOX ) injection 40 mg, 40 mg, Subcutaneous, Q24H, Sethi, Pramod S, MD, 40 mg at 12/10/23 1048   esomeprazole  (NEXIUM ) capsule 20 mg, 20 mg, Oral, QAC breakfast, Ghimire, Kuber, MD, 20 mg at 12/11/23 0643   feeding supplement (ENSURE ENLIVE / ENSURE PLUS) liquid 237 mL, 237 mL, Oral, TID BM, Jerri Pfeiffer, MD, 237 mL at 12/11/23 0813   feeding supplement (OSMOLITE 1.5 CAL) liquid 1,000 mL, 1,000 mL, Per Tube, Q24H, Jerri Pfeiffer, MD, 1,000 mL at 12/10/23 1720   feeding supplement (PROSource TF20) liquid 60 mL, 60 mL, Per Tube, BID, Harold Scholz, MD, 60 mL at 12/11/23 0813   free water  150 mL, 150 mL, Per Tube, Q4H, Jerri Pfeiffer, MD, 150 mL at 12/11/23 0730   insulin  aspart (novoLOG ) injection 0-6 Units, 0-6 Units, Subcutaneous, TID WC, Hongalgi, Anand D, MD, 1 Units at 12/11/23 9357   ipratropium-albuterol  (DUONEB) 0.5-2.5 (3) MG/3ML nebulizer solution 3 mL, 3 mL, Nebulization, Q6H WA, Melvin, Alexander B, MD, 3 mL at 12/11/23 0707   levothyroxine  (SYNTHROID ) tablet 50 mcg, 50 mcg, Oral, Daily, Sethi, Pramod S, MD, 50 mcg at 12/11/23 9356   ondansetron  (ZOFRAN ) injection 4 mg, 4 mg, Intravenous, Q6H PRN, de Macedo Rodrigues, Katyucia, MD, 4 mg at 12/08/23 2233   Oral care mouth rinse, 15 mL, Mouth Rinse, 4 times per day, Harold Scholz, MD, 15 mL at 12/11/23 0730   Oral care mouth rinse, 15 mL, Mouth Rinse, PRN, Chand, Sudham, MD   [COMPLETED] predniSONE  (DELTASONE ) tablet 30 mg, 30 mg, Oral, Q breakfast, 30 mg at 12/11/23 0812 **FOLLOWED BY** [START ON 12/12/2023] predniSONE  (DELTASONE ) tablet 20 mg, 20 mg, Oral, Q breakfast **FOLLOWED BY** [START ON 12/14/2023] predniSONE  (DELTASONE ) tablet 10 mg, 10 mg, Oral, Q breakfast, Hongalgi, Anand D, MD   senna-docusate (Senokot-S) tablet 1 tablet, 1 tablet, Oral, QHS PRN,  Khaliqdina, Salman, MD   ticagrelor  (BRILINTA ) tablet 90 mg, 90 mg, Oral, BID, Sethi, Pramod S, MD, 90 mg at 12/11/23 9187  Patients Current Diet:  Diet Order             DIET DYS 2 Room service appropriate? Yes with Assist; Fluid consistency: Nectar Thick  Diet effective now                   Precautions / Restrictions Precautions Precautions: Fall Precaution Comments: L inattention with some improvement Restrictions Weight Bearing Restrictions Per Provider Order: No   Has the patient had 2 or more falls or a fall with injury in the past year? No  Prior Activity Level Community (  5-7x/wk): Pt active in the community PTA  Prior Functional Level Self Care: Did the patient need help bathing, dressing, using the toilet or eating? Independent  Indoor Mobility: Did the patient need assistance with walking from room to room (with or without device)? Independent  Stairs: Did the patient need assistance with internal or external stairs (with or without device)? Independent  Functional Cognition: Did the patient need help planning regular tasks such as shopping or remembering to take medications? Independent  Patient Information Are you of Hispanic, Latino/a,or Spanish origin?: A. No, not of Hispanic, Latino/a, or Spanish origin What is your race?: A. White Do you need or want an interpreter to communicate with a doctor or health care staff?: 0. No  Patient's Response To:  Health Literacy and Transportation Is the patient able to respond to health literacy and transportation needs?: Yes Health Literacy - How often do you need to have someone help you when you read instructions, pamphlets, or other written material from your doctor or pharmacy?: Never In the past 12 months, has lack of transportation kept you from medical appointments or from getting medications?: No In the past 12 months, has lack of transportation kept you from meetings, work, or from getting things needed for  daily living?: No  Home Assistive Devices / Equipment Home Equipment: None  Prior Device Use: Indicate devices/aids used by the patient prior to current illness, exacerbation or injury? None of the above  Current Functional Level Cognition  Arousal/Alertness: Lethargic Overall Cognitive Status: Impaired/Different from baseline Current Attention Level: Sustained Orientation Level: Oriented X4 Following Commands: Follows one step commands with increased time Safety/Judgement: Decreased awareness of safety, Decreased awareness of deficits General Comments: increased cues to attend to L Attention: Sustained Sustained Attention: Impaired Sustained Attention Impairment: Verbal basic, Functional basic Awareness: Impaired Awareness Impairment: Intellectual impairment Problem Solving: Impaired Problem Solving Impairment: Verbal complex Safety/Judgment: Impaired    Extremity Assessment (includes Sensation/Coordination)  Upper Extremity Assessment: LUE deficits/detail LUE Deficits / Details: pt able to complete Active shouler flexion to ~ 50*, able to demo uncoordinated hand/mouth pattern, ataxia noted during functioanl reach, gross grasp noted at digits, active movement noted in wrist LUE Sensation: decreased proprioception, decreased light touch LUE Coordination: decreased fine motor, decreased gross motor  Lower Extremity Assessment: Generalized weakness LLE Deficits / Details: Neglect on the L,  strength L quads 3/5, o/w 3-/5 LLE Sensation: decreased light touch LLE Coordination: decreased fine motor    ADLs  Overall ADL's : Needs assistance/impaired Eating/Feeding: NPO Grooming: Minimal assistance, Sitting Grooming Details (indicate cue type and reason): decreased awareness of L side of mouth with drool Upper Body Bathing: Moderate assistance Lower Body Bathing: Maximal assistance Upper Body Dressing : Moderate assistance Lower Body Dressing: Maximal assistance Toilet Transfer:  Minimal assistance, Ambulation, Rolling walker (2 wheels) Toilet Transfer Details (indicate cue type and reason): simulated recliner>bed Toileting - Clothing Manipulation Details (indicate cue type and reason): not aware of incontinence of bowel. pt with total (A) for hygiene. pt was able to sustain standing for the peri care General ADL Comments: ADL participation impacted by LUE hemiparesis    Mobility  Overal bed mobility: Needs Assistance Bed Mobility: Supine to Sit, Sit to Supine Rolling: Min assist Supine to sit: Contact guard, HOB elevated, Used rails Sit to supine: Contact guard assist General bed mobility comments: cues for sequencing and to utilize bedrail, increased time to complete but no physcal assist needed    Transfers  Overall transfer level: Needs assistance Equipment used:  Rolling walker (2 wheels) Transfers: Sit to/from Stand, Bed to chair/wheelchair/BSC Sit to Stand: Min assist Bed to/from chair/wheelchair/BSC transfer type:: Step pivot Stand pivot transfers: Contact guard assist Step pivot transfers: Mod assist General transfer comment: min A to place L hand on RW once standing    Ambulation / Gait / Stairs / Wheelchair Mobility  Ambulation/Gait Ambulation/Gait assistance: Mod assist, Min assist Gait Distance (Feet): 18 Feet (+ 105') Assistive device: Rolling walker (2 wheels) Gait Pattern/deviations: Step-to pattern, Decreased step length - right, Decreased step length - left, Decreased stride length, Decreased weight shift to left, Decreased weight shift to right, Trunk flexed, Knee flexed in stance - left General Gait Details: asssit to keep L hand on RW and attend to L environment as pt bumping obstacles and with poor correction of path. mod A to correct L lateral lean with fatigue, cues for forward gaze and upright trunk throughout with pt able to correct but unable to maintain, cues to center self in RW Gait velocity: reduced Gait velocity interpretation:  <1.31 ft/sec, indicative of household ambulator    Posture / Balance Dynamic Sitting Balance Sitting balance - Comments: Pt able to find and hold midline for extend periods of time with UE's, but without assist, cuing only. Balance Overall balance assessment: Needs assistance Sitting-balance support: Single extremity supported, Feet supported Sitting balance-Leahy Scale: Fair Sitting balance - Comments: Pt able to find and hold midline for extend periods of time with UE's, but without assist, cuing only. Postural control: Left lateral lean Standing balance support: Bilateral upper extremity supported, During functional activity Standing balance-Leahy Scale: Poor Standing balance comment: min A for standing balance with R UE support, some L lateral lean with fatigue    Special needs/care consideration Skin Right thigh incision, Diabetic management Hgb A1C 5.9, glucose elevated and receiving insulin  in acute hospital, and Special service needs None   Previous Home Environment (from acute therapy documentation) Living Arrangements: Spouse/significant other  Lives With: Spouse, [name]) Available Help at Discharge: Friend(s), Available 24 hours/day Type of Home: Mobile home Home Layout: One level Home Access: Ramped entrance Bathroom Shower/Tub: Engineer, Manufacturing Systems: Standard Bathroom Accessibility: Yes How Accessible: Accessible via wheelchair, Accessible via walker Home Care Services: No Additional Comments: wife is home with son with physical disability during the day  Discharge Living Setting Plans for Discharge Living Setting: Patient's home Type of Home at Discharge: Mobile home Discharge Home Layout: One level Discharge Home Access: Ramped entrance Discharge Bathroom Shower/Tub: Tub only, Tub/shower unit Discharge Bathroom Toilet: Standard Discharge Bathroom Accessibility: Yes How Accessible: Accessible via walker  Social/Family/Support  Systems Patient Roles: Spouse Contact Information: [phone] Anticipated Caregiver: [name] Caregiver Availability: 24/7 Discharge Plan Discussed with Primary Caregiver: Yes Is Caregiver In Agreement with Plan?: Yes Does Caregiver/Family have Issues with Lodging/Transportation while Pt is in Rehab?: No  Goals Patient/Family Goal for Rehab: PT/OT/SLP Supervision level Expected length of stay: 18-21 days Pt/Family Agrees to Admission and willing to participate: No Program Orientation Provided & Reviewed with Pt/Caregiver Including Roles  & Responsibilities: Yes  Decrease burden of Care through IP rehab admission: not anticipated  Possible need for SNF placement upon discharge: not anticipated  Patient Condition: I have reviewed medical records from [facility], spoken with CM, and patient and spouse. I met with patient at the bedside for inpatient rehabilitation assessment.  Patient will benefit from ongoing PT and OT, can actively participate in 3 hours of therapy a day 5 days of  the week, and can make measurable gains during the admission.  Patient will also benefit from the coordinated team approach during an Inpatient Acute Rehabilitation admission.  The patient will receive intensive therapy as well as Rehabilitation physician, nursing, social worker, and care management interventions.  Due to safety, skin/wound care, disease management, medication administration, pain management, and patient education the patient requires 24 hour a day rehabilitation nursing.  The patient is currently min A  with mobility and basic ADLs.  Discharge setting and therapy post discharge at home with home health is anticipated.  Patient has agreed to participate in the Acute Inpatient Rehabilitation Program and will admit today.  Preadmission Screen Completed By:  Lovett CHRISTELLA Ropes, 12/11/2023 10:27 AM ______________________________________________________________________   Discussed status with  Dr. Babs on 12/11/23 at 0945 and received approval for admission today.  Admission Coordinator:  Lovett CHRISTELLA Ropes, RN, time 1034/Date 12/11/23   Assessment/Plan: Diagnosis: Right MCA infarct Does the need for close, 24 hr/day Medical supervision in concert with the patient's rehab needs make it unreasonable for this patient to be served in a less intensive setting? Yes Co-Morbidities requiring supervision/potential complications: sz, gerd Due to bladder management, bowel management, safety, skin/wound care, disease management, medication administration, pain management, and patient education, does the patient require 24 hr/day rehab nursing? Yes Does the patient require coordinated care of a physician, rehab nurse, PT, OT, and SLP to address physical and functional deficits in the context of the above medical diagnosis(es)? Yes Addressing deficits in the following areas: balance, endurance, locomotion, strength, transferring, bowel/bladder control, bathing, dressing, feeding, grooming, toileting, cognition, speech, swallowing, and psychosocial support Can the patient actively participate in an intensive therapy program of at least 3 hrs of therapy 5 days a week? Yes The potential for patient to make measurable gains while on inpatient rehab is excellent Anticipated functional outcomes upon discharge from inpatient rehab: supervision PT, supervision OT, supervision SLP Estimated rehab length of stay to reach the above functional goals is: 18-21 days Anticipated discharge destination: Home 10. Overall Rehab/Functional Prognosis: excellent   MD Signature: Arthea IVAR Babs, MD, [facility] Physical Medicine & Rehabilitation Medical Director Rehabilitation Services 12/11/2023

## 2023-12-06 NOTE — Consult Note (Signed)
 Consult note  Ian Wong FMW:[id number] DOB: [birth year] DOA: 11/30/2023 PCP: Patient, No Pcp Per  Primary service: Stroke, Md, MD  Referring physician: Stroke, Md, MD Reason for consult: Respiratory failure  HPI/Course: Ian Wong is a 63 y.o. male with past medical history of PAD, limb ischemia, Leriche syndrome, GERD, hypothyroidism, seizures presenting with a stroke on 12/30.  Patient presented to [facility] and was found to have right ICA and right MCA occlusion.  Received TNK at [facility] and had continued deficit at [facility].  Was sent for intervention by IR with thrombectomy, right carotid stent placed with movement and angioplasty.  Remained intubated postprocedure due to concern for possible aspiration event given vomiting during intubation.  Extubated 12/31.  On 1/10 was noted to be some worsening mass effect in the setting of his large MCA infarct.  Patient has received 3% normal saline.  While in the ICU was seen by pulmonology/critical care for ventilation management and continued assistance with respiratory failure after patient came off of the vent on 12/31.  He has completed 5 days of Unasyn  for possible aspiration pneumonia and currently has no leukocytosis.  Has had a COVID screen which is negative.  Had procalcitonin checked on 12/31 which was less than 0.1.  Concern is for undiagnosed COPD and exacerbation.  Has been started on prednisone  and scheduled DuoNebs.  Some continued wheezing, but improving.  As of yesterday patient was on room air during the day but needing oxygen at night.  Hospital service consulted for continued assistance in managing improving respiratory failure now the patient is out of the ICU.  Patient denies fevers, chills, chest pain, abdominal pain, constipation, diarrhea, nausea, vomiting.  Review of Systems: As per HPI otherwise all other systems reviewed and are negative.  Past Medical History:  Diagnosis Date   GERD  (gastroesophageal reflux disease)    Hypothyroidism    Seizures (HCC)     Past Surgical History:  Procedure Laterality Date   AORTA - BILATERAL FEMORAL ARTERY BYPASS GRAFT Bilateral 01/07/2017   Procedure: AORTOBIFEMORAL BYPASS GRAFT;  Surgeon: Krystal JULIANNA Doing, MD;  Location: [facility] OR;  Service: Vascular;  Laterality: Bilateral;   I & D EXTREMITY Left 11/28/2014   Procedure: IRRIGATION AND DEBRIDEMENT;  REPAIR OF CRUSH INJURY;  Surgeon: Franky Curia, MD;  Location: [facility] OR;  Service: Orthopedics;  Laterality: Left;   INCISIONAL HERNIA REPAIR N/A 10/26/2017   Procedure: HERNIA REPAIR INCISIONAL WITH MESH;  Surgeon: Mavis Anes, MD;  Location: [facility] ORS;  Service: General;  Laterality: N/A;   IR CT HEAD LTD  11/30/2023   IR INTRAVSC STENT CERV CAROTID W/EMB-PROT MOD SED INCL ANGIO  11/30/2023   IR PERCUTANEOUS ART THROMBECTOMY/INFUSION INTRACRANIAL INC DIAG ANGIO  11/30/2023   IR US  GUIDE VASC ACCESS RIGHT  11/30/2023   RADIOLOGY WITH ANESTHESIA N/A 11/30/2023   Procedure: IR WITH ANESTHESIA;  Surgeon: Radiologist, Medication, MD;  Location: [facility] OR;  Service: Radiology;  Laterality: N/A;    Social History  reports that he quit smoking about 7 years ago. His smoking use included cigarettes. He has never used smokeless tobacco. He reports that he does not currently use alcohol. He reports that he does not use drugs.  No Known Allergies  Family History  Problem Relation Age of Onset   COPD Mother    Other Brother   Reviewed on admission  Prior to Admission medications   Medication Sig Start Date End Date Taking? Authorizing Provider  aspirin  EC 81 MG  tablet Take 81 mg by mouth daily.   Yes [provider]  atorvastatin  (LIPITOR) 20 MG tablet Take 20 mg by mouth every evening. 09/21/17  Yes [provider]  esomeprazole  (NEXIUM  24HR) 20 MG capsule Take 20 mg daily at 12 noon by mouth.   Yes [provider]  ibuprofen (ADVIL) 800 MG tablet Take 800 mg by mouth every 8  (eight) hours as needed for moderate pain (pain score 4-6). 11/25/23  Yes [provider]  levothyroxine  (SYNTHROID , LEVOTHROID) 50 MCG tablet Take 50 mcg by mouth daily. 11/20/16  Yes [provider]  Omega-3 Fatty Acids (OMEGA 3 PO) Take 1 g by mouth 2 (two) times daily.   Yes [provider]    Physical Exam: Vitals:   12/06/23 0300 12/06/23 0804 12/06/23 0815 12/06/23 1142  BP: 122/64 119/62  126/66  Pulse:  85 85 74  Resp: (!) 23  (!) 23   Temp: 97.7 F (36.5 C) (!) 97 F (36.1 C)  98.9 F (37.2 C)  TempSrc: Oral Oral  Axillary  SpO2: 94% 95% 95% 95%  Weight:      Height:        Physical Exam Constitutional:      General: He is not in acute distress.    Appearance: Normal appearance. He is obese.  HENT:     Head: Normocephalic and atraumatic.     Mouth/Throat:     Mouth: Mucous membranes are moist.     Pharynx: Oropharynx is clear.  Eyes:     Extraocular Movements: Extraocular movements intact.     Pupils: Pupils are equal, round, and reactive to light.  Cardiovascular:     Rate and Rhythm: Normal rate and regular rhythm.     Pulses: Normal pulses.     Heart sounds: Normal heart sounds.  Pulmonary:     Effort: Pulmonary effort is normal. No respiratory distress.     Breath sounds: Stridor present. Wheezing present.     Comments: Possible true deep wheezes, however difficult to determine with significant transmitted upper airway noises concerning for stridor like sounds. Abdominal:     General: Bowel sounds are normal. There is no distension.     Palpations: Abdomen is soft.     Tenderness: There is no abdominal tenderness.  Musculoskeletal:        General: No swelling or deformity.  Skin:    General: Skin is warm and dry.  Neurological:     Mental Status: Mental status is at baseline.     Comments: Known deficits    Labs on Admission: I have personally reviewed following labs and imaging studies  CBC: Recent Labs  Lab  11/30/23 1424 11/30/23 1430 12/02/23 0645 12/02/23 1512 12/03/23 0637 12/04/23 1057 12/05/23 0441 12/06/23 0613  WBC 8.0   < > 13.0*  --  7.4 7.0 7.1 9.5  NEUTROABS 4.5  --   --   --   --   --   --   --   HGB 16.4   < > 13.6 11.6* 13.2 13.7 13.2 13.8  HCT 46.8   < > 38.8* 34.0* 38.2* 38.5* 36.0* 38.6*  MCV 86.8   < > 87.6  --  88.6 86.3 84.7 85.8  PLT 222   < > 173  --  146* 172 179 190   < > = values in this interval not displayed.    Basic Metabolic Panel: Recent Labs  Lab 12/01/23 0646 12/02/23 0645 12/02/23 1512 12/02/23  1619 12/02/23 2246 12/03/23 9362 12/04/23 0856 12/05/23 0441 12/06/23 0613  NA 139 137 137   < > 141 145 142 140 141  K 4.2 3.7 3.6  --   --  3.7 4.0 3.5 3.8  CL 108 109  --   --   --  119* 115* 113* 112*  CO2 22 21*  --   --   --  18* 19* 20* 19*  GLUCOSE 99 106*  --   --   --  112* 84 132* 167*  BUN 10 10  --   --   --  14 16 21  25*  CREATININE 1.04 0.93  --   --   --  0.91 0.93 0.84 0.88  CALCIUM  8.1* 8.0*  --   --   --  8.0* 8.3* 8.2* 8.6*  MG 1.8 2.0  --   --   --   --  2.2  --   --   PHOS 3.1  --   --   --   --   --   --   --   --    < > = values in this interval not displayed.   GFR: Estimated Creatinine Clearance: 104.6 mL/min (by C-G formula based on SCr of 0.88 mg/dL).  Liver Function Tests: Recent Labs  Lab 11/30/23 1424  AST 24  ALT 23  ALKPHOS 59  BILITOT 0.8  PROT 7.4  ALBUMIN  3.6    Urine analysis:    Component Value Date/Time   COLORURINE YELLOW 01/18/2014 1830   APPEARANCEUR CLEAR 01/18/2014 1830   LABSPEC 1.020 01/18/2014 1830   PHURINE 6.0 01/18/2014 1830   GLUCOSEU NEGATIVE 01/18/2014 1830   HGBUR NEGATIVE 01/18/2014 1830   BILIRUBINUR NEGATIVE 01/18/2014 1830   KETONESUR NEGATIVE 01/18/2014 1830   PROTEINUR NEGATIVE 01/18/2014 1830   UROBILINOGEN 0.2 01/18/2014 1830   NITRITE NEGATIVE 01/18/2014 1830   LEUKOCYTESUR NEGATIVE 01/18/2014 1830    Radiological Exams on Admission: No results found.  EKG:  Last performed on 12/30 showed sinus rhythm at 76 bpm.  Baseline artifact.  Low voltage multiple leads.  Nonspecific T wave changes.  Some baseline wander.  Assessment/Plan Principal Problem:   Acute ischemic right MCA stroke (HCC) Active Problems:   Acute right MCA stroke (HCC)   Acute hypoxic respiratory failure (HCC)   Acute metabolic encephalopathy   Aspiration pneumonia (HCC)   Acute respiratory failure with hypoxia > As per HPI/course there is concern for aspiration during intubation and patient remained intubated post procedure by interventional radiology. > Was extubated on 12/31 and has continued need some oxygen.  Now has been requiring 2 L at night and room air during the day.  Continued wheezing that has been improving per last pulmonology note. > Has completed 5-day course of Unasyn .  Has been started on prednisone  and scheduled DuoNebs for presumed undiagnosed COPD exacerbation. > Negative for COVID, normal procalcitonin on 12/31.  No leukocytosis. > Most recent chest x-rays on 12/21 and 1/1 are also clear. > Could be prolonged exacerbation in the setting of aspiration.  However given transmitted upper airway sounds that her stridor like in nature concern for possible vocal cord injury or other abnormality/injury possibly related to recent intubation.  Will get CT to further evaluate. - Continue to monitor on telemetry - Continue with supplemental oxygen as needed/nightly - May need steroid course/taper - Continue scheduled DuoNeb - Add as needed albuterol  - Check full respiratory viral panel to further rule out viral trigger -  CT neck with contrast  Acute CVA > Large MCA territory CVA secondary to occlusion. - Management per stroke team  PAD - Continue with aspirin , statin, Brilinta   GERD - Continue as needed Tums  Hypothyroidism - Continue home Synthroid   History of seizure disorder - Per neurology/primary - Not currently on any antiepileptics  TR[facility] will  continue to follow the patient.   Marsa KATHEE Scurry MD [facility]  How to contact the [facility] Attending or Consulting provider 7A - 7P or covering provider during after hours 7P -7A, for this patient?   Check the care team in [facility] and look for a) attending/consulting TR[facility] provider listed and b) the TR[facility] team listed Log into [email or web address] and use [place]'s universal password to access. If you do not have the password, please contact the hospital operator. Locate the [facility] provider you are looking for under [facility] and page to a number that you can be directly reached. If you still have difficulty reaching the provider, please page the [facility] (Director on Call) for the Hospitalists listed on amion for assistance.  12/06/2023, 2:49 PM

## 2023-12-06 NOTE — Progress Notes (Addendum)
 STROKE TEAM PROGRESS NOTE   [name] is a 63 y.o. male with past medical history significant for seizures, hypothyroidism, GERD who presented to [name] ED 12/30 with persistent right gaze deviation and left hemiplegia.  His last known well was 1245 family did call EMS when his symptoms started.  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  Patient was given TNK at [facility] and was transferred to [facility] for emergent thrombectomy. On neurologic neurology exam on arrival to [facility] patient still had persistent left-sided weakness, right gaze, left-sided sensory deficit, left hemianopsia, left sensory neglect. He was then taken emergently to IR. NIH 18 on admit  SIGNIFICANT HOSPITAL EVENTS 12/30: Transferred from [name] to [facility] for emergent thrombectomy             Mechanical thrombectomy, right carotid stent and angioplasty  --> TICI 3             Left intubated postprocedure             Post procedure CT showed no hemorrhage. 12/31: MRI shows extensive acute right MCA territory infarcts with chronic microhemorrhages versus acute petechial hemorrhages.  1/1: Remains in ICU. Ongoing Neo for BP support. SLP NPO except for meds. 1/2: Hypertonic saline discontinued 1/3: Coretrak placed  Interval history  Patient alert, following commands.  Dysarthria and ;eft arm weakness continues   Neurological exam stable. No acute events overnight.  Patient continues to have some exertional wheezing, continues on prednisone  and duo-nebs, will monitor.   Discharge plan for CIR, pending their full evaluation.    Vitals:   12/06/23 0300 12/06/23 0804 12/06/23 0815 12/06/23 1142  BP: 122/64 119/62  126/66  Pulse:  85 85 74  Resp: (!) 23  (!) 23   Temp: 97.7 F (36.5 C) (!) 97 F (36.1 C)  98.9 F (37.2 C)  TempSrc: Oral Oral  Axillary  SpO2: 94% 95% 95% 95%  Weight:      Height:       CBC:  Recent Labs  Lab  11/30/23 1424 11/30/23 1430 12/05/23 0441 12/06/23 0613  WBC 8.0   < > 7.1 9.5  NEUTROABS 4.5  --   --   --   HGB 16.4   < > 13.2 13.8  HCT 46.8   < > 36.0* 38.6*  MCV 86.8   < > 84.7 85.8  PLT 222   < > 179 190   < > = values in this interval not displayed.   Basic Metabolic Panel:  Recent Labs  Lab 12/01/23 0646 12/02/23 0645 12/02/23 1512 12/04/23 0856 12/05/23 0441 12/06/23 0613  NA 139 137   < > 142 140 141  K 4.2 3.7   < > 4.0 3.5 3.8  CL 108 109   < > 115* 113* 112*  CO2 22 21*   < > 19* 20* 19*  GLUCOSE 99 106*   < > 84 132* 167*  BUN 10 10   < > 16 21 25*  CREATININE 1.04 0.93   < > 0.93 0.84 0.88  CALCIUM  8.1* 8.0*   < > 8.3* 8.2* 8.6*  MG 1.8 2.0  --  2.2  --   --   PHOS 3.1  --   --   --   --   --    < > = values in this interval not displayed.   Lipid Panel:  Recent  Labs  Lab 12/01/23 0647  CHOL 112  TRIG 146  HDL 26*  CHOLHDL 4.3  VLDL 29  LDLCALC 57   HgbA1c:  Recent Labs  Lab 11/30/23 2018  HGBA1C 5.9*   Urine Drug Screen:  Recent Labs  Lab 12/04/23 2355  LABOPIA NONE DETECTED  COCAINSCRNUR NONE DETECTED  LABBENZ NONE DETECTED  AMPHETMU NONE DETECTED  THCU NONE DETECTED  LABBARB NONE DETECTED    Alcohol Level  Recent Labs  Lab 11/30/23 1424  ETH <10    IMAGING past 24 hours No results found.   PHYSICAL EXAM Temp:  [97 F (36.1 C)-99 F (37.2 C)] 98.9 F (37.2 C) (01/05 1142) Pulse Rate:  [67-86] 74 (01/05 1142) Resp:  [18-28] 23 (01/05 0815) BP: (88-129)/(42-99) 126/66 (01/05 1142) SpO2:  [91 %-95 %] 95 % (01/05 1142)  General - Well nourished, well developed middle-age Caucasian male, in no apparent distress. HEENT- atraumatic, normocephalic  Cardiovascular - Regular rhythm and rate. Pulmonary- equal chest rise; unlabored respirations. Bilateral rhonchi and continued wheezing.   Neuro: Mental Status: Patient is awake, alert, oriented to person, place, month, year, and situation.continues to follow all commands.   Patient is able to give a clear and coherent history with moderate dysarthria. No aphasia.  No signs of aphasia or neglect Cranial Nerves: II: Visual Fields are full. Pupils are equal, round, and reactive to light.   III,IV, VI: o gaze palsy, tracking examiner bilaterally  V: Facial sensation is symmetric to light touch CPP:Mphyu facial droop VIII: hearing is intact to voice X: Uvula elevates symmetrically XI: Shoulder shrug is symmetric. XII: tongue is midline  Motor: Tone is normal. Bulk is normal.  RUE: 4/5, no drift LUE: slight movement seen, 2/5. Lateral movement greater than vertical. Continue soft brace.  BLE: No drift present antigravity strength.  Sensory: Sensation is symmetric to light touch in the arms and legs. Cerebellar: FNF intact on RUE, no ataxia seen. Gait:  Deferred   ASSESSMENT/PLAN [name] is a 63 y.o. male with history of GERD, Hypothyroid, and seizures presenting with acute stroke.   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  S/p IR with TICI3 and right CAS MRI: moderate acute right MCA territory infarcts, chronic microhemorrhages versus acute petechial hemorrhages seen along the right basal ganglia. Chronic lacunar infarct within the left thalamus CT repeat stable infarct  2D Echo: LVEF 50 to 55% LDL 57 HgbA1c 5.9 UDS neg VTE prophylaxis - lovenox  aspirin  81 mg daily prior to admission, continue aspirin  81 mg daily and Brilinta  (ticagrelor ) 90 mg bid for carotid stenting Therapy recommendations: CIR Disposition:  pending   Essential Hypertension Hypotension, resolved Home meds:  none Stable now SBP goal above  110 Long term BP goal normotensive   COPD exacebation Respiratory distress CCM was on board, signed off 1/4 On prednisone  for 5 days and nebs Still has exertional wheezing On 2L O2 Hospital service consulted for further assistance  Hyperlipidemia Home meds: Lipitor 20 mg, resumed in hospital LDL 57, goal < 70 Continue statin at discharge   Dysphagia Patient has post-stroke dysphagia, SLP consulted Now on Dysphagia level 1 and honey thick liquids Coretrak placed  1/3 On TF @ 60 with FW 250 Q6   Other Stroke Risk Factors Former Cigarette Smoker   Other Active Problems Hypothyroidism Hx of remote seizure at young age, no seizures for a long time, no AED at home  Hospital day # 6  Pt seen by Neuro NP/APP and later by MD. Note/plan to be edited by MD as needed.    Rocky JAYSON Likes, DNP, AGACNP-BC [facility] Please use AMION for contact information & EPIC for messaging.  ATTENDING NOTE: I reviewed above note and agree with the assessment and plan. Pt was seen and examined.   Son and daughter at bedside.  Daughter is a physical therapist, patient was put on bedside chair, still has exertional wheezing, but neuro stable on DAPT and statin.  BP has improved some, now 120s.  Still on honey thick liquid, continue tube feeding.  Per family, patient had no exertional breathing at home, will consult internal medicine team for further assistance.  PT and OT recommend CIR.  For detailed assessment and plan, please refer to above/below as I have made changes wherever appropriate.   Ary Cummins, MD PhD Stroke Neurology 12/06/2023 5:24 PM

## 2023-12-06 NOTE — Progress Notes (Signed)
 Inpatient Rehab Admissions Coordinator:   I spoke with Pt.'s wife [name] regarding caregiver support. She states that she is struggling to care for their disabled son alone and that she can provide no more than supervision at home for this Pt. Right now, I am not confident that CIR can get him to supervision level, but I would like to monitor progress over the next few therapy sessions, as this could change if Pt. Progresses well.  Leita Kleine, MS, CCC-SLP Rehab Admissions Coordinator  [phone] (celll) [phone] (office)

## 2023-12-07 ENCOUNTER — Inpatient Hospital Stay (HOSPITAL_COMMUNITY): Payer: Medicaid Other

## 2023-12-07 LAB — CBC
HCT: 39.5 % (ref 39.0–52.0)
Hemoglobin: 13.8 g/dL (ref 13.0–17.0)
MCH: 30.5 pg (ref 26.0–34.0)
MCHC: 34.9 g/dL (ref 30.0–36.0)
MCV: 87.2 fL (ref 80.0–100.0)
Platelets: 176 10*3/uL (ref 150–400)
RBC: 4.53 MIL/uL (ref 4.22–5.81)
RDW: 13.5 % (ref 11.5–15.5)
WBC: 10.1 10*3/uL (ref 4.0–10.5)
nRBC: 0 % (ref 0.0–0.2)

## 2023-12-07 LAB — GLUCOSE, CAPILLARY
Glucose-Capillary: 132 mg/dL — ABNORMAL HIGH (ref 70–99)
Glucose-Capillary: 126 mg/dL — ABNORMAL HIGH (ref 70–99)
Glucose-Capillary: 170 mg/dL — ABNORMAL HIGH (ref 70–99)
Glucose-Capillary: 167 mg/dL — ABNORMAL HIGH (ref 70–99)
Glucose-Capillary: 184 mg/dL — ABNORMAL HIGH (ref 70–99)
Glucose-Capillary: 192 mg/dL — ABNORMAL HIGH (ref 70–99)
Glucose-Capillary: 156 mg/dL — ABNORMAL HIGH (ref 70–99)
Glucose-Capillary: 170 mg/dL — ABNORMAL HIGH (ref 70–99)

## 2023-12-07 LAB — BASIC METABOLIC PANEL
Anion gap: 9 (ref 5–15)
BUN: 29 mg/dL — ABNORMAL HIGH (ref 8–23)
CO2: 20 mmol/L — ABNORMAL LOW (ref 22–32)
Calcium: 8.5 mg/dL — ABNORMAL LOW (ref 8.9–10.3)
Chloride: 108 mmol/L (ref 98–111)
Creatinine, Ser: 0.83 mg/dL (ref 0.61–1.24)
GFR, Estimated: >60 mL/min (ref >60)
Glucose, Bld: 181 mg/dL — ABNORMAL HIGH (ref 70–99)
Potassium: 3.9 mmol/L (ref 3.5–5.1)
Sodium: 137 mmol/L (ref 135–145)

## 2023-12-07 MED ORDER — INSULIN ASPART 100 UNIT/ML IJ SOLN
0.0000 [IU] | Freq: Four times a day (QID) | INTRAMUSCULAR | Status: DC
  Administered 2023-12-07 (×2): 2 [IU] via SUBCUTANEOUS
  Administered 2023-12-08: 1 [IU] via SUBCUTANEOUS
  Administered 2023-12-08: 2 [IU] via SUBCUTANEOUS
  Administered 2023-12-08 – 2023-12-09 (×4): 1 [IU] via SUBCUTANEOUS

## 2023-12-07 MED ORDER — ESOMEPRAZOLE MAGNESIUM 20 MG PO CPDR
20.0000 mg | DELAYED_RELEASE_CAPSULE | Freq: Every day | ORAL | Status: DC
  Administered 2023-12-08 – 2023-12-11 (×4): 20 mg via ORAL
  Filled 2023-12-07 (×4): qty 1

## 2023-12-07 MED ORDER — CALCIUM CARBONATE ANTACID 500 MG PO CHEW
1.0000 | CHEWABLE_TABLET | Freq: Two times a day (BID) | ORAL | Status: DC | PRN
  Administered 2023-12-07 – 2023-12-10 (×4): 200 mg via ORAL
  Filled 2023-12-07 (×4): qty 1

## 2023-12-07 MED ORDER — NON FORMULARY: 20.0000 mg | Freq: Every day | Status: DC

## 2023-12-07 MED ORDER — IOHEXOL 350 MG/ML SOLN
75.0000 mL | Freq: Once | INTRAVENOUS | Status: AC | PRN
  Administered 2023-12-07: 75 mL via INTRAVENOUS

## 2023-12-07 NOTE — Progress Notes (Signed)
 This RN went into the patient's room secondary to loud bouts of cough. Patient had brown liquid vomit on his gown, the floor and was trying to use the oral suction.   RN stopped the tube feed, suctioned the patient, and reassured the patient.   There was no change in mentation. Wheezing, and crackles noted upon auscultation, negative cyanosis. ?Aspiration, recommend xray to confirm or R/o.  Would notify provider and primary RN would be made aware.    12/07/23 1210  Vitals  Temp 97.8 F (36.6 C)  Temp Source Axillary  BP 135/68  MAP (mmHg) 85  BP Location Left Wrist  BP Method Automatic  Patient Position (if appropriate) Sitting  Pulse Rate 80  Pulse Rate Source Dinamap  ECG Heart Rate 79  Resp (!) 22  Level of Consciousness  Level of Consciousness Alert  MEWS COLOR  MEWS Score Color Green  Oxygen Therapy  SpO2 93 %  O2 Device Nasal Cannula  O2 Flow Rate (L/min) 2 L/min  MEWS Score  MEWS Temp 0  MEWS Systolic 0  MEWS Pulse 0  MEWS RR 1  MEWS LOC 0  MEWS Score 1

## 2023-12-07 NOTE — TOC Progression Note (Signed)
 Transition of Care [facility]) - Progression Note    Patient Details  Name: Ian Wong MRN: [id number] Date of Birth: Nov 10, 1961  [facility] CM/SW Contact  Andrez JULIANNA George, RN Phone Number: 12/07/2023, 3:39 PM  Clinical Narrative:     CIR following for potential rehab admission. Pt continues with cortrak. TOC following.  Expected Discharge Plan: IP Rehab Facility    Expected Discharge Plan and Services                                               Social Determinants of Health (SDOH) Interventions SDOH Screenings   Tobacco Use: Medium Risk (11/30/2023)    Readmission Risk Interventions     No data to display

## 2023-12-07 NOTE — Progress Notes (Signed)
 PROGRESS NOTE    Ian Wong  FMW:[id number] DOB: 25-Dec-1960 DOA: 11/30/2023 PCP: Patient, No Pcp Per    Brief Narrative:  63 year old with history of peripheral artery disease, Larrick syndrome, GERD, hypothyroidism who presented to [facility] with persistent right gaze deviation and left hemiaplasia.  CT head was negative.  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  He was given TNK at any pain and was transferred to [facility] for emergent thrombectomy.  Patient was intubated after mechanical thrombectomy and subsequently extubated next day.  He was transferred to medical floor. TRH consulted for ongoing medical management, wheezing and stridor.  Subjective: Patient seen in the morning rounds.  Denies any complaints at rest.  On tube feeding.  He keeps putting his suction deep into his throat and had an episode of vomiting.  Dysarthric but able to understand.  Assessment & Plan:   Acute respiratory insufficiency secondary to procedures and stroke. Wheezing, upper airway sounds and stridor:  Patient is currently on 1 to 2 L of oxygen.  Remains fairly stable at rest.  Completed 5 days course of Unasyn  for suspected aspiration.  COVID-19 negative, respiratory virus panel negative, procalcitonin normal.  Most of the chest x-rays are normal. Treating with short course of prednisone , continue for 3 days.  As needed albuterol .  Start chest physiotherapy and incentive.  Mucolytic's. CT scan soft tissue of the neck without any complications or swelling.  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  Treated with TNK and then with thrombectomy.  Repeat CT head stable. Echocardiogram with ejection fraction 50 to 55%. LDL 57 A1c 5.9 Previously on aspirin .  Currently on aspirin  and Brilinta  for 90 days for carotid stenting. Followed by neurology.  Referred to CIR. Dysphagia due to stroke, currently remains on  dysphagia level 1 diet with honey thick liquids.  Core track placed 1/3.  Currently tolerating full amount of tube feeding.  Essential hypertension, hypotensive on arrival: Blood pressure is stabilized now.  Off antihypertensives.  Hypothyroidism: On Synthroid . History of seizure: No recent known seizures.  Thank you for allowing us  to continue care for Ian Wong.  We will continue to follow.     DVT prophylaxis: enoxaparin  (LOVENOX ) injection 40 mg Start: 12/03/23 1100 SCD's Start: 11/30/23 1607   Code Status: Full code Family Communication: None at the bedside Disposition Plan: Status is: Inpatient Remains inpatient appropriate because: Waiting to go to CIR     Consultants:  TRH  Procedures:  Artery stenting  Antimicrobials:  Completed     Objective: Vitals:   12/07/23 0823 12/07/23 1102 12/07/23 1104 12/07/23 1210  BP:  (!) 144/74 (!) 144/74 135/68  Pulse: 72 76 81 80  Resp: 20 20 20  (!) 22  Temp:  97.7 F (36.5 C) 97.7 F (36.5 C) 97.8 F (36.6 C)  TempSrc:  Oral Oral Axillary  SpO2: 95% 96% 96% 93%  Weight:      Height:        Intake/Output Summary (Last 24 hours) at 12/07/2023 1300 Last data filed at 12/07/2023 1159 Gross per 24 hour  Intake 2528 ml  Output 910 ml  Net 1618 ml   Filed Weights   11/30/23 1442 11/30/23 1445  Weight: 99.5 kg 96.8 kg    Examination:  General exam: Appears calm and comfortable at rest. Respiratory system: Clear to auscultation.  Mostly upper airway sounds conducted.  He does have some audible wheezing. Cardiovascular system: S1 &  S2 heard, RRR. No pedal edema. Gastrointestinal system: Soft.  Nontender.  Bowel sound present.   Central nervous system: Alert and oriented. Dysarthric.  Right-sided facial droop.  Left upper extremity mostly proximal muscles are stronger than distal.  Left lower extremity.  Both lower extremity 4 x 5.    Data Reviewed: I have personally reviewed following labs and imaging  studies  CBC: Recent Labs  Lab 11/30/23 1424 11/30/23 1430 12/03/23 0637 12/04/23 1057 12/05/23 0441 12/06/23 0613 12/07/23 0624  WBC 8.0   < > 7.4 7.0 7.1 9.5 10.1  NEUTROABS 4.5  --   --   --   --   --   --   HGB 16.4   < > 13.2 13.7 13.2 13.8 13.8  HCT 46.8   < > 38.2* 38.5* 36.0* 38.6* 39.5  MCV 86.8   < > 88.6 86.3 84.7 85.8 87.2  PLT 222   < > 146* 172 179 190 176   < > = values in this interval not displayed.   Basic Metabolic Panel: Recent Labs  Lab 12/01/23 0646 12/02/23 0645 12/02/23 1512 12/03/23 9362 12/04/23 0856 12/05/23 0441 12/06/23 0613 12/07/23 0624  NA 139 137   < > 145 142 140 141 137  K 4.2 3.7   < > 3.7 4.0 3.5 3.8 3.9  CL 108 109  --  119* 115* 113* 112* 108  CO2 22 21*  --  18* 19* 20* 19* 20*  GLUCOSE 99 106*  --  112* 84 132* 167* 181*  BUN 10 10  --  14 16 21  25* 29*  CREATININE 1.04 0.93  --  0.91 0.93 0.84 0.88 0.83  CALCIUM  8.1* 8.0*  --  8.0* 8.3* 8.2* 8.6* 8.5*  MG 1.8 2.0  --   --  2.2  --   --   --   PHOS 3.1  --   --   --   --   --   --   --    < > = values in this interval not displayed.   GFR: Estimated Creatinine Clearance: 110.9 mL/min (by C-G formula based on SCr of 0.83 mg/dL). Liver Function Tests: Recent Labs  Lab 11/30/23 1424  AST 24  ALT 23  ALKPHOS 59  BILITOT 0.8  PROT 7.4  ALBUMIN  3.6   No results for input(s): LIPASE, AMYLASE in the last 168 hours. No results for input(s): AMMONIA in the last 168 hours. Coagulation Profile: Recent Labs  Lab 11/30/23 1424  INR 1.1   Cardiac Enzymes: No results for input(s): CKTOTAL, CKMB, CKMBINDEX, TROPONINI in the last 168 hours. BNP (last 3 results) No results for input(s): PROBNP in the last 8760 hours. HbA1C: No results for input(s): HGBA1C in the last 72 hours. CBG: Recent Labs  Lab 12/06/23 2033 12/07/23 0106 12/07/23 0320 12/07/23 0751 12/07/23 1134  GLUCAP 140* 132* 126* 170* 167*   Lipid Profile: No results for input(s):  CHOL, HDL, LDLCALC, TRIG, CHOLHDL, LDLDIRECT in the last 72 hours. Thyroid Function Tests: No results for input(s): TSH, T4TOTAL, FREET4, T3FREE, THYROIDAB in the last 72 hours. Anemia Panel: No results for input(s): VITAMINB12, FOLATE, FERRITIN, TIBC, IRON, RETICCTPCT in the last 72 hours. Sepsis Labs: Recent Labs  Lab 11/30/23 2018 12/01/23 0646  PROCALCITON <0.10 <0.10    Recent Results (from the past 240 hours)  SARS Coronavirus 2 by RT PCR (hospital order, performed in [facility] hospital lab) *cepheid single result test* Anterior Nasal Swab  Status: None   Collection Time: 11/30/23  3:21 PM   Specimen: Anterior Nasal Swab  Result Value Ref Range Status   SARS Coronavirus 2 by RT PCR NEGATIVE NEGATIVE Final    Comment: (NOTE) SARS-CoV-2 target nucleic acids are NOT DETECTED.  The SARS-CoV-2 RNA is generally detectable in upper and lower respiratory specimens during the acute phase of infection. The lowest concentration of SARS-CoV-2 viral copies this assay can detect is 250 copies / mL. A negative result does not preclude SARS-CoV-2 infection and should not be used as the sole basis for treatment or other patient management decisions.  A negative result may occur with improper specimen collection / handling, submission of specimen other than nasopharyngeal swab, presence of viral mutation(s) within the areas targeted by this assay, and inadequate number of viral copies (<250 copies / mL). A negative result must be combined with clinical observations, patient history, and epidemiological information.  Fact Sheet for Patients:   roadlaptop.co.za  Fact Sheet for Healthcare Providers: http://kim-miller.com/  This test is not yet approved or  cleared by the [place]  FDA and has been authorized for detection and/or diagnosis of SARS-CoV-2 by FDA under an Emergency Use Authorization (EUA).   This EUA will remain in effect (meaning this test can be used) for the duration of the COVID-19 declaration under Section 564(b)(1) of the Act, 21 U.S.C. section 360bbb-3(b)(1), unless the authorization is terminated or revoked sooner.  Performed at [facility], [address] [place], [place] [address]   MRSA Next Gen by PCR, Nasal     Status: None   Collection Time: 11/30/23  6:50 PM   Specimen: Nasal Mucosa; Nasal Swab  Result Value Ref Range Status   MRSA by PCR Next Gen NOT DETECTED NOT DETECTED Final    Comment: (NOTE) The GeneXpert MRSA Assay (FDA approved for NASAL specimens only), is one component of a comprehensive MRSA colonization surveillance program. It is not intended to diagnose MRSA infection nor to guide or monitor treatment for MRSA infections. Test performance is not FDA approved in patients less than 26 years old. Performed at [facility], [address], [place], [place] [address]   Respiratory (~20 pathogens) panel by PCR     Status: None   Collection Time: 12/06/23  6:24 PM   Specimen: Nasopharyngeal Swab; Respiratory  Result Value Ref Range Status   Adenovirus NOT DETECTED NOT DETECTED Final   Coronavirus 229E NOT DETECTED NOT DETECTED Final    Comment: (NOTE) The Coronavirus on the Respiratory Panel, DOES NOT test for the novel  Coronavirus (2019 nCoV)    Coronavirus HKU1 NOT DETECTED NOT DETECTED Final   Coronavirus NL63 NOT DETECTED NOT DETECTED Final   Coronavirus OC43 NOT DETECTED NOT DETECTED Final   Metapneumovirus NOT DETECTED NOT DETECTED Final   Rhinovirus / Enterovirus NOT DETECTED NOT DETECTED Final   Influenza A NOT DETECTED NOT DETECTED Final   Influenza B NOT DETECTED NOT DETECTED Final   Parainfluenza Virus 1 NOT DETECTED NOT DETECTED Final   Parainfluenza Virus 2 NOT DETECTED NOT DETECTED Final   Parainfluenza Virus 3 NOT DETECTED NOT DETECTED Final   Parainfluenza Virus 4 NOT DETECTED NOT DETECTED Final   Respiratory  Syncytial Virus NOT DETECTED NOT DETECTED Final   Bordetella pertussis NOT DETECTED NOT DETECTED Final   Bordetella Parapertussis NOT DETECTED NOT DETECTED Final   Chlamydophila pneumoniae NOT DETECTED NOT DETECTED Final   Mycoplasma pneumoniae NOT DETECTED NOT DETECTED Final    Comment: Performed  at [facility], [address] [place], [place] [address]         Radiology Studies: CT SOFT TISSUE NECK W CONTRAST Result Date: 12/07/2023 CLINICAL DATA:  Vocal cord paralysis EXAM: CT NECK WITH CONTRAST TECHNIQUE: Multidetector CT imaging of the neck was performed using the standard protocol following the bolus administration of intravenous contrast. RADIATION DOSE REDUCTION: This exam was performed according to the departmental dose-optimization program which includes automated exposure control, adjustment of the mA and/or kV according to patient size and/or use of iterative reconstruction technique. CONTRAST:  75mL OMNIPAQUE  IOHEXOL  350 MG/ML SOLN COMPARISON:  None Available. FINDINGS: Pharynx and larynx: Normal. No mass or swelling. Symmetric vocal cords. Salivary glands: No inflammation, mass, or stone. Thyroid: Normal. Lymph nodes: None enlarged or abnormal density. Vascular: Right ICA stent appears patent. Left carotid bifurcation atherosclerosis with left ICA narrowing, better characterized on prior CTA. Aortic atherosclerosis. Limited intracranial: Negative. Visualized orbits: Negative. Mastoids and visualized paranasal sinuses: Clear. Skeleton: No acute abnormality on limited assessment. Upper chest: Visualized lung apices are clear. IMPRESSION: Unremarkable CT of the neck. No visible mass or adenopathy. Symmetric vocal cords by CT. Electronically Signed   By: Gilmore GORMAN Molt M.D.   On: 12/07/2023 01:13        Scheduled Meds:  aspirin   81 mg Oral Daily   atorvastatin   20 mg Oral QPM   Chlorhexidine  Gluconate Cloth  6 each Topical Daily   docusate  100 mg Oral Daily   enoxaparin   (LOVENOX ) injection  40 mg Subcutaneous Q24H   feeding supplement (PROSource TF20)  60 mL Per Tube BID   free water   250 mL Per Tube Q6H   insulin  aspart  0-9 Units Subcutaneous Q6H   ipratropium-albuterol   3 mL Nebulization Q6H WA   levothyroxine   50 mcg Oral Daily   mouth rinse  15 mL Mouth Rinse 4 times per day   predniSONE   50 mg Oral Q breakfast   ticagrelor   90 mg Oral BID   Continuous Infusions:  feeding supplement (OSMOLITE 1.5 CAL) 1,000 mL (12/07/23 0721)     LOS: 7 days    Time spent: 35 minutes    Renato Applebaum, MD [facility]

## 2023-12-07 NOTE — Progress Notes (Signed)
 Speech Language Pathology Treatment: Dysphagia  Patient Details Name: Ian Wong MRN: [id number] DOB: 1961/04/07 Today's Date: 12/07/2023 Time: 8478-8466 SLP Time Calculation (min) (ACUTE ONLY): 12 min  Assessment / Plan / Recommendation Clinical Impression  Pt's voice seems a little stronger and his WOB is not as effortful as last SLP visits, but he still has audible wheezing. Pt consumed trials of purees and honey thick liquids from meal tray with Min-Mod cues given to initiate clearance with purees. Although some oral holding is noted, he does not leave much oral residue behind. There is occasional, delayed coughing noted that could be concerning for decreased airway protection, but pt says that he has been coughing like this throughout the day. Subjectively, he thinks his swallowing is improving, and MD is hoping for advancement to be able to make modification with TFs. Given aspiration (including some silent aspiration) on last MBS and ongoing clinical signs of dysphagia, would consider repeat MBS prior to making changes. This can be done on next date at the earliest.    HPI HPI: 63 yo male transfer from [facility] with L side weakness and R gaze deviation s/p TNK and thrombectomy 12/30. There was some concern for aspiration after vomiting episode during intubation so was kept on vent until 12/31. 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 Repeate CT 1/1 due to decreased wakefulness showed increasing mass effect on the R lateral ventricle.  PMH includes: GERD, hypothyroidism, seizures      SLP Plan  MBS      Recommendations for follow up therapy are one component of a multi-disciplinary discharge planning process, led by the attending physician.  Recommendations may be updated based on patient status, additional functional criteria and insurance authorization.    Recommendations  Diet recommendations: Dysphagia 1 (puree);Honey-thick liquid Liquids provided via:  Cup;Teaspoon Medication Administration: Crushed with puree Supervision: Staff to assist with self feeding;Full supervision/cueing for compensatory strategies Compensations: Minimize environmental distractions;Slow rate;Small sips/bites;Other (Comment) (one bite or sip at a time) Postural Changes and/or Swallow Maneuvers: Seated upright 90 degrees;Upright 30-60 min after meal                  Oral care BID   Frequent or constant Supervision/Assistance Dysphagia, oropharyngeal phase (R13.12)     MBS     Leita SAILOR., M.A. CCC-SLP Acute Rehabilitation Services Office [phone]  Secure chat preferred   12/07/2023, 3:38 PM

## 2023-12-07 NOTE — Progress Notes (Signed)
 Physical Therapy Treatment Patient Details Name: Ian Wong MRN: [id number] DOB: 01-21-1961 Today's Date: 12/07/2023   History of Present Illness 63 yo male transfer from [facility] with L side weakness and R gaze deviation CT (+) R MCA 12/31 thrombectomy  extubated 12/31 [facility] GERD hypothyroidism    PT Comments  Pt resting in bed on arrival and agreeable to session. Pt with steady progress towards acute goals this session with focus on functional transfers. Pt able to power up to stand with RW support with min A to steady on rise and step pivot x2 during session with mod A. Pt continues to be limited by L hemi-weakness and inattention, as well as bowel incontinence this session, limited gait progression. Current plan remains appropriate to address deficits and maximize functional independence and decrease caregiver burden. Pt continues to benefit from skilled PT services to progress toward functional mobility goals.      If plan is discharge home, recommend the following: A lot of help with bathing/dressing/bathroom;Assistance with cooking/housework;Assist for transportation;Help with stairs or ramp for entrance;Two people to help with walking and/or transfers;Direct supervision/assist for medications management;Direct supervision/assist for financial management   Can travel by private vehicle        Equipment Recommendations  Other (comment)    Recommendations for Other Services       Precautions / Restrictions Precautions Precautions: Fall Precaution Comments: L inattention with some improvement Restrictions Weight Bearing Restrictions Per Provider Order: No     Mobility  Bed Mobility Overal bed mobility: Needs Assistance Bed Mobility: Supine to Sit Rolling: Min assist         General bed mobility comments: min A to elevate trunk with increased time    Transfers Overall transfer level: Needs assistance Equipment used: Rolling walker (2 wheels) Transfers: Sit to/from Stand,  Bed to chair/wheelchair/BSC Sit to Stand: Min assist   Step pivot transfers: Mod assist       General transfer comment: min A to power up to stand from EOB x2 and BSC with RW for support, mod A to step pivot EOB>BSC>chair    Ambulation/Gait               General Gait Details: unable to progress due to bowel incontinence   Stairs             Wheelchair Mobility     Tilt Bed    Modified Rankin (Stroke Patients Only) Modified Rankin (Stroke Patients Only) Pre-Morbid Rankin Score: No symptoms Modified Rankin: Severe disability     Balance Overall balance assessment: Needs assistance Sitting-balance support: Single extremity supported, Feet supported Sitting balance-Leahy Scale: Poor Sitting balance - Comments: Pt able to find and hold midline for extend periods of time with UE's, but without assist, cuing only. Postural control: Left lateral lean Standing balance support: Single extremity supported, During functional activity Standing balance-Leahy Scale: Poor Standing balance comment: L lean, min A for standing balance with R UE support                            Cognition Arousal: Alert Behavior During Therapy: Flat affect Overall Cognitive Status: Impaired/Different from baseline (NT formally) Area of Impairment: Following commands, Attention, Memory, Safety/judgement, Awareness, Problem solving                   Current Attention Level: Sustained Memory: Decreased short-term memory Following Commands: Follows one step commands with increased time Safety/Judgement: Decreased awareness of deficits,  Decreased awareness of safety Awareness: Intellectual Problem Solving: Slow processing, Decreased initiation, Difficulty sequencing, Requires verbal cues, Requires tactile cues          Exercises      General Comments General comments (skin integrity, edema, etc.): VSS, pt wheezy breath sounds during session but VSS. pt coughing  without production a few times during session.      Pertinent Vitals/Pain Pain Assessment Pain Assessment: No/denies pain Pain Intervention(s): Monitored during session    Home Living                          Prior Function            PT Goals (current goals can now be found in the care plan section) Acute Rehab PT Goals Patient Stated Goal: to get back home PT Goal Formulation: With patient/family Time For Goal Achievement: 12/16/23 Progress towards PT goals: Progressing toward goals    Frequency    Min 4X/week      PT Plan      Co-evaluation              AM-PAC PT 6 Clicks Mobility   Outcome Measure  Help needed turning from your back to your side while in a flat bed without using bedrails?: A Lot Help needed moving from lying on your back to sitting on the side of a flat bed without using bedrails?: A Lot Help needed moving to and from a bed to a chair (including a wheelchair)?: A Lot Help needed standing up from a chair using your arms (e.g., wheelchair or bedside chair)?: A Lot Help needed to walk in hospital room?: Total Help needed climbing 3-5 steps with a railing? : Total 6 Click Score: 10    End of Session Equipment Utilized During Treatment: Gait belt Activity Tolerance: Patient tolerated treatment well;Other (comment) (limited by bowel incontinence) Patient left: with call bell/phone within reach;with chair alarm set;in chair Nurse Communication: Mobility status PT Visit Diagnosis: Other abnormalities of gait and mobility (R26.89);Other symptoms and signs involving the nervous system (R29.898) Hemiplegia - Right/Left: Left Hemiplegia - dominant/non-dominant: Non-dominant Hemiplegia - caused by: Other cerebrovascular disease     Time: 0908-0942 PT Time Calculation (min) (ACUTE ONLY): 34 min  Charges:    $Therapeutic Activity: 23-37 mins PT General Charges $$ ACUTE PT VISIT: 1 Visit                     Markiya Keefe R. PTA Acute  Rehabilitation Services Office: [phone]   Therisa CHRISTELLA Boor 12/07/2023, 2:04 PM

## 2023-12-07 NOTE — Plan of Care (Signed)
   Problem: Education: Goal: Knowledge of General Education information will improve Description: Including pain rating scale, medication(s)/side effects and non-pharmacologic comfort measures Outcome: Progressing   Problem: Clinical Measurements: Goal: Respiratory complications will improve Outcome: Progressing

## 2023-12-07 NOTE — Progress Notes (Signed)
 Nutrition Follow-up  DOCUMENTATION CODES:   Not applicable  INTERVENTION:  Continue tube feeding via  Cortrak: Osmolite 1.5  at 60 ml/h (1440 ml per day) Prosource TF20 60 ml BID 250 ml FWF Q6H   Provides 2320 kcal, 130 gm protein, 1097 ml free water  daily (2097 ml with FWF)-Rest of fluid needs by PO    Magic cup TID with meals, each supplement provides 290 kcal and 9 grams of protein - continues Change to room service with assist to promote PO intake with assistance - continues  NUTRITION DIAGNOSIS:  Predicted suboptimal nutrient intake related to acute illness as evidenced by other (comment) (dysphgia 1 diet with honecy thick lqiuids). - ongoing  GOAL:  Patient will meet greater than or equal to 90% of their needs - meeting/continues  MONITOR:  PO intake, TF tolerance, Supplement acceptance, Diet advancement, Labs  REASON FOR ASSESSMENT:  Consult Enteral/tube feeding initiation and management  ASSESSMENT:  Pt with hx of GERD and seizure disorder presented to ED with R gaze deviation and difficulty speaking. Imaging in ED suggestive of ischemic right MCA stroke.  12/30 - Intubated, TNKase  and mechanical thrombectomy with right carotid stenting and angioplasty, NPO  12/31 - Extubated, NPO 1/1 - showed increasing mass effect on the R lateral ventricle, 3% saline initiated, Foley removed  1/2 - Hypertonic saline discontinued 1/3 - dysphagia 1 diet, honey thick liquids, Cortrak placed  1/6 - episode of emesis due to using the suction too deep in his throat  Used the suction machine too deep this morning resulting in emesis. Some risk for aspiration. Continues on short course of prednisone  x3 days. Completed 5-day course of Unasyn  for suspected aspiration PNA.   He endorses no N/V related to TF. No abdominal distention or bowel-intolerance reported. Averaging 45% x2 meals documented. Encouraged high protein foods on his meal trays and in the form of supplements to preserve muscle  mass. He has been referred to CIR. D1, honey thick diet continues. Speech following.  Intake/Output Summary (Last 24 hours) at 12/07/2023 1341 Gross per 24 hour  Intake 2528 ml  Output 910 ml  Net 1618 ml    Labs: CBGs 84-181 mg/dL x 6 days  Meds: docusate, SSI, prednisone ,synthroid   Diet Order:   Diet Order             DIET - DYS 1 Room service appropriate? Yes with Assist; Fluid consistency: Honey Thick  Diet effective now             EDUCATION NEEDS:  Education needs have been addressed  Skin:  Skin Assessment: Reviewed RN Assessment Skin Integrity Issues:: Incisions Incisions: R thigh  Last BM:  01/05  Height:  Ht Readings from Last 1 Encounters:  11/30/23 5' 11 (1.803 m)    Weight:  Wt Readings from Last 1 Encounters:  11/30/23 96.8 kg    Ideal Body Weight:  78.18 kg  BMI:  Body mass index is 29.75 kg/m.  Estimated Nutritional Needs:   Kcal:  2200-2400 kcal  Protein:  115-135 gm  Fluid:  1 ml/kcal  Blair Deaner MS, RD, LDN Registered Dietitian Clinical Nutrition RD Inpatient Contact Info in Amion

## 2023-12-07 NOTE — Progress Notes (Addendum)
 STROKE TEAM PROGRESS NOTE   [name] is a 63 y.o. male with past medical history significant for seizures, hypothyroidism, GERD who presented to [name] ED 12/30 with persistent right gaze deviation and left hemiplegia.  His last known well was 1245 family did call EMS when his symptoms started.  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  Patient was given TNK at [facility] and was transferred to [facility] for emergent thrombectomy. On neurologic neurology exam on arrival to [facility] patient still had persistent left-sided weakness, right gaze, left-sided sensory deficit, left hemianopsia, left sensory neglect. He was then taken emergently to IR. NIH 18 on admit  SIGNIFICANT HOSPITAL EVENTS 12/30: Transferred from [name] for emergent thrombectomy             Mechanical thrombectomy, right carotid stent and angioplasty  --> TICI 3             Left intubated postprocedure             Post procedure CT showed no hemorrhage. 12/31: MRI shows extensive acute right MCA territory infarcts with chronic microhemorrhages versus acute petechial hemorrhages.  1/1: Remains in ICU. Ongoing Neo for BP support. SLP NPO except for meds. 1/2: Hypertonic saline discontinued 1/3: Coretrak placed  Interval history  Patient alert, following commands.  Dysarthria and left arm weakness continues   Neurological exam stable. Episode of vomiting today. Respiratory status stable after. Appreciate hospitalist assistance  Patient continues to have some exertional wheezing, continues on prednisone  and duo-nebs, will monitor.   Discharge plan for CIR, pending their full evaluation.   Vitals:   12/07/23 0752 12/07/23 0823 12/07/23 1102 12/07/23 1104  BP: (!) 159/68  (!) 144/74 (!) 144/74  Pulse: 72 72 76 81  Resp: 20 20 20 20   Temp: 97.8 F (36.6 C)  97.7 F (36.5 C) 97.7 F (36.5 C)  TempSrc: Oral  Oral Oral  SpO2: 96% 95% 96%  96%  Weight:      Height:       CBC:  Recent Labs  Lab 11/30/23 1424 11/30/23 1430 12/06/23 0613 12/07/23 0624  WBC 8.0   < > 9.5 10.1  NEUTROABS 4.5  --   --   --   HGB 16.4   < > 13.8 13.8  HCT 46.8   < > 38.6* 39.5  MCV 86.8   < > 85.8 87.2  PLT 222   < > 190 176   < > = values in this interval not displayed.   Basic Metabolic Panel:  Recent Labs  Lab 12/01/23 0646 12/02/23 0645 12/02/23 1512 12/04/23 0856 12/05/23 0441 12/06/23 0613 12/07/23 0624  NA 139 137   < > 142   < > 141 137  K 4.2 3.7   < > 4.0   < > 3.8 3.9  CL 108 109   < > 115*   < > 112* 108  CO2 22 21*   < > 19*   < > 19* 20*  GLUCOSE 99 106*   < > 84   < > 167* 181*  BUN 10 10   < > 16   < > 25* 29*  CREATININE 1.04 0.93   < > 0.93   < > 0.88 0.83  CALCIUM  8.1* 8.0*   < > 8.3*   < > 8.6* 8.5*  MG 1.8 2.0  --  2.2  --   --   --  PHOS 3.1  --   --   --   --   --   --    < > = values in this interval not displayed.   Lipid Panel:  Recent Labs  Lab 12/01/23 0647  CHOL 112  TRIG 146  HDL 26*  CHOLHDL 4.3  VLDL 29  LDLCALC 57   HgbA1c:  Recent Labs  Lab 11/30/23 2018  HGBA1C 5.9*   Urine Drug Screen:  Recent Labs  Lab 12/04/23 2355  LABOPIA NONE DETECTED  COCAINSCRNUR NONE DETECTED  LABBENZ NONE DETECTED  AMPHETMU NONE DETECTED  THCU NONE DETECTED  LABBARB NONE DETECTED    Alcohol Level  Recent Labs  Lab 11/30/23 1424  ETH <10    IMAGING past 24 hours CT SOFT TISSUE NECK W CONTRAST Result Date: 12/07/2023 CLINICAL DATA:  Vocal cord paralysis EXAM: CT NECK WITH CONTRAST TECHNIQUE: Multidetector CT imaging of the neck was performed using the standard protocol following the bolus administration of intravenous contrast. RADIATION DOSE REDUCTION: This exam was performed according to the departmental dose-optimization program which includes automated exposure control, adjustment of the mA and/or kV according to patient size and/or use of iterative reconstruction technique. CONTRAST:   75mL OMNIPAQUE  IOHEXOL  350 MG/ML SOLN COMPARISON:  None Available. FINDINGS: Pharynx and larynx: Normal. No mass or swelling. Symmetric vocal cords. Salivary glands: No inflammation, mass, or stone. Thyroid: Normal. Lymph nodes: None enlarged or abnormal density. Vascular: Right ICA stent appears patent. Left carotid bifurcation atherosclerosis with left ICA narrowing, better characterized on prior CTA. Aortic atherosclerosis. Limited intracranial: Negative. Visualized orbits: Negative. Mastoids and visualized paranasal sinuses: Clear. Skeleton: No acute abnormality on limited assessment. Upper chest: Visualized lung apices are clear. IMPRESSION: Unremarkable CT of the neck. No visible mass or adenopathy. Symmetric vocal cords by CT. Electronically Signed   By: Gilmore GORMAN Molt M.D.   On: 12/07/2023 01:13     PHYSICAL EXAM Temp:  [97.7 F (36.5 C)-98.9 F (37.2 C)] 97.7 F (36.5 C) (01/06 1104) Pulse Rate:  [72-91] 81 (01/06 1104) Resp:  [20-25] 20 (01/06 1104) BP: (122-163)/(66-76) 144/74 (01/06 1104) SpO2:  [93 %-97 %] 96 % (01/06 1104)  General - Well nourished, well developed middle-age Caucasian male, in no apparent distress. HEENT- atraumatic, normocephalic  Cardiovascular - Regular rhythm and rate. Pulmonary- equal chest rise; unlabored respirations. Bilateral rhonchi and continued wheezing.   Neuro: Mental Status: Patient is awake, alert, oriented to person, place, month, year, and situation.continues to follow all commands.  Patient is able to give a clear and coherent history with moderate dysarthria. No aphasia.  No signs of aphasia or neglect Cranial Nerves: II: Visual Fields are full. Pupils are equal, round, and reactive to light.   III,IV, VI: o gaze palsy, tracking examiner bilaterally  V: Facial sensation is symmetric to light touch CPP:Mphyu facial droop VIII: hearing is intact to voice X: Uvula elevates symmetrically XI: Shoulder shrug is symmetric. XII: tongue is  midline  Motor: Tone is normal. Bulk is normal.  RUE: 4/5, no drift LUE: slight movement seen, 2/5. Lateral movement greater than vertical. Continue soft brace.  BLE: No drift present antigravity strength.  Sensory: Sensation is symmetric to light touch in the arms and legs. Cerebellar: FNF intact on RUE, no ataxia seen. Gait:  Deferred   ASSESSMENT/PLAN Mr. Jed Kutch is a 63 y.o. male with history of GERD, Hypothyroid, and seizures presenting with acute stroke.   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  S/p IR with TICI3 and right CAS MRI: moderate acute right MCA territory infarcts, chronic microhemorrhages versus acute petechial hemorrhages seen along the right basal ganglia. Chronic lacunar infarct within the left thalamus CT repeat stable infarct  2D Echo: LVEF 50 to 55% LDL 57 HgbA1c 5.9 UDS neg VTE prophylaxis - lovenox  aspirin  81 mg daily prior to admission, continue aspirin  81 mg daily and Brilinta  (ticagrelor ) 90 mg bid for carotid stenting Therapy recommendations: CIR Disposition:  pending   Essential Hypertension Hypotension, resolved Home meds:  none Stable now SBP goal above 110 Long term BP goal normotensive   COPD exacebation Respiratory distress Exertional wheezing On prednisone  for 5 days and nebs PRN Completed unasyn  x 5 days course Still has exertional wheezing On 1-2L O2 Hospital service on board  Episode of vomiting 1/6 with gagging  Hyperlipidemia Home meds: Lipitor 20 mg, resumed in hospital LDL 57, goal < 70 Continue statin at discharge   Dysphagia Patient has post-stroke dysphagia, SLP consulted Now on Dysphagia  level 1 and honey thick liquids Coretrak placed 1/3 On TF @ 60 with FW 250 Q6   Other Stroke Risk Factors Former Cigarette Smoker   Other Active Problems Hypothyroidism Hx of remote seizure at young age, no seizures for a long time, no AED at home  Hospital day # 7  Patient seen and examined by NP/APP with MD. MD to update note as needed.   Jorene Last, DNP, FNP-BC [facility] Pager: [phone]  ATTENDING NOTE: I reviewed above note and agree with the assessment and plan. Pt was seen and examined.   No family at the bedside. Pt lying in bed, no respiratory distress at rest, with mild exertion pt started to have wheezing but seems improved from yesterday. Neuro stable, still has left UE paresis, on arm/hand brace. Appreciate hospitalist service assistance. Continue prednisone , DAPT and statin. Nebs PRN. Pending CIR.   For detailed assessment and plan, please refer to above/below as I have made changes wherever appropriate.   Ary Cummins, MD PhD Stroke Neurology 12/07/2023 2:35 PM

## 2023-12-07 NOTE — Progress Notes (Signed)
 SLP Cancellation Note  Patient Details Name: Ian Wong MRN: [id number] DOB: 05/02/1961   Cancelled treatment:       Reason Eval/Treat Not Completed: Medical issues which prohibited therapy. Note that pt just had an episode of vomiting. Will f/u as able to check in on swallowing.     Leita SAILOR., M.A. CCC-SLP Acute Rehabilitation Services Office [phone]  Secure chat preferred  12/07/2023, 1:04 PM

## 2023-12-07 NOTE — Plan of Care (Signed)
  Problem: Education: Goal: Knowledge of General Education information will improve Description: Including pain rating scale, medication(s)/side effects and non-pharmacologic comfort measures Outcome: Progressing   Problem: Health Behavior/Discharge Planning: Goal: Ability to manage health-related needs will improve Outcome: Progressing   Problem: Clinical Measurements: Goal: Ability to maintain clinical measurements within normal limits will improve Outcome: Progressing Goal: Will remain free from infection Outcome: Progressing Goal: Diagnostic test results will improve Outcome: Progressing Goal: Respiratory complications will improve Outcome: Progressing Goal: Cardiovascular complication will be avoided Outcome: Progressing   Problem: Activity: Goal: Risk for activity intolerance will decrease Outcome: Progressing   Problem: Nutrition: Goal: Adequate nutrition will be maintained Outcome: Progressing   Problem: Coping: Goal: Level of anxiety will decrease Outcome: Progressing   Problem: Elimination: Goal: Will not experience complications related to bowel motility Outcome: Progressing Goal: Will not experience complications related to urinary retention Outcome: Progressing   Problem: Pain Management: Goal: General experience of comfort will improve Outcome: Progressing   Problem: Safety: Goal: Ability to remain free from injury will improve Outcome: Progressing   Problem: Skin Integrity: Goal: Risk for impaired skin integrity will decrease Outcome: Progressing   Problem: Education: Goal: Knowledge of disease or condition will improve Outcome: Progressing Goal: Knowledge of secondary prevention will improve (MUST DOCUMENT ALL) Outcome: Progressing Goal: Knowledge of patient specific risk factors will improve Alonso N/A or DELETE if not current risk factor) Outcome: Progressing   Problem: Ischemic Stroke/TIA Tissue Perfusion: Goal: Complications of ischemic  stroke/TIA will be minimized Outcome: Progressing   Problem: Coping: Goal: Will verbalize positive feelings about self Outcome: Progressing Goal: Will identify appropriate support needs Outcome: Progressing   Problem: Health Behavior/Discharge Planning: Goal: Ability to manage health-related needs will improve Outcome: Progressing Goal: Goals will be collaboratively established with patient/family Outcome: Progressing   Problem: Self-Care: Goal: Ability to participate in self-care as condition permits will improve Outcome: Progressing Goal: Verbalization of feelings and concerns over difficulty with self-care will improve Outcome: Progressing Goal: Ability to communicate needs accurately will improve Outcome: Progressing   Problem: Nutrition: Goal: Risk of aspiration will decrease Outcome: Progressing Goal: Dietary intake will improve Outcome: Progressing   Problem: Education: Goal: Understanding of CV disease, CV risk reduction, and recovery process will improve Outcome: Progressing Goal: Individualized Educational Video(s) Outcome: Progressing   Problem: Activity: Goal: Ability to return to baseline activity level will improve Outcome: Progressing   Problem: Cardiovascular: Goal: Ability to achieve and maintain adequate cardiovascular perfusion will improve Outcome: Progressing Goal: Vascular access site(s) Level 0-1 will be maintained Outcome: Progressing   Problem: Health Behavior/Discharge Planning: Goal: Ability to safely manage health-related needs after discharge will improve Outcome: Progressing   Problem: Activity: Goal: Ability to tolerate increased activity will improve Outcome: Progressing   Problem: Respiratory: Goal: Ability to maintain a clear airway and adequate ventilation will improve Outcome: Progressing   Problem: Role Relationship: Goal: Method of communication will improve Outcome: Progressing   Problem: Safety: Goal: Non-violent  Restraint(s) Outcome: Progressing

## 2023-12-08 ENCOUNTER — Inpatient Hospital Stay (HOSPITAL_COMMUNITY): Payer: Medicaid Other

## 2023-12-08 LAB — CBC
HCT: 40.2 % (ref 39.0–52.0)
Hemoglobin: 14.1 g/dL (ref 13.0–17.0)
MCH: 30.9 pg (ref 26.0–34.0)
MCHC: 35.1 g/dL (ref 30.0–36.0)
MCV: 88.2 fL (ref 80.0–100.0)
Platelets: 164 10*3/uL (ref 150–400)
RBC: 4.56 MIL/uL (ref 4.22–5.81)
RDW: 13.4 % (ref 11.5–15.5)
WBC: 10.9 10*3/uL — ABNORMAL HIGH (ref 4.0–10.5)
nRBC: 0 % (ref 0.0–0.2)

## 2023-12-08 LAB — GLUCOSE, CAPILLARY
Glucose-Capillary: 106 mg/dL — ABNORMAL HIGH (ref 70–99)
Glucose-Capillary: 140 mg/dL — ABNORMAL HIGH (ref 70–99)
Glucose-Capillary: 149 mg/dL — ABNORMAL HIGH (ref 70–99)
Glucose-Capillary: 153 mg/dL — ABNORMAL HIGH (ref 70–99)

## 2023-12-08 LAB — BASIC METABOLIC PANEL
Anion gap: 9 (ref 5–15)
BUN: 29 mg/dL — ABNORMAL HIGH (ref 8–23)
CO2: 22 mmol/L (ref 22–32)
Calcium: 8.4 mg/dL — ABNORMAL LOW (ref 8.9–10.3)
Chloride: 107 mmol/L (ref 98–111)
Creatinine, Ser: 0.86 mg/dL (ref 0.61–1.24)
GFR, Estimated: >60 mL/min (ref >60)
Glucose, Bld: 114 mg/dL — ABNORMAL HIGH (ref 70–99)
Potassium: 4.2 mmol/L (ref 3.5–5.1)
Sodium: 138 mmol/L (ref 135–145)

## 2023-12-08 MED ORDER — ENSURE ENLIVE PO LIQD
237.0000 mL | Freq: Three times a day (TID) | ORAL | Status: DC
  Administered 2023-12-08 – 2023-12-11 (×8): 237 mL via ORAL

## 2023-12-08 MED ORDER — OSMOLITE 1.5 CAL PO LIQD
1000.0000 mL | ORAL | Status: DC
  Administered 2023-12-08: 1000 mL

## 2023-12-08 NOTE — Progress Notes (Signed)
 Occupational Therapy Treatment Patient Details Name: Ian Wong MRN: [id number] DOB: August 31, 1961 Today's Date: 12/08/2023   History of present illness 63 yo male transfer from [facility] with L side weakness and R gaze deviation CT (+) R MCA 12/31 thrombectomy  extubated 12/31 [facility]H GERD hypothyroidism   OT comments  Pt making steady progress towards OT goals this session. Pt continues to present with LUE hemiparesis. Session focus on various therapeutic activities focused on improving LUE AROM for ADL participation. See therex below. Pt currently requires MIN A- CGA for ADL transfer with RW. Pt would continue to benefit from skilled occupational therapy while admitted and after d/c to address the below listed limitations in order to improve overall functional mobility and facilitate independence with BADL participation. DC plan remains appropriate, will follow acutely per POC.         If plan is discharge home, recommend the following:  Two people to help with walking and/or transfers;Two people to help with bathing/dressing/bathroom   Equipment Recommendations  BSC/3in1;Wheelchair (measurements OT);Wheelchair cushion (measurements OT);Hospital bed;Hoyer lift    Recommendations for Other Services      Precautions / Restrictions Precautions Precautions: Fall Precaution Comments: L inattention with some improvement Restrictions Weight Bearing Restrictions Per Provider Order: No       Mobility Bed Mobility Overal bed mobility: Needs Assistance Bed Mobility: Sit to Supine       Sit to supine: Supervision, Used rails   General bed mobility comments: cues for sequencing    Transfers Overall transfer level: Needs assistance Equipment used: Rolling walker (2 wheels) Transfers: Sit to/from Stand, Bed to chair/wheelchair/BSC Sit to Stand: Min assist Stand pivot transfers: Contact guard assist         General transfer comment: MIN A to rise from Recliner with RW, pt able to  pivot back to bed with Rw and CGA, assisted needed to keep L hand on RW     Balance Overall balance assessment: Needs assistance Sitting-balance support: Single extremity supported, Feet supported Sitting balance-Leahy Scale: Fair     Standing balance support: Bilateral upper extremity supported, During functional activity Standing balance-Leahy Scale: Poor                             ADL either performed or assessed with clinical judgement   ADL                           Toilet Transfer: Minimal assistance;Ambulation;Rolling walker (2 wheels) Toilet Transfer Details (indicate cue type and reason): simulated recliner>bed           General ADL Comments: ADL participation impacted by LUE hemiparesis    Extremity/Trunk Assessment Upper Extremity Assessment Upper Extremity Assessment: LUE deficits/detail LUE Deficits / Details: pt able to complete Active shouler flexion to ~ 50*, able to demo uncoordinated hand/mouth pattern, ataxia noted during functioanl reach, gross grasp noted at digits, active movement noted in wrist LUE Sensation: decreased proprioception;decreased light touch LUE Coordination: decreased fine motor;decreased gross motor   Lower Extremity Assessment Lower Extremity Assessment: Generalized weakness        Vision Baseline Vision/History: 1 Wears glasses Patient Visual Report: No change from baseline     Perception Perception Perception: Impaired Preception Impairment Details: Inattention/Neglect Perception-Other Comments: L inattention, however improved from eval   Praxis Praxis Praxis: Impaired Praxis Impairment Details: Motor planning    Cognition Arousal: Alert Behavior During Therapy: Flat affect  Overall Cognitive Status: Impaired/Different from baseline Area of Impairment: Attention, Safety/judgement, Awareness, Problem solving                   Current Attention Level: Sustained     Safety/Judgement:  Decreased awareness of safety, Decreased awareness of deficits Awareness: Intellectual Problem Solving: Slow processing, Decreased initiation, Difficulty sequencing, Requires verbal cues General Comments: MAX cues needed to sequence therex, decreased insight into safety attempting to stand with one leg off recliner        Exercises Other Exercises Other Exercises: hand to hip active assist ROM from EOB with an emphasis on scapular protraction/retraction x10 reps Other Exercises: digit flexion/extension with pt squeezing compliant cube x10 reps Other Exercises: self ROM of LUE via shoulder flexion to 90* only    Shoulder Instructions       General Comments washed cover to pts resting hand splint and donned new cover, clean cover hanging in bathroom to dry    Pertinent Vitals/ Pain       Pain Assessment Pain Assessment: Faces Faces Pain Scale: Hurts a little bit Pain Location: butt from sitting in recliner Pain Descriptors / Indicators: Sore Pain Intervention(s): Repositioned  Home Living                                          Prior Functioning/Environment              Frequency  Min 1X/week        Progress Toward Goals  OT Goals(current goals can now be found in the care plan section)  Progress towards OT goals: Progressing toward goals  Acute Rehab OT Goals Patient Stated Goal: to get better OT Goal Formulation: With patient Time For Goal Achievement: 12/16/23 Potential to Achieve Goals: Good  Plan      Co-evaluation                 AM-PAC OT 6 Clicks Daily Activity     Outcome Measure   Help from another person eating meals?: A Little Help from another person taking care of personal grooming?: A Little Help from another person toileting, which includes using toliet, bedpan, or urinal?: A Little Help from another person bathing (including washing, rinsing, drying)?: A Lot Help from another person to put on and taking off  regular upper body clothing?: A Little Help from another person to put on and taking off regular lower body clothing?: A Lot 6 Click Score: 16    End of Session Equipment Utilized During Treatment: Gait belt;Rolling walker (2 wheels)  OT Visit Diagnosis: Unsteadiness on feet (R26.81);Muscle weakness (generalized) (M62.81);Hemiplegia and hemiparesis Hemiplegia - Right/Left: Left Hemiplegia - dominant/non-dominant: Non-Dominant Hemiplegia - caused by: Cerebral infarction   Activity Tolerance Patient tolerated treatment well   Patient Left in bed;with call bell/phone within reach;with bed alarm set   Nurse Communication Mobility status;Other (comment) (via secure chat)        Time: 8542-8483 OT Time Calculation (min): 19 min  Charges: OT General Charges $OT Visit: 1 Visit OT Treatments $Therapeutic Activity: 8-22 mins  Ronal Mallie POUR., COTA/L Acute Rehabilitation Services [phone]   Ronal Mallie Needy 12/08/2023, 3:43 PM

## 2023-12-08 NOTE — Plan of Care (Signed)

## 2023-12-08 NOTE — Procedures (Signed)
 Modified Barium Swallow Study  Patient Details  Name: Ian Wong MRN: [id number] Date of Birth: Feb 28, 1961  Today's Date: 12/08/2023  Modified Barium Swallow completed.  Full report located under Chart Review in the Imaging Section.  History of Present Illness 63 yo male transfer from [facility] with L side weakness and R gaze deviation s/p TNK and thrombectomy 12/30. There was some concern for aspiration after vomiting episode during intubation so was kept on vent until 12/31. 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 Repeate CT 1/1 due to decreased wakefulness showed increasing mass effect on the R lateral ventricle.  PMH includes: GERD, hypothyroidism, seizures. MBS completed on 12/04/23 recommending Dys 1 honey thick liquids. Repeat MBS to determine readiness to advance.   Clinical Impression Patient exhibits improvement in swallow function as compared to previous MBS on 12/04/23. During today's MBS, he continues to exhibit oral transit delays with solids, prolonged mastication with mechanical soft solids but full clearance of oral cavity s/p initial swallows. During pharyngeal phase, swallow initiated at level of vallecular sinus for honey thick liquids, puree solids, mechanical soft solids and delayed to level of pyriform sinus with thin and nectar thick liquids. Epiglottic inversion and laryngeal vestibular closure appear to have improved. Penetration observed with thin liquids which occured at level of vocal cords (PAS 5) which did not eject out as well as silent aspiration of thin liquids (PAS 8) just below vocal cords. No penetration or aspiration observed with any other consistency. No  significant pharyngeal residuals observed and no retrograde movement of barium. Patient exhibited a few hiccups but no barium visualized when fluoroscopy turned on. SLP recommending advance liquids to nectar thick and continue with puree solids. Of note, patient reports he does not have much of an  appetite and his RN confirmed this. Factors that may increase risk of adverse event in presence of aspiration Noe & Lianne 2021): Respiratory or GI disease;Reduced cognitive function;Limited mobility;Frail or deconditioned;Dependence for feeding and/or oral hygiene;Reduced saliva;Weak cough;Presence of tubes (ETT, trach, NG, etc.)  Swallow Evaluation Recommendations Recommendations: PO diet PO Diet Recommendation: Dysphagia 1 (Pureed);Mildly thick liquids (Level 2, nectar thick) Liquid Administration via: Cup;Spoon;Straw Medication Administration: Crushed with puree Supervision: Staff to assist with self-feeding;Full supervision/cueing for swallowing strategies Swallowing strategies  : Minimize environmental distractions;Slow rate;Small bites/sips Postural changes: Position pt fully upright for meals;Stay upright 30-60 min after meals Oral care recommendations: Oral care BID (2x/day);Staff/trained caregiver to provide oral care Caregiver Recommendations: Avoid jello, ice cream, thin soups, popsicles;Remove water  pitcher;Have oral suction available      Norleen IVAR Blase, MA, CCC-SLP Speech Therapy

## 2023-12-08 NOTE — Plan of Care (Signed)
  Problem: Ischemic Stroke/TIA Tissue Perfusion: Goal: Complications of ischemic stroke/TIA will be minimized Outcome: Progressing   Problem: Self-Care: Goal: Ability to participate in self-care as condition permits will improve Outcome: Progressing   Problem: Education: Goal: Knowledge of disease or condition will improve Outcome: Progressing

## 2023-12-08 NOTE — Progress Notes (Signed)
 Brief Nutrition Support Note  Contacted by RN and MD that pt with poor PO appetite. Added nutrition supplements and adjusted TF to give pt a break from TF for a few hours during the day. New regimen is as follows:  Tube feeding via cortrak: Osmolite 1.5 at 85 ml/h x 16 hours (1360 ml per day) from 6PM-10AM Prosource TF20 60 ml BID Provides 2040 kcal, 125 gm protein, 1036 ml free water  daily   Vernell Lukes, RD, LDN Registered Dietitian II Please reach out via secure chat Weekend on-call pager # available in [facility]

## 2023-12-08 NOTE — Progress Notes (Signed)
 Inpatient Rehab Admissions Coordinator:   CIR following, but Pt. Needs to improve enough during acute stay that he could reasonably reach supervision level during stay on CIR. He may ultimately need SNF if unable to progress, but that may be hard since he does not have insurance.   Leita Kleine, MS, CCC-SLP Rehab Admissions Coordinator  [phone] (celll) [phone] (office)

## 2023-12-08 NOTE — Plan of Care (Signed)
 Problem: Education: Goal: Knowledge of General Education information will improve Description: Including pain rating scale, medication(s)/side effects and non-pharmacologic comfort measures Outcome: Progressing   Problem: Health Behavior/Discharge Planning: Goal: Ability to manage health-related needs will improve Outcome: Progressing   Problem: Clinical Measurements: Goal: Ability to maintain clinical measurements within normal limits will improve Outcome: Progressing Goal: Will remain free from infection Outcome: Progressing Goal: Diagnostic test results will improve Outcome: Progressing Goal: Respiratory complications will improve Outcome: Progressing Goal: Cardiovascular complication will be avoided Outcome: Progressing   Problem: Activity: Goal: Risk for activity intolerance will decrease Outcome: Progressing   Problem: Nutrition: Goal: Adequate nutrition will be maintained Outcome: Progressing   Problem: Coping: Goal: Level of anxiety will decrease Outcome: Progressing   Problem: Elimination: Goal: Will not experience complications related to bowel motility Outcome: Progressing Goal: Will not experience complications related to urinary retention Outcome: Progressing   Problem: Pain Management: Goal: General experience of comfort will improve Outcome: Progressing   Problem: Safety: Goal: Ability to remain free from injury will improve Outcome: Progressing   Problem: Skin Integrity: Goal: Risk for impaired skin integrity will decrease Outcome: Progressing   Problem: Education: Goal: Knowledge of disease or condition will improve Outcome: Progressing Goal: Knowledge of secondary prevention will improve (MUST DOCUMENT ALL) Outcome: Progressing Goal: Knowledge of patient specific risk factors will improve Alonso N/A or DELETE if not current risk factor) Outcome: Progressing   Problem: Ischemic Stroke/TIA Tissue Perfusion: Goal: Complications of ischemic  stroke/TIA will be minimized Outcome: Progressing   Problem: Coping: Goal: Will verbalize positive feelings about self Outcome: Progressing Goal: Will identify appropriate support needs Outcome: Progressing   Problem: Health Behavior/Discharge Planning: Goal: Ability to manage health-related needs will improve Outcome: Progressing Goal: Goals will be collaboratively established with patient/family Outcome: Progressing   Problem: Self-Care: Goal: Ability to participate in self-care as condition permits will improve Outcome: Progressing Goal: Verbalization of feelings and concerns over difficulty with self-care will improve Outcome: Progressing Goal: Ability to communicate needs accurately will improve Outcome: Progressing   Problem: Nutrition: Goal: Risk of aspiration will decrease Outcome: Progressing Goal: Dietary intake will improve Outcome: Progressing   Problem: Education: Goal: Understanding of CV disease, CV risk reduction, and recovery process will improve Outcome: Progressing Goal: Individualized Educational Video(s) Outcome: Progressing   Problem: Activity: Goal: Ability to return to baseline activity level will improve Outcome: Progressing   Problem: Cardiovascular: Goal: Ability to achieve and maintain adequate cardiovascular perfusion will improve Outcome: Progressing Goal: Vascular access site(s) Level 0-1 will be maintained Outcome: Progressing   Problem: Health Behavior/Discharge Planning: Goal: Ability to safely manage health-related needs after discharge will improve Outcome: Progressing   Problem: Activity: Goal: Ability to tolerate increased activity will improve Outcome: Progressing   Problem: Respiratory: Goal: Ability to maintain a clear airway and adequate ventilation will improve Outcome: Progressing   Problem: Role Relationship: Goal: Method of communication will improve Outcome: Progressing   Problem: Safety: Goal: Non-violent  Restraint(s) Outcome: Progressing   Problem: Education: Goal: Ability to describe self-care measures that may prevent or decrease complications (Diabetes Survival Skills Education) will improve Outcome: Progressing Goal: Individualized Educational Video(s) Outcome: Progressing   Problem: Coping: Goal: Ability to adjust to condition or change in health will improve Outcome: Progressing   Problem: Fluid Volume: Goal: Ability to maintain a balanced intake and output will improve Outcome: Progressing   Problem: Health Behavior/Discharge Planning: Goal: Ability to identify and utilize available resources and services will improve Outcome: Progressing Goal: Ability to manage health-related  needs will improve Outcome: Progressing   Problem: Metabolic: Goal: Ability to maintain appropriate glucose levels will improve Outcome: Progressing   Problem: Nutritional: Goal: Maintenance of adequate nutrition will improve Outcome: Progressing Goal: Progress toward achieving an optimal weight will improve Outcome: Progressing

## 2023-12-08 NOTE — Progress Notes (Addendum)
 STROKE TEAM PROGRESS NOTE   Ian Wong is a 63 y.o. male with past medical history significant for seizures, hypothyroidism, GERD who presented to [name] ED 12/30 with persistent right gaze deviation and left hemiplegia.  His last known well was 1245 family did call EMS when his symptoms started.  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  Patient was given TNK at [facility] and was transferred to [facility] for emergent thrombectomy. On neurologic neurology exam on arrival to [facility] patient still had persistent left-sided weakness, right gaze, left-sided sensory deficit, left hemianopsia, left sensory neglect. He was then taken emergently to IR. NIH 18 on admit  SIGNIFICANT HOSPITAL EVENTS 12/30: Transferred from [name] to [facility] for emergent thrombectomy             Mechanical thrombectomy, right carotid stent and angioplasty  --> TICI 3             Left intubated postprocedure             Post procedure CT showed no hemorrhage. 12/31: MRI shows extensive acute right MCA territory infarcts with chronic microhemorrhages versus acute petechial hemorrhages.  1/1: Remains in ICU. Ongoing Neo for BP support. SLP NPO except for meds. 1/2: Hypertonic saline discontinued 1/3: Coretrak placed  Interval history  Patient alert, following commands.  Dysarthria and left arm weakness continues   Neurological exam stable. Respiratory status stable after. Appreciate hospitalist assistance  Wheezing much improved today, MBS completed, diet advanced  Discharge plan for CIR, pending their full evaluation.   Vitals:   12/08/23 0225 12/08/23 0320 12/08/23 0755 12/08/23 1144  BP:  (!) 140/57 99/82 (!) 155/88  Pulse:  69 75 73  Resp:  16 18 20   Temp:  98.1 F (36.7 C) 98.6 F (37 C) 98.9 F (37.2 C)  TempSrc:  Oral Oral Oral  SpO2: 92% 93% 93% 100%  Weight:      Height:       CBC:  Recent Labs  Lab 12/07/23 0624  12/08/23 0527  WBC 10.1 10.9*  HGB 13.8 14.1  HCT 39.5 40.2  MCV 87.2 88.2  PLT 176 164   Basic Metabolic Panel:  Recent Labs  Lab 12/02/23 0645 12/02/23 1512 12/04/23 0856 12/05/23 0441 12/07/23 0624 12/08/23 0527  NA 137   < > 142   < > 137 138  K 3.7   < > 4.0   < > 3.9 4.2  CL 109   < > 115*   < > 108 107  CO2 21*   < > 19*   < > 20* 22  GLUCOSE 106*   < > 84   < > 181* 114*  BUN 10   < > 16   < > 29* 29*  CREATININE 0.93   < > 0.93   < > 0.83 0.86  CALCIUM  8.0*   < > 8.3*   < > 8.5* 8.4*  MG 2.0  --  2.2  --   --   --    < > = values in this interval not displayed.   Lipid Panel:  No results for input(s): CHOL, TRIG, HDL, CHOLHDL, VLDL, LDLCALC in the last 168 hours.  HgbA1c:  No results for input(s): HGBA1C in the last 168 hours.  Urine Drug Screen:  Recent Labs  Lab 12/04/23 2355  LABOPIA NONE DETECTED  COCAINSCRNUR NONE DETECTED  LABBENZ NONE DETECTED  AMPHETMU NONE DETECTED  THCU NONE DETECTED  LABBARB NONE DETECTED    Alcohol Level  No results for input(s): ETH in the last 168 hours.   IMAGING past 24 hours No results found.    PHYSICAL EXAM Temp:  [98.1 F (36.7 C)-98.9 F (37.2 C)] 98.9 F (37.2 C) (01/07 1144) Pulse Rate:  [69-92] 73 (01/07 1144) Resp:  [16-22] 20 (01/07 1144) BP: (99-155)/(47-88) 155/88 (01/07 1144) SpO2:  [92 %-100 %] 100 % (01/07 1144)  General - Well nourished, well developed middle-age Caucasian male, in no apparent distress. HEENT- atraumatic, normocephalic  Cardiovascular - Regular rhythm and rate. Pulmonary- equal chest rise; unlabored respirations. Bilateral rhonchi and continued wheezing.   Neuro: Mental Status: Patient is awake, alert, oriented to person, place, month, year, and situation.continues to follow all commands.  Patient is able to give a clear and coherent history with moderate dysarthria. No aphasia.  No signs of aphasia or neglect Cranial Nerves: II: Visual Fields are full.  Pupils are equal, round, and reactive to light.   III,IV, VI: o gaze palsy, tracking examiner bilaterally  V: Facial sensation is symmetric to light touch CPP:Mphyu facial droop VIII: hearing is intact to voice X: Uvula elevates symmetrically XI: Shoulder shrug is symmetric. XII: tongue is midline  Motor: Tone is normal. Bulk is normal.  RUE: 4/5, no drift LUE: Increased movement seen, 3/5. Lateral movement greater than vertical. Continue soft brace.  BLE: No drift present antigravity strength.  Sensory: Sensation is symmetric to light touch in the arms and legs. Cerebellar: FNF intact on RUE, no ataxia seen. Gait:  Deferred   ASSESSMENT/PLAN [name] is a 63 y.o. male with history of GERD, Hypothyroid, and seizures presenting with acute stroke.   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  S/p IR with TICI3 and right CAS MRI: moderate acute right MCA territory infarcts, chronic microhemorrhages versus acute petechial hemorrhages seen along the right basal ganglia. Chronic lacunar infarct within the left thalamus CT repeat stable infarct  2D Echo: LVEF 50 to 55% LDL 57 HgbA1c 5.9 UDS neg VTE prophylaxis - lovenox  aspirin  81 mg daily prior to admission, continue aspirin  81 mg daily and Brilinta  (ticagrelor ) 90 mg bid for carotid stenting Therapy recommendations: CIR vs. SNF Disposition:  pending   Essential Hypertension Hypotension, resolved Home meds:  none Stable now SBP goal above 110 Long term BP goal normotensive   COPD exacebation Respiratory distress Exertional wheezing, improving On prednisone  for 5 days and nebs PRN Completed unasyn  x  5 days course Improving exertional wheezing On 1-2L O2 Hospital service on board  Episode of vomiting 1/6 with gagging  Hyperlipidemia Home meds: Lipitor 20 mg, resumed in hospital LDL 57, goal < 70 Continue statin at discharge   Dysphagia Patient has post-stroke dysphagia, SLP consulted Now on Dysphagia level 1 and honey thick -> nectar thick liquids Decreased appetite and put on ensures Change TF to nocturnal feeding to boost po intake Coretrak placed 1/3   Other Stroke Risk Factors Former Cigarette Smoker   Other Active Problems Hypothyroidism Hx of remote seizure at young age, no seizures for a long time, no AED at home  Hospital day # 8  Patient seen and examined by NP/APP with MD. MD to update note as needed.   Jorene Last, DNP, FNP-BC [facility] Pager: [phone]  ATTENDING NOTE: I reviewed above note and agree with the assessment and plan. Pt was seen and examined.   No family is at the bedside. Pt lying in bed, AAO x 3, exertional wheezing improved. LUE strength is also improving. RN reported zero appetite. His diet now advanced to dys1 with nectar thick liquid. Will change TF to nocturnal tube feeding, and add ensure. Encourage po intake. Appreciate hospitalist service assistance. Pending CIR vs. SNF.    For detailed assessment and plan, please refer to above/below as I have made changes wherever appropriate.   Ary Cummins, MD PhD Stroke Neurology 12/08/2023 5:18 PM

## 2023-12-08 NOTE — Progress Notes (Signed)
 Physical Therapy Treatment Patient Details Name: Ian Wong MRN: [id number] DOB: 1961/09/12 Today's Date: 12/08/2023   History of Present Illness 63 yo male transfer from [facility] with L side weakness and R gaze deviation CT (+) R MCA 12/31 thrombectomy  extubated 12/31 [facility] GERD hypothyroidism    PT Comments  Pt resting in bed on arrival and agreeable to session with good progress towards acute goals.  Pt able to come to sitting up EOB with CGA and increased time with cues for sequencing and bed rail use. Pt demonstrating transfers with min A to boost and manage L hand to RW and grossly min A for in room ambulation with RW for support and cues to attend to L environment. Pt able to maintain standing at sink without UE support for ADLs. Pt up in chair at end of session. Current plan remains appropriate to address deficits and maximize functional independence and decrease caregiver burden. Pt continues to benefit from skilled PT services to progress toward functional mobility goals.     If plan is discharge home, recommend the following: A lot of help with bathing/dressing/bathroom;Assistance with cooking/housework;Assist for transportation;Help with stairs or ramp for entrance;Two people to help with walking and/or transfers;Direct supervision/assist for medications management;Direct supervision/assist for financial management   Can travel by private vehicle        Equipment Recommendations  Other (comment)    Recommendations for Other Services       Precautions / Restrictions Precautions Precautions: Fall Precaution Comments: L inattention with some improvement Restrictions Weight Bearing Restrictions Per Provider Order: No     Mobility  Bed Mobility Overal bed mobility: Needs Assistance Bed Mobility: Supine to Sit     Supine to sit: Contact guard, HOB elevated, Used rails     General bed mobility comments: cues for sequencing and increased time to complete     Transfers Overall transfer level: Needs assistance Equipment used: Rolling walker (2 wheels) Transfers: Sit to/from Stand, Bed to chair/wheelchair/BSC Sit to Stand: Min assist           General transfer comment: light min A to rise from EOB and low commode    Ambulation/Gait Ambulation/Gait assistance: Mod assist, Min assist Gait Distance (Feet): 18 Feet (x2) Assistive device: Rolling walker (2 wheels) Gait Pattern/deviations: Step-to pattern, Decreased step length - right, Decreased step length - left, Decreased stride length, Decreased weight shift to left, Decreased weight shift to right, Trunk flexed, Knee flexed in stance - left Gait velocity: reduced     General Gait Details: asssit to keep L hand on RW and attend to L environment as pt bumping obstacles and with poor correction of path. pt ambulating from EOB>sink for ADLS>bathroom and then back to recliner   Stairs             Wheelchair Mobility     Tilt Bed    Modified Rankin (Stroke Patients Only) Modified Rankin (Stroke Patients Only) Pre-Morbid Rankin Score: No symptoms Modified Rankin: Severe disability     Balance Overall balance assessment: Needs assistance Sitting-balance support: Single extremity supported, Feet supported Sitting balance-Leahy Scale: Fair Sitting balance - Comments: Pt able to find and hold midline for extend periods of time with UE's, but without assist, cuing only. Postural control: Left lateral lean Standing balance support: Bilateral upper extremity supported, During functional activity Standing balance-Leahy Scale: Poor Standing balance comment: min A for standing balance with R UE support  Cognition Arousal: Alert Behavior During Therapy: Flat affect Overall Cognitive Status: Impaired/Different from baseline Area of Impairment: Attention, Safety/judgement, Awareness, Problem solving                   Current Attention  Level: Sustained     Safety/Judgement: Decreased awareness of safety, Decreased awareness of deficits Awareness: Intellectual Problem Solving: Slow processing, Decreased initiation, Difficulty sequencing, Requires verbal cues          Exercises      General Comments General comments (skin integrity, edema, etc.): washed cover to pts resting hand splint and donned new cover, clean cover hanging in bathroom to dry      Pertinent Vitals/Pain Pain Assessment Pain Assessment: No/denies pain Pain Intervention(s): Monitored during session    Home Living                          Prior Function            PT Goals (current goals can now be found in the care plan section) Acute Rehab PT Goals PT Goal Formulation: With patient/family Time For Goal Achievement: 12/16/23 Progress towards PT goals: Progressing toward goals    Frequency    Min 4X/week      PT Plan      Co-evaluation              AM-PAC PT 6 Clicks Mobility   Outcome Measure  Help needed turning from your back to your side while in a flat bed without using bedrails?: A Lot Help needed moving from lying on your back to sitting on the side of a flat bed without using bedrails?: A Lot Help needed moving to and from a bed to a chair (including a wheelchair)?: A Lot Help needed standing up from a chair using your arms (e.g., wheelchair or bedside chair)?: A Lot Help needed to walk in hospital room?: Total Help needed climbing 3-5 steps with a railing? : Total 6 Click Score: 10    End of Session Equipment Utilized During Treatment: Gait belt Activity Tolerance: Patient tolerated treatment well Patient left: with call bell/phone within reach;with chair alarm set;in chair Nurse Communication: Mobility status PT Visit Diagnosis: Other abnormalities of gait and mobility (R26.89);Other symptoms and signs involving the nervous system (R29.898) Hemiplegia - Right/Left: Left Hemiplegia -  dominant/non-dominant: Non-dominant Hemiplegia - caused by: Other cerebrovascular disease     Time: 8656-8592 PT Time Calculation (min) (ACUTE ONLY): 24 min  Charges:    $Gait Training: 8-22 mins $Therapeutic Activity: 8-22 mins PT General Charges $$ ACUTE PT VISIT: 1 Visit                     Larosa Rhines R. PTA Acute Rehabilitation Services Office: [phone]   Therisa CHRISTELLA Boor 12/08/2023, 3:46 PM

## 2023-12-08 NOTE — Progress Notes (Signed)
 PROGRESS NOTE    Ian Wong  FMW:[id number] DOB: [birth year] DOA: 11/30/2023 PCP: Patient, No Pcp Per    Brief Narrative:  63 year old with history of peripheral artery disease, Larrick syndrome, GERD, hypothyroidism who presented to [facility] with persistent right gaze deviation and left hemiaplasia.  CT head was negative.  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  He was given TNK at any pain and was transferred to [facility] for emergent thrombectomy.  Patient was intubated after mechanical thrombectomy and subsequently extubated next day.  He was transferred to medical floor. TRH consulted for ongoing medical management, wheezing and stridor.  Subjective:  Patient seen and examined.  Mild cough present.  Avoid deep suctioning.  He was very sleepy today and denied any complaints to me.  Assessment & Plan:   Acute respiratory insufficiency secondary to procedures and stroke. Wheezing, upper airway sounds and stridor:  Patient is currently on 1 to 2 L of oxygen.  Remains fairly stable at rest.  Completed 5 days course of Unasyn  for suspected aspiration.  COVID-19 negative, respiratory virus panel negative, procalcitonin normal.  Most of the chest x-rays are normal. Treating with short course of prednisone , continue for 3 days.  As needed albuterol .  chest physiotherapy and incentive.  Mucolytic's. CT scan soft tissue of the neck without any complications or swelling.  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  Treated with TNK and then with thrombectomy.  Repeat CT head stable. Echocardiogram with ejection fraction 50 to 55%. LDL 57 A1c 5.9 Previously on aspirin .  Currently on aspirin  and Brilinta  for 90 days for carotid stenting. Followed by neurology.  Referred to CIR. Dysphagia due to stroke, currently remains on dysphagia level 1 diet with honey thick liquids.  Core track placed 1/3.  Currently  tolerating full amount of tube feeding.  Essential hypertension, hypotensive on arrival: Blood pressure is stabilized now.  Off antihypertensives.  Hypothyroidism: On Synthroid . History of seizure: No recent known seizures.  Thank you for allowing us  to continue care for Ian Wong.  We will continue to follow.  He can likely go to CIR whenever they have a bed available.     DVT prophylaxis: enoxaparin  (LOVENOX ) injection 40 mg Start: 12/03/23 1100 SCD's Start: 11/30/23 1607   Code Status: Full code Family Communication: None at the bedside Disposition Plan: Status is: Inpatient Remains inpatient appropriate because: Waiting to go to CIR     Consultants:  TRH  Procedures:  Artery stenting  Antimicrobials:  Completed     Objective: Vitals:   12/07/23 2334 12/08/23 0225 12/08/23 0320 12/08/23 0755  BP: (!) 139/55  (!) 140/57 99/82  Pulse: 76  69 75  Resp: 16  16 18   Temp: 98.4 F (36.9 C)  98.1 F (36.7 C) 98.6 F (37 C)  TempSrc: Oral  Oral Oral  SpO2: 92% 92% 93% 93%  Weight:      Height:        Intake/Output Summary (Last 24 hours) at 12/08/2023 1132 Last data filed at 12/08/2023 0831 Gross per 24 hour  Intake 2489 ml  Output 850 ml  Net 1639 ml   Filed Weights   11/30/23 1442 11/30/23 1445  Weight: 99.5 kg 96.8 kg    Examination:  General exam: Appears calm and comfortable at rest. Respiratory system: Mostly upper airway sounds.  No added sounds. Cardiovascular system: S1 & S2 heard, RRR. No pedal edema. Gastrointestinal system: Soft.  Nontender.  Bowel sound present.   Central nervous system: Alert and oriented. Dysarthric.  Right-sided facial droop.  Left upper extremity mostly proximal muscles are stronger than distal.  Left lower extremity.  Both lower extremity 4 x 5.    Data Reviewed: I have personally reviewed following labs and imaging studies  CBC: Recent Labs  Lab 12/04/23 1057 12/05/23 0441 12/06/23 0613 12/07/23 0624  12/08/23 0527  WBC 7.0 7.1 9.5 10.1 10.9*  HGB 13.7 13.2 13.8 13.8 14.1  HCT 38.5* 36.0* 38.6* 39.5 40.2  MCV 86.3 84.7 85.8 87.2 88.2  PLT 172 179 190 176 164   Basic Metabolic Panel: Recent Labs  Lab 12/02/23 0645 12/02/23 1512 12/04/23 0856 12/05/23 0441 12/06/23 0613 12/07/23 0624 12/08/23 0527  NA 137   < > 142 140 141 137 138  K 3.7   < > 4.0 3.5 3.8 3.9 4.2  CL 109   < > 115* 113* 112* 108 107  CO2 21*   < > 19* 20* 19* 20* 22  GLUCOSE 106*   < > 84 132* 167* 181* 114*  BUN 10   < > 16 21 25* 29* 29*  CREATININE 0.93   < > 0.93 0.84 0.88 0.83 0.86  CALCIUM  8.0*   < > 8.3* 8.2* 8.6* 8.5* 8.4*  MG 2.0  --  2.2  --   --   --   --    < > = values in this interval not displayed.   GFR: Estimated Creatinine Clearance: 107 mL/min (by C-G formula based on SCr of 0.86 mg/dL). Liver Function Tests: No results for input(s): AST, ALT, ALKPHOS, BILITOT, PROT, ALBUMIN  in the last 168 hours.  No results for input(s): LIPASE, AMYLASE in the last 168 hours. No results for input(s): AMMONIA in the last 168 hours. Coagulation Profile: No results for input(s): INR, PROTIME in the last 168 hours.  Cardiac Enzymes: No results for input(s): CKTOTAL, CKMB, CKMBINDEX, TROPONINI in the last 168 hours. BNP (last 3 results) No results for input(s): PROBNP in the last 8760 hours. HbA1C: No results for input(s): HGBA1C in the last 72 hours. CBG: Recent Labs  Lab 12/07/23 1607 12/07/23 1856 12/07/23 2105 12/08/23 0013 12/08/23 0605  GLUCAP 192* 156* 170* 153* 106*   Lipid Profile: No results for input(s): CHOL, HDL, LDLCALC, TRIG, CHOLHDL, LDLDIRECT in the last 72 hours. Thyroid Function Tests: No results for input(s): TSH, T4TOTAL, FREET4, T3FREE, THYROIDAB in the last 72 hours. Anemia Panel: No results for input(s): VITAMINB12, FOLATE, FERRITIN, TIBC, IRON, RETICCTPCT in the last 72 hours. Sepsis Labs: No  results for input(s): PROCALCITON, LATICACIDVEN in the last 168 hours.   Recent Results (from the past 240 hours)  SARS Coronavirus 2 by RT PCR (hospital order, performed in [facility] hospital lab) *cepheid single result test* Anterior Nasal Swab     Status: None   Collection Time: 11/30/23  3:21 PM   Specimen: Anterior Nasal Swab  Result Value Ref Range Status   SARS Coronavirus 2 by RT PCR NEGATIVE NEGATIVE Final    Comment: (NOTE) SARS-CoV-2 target nucleic acids are NOT DETECTED.  The SARS-CoV-2 RNA is generally detectable in upper and lower respiratory specimens during the acute phase of infection. The lowest concentration of SARS-CoV-2 viral copies this assay can detect is 250 copies / mL. A negative result does not preclude SARS-CoV-2 infection and should not be used as the sole basis for treatment or other patient management decisions.  A negative result may occur  with improper specimen collection / handling, submission of specimen other than nasopharyngeal swab, presence of viral mutation(s) within the areas targeted by this assay, and inadequate number of viral copies (<250 copies / mL). A negative result must be combined with clinical observations, patient history, and epidemiological information.  Fact Sheet for Patients:   roadlaptop.co.za  Fact Sheet for Healthcare Providers: http://kim-miller.com/  This test is not yet approved or  cleared by the [place]  FDA and has been authorized for detection and/or diagnosis of SARS-CoV-2 by FDA under an Emergency Use Authorization (EUA).  This EUA will remain in effect (meaning this test can be used) for the duration of the COVID-19 declaration under Section 564(b)(1) of the Act, 21 U.S.C. section 360bbb-3(b)(1), unless the authorization is terminated or revoked sooner.  Performed at [facility], [address] [place], [place] [address]   MRSA Next Gen by PCR, Nasal      Status: None   Collection Time: 11/30/23  6:50 PM   Specimen: Nasal Mucosa; Nasal Swab  Result Value Ref Range Status   MRSA by PCR Next Gen NOT DETECTED NOT DETECTED Final    Comment: (NOTE) The GeneXpert MRSA Assay (FDA approved for NASAL specimens only), is one component of a comprehensive MRSA colonization surveillance program. It is not intended to diagnose MRSA infection nor to guide or monitor treatment for MRSA infections. Test performance is not FDA approved in patients less than 40 years old. Performed at [facility], [address] [place], [place] [address]   Respiratory (~20 pathogens) panel by PCR     Status: None   Collection Time: 12/06/23  6:24 PM   Specimen: Nasopharyngeal Swab; Respiratory  Result Value Ref Range Status   Adenovirus NOT DETECTED NOT DETECTED Final   Coronavirus 229E NOT DETECTED NOT DETECTED Final    Comment: (NOTE) The Coronavirus on the Respiratory Panel, DOES NOT test for the novel  Coronavirus (2019 nCoV)    Coronavirus HKU1 NOT DETECTED NOT DETECTED Final   Coronavirus NL63 NOT DETECTED NOT DETECTED Final   Coronavirus OC43 NOT DETECTED NOT DETECTED Final   Metapneumovirus NOT DETECTED NOT DETECTED Final   Rhinovirus / Enterovirus NOT DETECTED NOT DETECTED Final   Influenza A NOT DETECTED NOT DETECTED Final   Influenza B NOT DETECTED NOT DETECTED Final   Parainfluenza Virus 1 NOT DETECTED NOT DETECTED Final   Parainfluenza Virus 2 NOT DETECTED NOT DETECTED Final   Parainfluenza Virus 3 NOT DETECTED NOT DETECTED Final   Parainfluenza Virus 4 NOT DETECTED NOT DETECTED Final   Respiratory Syncytial Virus NOT DETECTED NOT DETECTED Final   Bordetella pertussis NOT DETECTED NOT DETECTED Final   Bordetella Parapertussis NOT DETECTED NOT DETECTED Final   Chlamydophila pneumoniae NOT DETECTED NOT DETECTED Final   Mycoplasma pneumoniae NOT DETECTED NOT DETECTED Final    Comment: Performed at [facility], [address],  [place], [place] [address]         Radiology Studies: CT SOFT TISSUE NECK W CONTRAST Result Date: 12/07/2023 CLINICAL DATA:  Vocal cord paralysis EXAM: CT NECK WITH CONTRAST TECHNIQUE: Multidetector CT imaging of the neck was performed using the standard protocol following the bolus administration of intravenous contrast. RADIATION DOSE REDUCTION: This exam was performed according to the departmental dose-optimization program which includes automated exposure control, adjustment of the mA and/or kV according to patient size and/or use of iterative reconstruction technique. CONTRAST:  75mL OMNIPAQUE  IOHEXOL  350 MG/ML SOLN COMPARISON:  None Available. FINDINGS: Pharynx  and larynx: Normal. No mass or swelling. Symmetric vocal cords. Salivary glands: No inflammation, mass, or stone. Thyroid: Normal. Lymph nodes: None enlarged or abnormal density. Vascular: Right ICA stent appears patent. Left carotid bifurcation atherosclerosis with left ICA narrowing, better characterized on prior CTA. Aortic atherosclerosis. Limited intracranial: Negative. Visualized orbits: Negative. Mastoids and visualized paranasal sinuses: Clear. Skeleton: No acute abnormality on limited assessment. Upper chest: Visualized lung apices are clear. IMPRESSION: Unremarkable CT of the neck. No visible mass or adenopathy. Symmetric vocal cords by CT. Electronically Signed   By: Gilmore GORMAN Molt M.D.   On: 12/07/2023 01:13        Scheduled Meds:  aspirin   81 mg Oral Daily   atorvastatin   20 mg Oral QPM   Chlorhexidine  Gluconate Cloth  6 each Topical Daily   docusate  100 mg Oral Daily   enoxaparin  (LOVENOX ) injection  40 mg Subcutaneous Q24H   esomeprazole   20 mg Oral QAC breakfast   feeding supplement (PROSource TF20)  60 mL Per Tube BID   free water   250 mL Per Tube Q6H   insulin  aspart  0-9 Units Subcutaneous Q6H   ipratropium-albuterol   3 mL Nebulization Q6H WA   levothyroxine   50 mcg Oral Daily   mouth rinse  15 mL Mouth Rinse  4 times per day   predniSONE   50 mg Oral Q breakfast   ticagrelor   90 mg Oral BID   Continuous Infusions:  feeding supplement (OSMOLITE 1.5 CAL) 1,000 mL (12/08/23 0604)     LOS: 8 days    Time spent: 35 minutes    Renato Applebaum, MD [facility]

## 2023-12-09 DIAGNOSIS — R1310 Dysphagia, unspecified: Secondary | ICD-10-CM | POA: Diagnosis not present

## 2023-12-09 DIAGNOSIS — J9601 Acute respiratory failure with hypoxia: Secondary | ICD-10-CM | POA: Diagnosis not present

## 2023-12-09 DIAGNOSIS — I63511 Cerebral infarction due to unspecified occlusion or stenosis of right middle cerebral artery: Secondary | ICD-10-CM | POA: Diagnosis not present

## 2023-12-09 LAB — GLUCOSE, CAPILLARY
Glucose-Capillary: 145 mg/dL — ABNORMAL HIGH (ref 70–99)
Glucose-Capillary: 158 mg/dL — ABNORMAL HIGH (ref 70–99)
Glucose-Capillary: 138 mg/dL — ABNORMAL HIGH (ref 70–99)
Glucose-Capillary: 149 mg/dL — ABNORMAL HIGH (ref 70–99)
Glucose-Capillary: 173 mg/dL — ABNORMAL HIGH (ref 70–99)
Glucose-Capillary: 161 mg/dL — ABNORMAL HIGH (ref 70–99)

## 2023-12-09 LAB — BASIC METABOLIC PANEL
Anion gap: 11 (ref 5–15)
BUN: 31 mg/dL — ABNORMAL HIGH (ref 8–23)
CO2: 22 mmol/L (ref 22–32)
Calcium: 8.4 mg/dL — ABNORMAL LOW (ref 8.9–10.3)
Chloride: 103 mmol/L (ref 98–111)
Creatinine, Ser: 1.01 mg/dL (ref 0.61–1.24)
GFR, Estimated: >60 mL/min (ref >60)
Glucose, Bld: 149 mg/dL — ABNORMAL HIGH (ref 70–99)
Potassium: 4.1 mmol/L (ref 3.5–5.1)
Sodium: 136 mmol/L (ref 135–145)

## 2023-12-09 LAB — CBC
HCT: 41 % (ref 39.0–52.0)
Hemoglobin: 14.1 g/dL (ref 13.0–17.0)
MCH: 30.6 pg (ref 26.0–34.0)
MCHC: 34.4 g/dL (ref 30.0–36.0)
MCV: 88.9 fL (ref 80.0–100.0)
Platelets: 213 10*3/uL (ref 150–400)
RBC: 4.61 MIL/uL (ref 4.22–5.81)
RDW: 13.4 % (ref 11.5–15.5)
WBC: 10.9 10*3/uL — ABNORMAL HIGH (ref 4.0–10.5)
nRBC: 0 % (ref 0.0–0.2)

## 2023-12-09 MED ORDER — FREE WATER
150.0000 mL | Status: DC
  Administered 2023-12-09 – 2023-12-11 (×12): 150 mL

## 2023-12-09 MED ORDER — INSULIN ASPART 100 UNIT/ML IJ SOLN
0.0000 [IU] | Freq: Three times a day (TID) | INTRAMUSCULAR | Status: DC
  Administered 2023-12-09: 1 [IU] via SUBCUTANEOUS
  Administered 2023-12-10: 2 [IU] via SUBCUTANEOUS
  Administered 2023-12-11 (×2): 1 [IU] via SUBCUTANEOUS

## 2023-12-09 MED ORDER — OSMOLITE 1.5 CAL PO LIQD
1000.0000 mL | ORAL | Status: DC
  Administered 2023-12-09 – 2023-12-10 (×2): 1000 mL
  Filled 2023-12-09: qty 1000

## 2023-12-09 NOTE — Plan of Care (Signed)
  Problem: Education: Goal: Knowledge of General Education information will improve Description: Including pain rating scale, medication(s)/side effects and non-pharmacologic comfort measures Outcome: Progressing   Problem: Clinical Measurements: Goal: Respiratory complications will improve Outcome: Progressing   Problem: Nutrition: Goal: Adequate nutrition will be maintained Outcome: Progressing   Problem: Coping: Goal: Level of anxiety will decrease Outcome: Progressing   Problem: Elimination: Goal: Will not experience complications related to bowel motility Outcome: Progressing Goal: Will not experience complications related to urinary retention Outcome: Progressing   Problem: Pain Management: Goal: General experience of comfort will improve Outcome: Progressing   Problem: Safety: Goal: Ability to remain free from injury will improve Outcome: Progressing

## 2023-12-09 NOTE — Progress Notes (Signed)
 PROGRESS NOTE   Ian Wong  FMW:[id number]    DOB: [birth year]    DOA: 11/30/2023  PCP: Ian Wong, No Pcp Per   I have briefly reviewed patients previous medical records in [facility].  Chief Complaint  Ian Wong presents with   Code Stroke    Brief Hospital Course:  63 year old with history of peripheral artery disease, Larrick syndrome, GERD, hypothyroidism who presented to [facility] with persistent right gaze deviation and left hemiaplasia.  CT head was negative.  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  He was given TNK at [facility] and was transferred to [facility] for emergent thrombectomy.  Ian Wong was intubated after mechanical thrombectomy and subsequently extubated next day.  He was transferred to medical floor. TRH was consulted on 1/5 for ongoing medical management, wheezing and stridor.  Ian Wong appears to be stable from a medical standpoint without any new interventions to be recommended.  TRH will sign off at this time but will be available as needed for any questions or concerns.   Assessment & Plan:  Principal Problem:   Acute ischemic right MCA stroke [facility]) Active Problems:   Acute right MCA stroke (HCC)   Acute hypoxic respiratory failure (HCC)   Acute metabolic encephalopathy   Aspiration pneumonia (HCC)   Acute respiratory failure with hypoxia/stridor Concern for aspiration during intubation and Ian Wong remained intubated postprocedure by IR Extubated 12/31 Continued to be mildly tachypneic in the low 20s with 2 L/min [place] O2 needs on 1/5. Completed 5-day course of Unasyn  for suspected aspiration and 3-day course of prednisone  for suspected COPD exacerbation Negative COVID/RVP/pro calcitonin.  Chest x-ray recently clear. CT scan soft tissue of neck without complications or swelling Acute respiratory failure has resolved and so has stridor.  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  Treated with TNK and then with thrombectomy.  Repeat CT head stable. Echocardiogram with ejection fraction 50 to 55%. LDL 57 A1c 5.9 Previously on aspirin .  Currently on aspirin  and Brilinta  for 90 days for carotid stenting. Followed by neurology.  Referred to CIR. as per CIR, Ian Wong will have to progress some to be eligible for CIR. Dysphagia due to stroke, currently remains on dysphagia level 1 diet with honey thick liquids.  Core track placed 1/3.  As per RN, stopping tube feeds today and monitoring oral intake alone.  Dysphagia Secondary to acute stroke SLP consulted and currently on dysphagia 1 diet.  Defer diet advancement to SLP recommendations. Tube feeds via core track being stopped on 1/8. Changed NovoLog  SSI from every 6 hours to very sensitive 3 times daily with meals and no bedtime scale.  However given A1c of 5.9, once tube feeds are definitively completely stopped, can stop SSI as well.   Essential hypertension, hypotensive on arrival:  Blood pressure is stabilized now.  Off antihypertensives.   Hypothyroidism:  On Synthroid , continue.  History of seizure:  No recent known seizures.  Body mass index is 29.75 kg/m.  Nutritional Status Nutrition Problem: Predicted suboptimal nutrient intake Etiology: acute illness Signs/Symptoms: other (comment) (dysphgia 1 diet with honecy thick lqiuids) Interventions: Refer to RD note for recommendations   DVT prophylaxis: enoxaparin  (LOVENOX ) injection 40 mg Start: 12/03/23 1100 SCD's Start: 11/30/23 1607     Code Status: Full Code:  Family Communication: None at bedside. Disposition:  Status is: Inpatient Remains inpatient appropriate because: Still had core track this morning, advancing diet and awaiting rehab disposition.  Consultants:   TRH are consultants  Procedures:     Antimicrobials:      Subjective:  Ian Wong denies complaints.  No coughing or wheezing.  Some intermittent  dyspnea.  Per RN, plans to stop tube feeds today.  Objective:   Vitals:   12/08/23 2005 12/09/23 0436 12/09/23 0825 12/09/23 1100  BP: (!) 140/80 (!) 145/75 124/78 (!) 148/86  Pulse: 82 81 75 76  Resp: 16 18 20 20   Temp: 98.4 F (36.9 C) 98.6 F (37 C) 98 F (36.7 C) 97.9 F (36.6 C)  TempSrc: Oral Oral Oral Oral  SpO2: 95% 95% 94% 95%  Weight:      Height:        General exam: Middle-age male, moderately built and nourished lying comfortably propped up in bed without distress ENT: Right nostril core track Respiratory system: Clear to auscultation. Respiratory effort normal.  On room air. Cardiovascular system: S1 & S2 heard, RRR. No JVD, murmurs, rubs, gallops or clicks. No pedal edema.  Telemetry personally reviewed: Sinus rhythm. Gastrointestinal system: Abdomen is nondistended, soft and nontender. No organomegaly or masses felt. Normal bowel sounds heard. Central nervous system: Alert and oriented. No focal neurological deficits. Extremities: Right limbs grade 5 x 5 power, LUE 3/5 at least, LLE 4/5 at least. Skin: No rashes, lesions or ulcers Psychiatry: Judgement and insight appear normal. Mood & affect appropriate.     Data Reviewed:   I have personally reviewed following labs and imaging studies   CBC: Recent Labs  Lab 12/07/23 0624 12/08/23 0527 12/09/23 0617  WBC 10.1 10.9* 10.9*  HGB 13.8 14.1 14.1  HCT 39.5 40.2 41.0  MCV 87.2 88.2 88.9  PLT 176 164 213    Basic Metabolic Panel: Recent Labs  Lab 12/04/23 0856 12/05/23 0441 12/06/23 0613 12/07/23 0624 12/08/23 0527 12/09/23 0617  NA 142 140 141 137 138 136  K 4.0 3.5 3.8 3.9 4.2 4.1  CL 115* 113* 112* 108 107 103  CO2 19* 20* 19* 20* 22 22  GLUCOSE 84 132* 167* 181* 114* 149*  BUN 16 21 25* 29* 29* 31*  CREATININE 0.93 0.84 0.88 0.83 0.86 1.01  CALCIUM  8.3* 8.2* 8.6* 8.5* 8.4* 8.4*  MG 2.2  --   --   --   --   --     Liver Function Tests: No results for input(s): AST, ALT, ALKPHOS,  BILITOT, PROT, ALBUMIN  in the last 168 hours.  CBG: Recent Labs  Lab 12/09/23 0434 12/09/23 0907 12/09/23 1229  GLUCAP 158* 138* 149*    Microbiology Studies:   Recent Results (from the past 240 hours)  SARS Coronavirus 2 by RT PCR (hospital order, performed in [facility] hospital lab) *cepheid single result test* Anterior Nasal Swab     Status: None   Collection Time: 11/30/23  3:21 PM   Specimen: Anterior Nasal Swab  Result Value Ref Range Status   SARS Coronavirus 2 by RT PCR NEGATIVE NEGATIVE Final    Comment: (NOTE) SARS-CoV-2 target nucleic acids are NOT DETECTED.  The SARS-CoV-2 RNA is generally detectable in upper and lower respiratory specimens during the acute phase of infection. The lowest concentration of SARS-CoV-2 viral copies this assay can detect is 250 copies / mL. A negative result does not preclude SARS-CoV-2 infection and should not be used as the sole basis for treatment or other Ian Wong management decisions.  A negative result may occur with improper specimen collection / handling, submission of specimen other than nasopharyngeal swab, presence  of viral mutation(s) within the areas targeted by this assay, and inadequate number of viral copies (<250 copies / mL). A negative result must be combined with clinical observations, Ian Wong history, and epidemiological information.  Fact Sheet for Patients:   roadlaptop.co.za  Fact Sheet for Healthcare Providers: http://kim-miller.com/  This test is not yet approved or  cleared by the [place]  FDA and has been authorized for detection and/or diagnosis of SARS-CoV-2 by FDA under an Emergency Use Authorization (EUA).  This EUA will remain in effect (meaning this test can be used) for the duration of the COVID-19 declaration under Section 564(b)(1) of the Act, 21 U.S.C. section 360bbb-3(b)(1), unless the authorization is terminated or revoked  sooner.  Performed at [facility], [address] [place], [place] [address]   MRSA Next Gen by PCR, Nasal     Status: None   Collection Time: 11/30/23  6:50 PM   Specimen: Nasal Mucosa; Nasal Swab  Result Value Ref Range Status   MRSA by PCR Next Gen NOT DETECTED NOT DETECTED Final    Comment: (NOTE) The GeneXpert MRSA Assay (FDA approved for NASAL specimens only), is one component of a comprehensive MRSA colonization surveillance program. It is not intended to diagnose MRSA infection nor to guide or monitor treatment for MRSA infections. Test performance is not FDA approved in patients less than 87 years old. Performed at [facility], [address] [place], [place] [address]   Respiratory (~20 pathogens) panel by PCR     Status: None   Collection Time: 12/06/23  6:24 PM   Specimen: Nasopharyngeal Swab; Respiratory  Result Value Ref Range Status   Adenovirus NOT DETECTED NOT DETECTED Final   Coronavirus 229E NOT DETECTED NOT DETECTED Final    Comment: (NOTE) The Coronavirus on the Respiratory Panel, DOES NOT test for the novel  Coronavirus (2019 nCoV)    Coronavirus HKU1 NOT DETECTED NOT DETECTED Final   Coronavirus NL63 NOT DETECTED NOT DETECTED Final   Coronavirus OC43 NOT DETECTED NOT DETECTED Final   Metapneumovirus NOT DETECTED NOT DETECTED Final   Rhinovirus / Enterovirus NOT DETECTED NOT DETECTED Final   Influenza A NOT DETECTED NOT DETECTED Final   Influenza B NOT DETECTED NOT DETECTED Final   Parainfluenza Virus 1 NOT DETECTED NOT DETECTED Final   Parainfluenza Virus 2 NOT DETECTED NOT DETECTED Final   Parainfluenza Virus 3 NOT DETECTED NOT DETECTED Final   Parainfluenza Virus 4 NOT DETECTED NOT DETECTED Final   Respiratory Syncytial Virus NOT DETECTED NOT DETECTED Final   Bordetella pertussis NOT DETECTED NOT DETECTED Final   Bordetella Parapertussis NOT DETECTED NOT DETECTED Final   Chlamydophila pneumoniae NOT DETECTED NOT DETECTED Final   Mycoplasma  pneumoniae NOT DETECTED NOT DETECTED Final    Comment: Performed at [facility], [address] [place], [place] [address]    Radiology Studies:  No results found.  Scheduled Meds:    aspirin   81 mg Oral Daily   atorvastatin   20 mg Oral QPM   Chlorhexidine  Gluconate Cloth  6 each Topical Daily   docusate  100 mg Oral Daily   enoxaparin  (LOVENOX ) injection  40 mg Subcutaneous Q24H   esomeprazole   20 mg Oral QAC breakfast   feeding supplement  237 mL Oral TID BM   feeding supplement (OSMOLITE 1.5 CAL)  1,000 mL Per Tube Q24H   feeding supplement (PROSource TF20)  60 mL Per Tube BID   free water   250 mL Per Tube Q6H  insulin  aspart  0-6 Units Subcutaneous TID WC   ipratropium-albuterol   3 mL Nebulization Q6H WA   levothyroxine   50 mcg Oral Daily   mouth rinse  15 mL Mouth Rinse 4 times per day   ticagrelor   90 mg Oral BID    Continuous Infusions:     LOS: 9 days     Trenda Mar, MD,  FACP, [facility], [facility], [facility]   [facility] [place]      To contact the attending provider between 7A-7P or the covering provider during after hours 7P-7A, please log into the web site [email or web address] and access using universal [place] password for that web site. If you do not have the password, please call the hospital operator.  12/09/2023, 1:50 PM

## 2023-12-09 NOTE — Progress Notes (Signed)
 Inpatient Rehab Admissions Coordinator:    CIR following, but needs to progress some on acute if he is to be admitted to CIR. He will have no supervision at home so will need to progress to a point that 2 weeks on CIR could get him to supervision level. If ready for d/c, TOC will need to consider other rehab venues.   Leita Kleine, MS, CCC-SLP Rehab Admissions Coordinator  [phone] (celll) [phone] (office)

## 2023-12-09 NOTE — Progress Notes (Signed)
 STROKE TEAM PROGRESS NOTE   [name] is a 63 y.o. male with past medical history significant for seizures, hypothyroidism, GERD who presented to [facility] ED 12/30 with persistent right gaze deviation and left hemiplegia.  His last known well was 1245 family did call EMS when his symptoms started.  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  Patient was given TNK at [facility] and was transferred to [facility] for emergent thrombectomy. On neurologic neurology exam on arrival to [facility] patient still had persistent left-sided weakness, right gaze, left-sided sensory deficit, left hemianopsia, left sensory neglect. He was then taken emergently to IR. NIH 18 on admit  SIGNIFICANT HOSPITAL EVENTS 12/30: Transferred from [facility] to [facility] for emergent thrombectomy             Mechanical thrombectomy, right carotid stent and angioplasty  --> TICI 3             Left intubated postprocedure             Post procedure CT showed no hemorrhage. 12/31: MRI shows extensive acute right MCA territory infarcts with chronic microhemorrhages versus acute petechial hemorrhages.  1/1: Remains in ICU. Ongoing Neo for BP support. SLP NPO except for meds. 1/2: Hypertonic saline discontinued 1/3: Coretrak placed 1/7: diet advanced to nectar thick - change TF to nocturnal TF  INTERVAL HISTORY Two sisters are at the bedside. Pt lying in bed, wheezing better but still has low appetite but drinking ensure as per sisters.    Vitals:   12/09/23 0436 12/09/23 0825 12/09/23 1100 12/09/23 1539  BP: (!) 145/75 124/78 (!) 148/86 (!) 143/78  Pulse: 81 75 76 75  Resp: 18 20 20 20   Temp: 98.6 F (37 C) 98 F (36.7 C) 97.9 F (36.6 C) 98.4 F (36.9 C)  TempSrc: Oral Oral Oral Oral  SpO2: 95% 94% 95% 97%  Weight:      Height:       CBC:  Recent Labs  Lab 12/08/23 0527 12/09/23 0617  WBC 10.9* 10.9*  HGB 14.1 14.1  HCT 40.2 41.0  MCV 88.2  88.9  PLT 164 213   Basic Metabolic Panel:  Recent Labs  Lab 12/04/23 0856 12/05/23 0441 12/08/23 0527 12/09/23 0617  NA 142   < > 138 136  K 4.0   < > 4.2 4.1  CL 115*   < > 107 103  CO2 19*   < > 22 22  GLUCOSE 84   < > 114* 149*  BUN 16   < > 29* 31*  CREATININE 0.93   < > 0.86 1.01  CALCIUM  8.3*   < > 8.4* 8.4*  MG 2.2  --   --   --    < > = values in this interval not displayed.   Lipid Panel:  No results for input(s): CHOL, TRIG, HDL, CHOLHDL, VLDL, LDLCALC in the last 168 hours.  HgbA1c:  No results for input(s): HGBA1C in the last 168 hours.  Urine Drug Screen:  Recent Labs  Lab 12/04/23 2355  LABOPIA NONE DETECTED  COCAINSCRNUR NONE DETECTED  LABBENZ NONE DETECTED  AMPHETMU NONE DETECTED  THCU NONE DETECTED  LABBARB NONE DETECTED    Alcohol Level  No results for input(s): ETH in the last 168 hours.   IMAGING past 24 hours No results found.    PHYSICAL EXAM Temp:  [97.9 F (36.6 C)-98.6 F (37  C)] 98.4 F (36.9 C) (01/08 1539) Pulse Rate:  [75-82] 75 (01/08 1539) Resp:  [16-20] 20 (01/08 1539) BP: (124-148)/(75-86) 143/78 (01/08 1539) SpO2:  [94 %-97 %] 97 % (01/08 1539)  General - Well nourished, well developed middle-age Caucasian male, in no apparent distress. HEENT- atraumatic, normocephalic  Cardiovascular - Regular rhythm and rate. Pulmonary- equal chest rise; unlabored respirations. Bilateral rhonchi and continued wheezing.   Neuro: Mental Status: Patient is awake, alert, oriented to person, place, month, year, and situation.continues to follow all commands.  Patient is able to give a clear and coherent history with moderate dysarthria. No aphasia.  No signs of aphasia or neglect Cranial Nerves: II: Visual Fields are full. Pupils are equal, round, and reactive to light.   III,IV, VI: o gaze palsy, tracking examiner bilaterally  V: Facial sensation is symmetric to light touch CPP:Mphyu facial droop VIII: hearing is  intact to voice X: Uvula elevates symmetrically XI: Shoulder shrug is symmetric. XII: tongue is midline  Motor: Tone is normal. Bulk is normal.  RUE: 4/5, no drift LUE: Increased movement seen, 3/5. Lateral movement greater than vertical. Continue soft brace.  BLE: No drift present antigravity strength.  Sensory: Sensation is symmetric to light touch in the arms and legs. Cerebellar: FNF intact on RUE, no ataxia seen. Gait:  Deferred   ASSESSMENT/PLAN [name] is a 63 y.o. male with history of GERD, Hypothyroid, and seizures presenting with acute stroke.   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  S/p IR with TICI3 and right CAS MRI: moderate acute right MCA territory infarcts, chronic microhemorrhages versus acute petechial hemorrhages seen along the right basal ganglia. Chronic lacunar infarct within the left thalamus CT repeat stable infarct  2D Echo: LVEF 50 to 55% LDL 57 HgbA1c 5.9 UDS neg VTE prophylaxis - lovenox  aspirin  81 mg daily prior to admission, continue aspirin  81 mg daily and Brilinta  (ticagrelor ) 90 mg bid for carotid stenting Therapy recommendations: CIR vs. SNF Disposition:  pending   Essential Hypertension Hypotension, resolved Home meds:  none Stable now SBP goal above 110 Long term BP goal normotensive   COPD exacebation Respiratory distress Exertional wheezing, improving On prednisone  for 5 days and nebs PRN Completed unasyn  x 5 days course Improving exertional wheezing On 1-2L O2 Hospital service on board  Episode of vomiting 1/6 with gagging  Hyperlipidemia Home meds: Lipitor 20 mg, resumed in hospital LDL 57,  goal < 70 Continue statin at discharge   Dysphagia Patient has post-stroke dysphagia, SLP consulted Now on Dysphagia level 1 and honey thick -> nectar thick liquids Decreased appetite and put on ensures Change TF to nocturnal feeding to boost po intake Coretrak placed 1/3 Dietitian on board   Other Stroke Risk Factors Former Cigarette Smoker   Other Active Problems Hypothyroidism Hx of remote seizure at young age, no seizures for a long time, no AED at home  Hospital day # 9   Ary Cummins, MD PhD Stroke Neurology 12/09/2023 6:02 PM

## 2023-12-09 NOTE — Progress Notes (Addendum)
 Nutrition Follow-up  DOCUMENTATION CODES:  Not applicable  INTERVENTION:  Continue tube feeding via Cortrak. : Osmolite 1.5 at 50 ml/h x 15 hours (1200 ml per day) Prosource TF20 60 ml BID 150 ml FWF Q4H Provides 1960 kcal, 115 gm protein, 914 ml free water  daily (1814 ml TF+flush) Continue to monitor PO intake and adjust TF rate as needed Change Magic Cup to Nectar Thick Mighty Shake TID, each supplement provides 330kcal and 9g protein  Ensure Enlive po TID, each supplement provides 350 kcal and 20 grams of protein.  NUTRITION DIAGNOSIS:  Predicted suboptimal nutrient intake related to acute illness as evidenced by other (comment) (dysphgia 1 diet with honecy thick lqiuids). - ongoing  GOAL:  Patient will meet greater than or equal to 90% of their needs  - progressing, being met with TF and PO intake  MONITOR:  PO intake, TF tolerance, Supplement acceptance, Diet advancement, Labs  REASON FOR ASSESSMENT:  Consult Enteral/tube feeding initiation and management  ASSESSMENT:  Pt with hx of GERD and seizure disorder presented to ED with R gaze deviation and difficulty speaking. Imaging in ED suggestive of ischemic right MCA stroke.  12/30 - Intubated, TNKase  and mechanical thrombectomy with right carotid stenting and angioplasty, NPO  12/31 - Extubated, NPO 1/2 - Hypertonic saline discontinued 1/3 - MBS, DYS1 diet, honey thick liquids, Cortrak placed  1/7 - MBS, DYS1 diet, nectar thick liquids, adjusted to nocturnal feeds  Pt resting in bed at the time of assessment, flat affect. Breakfast tray noted at bedside to be completely untouched. Pt has had his TF off for several hours as feeds we adjusted to nocturnal 1/7. Pt reports that he does not have an appetite. Explored further and states that the puree food is not appealing to him but states that he has consumed both of the ensures that he has been offered. Discussed that PO intake will need to increase to be able to remove the  cortrak tube.   Will decrease rate and infusion time of TF slightly as pt is taking in kcal from supplements. If intake continues to improve will make further adjustments.   Admit weight: 99.5 kg  Current weight: 96.8 kg    Average Meal Intake: 1/3-1/7: 18% intake x 5 recorded meals  Nutritionally Relevant Medications: Scheduled Meds:  atorvastatin   20 mg Oral QPM   docusate  100 mg Oral Daily   esomeprazole   20 mg Oral QAC breakfast   Ensure Enlive   237 mL Oral TID BM   OSMOLITE 1.5 CAL  1,000 mL Per Tube Q24H   PROSource TF20  60 mL Per Tube BID   free water   250 mL Per Tube Q6H   insulin  aspart  0-6 Units Subcutaneous TID WC   PRN Meds: calcium  carbonate, ondansetron , senna-docusate  Labs Reviewed: BUN 31 CBG ranges from 106-158 mg/dL over the last 24 hours HgbA1c 5.9%  Diet Order:   Diet Orders (From admission, onward)     Start     Ordered   12/08/23 1329  DIET - DYS 1 Room service appropriate? Yes with Assist; Fluid consistency: Nectar Thick  Diet effective now       Question Answer Comment  Room service appropriate? Yes with Assist   Fluid consistency: Nectar Thick      12/08/23 1328           EDUCATION NEEDS:  Education needs have been addressed  Skin:  Skin Assessment: Reviewed RN Assessment Skin Integrity Issues:: Incisions Incisions: R  thigh  Last BM:  1/7 - type 4  Height:  Ht Readings from Last 1 Encounters:  11/30/23 5' 11 (1.803 m)    Weight:  Wt Readings from Last 1 Encounters:  11/30/23 96.8 kg    Ideal Body Weight:  78.18 kg  BMI:  Body mass index is 29.75 kg/m.  Estimated Nutritional Needs:  Kcal:  2000-2300 kcal/d Protein:  105-120 g/d Fluid:  2-2.3L/d    Vernell Lukes, RD, LDN Registered Dietitian II Please reach out via secure chat Weekend on-call pager # available in [facility]

## 2023-12-09 NOTE — Progress Notes (Signed)
 Physical Therapy Treatment Patient Details Name: [facility] MRN: [id number] DOB: 02-23-1961 Today's Date: 12/09/2023   History of Present Illness 63 yo male transfer from [facility] with L side weakness and R gaze deviation CT (+) R MCA 12/31 thrombectomy  extubated 12/31 PMH GERD hypothyroidism    PT Comments  Pt demonstrating continued progress towards acute goals. Pt able to progress gait distance significantly this session with RW for support and up to mod A to maintain midline balance and keep L hand on Rw as pt with tendency for L lateral lean with fatigue and L hand slipping off RW handle due to poor grip strength and decreased L attention. Pt continues to require assist for RW management as pt bumping obstacles on L and with poor problem solving to avoid needing assist to correct path. Pt able to complete bed mobility with CGA and transfers with up to min A for steadying assist and to place L hand on RW on rise.  Current plan remains appropriate to address deficits and maximize functional independence and decrease caregiver burden. Pt continues to benefit from skilled PT services to progress toward functional mobility goals.      If plan is discharge home, recommend the following: A lot of help with bathing/dressing/bathroom;Assistance with cooking/housework;Assist for transportation;Help with stairs or ramp for entrance;Two people to help with walking and/or transfers;Direct supervision/assist for medications management;Direct supervision/assist for financial management   Can travel by private vehicle        Equipment Recommendations  Other (comment)    Recommendations for Other Services       Precautions / Restrictions Precautions Precautions: Fall Precaution Comments: L inattention with some improvement Restrictions Weight Bearing Restrictions Per Provider Order: No     Mobility  Bed Mobility Overal bed mobility: Needs Assistance Bed Mobility: Supine to Sit, Sit to Supine      Supine to sit: Contact guard, HOB elevated, Used rails Sit to supine: Contact guard assist   General bed mobility comments: cues for sequencing and to utilize bedrail, increased time to complete but no physcal assist needed    Transfers Overall transfer level: Needs assistance Equipment used: Rolling walker (2 wheels) Transfers: Sit to/from Stand, Bed to chair/wheelchair/BSC Sit to Stand: Min assist           General transfer comment: min A to place L hand on RW in standing    Ambulation/Gait Ambulation/Gait assistance: Mod assist, Min assist Gait Distance (Feet): 18 Feet (+ 86') Assistive device: Rolling walker (2 wheels) Gait Pattern/deviations: Step-to pattern, Decreased step length - right, Decreased step length - left, Decreased stride length, Decreased weight shift to left, Decreased weight shift to right, Trunk flexed, Knee flexed in stance - left Gait velocity: reduced     General Gait Details: asssit to keep L hand on RW and attend to L environment as pt bumping obstacles and with poor correction of path. mod A to correct L lateral lean with fatigue and cues for forward gaze upright trunk and increased LLE clearance, pt leaving L foot outside RW x2 during turning needing max cues to look down and correct   Stairs             Wheelchair Mobility     Tilt Bed    Modified Rankin (Stroke Patients Only) Modified Rankin (Stroke Patients Only) Pre-Morbid Rankin Score: No symptoms Modified Rankin: Severe disability     Balance Overall balance assessment: Needs assistance Sitting-balance support: Single extremity supported, Feet supported Sitting balance-Leahy Scale:  Fair Sitting balance - Comments: Pt able to find and hold midline for extend periods of time with UE's, but without assist, cuing only. Postural control: Left lateral lean Standing balance support: Bilateral upper extremity supported, During functional activity Standing balance-Leahy Scale:  Poor Standing balance comment: min A for standing balance with R UE support                            Cognition Arousal: Alert Behavior During Therapy: Flat affect Overall Cognitive Status: Impaired/Different from baseline Area of Impairment: Attention, Safety/judgement, Awareness, Problem solving                   Current Attention Level: Sustained     Safety/Judgement: Decreased awareness of safety, Decreased awareness of deficits Awareness: Intellectual Problem Solving: Slow processing, Decreased initiation, Difficulty sequencing, Requires verbal cues General Comments: increased cues to attend to L        Exercises      General Comments        Pertinent Vitals/Pain Pain Assessment Pain Assessment: No/denies pain    Home Living                          Prior Function            PT Goals (current goals can now be found in the care plan section) Acute Rehab PT Goals PT Goal Formulation: With patient/family Time For Goal Achievement: 12/16/23 Progress towards PT goals: Progressing toward goals    Frequency    Min 4X/week      PT Plan      Co-evaluation              AM-PAC PT 6 Clicks Mobility   Outcome Measure  Help needed turning from your back to your side while in a flat bed without using bedrails?: A Lot Help needed moving from lying on your back to sitting on the side of a flat bed without using bedrails?: A Lot Help needed moving to and from a bed to a chair (including a wheelchair)?: A Lot Help needed standing up from a chair using your arms (e.g., wheelchair or bedside chair)?: A Lot Help needed to walk in hospital room?: A Lot Help needed climbing 3-5 steps with a railing? : Total 6 Click Score: 11    End of Session Equipment Utilized During Treatment: Gait belt Activity Tolerance: Patient tolerated treatment well Patient left: with call bell/phone within reach;in bed;with bed alarm set Nurse  Communication: Mobility status PT Visit Diagnosis: Other abnormalities of gait and mobility (R26.89);Other symptoms and signs involving the nervous system (R29.898) Hemiplegia - Right/Left: Left Hemiplegia - dominant/non-dominant: Non-dominant Hemiplegia - caused by: Other cerebrovascular disease     Time: 1052-1115 PT Time Calculation (min) (ACUTE ONLY): 23 min  Charges:    $Gait Training: 8-22 mins $Therapeutic Activity: 8-22 mins PT General Charges $$ ACUTE PT VISIT: 1 Visit                     Jaquelyn Sakamoto R. PTA Acute Rehabilitation Services Office: [phone]   Therisa CHRISTELLA Boor 12/09/2023, 12:00 PM

## 2023-12-09 NOTE — Progress Notes (Signed)
 Speech Language Pathology Treatment: Dysphagia  Patient Details Name: [facility] MRN: [id number] DOB: 05-10-1961 Today's Date: 12/09/2023 Time: 1210-1225 SLP Time Calculation (min) (ACUTE ONLY): 15 min  Assessment / Plan / Recommendation Clinical Impression  Patient seen by SLP for skilled treatment focused on dysphagia goals. When SLP entered room, patient awake sitting up in bed and with breakfast meal tray in front of him which appeared largely untouched. He asked SLP about his Nexium  and that he couldn't eat until he had it. SLP checked his chart and noted that he had his Nexium  (likely via Cortrak feeding tube) at 0845. Patient was receptive to having sips of nectar thickened water  via cup sips and straw sips. When fed by SLP, he took a few bites of puree foods from lunch meal tray which arrived when SLP in room but he did not initiate self-feeding of foods. He did independently pick up cup and drink nectar thick liquids. Unfortunately, patient continues with very poor PO intake. SLP will follow for diet toleration, ability to advance with solids and liquids.     HPI HPI: 63 yo male transfer from [facility] with L side weakness and R gaze deviation s/p TNK and thrombectomy 12/30. There was some concern for aspiration after vomiting episode during intubation so was kept on vent until 12/31. 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 Repeate CT 1/1 due to decreased wakefulness showed increasing mass effect on the R lateral ventricle.  PMH includes: GERD, hypothyroidism, seizures. MBS completed on 12/04/23 recommending Dys 1 honey thick liquids. Repeat MBS to determine readiness to advance.      SLP Plan  Continue with current plan of care      Recommendations for follow up therapy are one component of a multi-disciplinary discharge planning process, led by the attending physician.  Recommendations may be updated based on patient status, additional functional criteria and insurance  authorization.    Recommendations  Diet recommendations: Dysphagia 1 (puree);Nectar-thick liquid Liquids provided via: Cup;Straw;Teaspoon Medication Administration: Crushed with puree Supervision: Staff to assist with self feeding;Full supervision/cueing for compensatory strategies Compensations: Minimize environmental distractions;Slow rate;Small sips/bites;Other (Comment) Postural Changes and/or Swallow Maneuvers: Seated upright 90 degrees;Upright 30-60 min after meal                  Oral care BID   Frequent or constant Supervision/Assistance Dysphagia, oropharyngeal phase (R13.12)     Continue with current plan of care    Norleen IVAR Blase, MA, CCC-SLP Speech Therapy

## 2023-12-10 DIAGNOSIS — I63511 Cerebral infarction due to unspecified occlusion or stenosis of right middle cerebral artery: Secondary | ICD-10-CM | POA: Diagnosis not present

## 2023-12-10 DIAGNOSIS — R1310 Dysphagia, unspecified: Secondary | ICD-10-CM | POA: Diagnosis not present

## 2023-12-10 DIAGNOSIS — J9601 Acute respiratory failure with hypoxia: Secondary | ICD-10-CM | POA: Diagnosis not present

## 2023-12-10 LAB — GLUCOSE, CAPILLARY
Glucose-Capillary: 224 mg/dL — ABNORMAL HIGH (ref 70–99)
Glucose-Capillary: 123 mg/dL — ABNORMAL HIGH (ref 70–99)
Glucose-Capillary: 123 mg/dL — ABNORMAL HIGH (ref 70–99)
Glucose-Capillary: 225 mg/dL — ABNORMAL HIGH (ref 70–99)

## 2023-12-10 MED ORDER — PREDNISONE 5 MG PO TABS: 10.0000 mg | ORAL_TABLET | Freq: Every day | ORAL | Status: DC

## 2023-12-10 MED ORDER — PREDNISONE 20 MG PO TABS: 20.0000 mg | ORAL_TABLET | Freq: Every day | ORAL | Status: DC

## 2023-12-10 MED ORDER — PREDNISONE 5 MG PO TABS
30.0000 mg | ORAL_TABLET | Freq: Every day | ORAL | Status: AC
  Administered 2023-12-10 – 2023-12-11 (×2): 30 mg via ORAL
  Filled 2023-12-10 (×2): qty 2

## 2023-12-10 NOTE — Plan of Care (Signed)
  Problem: Pain Management: Goal: General experience of comfort will improve Outcome: Progressing   Problem: Safety: Goal: Ability to remain free from injury will improve Outcome: Progressing

## 2023-12-10 NOTE — Progress Notes (Addendum)
 PROGRESS NOTE   Ian Wong  FMW:[id number]    DOB: 01/02/1961    DOA: 11/30/2023  PCP: Patient, No Pcp Per   I have briefly reviewed patients previous medical records in [facility].  Chief Complaint  Patient presents with   Code Stroke    Brief Hospital Course:  63 year old with history of peripheral artery disease, Larrick syndrome, GERD, hypothyroidism who presented to [facility] with persistent right gaze deviation and left hemiaplasia.  CT head was negative.  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  He was given TNK at [facility] and was transferred to [facility] for emergent thrombectomy.  Patient was intubated after mechanical thrombectomy and subsequently extubated next day.  He was transferred to medical floor. TRH was consulted on 1/5 for ongoing medical management, wheezing and stridor.  Initiating slow prednisone  taper due to ongoing wheezing.  TRH will follow-up again 1/10.   Assessment & Plan:  Principal Problem:   Acute ischemic right MCA stroke [facility]) Active Problems:   Acute right MCA stroke (HCC)   Acute hypoxic respiratory failure (HCC)   Acute metabolic encephalopathy   Aspiration pneumonia (HCC)   Acute respiratory failure with hypoxia/stridor Concern for aspiration during intubation and patient remained intubated postprocedure by IR Extubated 12/31 Continued to be mildly tachypneic in the low 20s with 2 L/min [place] O2 needs on 1/5. Completed 5-day course of Unasyn  for suspected aspiration and 3-day course of prednisone  for suspected COPD exacerbation Negative COVID/RVP/pro calcitonin.  Chest x-ray recently clear. CT scan soft tissue of neck without complications or swelling Acute respiratory failure has resolved and so has stridor. Given ongoing some wheezing, recommend low-dose prednisone  taper over 6 days.  Starting with prednisone  30 Mg daily, taper by 10 mg q. 2 days.  Acute MCA territory stroke with left hemiplegia: Presented with  dysarthria, right facial droop and left-sided weakness.  Treated with TNK and then with thrombectomy.  Repeat CT head stable. Echocardiogram with ejection fraction 50 to 55%. LDL 57 A1c 5.9 Previously on aspirin .  Currently on aspirin  and Brilinta  for 90 days for carotid stenting. Followed by neurology.  Referred to CIR. as per CIR, patient will have to progress some to be eligible for CIR. Dysphagia due to stroke, currently remains on dysphagia level 1 diet with honey thick liquids.  Core track placed 1/3.  As per attending note yesterday, tube feeds changed to nighttime only with p.o. feeds in the daytime.  Dysphagia Secondary to acute stroke SLP managing diet, currently on dysphagia 2 diet and nectar thickened liquids.  No documentation of how much she has taken p.o. Tube feeds (via core track) changed to at night (6 PM to 9 AM) and p.o. diet in the daytime. A1c 5.9.  On SSI only because he is on tube feeds.  CBGs labile and mildly uncontrolled up to 224.  Initiation of prednisone  may worsen glycemic control.   Essential hypertension, hypotensive on arrival:  Blood pressure is stabilized now.  Off antihypertensives.   Hypothyroidism:  On Synthroid , continue.  History of seizure:  No recent known seizures.  Body mass index is 29.75 kg/m.  Nutritional Status Nutrition Problem: Predicted suboptimal nutrient intake Etiology: acute illness Signs/Symptoms: other (comment) (dysphgia 1 diet with honecy thick lqiuids) Interventions: Refer to RD note for recommendations   DVT prophylaxis: enoxaparin  (LOVENOX ) injection 40 mg Start: 12/03/23 1100 SCD's Start: 11/30/23 1607     Code Status: Full Code:  Family Communication: None at bedside. Disposition:  Status is: Inpatient Remains inpatient appropriate because: Still had core track this morning, advancing diet and awaiting rehab disposition.     Consultants:   TRH are consultants  Procedures:     Antimicrobials:       Subjective:  Patient seen along with RT in room.  Getting nebulized.  Patient denies cough or dyspnea.  Objective:   Vitals:   12/10/23 0400 12/10/23 0754 12/10/23 0818 12/10/23 1222  BP: (!) 142/77 139/73  (!) 168/93  Pulse: 79 79  85  Resp:  20    Temp: 98.2 F (36.8 C) 98.5 F (36.9 C)  98.4 F (36.9 C)  TempSrc: Oral Oral  Oral  SpO2: 96% 97% 97% 96%  Weight:      Height:        General exam: Middle-age male, moderately built and nourished lying comfortably propped up in bed without distress ENT: Right nostril core track Respiratory system: Has wheezing, few in the anterior lung fields,?  Transmitted upper airway sounds.  No rhonchi or crackles.  No increased work of breathing.  Good air entry. Cardiovascular system: S1 & S2 heard, RRR. No JVD, murmurs, rubs, gallops or clicks. No pedal edema.  Telemetry personally reviewed: Sinus rhythm. Gastrointestinal system: Abdomen is nondistended, soft and nontender. No organomegaly or masses felt. Normal bowel sounds heard. Central nervous system: Alert and oriented. No focal neurological deficits. Extremities: Right limbs grade 5 x 5 power, LUE 3/5 at least, LLE 4/5 at least. Skin: No rashes, lesions or ulcers Psychiatry: Judgement and insight appear normal. Mood & affect appropriate.     Data Reviewed:   I have personally reviewed following labs and imaging studies   CBC: Recent Labs  Lab 12/07/23 0624 12/08/23 0527 12/09/23 0617  WBC 10.1 10.9* 10.9*  HGB 13.8 14.1 14.1  HCT 39.5 40.2 41.0  MCV 87.2 88.2 88.9  PLT 176 164 213    Basic Metabolic Panel: Recent Labs  Lab 12/04/23 0856 12/05/23 0441 12/06/23 0613 12/07/23 0624 12/08/23 0527 12/09/23 0617  NA 142 140 141 137 138 136  K 4.0 3.5 3.8 3.9 4.2 4.1  CL 115* 113* 112* 108 107 103  CO2 19* 20* 19* 20* 22 22  GLUCOSE 84 132* 167* 181* 114* 149*  BUN 16 21 25* 29* 29* 31*  CREATININE 0.93 0.84 0.88 0.83 0.86 1.01  CALCIUM  8.3* 8.2* 8.6* 8.5*  8.4* 8.4*  MG 2.2  --   --   --   --   --     Liver Function Tests: No results for input(s): AST, ALT, ALKPHOS, BILITOT, PROT, ALBUMIN  in the last 168 hours.  CBG: Recent Labs  Lab 12/09/23 2135 12/10/23 0631 12/10/23 1157  GLUCAP 161* 224* 123*    Microbiology Studies:   Recent Results (from the past 240 hours)  SARS Coronavirus 2 by RT PCR (hospital order, performed in [facility] hospital lab) *cepheid single result test* Anterior Nasal Swab     Status: None   Collection Time: 11/30/23  3:21 PM   Specimen: Anterior Nasal Swab  Result Value Ref Range Status   SARS Coronavirus 2 by RT PCR NEGATIVE NEGATIVE Final    Comment: (NOTE) SARS-CoV-2 target nucleic acids are NOT DETECTED.  The SARS-CoV-2 RNA is generally detectable in upper and lower respiratory specimens during the acute phase of infection. The lowest concentration of SARS-CoV-2 viral copies this assay can detect is 250 copies / mL. A negative result does not preclude SARS-CoV-2 infection and should not be  used as the sole basis for treatment or other patient management decisions.  A negative result may occur with improper specimen collection / handling, submission of specimen other than nasopharyngeal swab, presence of viral mutation(s) within the areas targeted by this assay, and inadequate number of viral copies (<250 copies / mL). A negative result must be combined with clinical observations, patient history, and epidemiological information.  Fact Sheet for Patients:   roadlaptop.co.za  Fact Sheet for Healthcare Providers: http://kim-miller.com/  This test is not yet approved or  cleared by the [place]  FDA and has been authorized for detection and/or diagnosis of SARS-CoV-2 by FDA under an Emergency Use Authorization (EUA).  This EUA will remain in effect (meaning this test can be used) for the duration of the COVID-19 declaration under  Section 564(b)(1) of the Act, 21 U.S.C. section 360bbb-3(b)(1), unless the authorization is terminated or revoked sooner.  Performed at [facility], [address] [place], [place] [address]   MRSA Next Gen by PCR, Nasal     Status: None   Collection Time: 11/30/23  6:50 PM   Specimen: Nasal Mucosa; Nasal Swab  Result Value Ref Range Status   MRSA by PCR Next Gen NOT DETECTED NOT DETECTED Final    Comment: (NOTE) The GeneXpert MRSA Assay (FDA approved for NASAL specimens only), is one component of a comprehensive MRSA colonization surveillance program. It is not intended to diagnose MRSA infection nor to guide or monitor treatment for MRSA infections. Test performance is not FDA approved in patients less than 46 years old. Performed at [facility], [address] [place], [place] [address]   Respiratory (~20 pathogens) panel by PCR     Status: None   Collection Time: 12/06/23  6:24 PM   Specimen: Nasopharyngeal Swab; Respiratory  Result Value Ref Range Status   Adenovirus NOT DETECTED NOT DETECTED Final   Coronavirus 229E NOT DETECTED NOT DETECTED Final    Comment: (NOTE) The Coronavirus on the Respiratory Panel, DOES NOT test for the novel  Coronavirus (2019 nCoV)    Coronavirus HKU1 NOT DETECTED NOT DETECTED Final   Coronavirus NL63 NOT DETECTED NOT DETECTED Final   Coronavirus OC43 NOT DETECTED NOT DETECTED Final   Metapneumovirus NOT DETECTED NOT DETECTED Final   Rhinovirus / Enterovirus NOT DETECTED NOT DETECTED Final   Influenza A NOT DETECTED NOT DETECTED Final   Influenza B NOT DETECTED NOT DETECTED Final   Parainfluenza Virus 1 NOT DETECTED NOT DETECTED Final   Parainfluenza Virus 2 NOT DETECTED NOT DETECTED Final   Parainfluenza Virus 3 NOT DETECTED NOT DETECTED Final   Parainfluenza Virus 4 NOT DETECTED NOT DETECTED Final   Respiratory Syncytial Virus NOT DETECTED NOT DETECTED Final   Bordetella pertussis NOT DETECTED NOT DETECTED Final   Bordetella  Parapertussis NOT DETECTED NOT DETECTED Final   Chlamydophila pneumoniae NOT DETECTED NOT DETECTED Final   Mycoplasma pneumoniae NOT DETECTED NOT DETECTED Final    Comment: Performed at [facility], [address], [place], [place] [address]    Radiology Studies:  No results found.  Scheduled Meds:    aspirin   81 mg Oral Daily   atorvastatin   20 mg Oral QPM   Chlorhexidine  Gluconate Cloth  6 each Topical Daily   docusate  100 mg Oral Daily   enoxaparin  (LOVENOX ) injection  40 mg Subcutaneous Q24H   esomeprazole   20 mg Oral QAC breakfast   feeding supplement  237 mL Oral TID BM   feeding supplement (  OSMOLITE 1.5 CAL)  1,000 mL Per Tube Q24H   feeding supplement (PROSource TF20)  60 mL Per Tube BID   free water   150 mL Per Tube Q4H   insulin  aspart  0-6 Units Subcutaneous TID WC   ipratropium-albuterol   3 mL Nebulization Q6H WA   levothyroxine   50 mcg Oral Daily   mouth rinse  15 mL Mouth Rinse 4 times per day   ticagrelor   90 mg Oral BID    Continuous Infusions:     LOS: 10 days     Trenda Mar, MD,  FACP, [facility], [facility], [facility]   [facility] [place]      To contact the attending provider between 7A-7P or the covering provider during after hours 7P-7A, please log into the web site [email or web address] and access using universal  password for that web site. If you do not have the password, please call the hospital operator.  12/10/2023, 1:44 PM

## 2023-12-10 NOTE — Progress Notes (Signed)
 STROKE TEAM PROGRESS NOTE   [name] is a 63 y.o. male with past medical history significant for seizures, hypothyroidism, GERD who presented to [facility], ED 12/30 with persistent right gaze deviation and left hemiplegia.  His last known well was 1245 family did call EMS when his symptoms started.  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  Patient was given TNK at [facility] and was transferred to [facility] for emergent thrombectomy. On neurologic neurology exam on arrival to Ian Wong patient still had persistent left-sided weakness, right gaze, left-sided sensory deficit, left hemianopsia, left sensory neglect. He was then taken emergently to IR. NIH 18 on admit  SIGNIFICANT Wong EVENTS 12/30: Transferred from [facility] to [facility] for emergent thrombectomy             Mechanical thrombectomy, right carotid stent and angioplasty  --> TICI 3             Left intubated postprocedure             Post procedure CT showed no hemorrhage. 12/31: MRI shows extensive acute right MCA territory infarcts with chronic microhemorrhages versus acute petechial hemorrhages.  1/1: Remains in ICU. Ongoing Neo for BP support. SLP NPO except for meds. 1/2: Hypertonic saline discontinued 1/3: Coretrak placed 1/7: diet advanced to nectar thick - change TF to nocturnal TF 1/9: open case for CIR insurance auth  INTERVAL HISTORY No family is at the bedside. Pt lying in bed, neuro and respiratory stable. Encourage po intake. Discussed with dietitian and will do calorie count.    Vitals:   12/10/23 0818 12/10/23 1222 12/10/23 1513 12/10/23 1530  BP:  (!) 168/93 (!) 156/81   Pulse:  85 71   Resp:   (!) 22   Temp:  98.4 F (36.9 C) 99.3 F (37.4 C)   TempSrc:  Oral Oral   SpO2: 97% 96% 95% 94%  Weight:      Height:       CBC:  Recent Labs  Lab 12/08/23 0527 12/09/23 0617  WBC 10.9* 10.9*  HGB 14.1 14.1  HCT 40.2 41.0  MCV 88.2  88.9  PLT 164 213   Basic Metabolic Panel:  Recent Labs  Lab 12/04/23 0856 12/05/23 0441 12/08/23 0527 12/09/23 0617  NA 142   < > 138 136  K 4.0   < > 4.2 4.1  CL 115*   < > 107 103  CO2 19*   < > 22 22  GLUCOSE 84   < > 114* 149*  BUN 16   < > 29* 31*  CREATININE 0.93   < > 0.86 1.01  CALCIUM  8.3*   < > 8.4* 8.4*  MG 2.2  --   --   --    < > = values in this interval not displayed.   Lipid Panel:  No results for input(s): CHOL, TRIG, HDL, CHOLHDL, VLDL, LDLCALC in the last 168 hours.  HgbA1c:  No results for input(s): HGBA1C in the last 168 hours.  Urine Drug Screen:  Recent Labs  Lab 12/04/23 2355  LABOPIA NONE DETECTED  COCAINSCRNUR NONE DETECTED  LABBENZ NONE DETECTED  AMPHETMU NONE DETECTED  THCU NONE DETECTED  LABBARB NONE DETECTED    Alcohol Level  No results for input(s): ETH in the last 168 hours.   IMAGING past 24 hours No results found.    PHYSICAL EXAM Temp:  [97.8 F (36.6  C)-99.3 F (37.4 C)] 99.3 F (37.4 C) (01/09 1513) Pulse Rate:  [71-85] 71 (01/09 1513) Resp:  [18-22] 22 (01/09 1513) BP: (139-168)/(73-93) 156/81 (01/09 1513) SpO2:  [94 %-97 %] 94 % (01/09 1530)  General - Well nourished, well developed middle-age Caucasian male, in no apparent distress. HEENT- atraumatic, normocephalic  Cardiovascular - Regular rhythm and rate. Pulmonary- equal chest rise; unlabored respirations. Bilateral rhonchi and continued wheezing.   Neuro: Mental Status: Patient is awake, alert, oriented to person, place, month, year, and situation.continues to follow all commands.  Patient is able to give a clear and coherent history with moderate dysarthria. No aphasia.  No signs of aphasia or neglect Cranial Nerves: II: Visual Fields are full. Pupils are equal, round, and reactive to light.   III,IV, VI: o gaze palsy, tracking examiner bilaterally  V: Facial sensation is symmetric to light touch CPP:Mphyu facial droop VIII: hearing  is intact to voice X: Uvula elevates symmetrically XI: Shoulder shrug is symmetric. XII: tongue is midline  Motor: Tone is normal. Bulk is normal.  RUE: 4/5, no drift LUE: Increased movement seen, 3/5. Lateral movement greater than vertical. Continue soft brace.  BLE: No drift present antigravity strength.  Sensory: Sensation is symmetric to light touch in the arms and legs. Cerebellar: FNF intact on RUE, no ataxia seen. Gait:  Deferred   ASSESSMENT/PLAN [name] is a 63 y.o. male with history of GERD, Hypothyroid, and seizures presenting with acute stroke.   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  S/p IR with TICI3 and right CAS MRI: moderate acute right MCA territory infarcts, chronic microhemorrhages versus acute petechial hemorrhages seen along the right basal ganglia. Chronic lacunar infarct within the left thalamus CT repeat stable infarct  2D Echo: LVEF 50 to 55% LDL 57 HgbA1c 5.9 UDS neg VTE prophylaxis - lovenox  aspirin  81 mg daily prior to admission, continue aspirin  81 mg daily and Brilinta  (ticagrelor ) 90 mg bid for carotid stenting Therapy recommendations: CIR   Disposition:  pending   Essential Hypertension Hypotension, resolved Home meds:  none Stable now SBP goal above 110 Long term BP goal normotensive   COPD exacebation Respiratory distress Exertional wheezing, improving On prednisone  taper and nebs PRN Completed unasyn  x 5 days course Improving exertional wheezing On 1-2L O2 Wong service on board  Appreciate assistance  Hyperlipidemia Home meds: Lipitor 20 mg, resumed in Wong LDL 57, goal < 70 Continue  statin at discharge   Dysphagia Patient has post-stroke dysphagia, SLP consulted Now on Dysphagia level 1 -> 2, and honey thick -> nectar thick liquids Decreased appetite and put on ensures Change TF to nocturnal feeding to boost po intake Coretrak placed 1/3 Dietitian on board On calorie count   Other Stroke Risk Factors Former Cigarette Smoker   Other Active Problems Hypothyroidism Hx of remote seizure at young age, no seizures for a long time, no AED at home  Wong day # 10   Ary Cummins, MD PhD Stroke Neurology 12/10/2023 6:20 PM

## 2023-12-10 NOTE — Plan of Care (Signed)
   Problem: Education: Goal: Knowledge of General Education information will improve Description: Including pain rating scale, medication(s)/side effects and non-pharmacologic comfort measures Outcome: Progressing   Problem: Activity: Goal: Risk for activity intolerance will decrease Outcome: Progressing   Problem: Nutrition: Goal: Adequate nutrition will be maintained Outcome: Progressing   Problem: Coping: Goal: Level of anxiety will decrease Outcome: Progressing

## 2023-12-10 NOTE — Progress Notes (Signed)
 Physical Therapy Treatment Patient Details Name: Ian Wong MRN: [id number] DOB: October 23, 1961 Today's Date: 12/10/2023   History of Present Illness 63 yo male transfer from [facility] with L side weakness and R gaze deviation CT (+) R MCA 12/31 thrombectomy  extubated 12/31 PMH GERD hypothyroidism    PT Comments  Pt resting in bed on arrival and agreeable to session with steady progress towards acute goals. Pt continues to demonstrate inattention to L hemi-body and L environment as well as R gaze preference needing cues throughout session for increased attention. Pt continues to require up to mod A during gait to facilitate midline posture and maintain L hand grip on RW. Pt progressing gait distance slightly with distance limited to pt stated tolerance due to low back discomfort from previous bed positioning. Current plan remains appropriate to address deficits and maximize functional independence and decrease caregiver burden. Pt continues to benefit from skilled PT services to progress toward functional mobility goals.     If plan is discharge home, recommend the following: A lot of help with bathing/dressing/bathroom;Assistance with cooking/housework;Assist for transportation;Help with stairs or ramp for entrance;Two people to help with walking and/or transfers;Direct supervision/assist for medications management;Direct supervision/assist for financial management   Can travel by private vehicle        Equipment Recommendations  Other (comment)    Recommendations for Other Services       Precautions / Restrictions Precautions Precautions: Fall Precaution Comments: L inattention with some improvement Restrictions Weight Bearing Restrictions Per Provider Order: No     Mobility  Bed Mobility Overal bed mobility: Needs Assistance Bed Mobility: Supine to Sit, Sit to Supine     Supine to sit: Contact guard, HOB elevated, Used rails Sit to supine: Contact guard assist   General bed  mobility comments: cues for sequencing and to utilize bedrail, increased time to complete but no physcal assist needed    Transfers Overall transfer level: Needs assistance Equipment used: Rolling walker (2 wheels) Transfers: Sit to/from Stand, Bed to chair/wheelchair/BSC Sit to Stand: Min assist           General transfer comment: min A to place L hand on RW once standing    Ambulation/Gait Ambulation/Gait assistance: Mod assist, Min assist Gait Distance (Feet): 18 Feet (+ 105') Assistive device: Rolling walker (2 wheels) Gait Pattern/deviations: Step-to pattern, Decreased step length - right, Decreased step length - left, Decreased stride length, Decreased weight shift to left, Decreased weight shift to right, Trunk flexed, Knee flexed in stance - left Gait velocity: reduced     General Gait Details: asssit to keep L hand on RW and attend to L environment as pt bumping obstacles and with poor correction of path. mod A to correct L lateral lean with fatigue, cues for forward gaze and upright trunk throughout with pt able to correct but unable to maintain, cues to center self in RW   Stairs             Wheelchair Mobility     Tilt Bed    Modified Rankin (Stroke Patients Only) Modified Rankin (Stroke Patients Only) Pre-Morbid Rankin Score: No symptoms Modified Rankin: Severe disability     Balance Overall balance assessment: Needs assistance Sitting-balance support: Single extremity supported, Feet supported Sitting balance-Leahy Scale: Fair Sitting balance - Comments: Pt able to find and hold midline for extend periods of time with UE's, but without assist, cuing only. Postural control: Left lateral lean Standing balance support: Bilateral upper extremity supported, During functional activity  Standing balance-Leahy Scale: Poor Standing balance comment: min A for standing balance with R UE support, some L lateral lean with fatigue                             Cognition Arousal: Alert Behavior During Therapy: Flat affect Overall Cognitive Status: Impaired/Different from baseline Area of Impairment: Attention, Safety/judgement, Awareness, Problem solving                   Current Attention Level: Sustained Memory: Decreased short-term memory Following Commands: Follows one step commands with increased time Safety/Judgement: Decreased awareness of safety, Decreased awareness of deficits Awareness: Emergent Problem Solving: Slow processing, Decreased initiation, Difficulty sequencing, Requires verbal cues General Comments: increased cues to attend to L        Exercises      General Comments        Pertinent Vitals/Pain Pain Assessment Pain Assessment: Faces Faces Pain Scale: Hurts little more Pain Location: lower back from bed positioning Pain Descriptors / Indicators: Discomfort Pain Intervention(s): Monitored during session, Limited activity within patient's tolerance, Repositioned    Home Living                          Prior Function            PT Goals (current goals can now be found in the care plan section) Acute Rehab PT Goals PT Goal Formulation: With patient/family Time For Goal Achievement: 12/16/23 Progress towards PT goals: Progressing toward goals    Frequency    Min 4X/week      PT Plan      Co-evaluation              AM-PAC PT 6 Clicks Mobility   Outcome Measure  Help needed turning from your back to your side while in a flat bed without using bedrails?: A Lot Help needed moving from lying on your back to sitting on the side of a flat bed without using bedrails?: A Lot Help needed moving to and from a bed to a chair (including a wheelchair)?: A Little Help needed standing up from a chair using your arms (e.g., wheelchair or bedside chair)?: A Little Help needed to walk in hospital room?: A Lot Help needed climbing 3-5 steps with a railing? : Total 6 Click  Score: 13    End of Session Equipment Utilized During Treatment: Gait belt Activity Tolerance: Patient tolerated treatment well Patient left: with call bell/phone within reach;in bed;with bed alarm set Nurse Communication: Mobility status PT Visit Diagnosis: Other abnormalities of gait and mobility (R26.89);Other symptoms and signs involving the nervous system (R29.898) Hemiplegia - Right/Left: Left Hemiplegia - dominant/non-dominant: Non-dominant Hemiplegia - caused by: Other cerebrovascular disease     Time: 8671-8647 PT Time Calculation (min) (ACUTE ONLY): 24 min  Charges:    $Gait Training: 8-22 mins $Therapeutic Activity: 8-22 mins PT General Charges $$ ACUTE PT VISIT: 1 Visit                     Kasmira Cacioppo R. PTA Acute Rehabilitation Services Office: [phone]   Therisa CHRISTELLA Boor 12/10/2023, 2:20 PM

## 2023-12-10 NOTE — Progress Notes (Addendum)
 IP rehab admissions - I called patient's wife and she is thinking about a rehab close to [facility].  She will discuss CIR versus rehab in [place] and let me know tomorrow.  We would then need insurance authorization.  [phone]  Wife called back and is not wanting to pursue CIR here at [facility].  We will open the case with insurance carrier and await their decision regarding potential inpatient rehab admission.  [phone]

## 2023-12-10 NOTE — Progress Notes (Signed)
 Speech Language Pathology Treatment: Dysphagia;Cognitive-Linquistic  Patient Details Name: Ian Wong MRN: [id number] DOB: 07/29/1961 Today's Date: 12/10/2023 Time: 8858-8797 SLP Time Calculation (min) (ACUTE ONLY): 21 min  Assessment / Plan / Recommendation Clinical Impression  Pt was seen during lunch meal. His affect remains flat, but he did show a little improved initiation with tasks by making verbal requests for set up of his meal tray. Initiation and sustained attention were still noted to be reduced during actual self-feeding though. He did follow one-step commands from SLP consistently to take bites though. He says he does not like the pureed foods, but even when given more solid trials (or with non-eating tasks) he generally is needing cues to engage. With more solid trials he had L pocketing for which he needed cues to be made aware of, but then he can clear the residuals completely with a lingual sweep and/or liquid wash. Recommend advancing to Dys 2 diet, keeping with nectar thick liquids, to try to further increase intake.    HPI HPI: 63 yo male transfer from [facility] with L side weakness and R gaze deviation s/p TNK and thrombectomy 12/30. There was some concern for aspiration after vomiting episode during intubation so was kept on vent until 12/31. 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 Repeate CT 1/1 due to decreased wakefulness showed increasing mass effect on the R lateral ventricle.  PMH includes: GERD, hypothyroidism, seizures. MBS completed on 12/04/23 recommending Dys 1 honey thick liquids. Repeat MBS to determine readiness to advance.      SLP Plan  Continue with current plan of care      Recommendations for follow up therapy are one component of a multi-disciplinary discharge planning process, led by the attending physician.  Recommendations may be updated based on patient status, additional functional criteria and insurance authorization.     Recommendations  Diet recommendations: Dysphagia 2 (fine chop);Nectar-thick liquid Liquids provided via: Cup;Straw;Teaspoon Medication Administration: Crushed with puree Supervision: Staff to assist with self feeding;Full supervision/cueing for compensatory strategies Compensations: Minimize environmental distractions;Slow rate;Small sips/bites;Other (Comment);Lingual sweep for clearance of pocketing (one bite or sip at a time) Postural Changes and/or Swallow Maneuvers: Seated upright 90 degrees;Upright 30-60 min after meal                  Oral care BID   Frequent or constant Supervision/Assistance Dysphagia, oropharyngeal phase (R13.12)     Continue with current plan of care     Leita SAILOR., M.A. CCC-SLP Acute Rehabilitation Services Office [phone]  Secure chat preferred   12/10/2023, 1:51 PM

## 2023-12-10 NOTE — Plan of Care (Signed)
  Problem: Education: Goal: Knowledge of General Education information will improve Description: Including pain rating scale, medication(s)/side effects and non-pharmacologic comfort measures Outcome: Progressing   Problem: Activity: Goal: Risk for activity intolerance will decrease 12/10/2023 0402 by Georjean Delene SQUIBB, RN Outcome: Progressing 12/10/2023 0306 by Georjean Delene SQUIBB, RN Outcome: Progressing   Problem: Nutrition: Goal: Adequate nutrition will be maintained 12/10/2023 0402 by Georjean Delene SQUIBB, RN Outcome: Progressing 12/10/2023 0306 by Georjean Delene SQUIBB, RN Outcome: Progressing   Problem: Coping: Goal: Level of anxiety will decrease 12/10/2023 0402 by Georjean Delene SQUIBB, RN Outcome: Progressing 12/10/2023 0306 by Georjean Delene SQUIBB, RN Outcome: Progressing   Problem: Elimination: Goal: Will not experience complications related to bowel motility Outcome: Progressing

## 2023-12-10 NOTE — Progress Notes (Signed)
 OT Cancellation Note  Patient Details Name: Ian Wong MRN: [id number] DOB: 31-May-1961   Cancelled Treatment:    Reason Eval/Treat Not Completed: Other (comment) (pt asleep on arrival, politely declined despite encouragement. OT to f/u when able.)  Lucie JONETTA Kendall 12/10/2023, 3:36 PM

## 2023-12-11 ENCOUNTER — Inpatient Hospital Stay (HOSPITAL_COMMUNITY)
Admission: AD | Admit: 2023-12-11 | Discharge: 2023-12-31 | DRG: 057 | Disposition: A | Discharge status: Home Health | Payer: Medicaid Other | Source: Intra-hospital | Attending: Physical Medicine & Rehabilitation | Admitting: Physical Medicine & Rehabilitation

## 2023-12-11 DIAGNOSIS — F329 Major depressive disorder, single episode, unspecified: Secondary | ICD-10-CM | POA: Diagnosis not present

## 2023-12-11 DIAGNOSIS — E871 Hypo-osmolality and hyponatremia: Secondary | ICD-10-CM | POA: Diagnosis present

## 2023-12-11 DIAGNOSIS — E039 Hypothyroidism, unspecified: Secondary | ICD-10-CM | POA: Diagnosis present

## 2023-12-11 DIAGNOSIS — Z7902 Long term (current) use of antithrombotics/antiplatelets: Secondary | ICD-10-CM | POA: Diagnosis not present

## 2023-12-11 DIAGNOSIS — R1310 Dysphagia, unspecified: Secondary | ICD-10-CM | POA: Diagnosis present

## 2023-12-11 DIAGNOSIS — I639 Cerebral infarction, unspecified: Secondary | ICD-10-CM | POA: Diagnosis not present

## 2023-12-11 DIAGNOSIS — G8114 Spastic hemiplegia affecting left nondominant side: Secondary | ICD-10-CM | POA: Diagnosis not present

## 2023-12-11 DIAGNOSIS — Z79890 Hormone replacement therapy (postmenopausal): Secondary | ICD-10-CM | POA: Diagnosis not present

## 2023-12-11 DIAGNOSIS — R030 Elevated blood-pressure reading, without diagnosis of hypertension: Secondary | ICD-10-CM | POA: Diagnosis not present

## 2023-12-11 DIAGNOSIS — Z6828 Body mass index (BMI) 28.0-28.9, adult: Secondary | ICD-10-CM | POA: Diagnosis not present

## 2023-12-11 DIAGNOSIS — I69354 Hemiplegia and hemiparesis following cerebral infarction affecting left non-dominant side: Secondary | ICD-10-CM | POA: Diagnosis present

## 2023-12-11 DIAGNOSIS — J441 Chronic obstructive pulmonary disease with (acute) exacerbation: Secondary | ICD-10-CM | POA: Diagnosis present

## 2023-12-11 DIAGNOSIS — J9811 Atelectasis: Secondary | ICD-10-CM | POA: Diagnosis present

## 2023-12-11 DIAGNOSIS — E785 Hyperlipidemia, unspecified: Secondary | ICD-10-CM | POA: Diagnosis present

## 2023-12-11 DIAGNOSIS — Z87891 Personal history of nicotine dependence: Secondary | ICD-10-CM

## 2023-12-11 DIAGNOSIS — Z825 Family history of asthma and other chronic lower respiratory diseases: Secondary | ICD-10-CM

## 2023-12-11 DIAGNOSIS — Z7982 Long term (current) use of aspirin: Secondary | ICD-10-CM

## 2023-12-11 DIAGNOSIS — I10 Essential (primary) hypertension: Secondary | ICD-10-CM | POA: Diagnosis not present

## 2023-12-11 DIAGNOSIS — K219 Gastro-esophageal reflux disease without esophagitis: Secondary | ICD-10-CM | POA: Diagnosis present

## 2023-12-11 DIAGNOSIS — I69391 Dysphagia following cerebral infarction: Secondary | ICD-10-CM | POA: Diagnosis not present

## 2023-12-11 DIAGNOSIS — H53462 Homonymous bilateral field defects, left side: Secondary | ICD-10-CM | POA: Diagnosis present

## 2023-12-11 DIAGNOSIS — I69322 Dysarthria following cerebral infarction: Secondary | ICD-10-CM

## 2023-12-11 DIAGNOSIS — Z79899 Other long term (current) drug therapy: Secondary | ICD-10-CM

## 2023-12-11 DIAGNOSIS — R739 Hyperglycemia, unspecified: Secondary | ICD-10-CM | POA: Diagnosis not present

## 2023-12-11 DIAGNOSIS — I63511 Cerebral infarction due to unspecified occlusion or stenosis of right middle cerebral artery: Principal | ICD-10-CM | POA: Diagnosis present

## 2023-12-11 DIAGNOSIS — R630 Anorexia: Secondary | ICD-10-CM | POA: Diagnosis not present

## 2023-12-11 DIAGNOSIS — K5901 Slow transit constipation: Secondary | ICD-10-CM | POA: Diagnosis not present

## 2023-12-11 LAB — CBC
HCT: 43.9 % (ref 39.0–52.0)
Hemoglobin: 15.4 g/dL (ref 13.0–17.0)
MCH: 30.9 pg (ref 26.0–34.0)
MCHC: 35.1 g/dL (ref 30.0–36.0)
MCV: 88 fL (ref 80.0–100.0)
Platelets: 285 10*3/uL (ref 150–400)
RBC: 4.99 MIL/uL (ref 4.22–5.81)
RDW: 13.3 % (ref 11.5–15.5)
WBC: 13.1 10*3/uL — ABNORMAL HIGH (ref 4.0–10.5)
nRBC: 0 % (ref 0.0–0.2)

## 2023-12-11 LAB — BASIC METABOLIC PANEL
Anion gap: 10 (ref 5–15)
BUN: 28 mg/dL — ABNORMAL HIGH (ref 8–23)
CO2: 21 mmol/L — ABNORMAL LOW (ref 22–32)
Calcium: 8.5 mg/dL — ABNORMAL LOW (ref 8.9–10.3)
Chloride: 102 mmol/L (ref 98–111)
Creatinine, Ser: 0.82 mg/dL (ref 0.61–1.24)
GFR, Estimated: >60 mL/min (ref >60)
Glucose, Bld: 156 mg/dL — ABNORMAL HIGH (ref 70–99)
Potassium: 4 mmol/L (ref 3.5–5.1)
Sodium: 133 mmol/L — ABNORMAL LOW (ref 135–145)

## 2023-12-11 LAB — GLUCOSE, CAPILLARY
Glucose-Capillary: 169 mg/dL — ABNORMAL HIGH (ref 70–99)
Glucose-Capillary: 194 mg/dL — ABNORMAL HIGH (ref 70–99)
Glucose-Capillary: 138 mg/dL — ABNORMAL HIGH (ref 70–99)

## 2023-12-11 MED ORDER — ASPIRIN 81 MG PO CHEW: 81.0000 mg | CHEWABLE_TABLET | Freq: Every day | ORAL | Status: AC

## 2023-12-11 MED ORDER — ESOMEPRAZOLE MAGNESIUM 20 MG PO CPDR
20.0000 mg | DELAYED_RELEASE_CAPSULE | Freq: Every day | ORAL | Status: DC
  Administered 2023-12-12 – 2023-12-31 (×20): 20 mg via ORAL
  Filled 2023-12-11 (×20): qty 1

## 2023-12-11 MED ORDER — FREE WATER: 150.0000 mL | Status: DC

## 2023-12-11 MED ORDER — ENSURE ENLIVE PO LIQD
237.0000 mL | Freq: Three times a day (TID) | ORAL | Status: DC
Start: 2023-12-11 — End: 2023-12-31
  Administered 2023-12-12 – 2023-12-29 (×29): 237 mL via ORAL

## 2023-12-11 MED ORDER — SENNOSIDES-DOCUSATE SODIUM 8.6-50 MG PO TABS: 1.0000 | ORAL_TABLET | Freq: Every evening | ORAL | Status: DC | PRN

## 2023-12-11 MED ORDER — ATORVASTATIN CALCIUM 10 MG PO TABS
20.0000 mg | ORAL_TABLET | Freq: Every evening | ORAL | Status: DC
  Administered 2023-12-11 – 2023-12-30 (×20): 20 mg via ORAL
  Filled 2023-12-11 (×21): qty 2

## 2023-12-11 MED ORDER — IPRATROPIUM-ALBUTEROL 0.5-2.5 (3) MG/3ML IN SOLN
3.0000 mL | Freq: Four times a day (QID) | RESPIRATORY_TRACT | Status: DC
  Administered 2023-12-11 – 2023-12-14 (×11): 3 mL via RESPIRATORY_TRACT
  Filled 2023-12-11 (×10): qty 3

## 2023-12-11 MED ORDER — ENSURE ENLIVE PO LIQD: 237.0000 mL | Freq: Three times a day (TID) | ORAL | Status: DC

## 2023-12-11 MED ORDER — PROSOURCE TF20 ENFIT COMPATIBL EN LIQD
60.0000 mL | Freq: Two times a day (BID) | ENTERAL | Status: DC
  Administered 2023-12-11 – 2023-12-14 (×6): 60 mL
  Filled 2023-12-11 (×6): qty 60

## 2023-12-11 MED ORDER — ENOXAPARIN SODIUM 40 MG/0.4ML IJ SOSY: 40.0000 mg | PREFILLED_SYRINGE | INTRAMUSCULAR | Status: DC

## 2023-12-11 MED ORDER — INSULIN ASPART 100 UNIT/ML IJ SOLN
0.0000 [IU] | Freq: Three times a day (TID) | INTRAMUSCULAR | Status: DC
  Administered 2023-12-12: 1 [IU] via SUBCUTANEOUS
  Administered 2023-12-12: 2 [IU] via SUBCUTANEOUS
  Administered 2023-12-13 – 2023-12-20 (×7): 1 [IU] via SUBCUTANEOUS

## 2023-12-11 MED ORDER — PROSOURCE TF20 ENFIT COMPATIBL EN LIQD: 60.0000 mL | Freq: Two times a day (BID) | ENTERAL | Status: DC

## 2023-12-11 MED ORDER — CHLORHEXIDINE GLUCONATE CLOTH 2 % EX PADS: 6.0000 | MEDICATED_PAD | Freq: Every day | CUTANEOUS | Status: DC

## 2023-12-11 MED ORDER — IPRATROPIUM-ALBUTEROL 0.5-2.5 (3) MG/3ML IN SOLN: 3.0000 mL | Freq: Four times a day (QID) | RESPIRATORY_TRACT | Status: DC

## 2023-12-11 MED ORDER — TICAGRELOR 90 MG PO TABS: 90.0000 mg | ORAL_TABLET | Freq: Two times a day (BID) | ORAL | Status: DC

## 2023-12-11 MED ORDER — INSULIN ASPART 100 UNIT/ML IJ SOLN: 0.0000 [IU] | Freq: Three times a day (TID) | INTRAMUSCULAR | Status: DC

## 2023-12-11 MED ORDER — ORAL CARE MOUTH RINSE
15.0000 mL | OROMUCOSAL | Status: DC
  Administered 2023-12-11 – 2023-12-31 (×66): 15 mL via OROMUCOSAL

## 2023-12-11 MED ORDER — OSMOLITE 1.5 CAL PO LIQD: 1000.0000 mL | ORAL | Status: DC

## 2023-12-11 MED ORDER — OSMOLITE 1.5 CAL PO LIQD
1000.0000 mL | ORAL | Status: DC
  Administered 2023-12-11 – 2023-12-12 (×2): 1000 mL
  Filled 2023-12-11 (×3): qty 1000

## 2023-12-11 MED ORDER — ASPIRIN 81 MG PO CHEW
81.0000 mg | CHEWABLE_TABLET | Freq: Every day | ORAL | Status: DC
  Administered 2023-12-12 – 2023-12-31 (×20): 81 mg via ORAL
  Filled 2023-12-11 (×20): qty 1

## 2023-12-11 MED ORDER — ESOMEPRAZOLE MAGNESIUM 20 MG PO CPDR: 20.0000 mg | DELAYED_RELEASE_CAPSULE | Freq: Every day | ORAL | Status: DC

## 2023-12-11 MED ORDER — PANTOPRAZOLE SODIUM 40 MG PO TBEC: 40.0000 mg | DELAYED_RELEASE_TABLET | Freq: Every day | ORAL | Status: DC

## 2023-12-11 MED ORDER — ACETAMINOPHEN 160 MG/5ML PO SOLN
650.0000 mg | ORAL | Status: DC | PRN
  Filled 2023-12-11: qty 20.3

## 2023-12-11 MED ORDER — LEVOTHYROXINE SODIUM 50 MCG PO TABS
50.0000 ug | ORAL_TABLET | Freq: Every day | ORAL | Status: DC
  Administered 2023-12-12 – 2023-12-31 (×20): 50 ug via ORAL
  Filled 2023-12-11 (×20): qty 1

## 2023-12-11 MED ORDER — PREDNISONE 10 MG PO TABS: ORAL_TABLET | ORAL | Status: DC

## 2023-12-11 MED ORDER — CALCIUM CARBONATE ANTACID 500 MG PO CHEW
1.0000 | CHEWABLE_TABLET | Freq: Two times a day (BID) | ORAL | Status: DC | PRN
  Administered 2023-12-11 – 2023-12-30 (×28): 200 mg via ORAL
  Filled 2023-12-11 (×30): qty 1

## 2023-12-11 MED ORDER — DOCUSATE SODIUM 50 MG/5ML PO LIQD: 100.0000 mg | Freq: Every day | ORAL | Status: DC

## 2023-12-11 MED ORDER — DOCUSATE SODIUM 50 MG/5ML PO LIQD
100.0000 mg | Freq: Every day | ORAL | Status: DC
Start: 2023-12-12 — End: 2023-12-31
  Administered 2023-12-12 – 2023-12-30 (×19): 100 mg via ORAL
  Filled 2023-12-11 (×20): qty 10

## 2023-12-11 MED ORDER — ENOXAPARIN SODIUM 40 MG/0.4ML IJ SOSY
40.0000 mg | PREFILLED_SYRINGE | INTRAMUSCULAR | Status: DC
  Administered 2023-12-12 – 2023-12-29 (×18): 40 mg via SUBCUTANEOUS
  Filled 2023-12-11 (×18): qty 0.4

## 2023-12-11 MED ORDER — FREE WATER
150.0000 mL | Status: DC
  Administered 2023-12-11 – 2023-12-17 (×35): 150 mL

## 2023-12-11 MED ORDER — PREDNISONE 20 MG PO TABS
20.0000 mg | ORAL_TABLET | Freq: Every day | ORAL | Status: AC
  Administered 2023-12-12 – 2023-12-13 (×2): 20 mg via ORAL
  Filled 2023-12-11 (×2): qty 1

## 2023-12-11 MED ORDER — ACETAMINOPHEN 650 MG RE SUPP: 650.0000 mg | RECTAL | Status: DC | PRN

## 2023-12-11 MED ORDER — ORAL CARE MOUTH RINSE: 15.0000 mL | OROMUCOSAL | Status: DC | PRN

## 2023-12-11 MED ORDER — ONDANSETRON HCL 4 MG/2ML IJ SOLN: 4.0000 mg | Freq: Four times a day (QID) | INTRAMUSCULAR | Status: DC | PRN

## 2023-12-11 MED ORDER — PREDNISONE 10 MG PO TABS
10.0000 mg | ORAL_TABLET | Freq: Every day | ORAL | Status: AC
  Administered 2023-12-14 – 2023-12-15 (×2): 10 mg via ORAL
  Filled 2023-12-11 (×2): qty 1

## 2023-12-11 MED ORDER — CALCIUM CARBONATE ANTACID 500 MG PO CHEW: 1.0000 | CHEWABLE_TABLET | Freq: Two times a day (BID) | ORAL | Status: AC | PRN

## 2023-12-11 MED ORDER — TICAGRELOR 90 MG PO TABS
90.0000 mg | ORAL_TABLET | Freq: Two times a day (BID) | ORAL | Status: DC
  Administered 2023-12-11 – 2023-12-31 (×40): 90 mg via ORAL
  Filled 2023-12-11 (×40): qty 1

## 2023-12-11 MED ORDER — ACETAMINOPHEN 325 MG PO TABS
650.0000 mg | ORAL_TABLET | ORAL | Status: DC | PRN
  Administered 2023-12-13 – 2023-12-31 (×5): 650 mg via ORAL
  Filled 2023-12-11 (×5): qty 2

## 2023-12-11 NOTE — Progress Notes (Signed)
 Speech Language Pathology Treatment: Dysphagia;Cognitive-Linquistic  Patient Details Name: Ian Wong MRN: [id number] DOB: 1961-05-01 Today's Date: 12/11/2023 Time: 1007-1030 SLP Time Calculation (min) (ACUTE ONLY): 23 min  Assessment / Plan / Recommendation Clinical Impression  Pt was seen with breakfast tray sitting at his bedside, saying that no one helped him with it. He did acknowledge that staff had been in his room - discussed initiating requests for help when needed. Pt also said he was not able to find his phone - assisted him in finding it on his L side/under his arm. Pt consumed a small amount from his tray, drinking mostly from his supplement. One delayed cough was noted but no other overt s/s of possible aspiration. He seems to have less wheezing today. Cueing was primarily provided during PO trials for L pocketing. Pt verbalized that he could feel the eggs in his L buccal cavity, but reported having difficulty getting them out. SLP provided Mod cues to increase awareness and at the end of the session, did provide a little manual assist with repositioning residue over toward his R side to more adequately clear his oral cavity. Pt will benefit from ongoing SLP f/u at next level of care.    HPI HPI: 63 yo male transfer from [facility] with L side weakness and R gaze deviation s/p TNK and thrombectomy 12/30. There was some concern for aspiration after vomiting episode during intubation so was kept on vent until 12/31. 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 Repeate CT 1/1 due to decreased wakefulness showed increasing mass effect on the R lateral ventricle.  PMH includes: GERD, hypothyroidism, seizures. MBS completed on 12/04/23 recommending Dys 1 honey thick liquids. Repeat MBS to determine readiness to advance.      SLP Plan  Continue with current plan of care      Recommendations for follow up therapy are one component of a multi-disciplinary discharge planning process,  led by the attending physician.  Recommendations may be updated based on patient status, additional functional criteria and insurance authorization.    Recommendations  Diet recommendations: Dysphagia 2 (fine chop);Nectar-thick liquid Liquids provided via: Cup;Straw;Teaspoon Medication Administration: Crushed with puree Supervision: Staff to assist with self feeding;Full supervision/cueing for compensatory strategies Compensations: Minimize environmental distractions;Slow rate;Small sips/bites;Other (Comment);Lingual sweep for clearance of pocketing (one bite or sip at a time) Postural Changes and/or Swallow Maneuvers: Seated upright 90 degrees;Upright 30-60 min after meal                  Oral care BID   Frequent or constant Supervision/Assistance Dysphagia, oropharyngeal phase (R13.12)     Continue with current plan of care     Leita SAILOR., M.A. CCC-SLP Acute Rehabilitation Services Office [phone]  Secure chat preferred   12/11/2023, 12:13 PM

## 2023-12-11 NOTE — H&P (Signed)
 Physical Medicine and Rehabilitation Admission H&P    Chief Complaint  Patient presents with   Code Stroke  : HPI: Ian Wong is a 63 year old right-handed male with history  significant for GERD, hypothyroidism, remote seizure, PAD with bilateral femoral artery bypass 2018, quit smoking 7 years ago.  Per chart review patient lives with spouse.  Mobile home with ramped entrance.  Independent prior to admission.  They do have a son that is disabled.  Presented to [facility] 11/30/2023 with right gaze deviation and left hemiplegia/slurred speech of acute onset.  Cranial CT scan showed hyperdense right M1 MCA concerning for thrombus.  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  Patient did receive TNK at [facility].  CT angiogram of the head and neck occluded right ICA at its origin with nonopacification throughout the neck.  Severe right vertebral artery origin stenosis.  Severe stenosis of the intradural small/nondominant left vertebral artery.  Approximately 70% stenosis of the left ICA origin.  Admission chemistries unremarkable except WBC 14,400, hemoglobin A1c 5.9.  Patient was transferred to [facility] and underwent cerebral angiogram mechanical thrombectomy as well as right carotid stenting and angioplasty 11/30/2023 per Dr.de Macedo Rodriguez.  Follow-up MRI showed fairly extensive acute right middle cerebral artery territory infarcts.  Few nonspecific punctate chronic microhemorrhages scattered elsewhere within the supratentorial brain.  Chronic lacunar infarct within the left thalamus.  Echocardiogram ejection fraction of 50 to 55% no wall motion abnormalities.  Follow-up placed on aspirin  81 mg daily as well as Brilinta  90 mg twice daily for CVA prophylaxis.  Lovenox  for DVT prophylaxis.  Hospital course dysphagia currently on dysphagia #2 nectar thick liquid diet with nasogastric tube feeds for nutritional support.  There was question of COPD exacerbation  respiratory distress some exertional wheezing placed on prednisone  with taper completing a 5-day course of Unasyn .  He was being weaned from nasal cannula oxygen with latest chest x-ray showing mild left basilar atelectasis.  Patient with mild hyponatremia 133 and monitored.  Therapy evaluations completed due to patient decreased functional mobility and left-sided weakness was admitted for a comprehensive rehab program.  Review of Systems  Constitutional:  Negative for chills and fever.  HENT:  Negative for hearing loss.   Eyes:  Negative for blurred vision and double vision.  Respiratory:  Positive for wheezing. Negative for cough.        Shortness of breath with exertion  Cardiovascular:  Negative for chest pain, palpitations and leg swelling.  Gastrointestinal:  Positive for constipation. Negative for heartburn, nausea and vomiting.       GERD  Genitourinary:  Negative for dysuria, flank pain and hematuria.  Musculoskeletal:  Positive for joint pain and myalgias.  Skin:  Negative for rash.  Neurological:  Positive for speech change, seizures, weakness and headaches.  All other systems reviewed and are negative.  Past Medical History:  Diagnosis Date   GERD (gastroesophageal reflux disease)    Hypothyroidism    Seizures (HCC)    Past Surgical History:  Procedure Laterality Date   AORTA - BILATERAL FEMORAL ARTERY BYPASS GRAFT Bilateral 01/07/2017   Procedure: AORTOBIFEMORAL BYPASS GRAFT;  Surgeon: Krystal JULIANNA Doing, MD;  [facility] OR;  Service: Vascular;  Laterality: Bilateral;   I & D EXTREMITY Left 11/28/2014   Procedure: IRRIGATION AND DEBRIDEMENT;  REPAIR OF CRUSH INJURY;  Surgeon: Franky Curia, MD;  Location: [facility] OR;  Service: Orthopedics;  Laterality: Left;   INCISIONAL HERNIA REPAIR N/A 10/26/2017  Procedure: HERNIA REPAIR INCISIONAL WITH MESH;  Surgeon: Mavis Anes, MD;  Location: [facility] ORS;  Service: General;  Laterality: N/A;   IR CT HEAD LTD  11/30/2023   IR INTRAVSC STENT  CERV CAROTID W/EMB-PROT MOD SED INCL ANGIO  11/30/2023   IR PERCUTANEOUS ART THROMBECTOMY/INFUSION INTRACRANIAL INC DIAG ANGIO  11/30/2023   IR US  GUIDE VASC ACCESS RIGHT  11/30/2023   RADIOLOGY WITH ANESTHESIA N/A 11/30/2023   Procedure: IR WITH ANESTHESIA;  Surgeon: Radiologist, Medication, MD;  Location: [facility] OR;  Service: Radiology;  Laterality: N/A;   Family History  Problem Relation Age of Onset   COPD Mother    Other Brother    Social History:  reports that he quit smoking about 7 years ago. His smoking use included cigarettes. He has never used smokeless tobacco. He reports that he does not currently use alcohol. He reports that he does not use drugs. Allergies: No Known Allergies Medications Prior to Admission  Medication Sig Dispense Refill   aspirin  EC 81 MG tablet Take 81 mg by mouth daily.     atorvastatin  (LIPITOR) 20 MG tablet Take 20 mg by mouth every evening.  2   esomeprazole  (NEXIUM  24HR) 20 MG capsule Take 20 mg daily at 12 noon by mouth.     ibuprofen (ADVIL) 800 MG tablet Take 800 mg by mouth every 8 (eight) hours as needed for moderate pain (pain score 4-6).     levothyroxine  (SYNTHROID , LEVOTHROID) 50 MCG tablet Take 50 mcg by mouth daily.  0   Omega-3 Fatty Acids (OMEGA 3 PO) Take 1 g by mouth 2 (two) times daily.        Home: Home Living Family/patient expects to be discharged to:: Private residence Living Arrangements: Spouse/significant other Available Help at Discharge: Friend(s), Available 24 hours/day Type of Home: Mobile home Home Access: Ramped entrance Home Layout: One level Bathroom Shower/Tub: [employer] Accessibility: Yes Home Equipment: None Additional Comments: wife is home with son with physical disability during the day  Lives With: Spouse, [name])   Functional History: Prior Function Prior Level of Function : Independent/Modified Independent, Working/employed, Driving ADLs  Comments: works on [employer] Status:  Mobility: Bed Mobility Overal bed mobility: Needs Assistance Bed Mobility: Supine to Sit, Sit to Supine Rolling: Min assist Supine to sit: Contact guard, HOB elevated, Used rails Sit to supine: Contact guard assist General bed mobility comments: cues for sequencing and to utilize bedrail, increased time to complete but no physcal assist needed Transfers Overall transfer level: Needs assistance Equipment used: Rolling walker (2 wheels) Transfers: Sit to/from Stand, Bed to chair/wheelchair/BSC Sit to Stand: Min assist Bed to/from chair/wheelchair/BSC transfer type:: Step pivot Stand pivot transfers: Contact guard assist Step pivot transfers: Mod assist General transfer comment: min A to place L hand on RW once standing Ambulation/Gait Ambulation/Gait assistance: Mod assist, Min assist Gait Distance (Feet): 18 Feet (+ 105') Assistive device: Rolling walker (2 wheels) Gait Pattern/deviations: Step-to pattern, Decreased step length - right, Decreased step length - left, Decreased stride length, Decreased weight shift to left, Decreased weight shift to right, Trunk flexed, Knee flexed in stance - left General Gait Details: asssit to keep L hand on RW and attend to L environment as pt bumping obstacles and with poor correction of path. mod A to correct L lateral lean with fatigue, cues for forward gaze and upright trunk throughout with pt able to correct but unable to maintain, cues to center self  in RW Gait velocity: reduced Gait velocity interpretation: <1.31 ft/sec, indicative of household ambulator    ADL: ADL Overall ADL's : Needs assistance/impaired Eating/Feeding: NPO Grooming: Minimal assistance, Sitting Grooming Details (indicate cue type and reason): decreased awareness of L side of mouth with drool Upper Body Bathing: Moderate assistance Lower Body Bathing: Maximal assistance Upper Body Dressing : Moderate assistance Lower  Body Dressing: Maximal assistance Toilet Transfer: Minimal assistance, Ambulation, Rolling walker (2 wheels) Toilet Transfer Details (indicate cue type and reason): simulated recliner>bed Toileting - Clothing Manipulation Details (indicate cue type and reason): not aware of incontinence of bowel. pt with total (A) for hygiene. pt was able to sustain standing for the peri care General ADL Comments: ADL participation impacted by LUE hemiparesis  Cognition: Cognition Overall Cognitive Status: Impaired/Different from baseline Arousal/Alertness: Lethargic Orientation Level: Oriented X4 Attention: Sustained Sustained Attention: Impaired Sustained Attention Impairment: Verbal basic, Functional basic Awareness: Impaired Awareness Impairment: Intellectual impairment Problem Solving: Impaired Problem Solving Impairment: Verbal complex Safety/Judgment: Impaired Cognition Arousal: Alert Behavior During Therapy: Flat affect Overall Cognitive Status: Impaired/Different from baseline Area of Impairment: Attention, Safety/judgement, Awareness, Problem solving Current Attention Level: Sustained Memory: Decreased short-term memory Following Commands: Follows one step commands with increased time Safety/Judgement: Decreased awareness of safety, Decreased awareness of deficits Awareness: Emergent Problem Solving: Slow processing, Decreased initiation, Difficulty sequencing, Requires verbal cues General Comments: increased cues to attend to L  Physical Exam: Blood pressure 124/71, pulse 74, temperature 98.5 F (36.9 C), temperature source Oral, resp. rate 17, height 5' 11 (1.803 m), weight 96.8 kg, SpO2 97%. Physical Exam Constitutional:      General: He is not in acute distress. HENT:     Head: Normocephalic.     Right Ear: External ear normal.     Left Ear: External ear normal.     Nose:     Comments: NGT Eyes:     Conjunctiva/sclera: Conjunctivae normal.     Pupils: Pupils are equal,  round, and reactive to light.  Cardiovascular:     Rate and Rhythm: Normal rate and regular rhythm.     Heart sounds: No murmur heard.    No gallop.  Pulmonary:     Effort: Pulmonary effort is normal. No respiratory distress.     Breath sounds: No wheezing.  Abdominal:     General: Bowel sounds are normal. There is no distension.     Palpations: Abdomen is soft.     Tenderness: There is no abdominal tenderness.  Genitourinary:    Penis: Normal.   Musculoskeletal:        General: No swelling or tenderness.     Cervical back: Normal range of motion.  Skin:    General: Skin is warm and dry.  Neurological:     Mental Status: He is alert.     Comments: Pt is alert. Abulic. Oriented to person, place, month/year and reason. Pt demonstrates fair insight and awareness. Able to provide some limited biographical information. Right gaze preference. Can come to the left with cueing. Speech with moderate volume but dysarthric. Left central 7. Decreased sensation of left face. 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 MMT: LUE is 2/5 deltoids, biceps, triceps and 2+ left wrist and hand. LLE appears to be 4- to 4/5. Sensation is 1+/2 Left arm and leg. DTR's 3+ left arm and leg. No abnl resting tone.   Psychiatric:     Comments: Flat, slow to engage     Results for orders placed or performed during  the hospital encounter of 11/30/23 (from the past 48 hours)  Glucose, capillary     Status: Abnormal   Collection Time: 12/09/23 12:29 PM  Result Value Ref Range   Glucose-Capillary 149 (H) 70 - 99 mg/dL    Comment: Glucose reference range applies only to samples taken after fasting for at least 8 hours.  Glucose, capillary     Status: Abnormal   Collection Time: 12/09/23  4:45 PM  Result Value Ref Range   Glucose-Capillary 173 (H) 70 - 99 mg/dL    Comment: Glucose reference range applies only to samples taken after fasting for at least 8 hours.  Glucose, capillary     Status: Abnormal    Collection Time: 12/09/23  9:35 PM  Result Value Ref Range   Glucose-Capillary 161 (H) 70 - 99 mg/dL    Comment: Glucose reference range applies only to samples taken after fasting for at least 8 hours.   Comment 1 Notify RN   Glucose, capillary     Status: Abnormal   Collection Time: 12/10/23  6:31 AM  Result Value Ref Range   Glucose-Capillary 224 (H) 70 - 99 mg/dL    Comment: Glucose reference range applies only to samples taken after fasting for at least 8 hours.   Comment 1 Notify RN   Glucose, capillary     Status: Abnormal   Collection Time: 12/10/23 11:57 AM  Result Value Ref Range   Glucose-Capillary 123 (H) 70 - 99 mg/dL    Comment: Glucose reference range applies only to samples taken after fasting for at least 8 hours.  Glucose, capillary     Status: Abnormal   Collection Time: 12/10/23  5:33 PM  Result Value Ref Range   Glucose-Capillary 123 (H) 70 - 99 mg/dL    Comment: Glucose reference range applies only to samples taken after fasting for at least 8 hours.  Glucose, capillary     Status: Abnormal   Collection Time: 12/10/23  9:09 PM  Result Value Ref Range   Glucose-Capillary 225 (H) 70 - 99 mg/dL    Comment: Glucose reference range applies only to samples taken after fasting for at least 8 hours.   Comment 1 Notify RN   Glucose, capillary     Status: Abnormal   Collection Time: 12/11/23  6:37 AM  Result Value Ref Range   Glucose-Capillary 169 (H) 70 - 99 mg/dL    Comment: Glucose reference range applies only to samples taken after fasting for at least 8 hours.   Comment 1 Notify RN   CBC     Status: Abnormal   Collection Time: 12/11/23  7:05 AM  Result Value Ref Range   WBC 13.1 (H) 4.0 - 10.5 K/uL   RBC 4.99 4.22 - 5.81 MIL/uL   Hemoglobin 15.4 13.0 - 17.0 g/dL   HCT 56.0 60.9 - 47.9 %   MCV 88.0 80.0 - 100.0 fL   MCH 30.9 26.0 - 34.0 pg   MCHC 35.1 30.0 - 36.0 g/dL   RDW 86.6 88.4 - 84.4 %   Platelets 285 150 - 400 K/uL   nRBC 0.0 0.0 - 0.2 %     Comment: Performed at [facility], [address], [place], [place] [address]  Basic metabolic panel     Status: Abnormal   Collection Time: 12/11/23  7:05 AM  Result Value Ref Range   Sodium 133 (L) 135 - 145 mmol/L   Potassium 4.0 3.5 - 5.1 mmol/L   Chloride  102 98 - 111 mmol/L   CO2 21 (L) 22 - 32 mmol/L   Glucose, Bld 156 (H) 70 - 99 mg/dL    Comment: Glucose reference range applies only to samples taken after fasting for at least 8 hours.   BUN 28 (H) 8 - 23 mg/dL   Creatinine, Ser 9.17 0.61 - 1.24 mg/dL   Calcium  8.5 (L) 8.9 - 10.3 mg/dL   GFR, Estimated >39 >39 mL/min    Comment: (NOTE) Calculated using the CKD-EPI Creatinine Equation (2021)    Anion gap 10 5 - 15    Comment: Performed at [facility], [address] [place], [place] [address]   No results found.    Blood pressure 124/71, pulse 74, temperature 98.5 F (36.9 C), temperature source Oral, resp. rate 17, height 5' 11 (1.803 m), weight 96.8 kg, SpO2 97%.  Medical Problem List and Plan: 1. Functional deficits secondary to right MCA infarct due to right ICA and MCA occlusion status post TNK NIR with TICI3 and right carotid stent  -patient may shower  -ELOS/Goals: 18-21 days, goals are supervision with PT, OT, SLP 2.  Antithrombotics: -DVT/anticoagulation:  Pharmaceutical: Lovenox   -antiplatelet therapy: Aspirin  81 mg daily and Brilinta  90 mg twice daily 3. Pain Management: Tylenol  as needed 4. Mood/Behavior/Sleep: Provide emotional support  -may be experiencing some early depression. Told me he regrets not taking care of himself earlier in his life  -antipsychotic agents: N/A 5. Neuropsych/cognition: This patient is capable of making decisions on his own behalf. 6. Skin/Wound Care: Routine skin checks 7. Fluids/Electrolytes/Nutrition: Routine in and outs with follow-up chemistries on Monday 8.  Dysphagia.  Dysphagia #2 nectar thick liquids.   -Nasogastric tube for nutritional support.    -Advance per speech therapy 9.  Hyperlipidemia.  Lipitor 10.  GERD.  Nexium  7.  Hypothyroidism.  Synthroid  8.  COPD exacerbation/respiratory distress.  Patient completed 5-day course of Unasyn .  Prednisone  taper.   -pt on room air currently.    Toribio PARAS Angiulli, PA-C 12/11/2023

## 2023-12-11 NOTE — Plan of Care (Signed)
  Problem: Activity: Goal: Risk for activity intolerance will decrease Outcome: Not Progressing   Problem: Safety: Goal: Ability to remain free from injury will improve Outcome: Not Progressing   Problem: Ischemic Stroke/TIA Tissue Perfusion: Goal: Complications of ischemic stroke/TIA will be minimized Outcome: Not Progressing   Problem: Activity: Goal: Ability to return to baseline activity level will improve Outcome: Not Progressing

## 2023-12-11 NOTE — Progress Notes (Signed)
 IP rehab admissions - We have approval for acute inpatient rehab admission from insurance carrier.  Bed available today and will contact attending MD later this am to check for medical readiness.  [phone]

## 2023-12-11 NOTE — Progress Notes (Signed)
 Occupational Therapy Treatment Patient Details Name: Ian Wong MRN: [id number] DOB: 08-10-1961 Today's Date: 12/11/2023   History of present illness 63 yo male transfer from [facility] with L side weakness and R gaze deviation CT (+) R MCA 12/31 thrombectomy  extubated 12/31 PMH GERD hypothyroidism   OT comments  Pt. Seen for skilled OT treatment session.  Able to complete bed mobility with CGA.  Ambulation to b.room with RW with MIN A.  Cues throughout for management and attention to LUE.  Cont. With acute OT POC.        If plan is discharge home, recommend the following:  Two people to help with walking and/or transfers;Two people to help with bathing/dressing/bathroom   Equipment Recommendations  BSC/3in1;Wheelchair (measurements OT);Wheelchair cushion (measurements OT);Hospital bed;Hoyer lift    Recommendations for Other Services      Precautions / Restrictions Precautions Precautions: Fall Precaution Comments: L inattention with some improvement       Mobility Bed Mobility Overal bed mobility: Needs Assistance Bed Mobility: Supine to Sit, Sit to Supine     Supine to sit: Contact guard, HOB elevated, Used rails     General bed mobility comments: cues for management and attention to LUE but no physical assistance needed    Transfers Overall transfer level: Needs assistance Equipment used: Rolling walker (2 wheels) Transfers: Sit to/from Stand, Bed to chair/wheelchair/BSC Sit to Stand: Min assist Stand pivot transfers: Contact guard assist         General transfer comment: min A to place L hand on RW once standing.  L inattention contributing to bumbing into left portion of doorway while ambulating into the b.room.  cues to turn head to L and see door frame. Pt. Able to identify that but required physical assistance to safely move RW as he was initially trying to lift if off of the floor to get around the door frame.       Balance                                            ADL either performed or assessed with clinical judgement   ADL Overall ADL's : Needs assistance/impaired                         Toilet Transfer: Minimal assistance;Ambulation;Rolling walker (2 wheels) Toilet Transfer Details (indicate cue type and reason): eob, in room ambulation to b.room for toileting   Toileting - Clothing Manipulation Details (indicate cue type and reason): not observed-pt. left in b.room with cna or rn to assist once pt. finished            Extremity/Trunk Assessment              Vision       Perception     Praxis      Cognition Arousal: Alert Behavior During Therapy: WFL for tasks assessed/performed Overall Cognitive Status: Impaired/Different from baseline Area of Impairment: Attention, Safety/judgement, Awareness, Problem solving                   Current Attention Level: Sustained Memory: Decreased short-term memory Following Commands: Follows one step commands with increased time Safety/Judgement: Decreased awareness of safety, Decreased awareness of deficits Awareness: Emergent Problem Solving: Slow processing, Decreased initiation, Difficulty sequencing, Requires verbal cues General Comments: increased cues to attend to L  Exercises      Shoulder Instructions       General Comments  Loves talking about his dog. Face lights up when he tells stories about her.      Pertinent Vitals/ Pain       Pain Assessment Pain Assessment: No/denies pain  Home Living                                          Prior Functioning/Environment              Frequency  Min 1X/week        Progress Toward Goals  OT Goals(current goals can now be found in the care plan section)  Progress towards OT goals: Progressing toward goals     Plan      Co-evaluation                 AM-PAC OT 6 Clicks Daily Activity     Outcome Measure   Help from another  person eating meals?: A Little Help from another person taking care of personal grooming?: A Little Help from another person toileting, which includes using toliet, bedpan, or urinal?: A Little Help from another person bathing (including washing, rinsing, drying)?: A Lot Help from another person to put on and taking off regular upper body clothing?: A Little Help from another person to put on and taking off regular lower body clothing?: A Lot 6 Click Score: 16    End of Session Equipment Utilized During Treatment: Gait belt;Rolling walker (2 wheels)  OT Visit Diagnosis: Unsteadiness on feet (R26.81);Muscle weakness (generalized) (M62.81);Hemiplegia and hemiparesis Hemiplegia - Right/Left: Left Hemiplegia - dominant/non-dominant: Non-Dominant Hemiplegia - caused by: Cerebral infarction   Activity Tolerance Patient tolerated treatment well   Patient Left Other (comment) (left in b.room and will pull alert when finished, rn aware)   Nurse Communication Other (comment) (reviewed with RN pt. in b.room and will pull alert when finished)        Time: 8785-8767 OT Time Calculation (min): 18 min  Charges: OT General Charges $OT Visit: 1 Visit OT Treatments $Self Care/Home Management : 8-22 mins  Randall, COTA/L Acute Rehabilitation [phone]   CHRISTELLA Nest Lorraine-COTA/L 12/11/2023, 12:38 PM

## 2023-12-11 NOTE — TOC Transition Note (Signed)
 Transition of Care [facility]) - Discharge Note   Patient Details  Name: Ian Wong MRN: [id number] Date of Birth: Aug 12, 1961  [facility] CM/SW Contact:  Hendricks KANDICE Her, RN Phone Number: 12/11/2023, 10:07 AM   Clinical Narrative:     Patient will DC to [facility] today. No TOC needs identified           Patient Goals and CMS Choice            Discharge Placement                       Discharge Plan and Services Additional resources added to the After Visit Summary for                                       Social Drivers of Health (SDOH) Interventions SDOH Screenings   Tobacco Use: Medium Risk (11/30/2023)     Readmission Risk Interventions     No data to display

## 2023-12-11 NOTE — Progress Notes (Signed)
 Inpatient Rehabilitation Admission Medication Review by a Pharmacist  A complete drug regimen review was completed for this patient to identify any potential clinically significant medication issues.  High Risk Drug Classes Is patient taking? Indication by Medication  Antipsychotic No   Anticoagulant Yes Enoxaparin  - VTE prophlyaxis  Antibiotic No   Opioid No   Antiplatelet Yes Aspirin  81 mg, Ticagrelor  - s/p carotid stent  Hypoglycemics/insulin  Yes SSI - glucose control while on tube feedings  Vasoactive Medication No   Chemotherapy No   Other Yes Atorvastatin  - hyperlipidemia Docusate - laxative Esomeprazole  - GERD Levothyroxine  - hypothyroidism Duonebs - shortness of breath/wheezing/ COPD exacerbation Prednisone  taper for 4 more days - COPD exacerbation  PRNs: Acetaminophen  - mild pain or temp > 99.5 F Calcium  carbonate - indigestion, heartburn Senna-docusate - constipation     Type of Medication Issue Identified Description of Issue Recommendation(s)  Drug Interaction(s) (clinically significant)     Duplicate Therapy     Allergy     No Medication Administration End Date     Incorrect Dose     Additional Drug Therapy Needed     Significant med changes from prior encounter (inform family/care partners about these prior to discharge). New Ticagrelor  Communicate changes with patient/family prior to discharge.  Other  Off PTA Omega 3.  On Esomeprazole  PTA and while inpatient, rather than usual therapeutic substitution with Pantoprazole . Resume at discharge if appropriate.  Esomeprazole  continued on CIR.    Clinically significant medication issues were identified that warrant physician communication and completion of prescribed/recommended actions by midnight of the next day:  No  Pharmacist comments:  - Prednisone  begun1/3/25 and tapering regimen in place. Completes 12/15/23.  Time spent performing this drug regimen review (minutes):  [address]   Ian Wong,  [place] 12/11/2023 3:10 PM

## 2023-12-11 NOTE — Progress Notes (Signed)
 PMR Admission Coordinator Pre-Admission Assessment   Patient: Ian Wong is an 63 y.o., male MRN: [id number] DOB: 12-01-1961 Height: 5' 11 (180.3 cm) Weight: 96.8 kg   Insurance Information HMO:     PPO:      PCP:      IPA:      80/20:      OTHER:  PRIMARY: [facility] medicaid [employer]  Policy [id number] p    Patient - self CM Name: Alejo Bran      Phone#: [phone]     Fax#: [phone] Pre-Cert#: [id number] approved from 12/11/23 to 12/16/23 with update due on 12/16/23      Employer:  Benefits:  Phone #: [phone]     Name: verified on [facility] provider portal Eff. Date: 12/02/23     Deduct: 40      Out of Pocket Max: $0      Life Max: n/a CIR: 100%      SNF: 100% for 90 days Outpatient: 1005 for 27 visit/year combined     Co-Pay: none Home Health: 100% limited to 100 total visits/year      Co-Pay: none DME: 100%     Co-Pay: none Providers: in network    SECONDARY:       Policy#:      Phone#:    Artist:       Phone#:    The Data Processing Manager" for patients in Inpatient Rehabilitation Facilities with attached "Privacy Act Statement-Health Care Records" was provided and verbally reviewed with: Patient   Emergency Contact Information Contact Information       Name Relation Home Work Mobile    [place] Spouse     [phone]         Other Contacts       Name Relation Home Work Mobile    [place] Sister     [phone]    shelton,sherry Sister     [phone]           Current Medical History  Patient Admitting Diagnosis: R MCA CVA   History of Present Illness:  A 63 y.o. male with past medical history significant for seizures, hypothyroidism, GERD who presented to [facility] ED 11/30/23 with persistent right gaze deviation and left hemiplegia.  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  Patient was given TNK at [facility] and was transferred to [facility]  11/30/23 for emergent thrombectomy.  On neurologic exam on arrival to [facility] patient still had persistent left-sided weakness, right gaze, left-sided sensory deficit, left hemianopsia, left sensory neglect.  He was then taken emergently to IR for Mechanical thrombectomy, right carotid stent and angioplasty  --> TICI 3. He was Left intubated postprocedure.Post procedure CT showed no hemorrhage. On 12/01/23 MRI showed extensive acute right MCA territory infarcts with chronic microhemorrhages versus acute petechial hemorrhages.  Coretrak placed 1/3 as SLP recommended  NPO except for meds. Pt. Was seen by PT/OT/SLP and they recommended CIR to assist return to PLOF.      Complete NIHSS TOTAL: 8   Patient's medical record from [facility] has been reviewed by the rehabilitation admission coordinator and physician.   Past Medical History      Past Medical History:  Diagnosis Date   GERD (gastroesophageal reflux disease)     Hypothyroidism     Seizures (HCC)            Has the patient had major surgery  during 100 days prior to admission? Yes   Family History   family history includes COPD in his mother; Other in his brother.   Current Medications  Current Medications    Current Facility-Administered Medications:    acetaminophen  (TYLENOL ) tablet 650 mg, 650 mg, Oral, Q4H PRN, 650 mg at 12/09/23 1540 **OR** acetaminophen  (TYLENOL ) 160 MG/5ML solution 650 mg, 650 mg, Per Tube, Q4H PRN, 650 mg at 12/05/23 1346 **OR** acetaminophen  (TYLENOL ) suppository 650 mg, 650 mg, Rectal, Q4H PRN, Khaliqdina, Salman, MD   aspirin  chewable tablet 81 mg, 81 mg, Oral, Daily, Rosemarie, Pramod S, MD, 81 mg at 12/11/23 9187   atorvastatin  (LIPITOR) tablet 20 mg, 20 mg, Oral, QPM, Sethi, Pramod S, MD, 20 mg at 12/10/23 1717   calcium  carbonate (TUMS - dosed in mg elemental calcium ) chewable tablet 200 mg of elemental calcium , 1 tablet, Oral, BID PRN, Bhagat, Srishti L, MD, 200 mg of elemental  calcium  at 12/10/23 2003   Chlorhexidine  Gluconate Cloth 2 % PADS 6 each, 6 each, Topical, Daily, Byrum, Robert S, MD, 6 each at 12/09/23 0848   docusate (COLACE) 50 MG/5ML liquid 100 mg, 100 mg, Oral, Daily, Byrum, Robert S, MD, 100 mg at 12/11/23 9187   enoxaparin  (LOVENOX ) injection 40 mg, 40 mg, Subcutaneous, Q24H, Rosemarie, Pramod S, MD, 40 mg at 12/10/23 1048   esomeprazole  (NEXIUM ) capsule 20 mg, 20 mg, Oral, QAC breakfast, Ghimire, Kuber, MD, 20 mg at 12/11/23 0643   feeding supplement (ENSURE ENLIVE / ENSURE PLUS) liquid 237 mL, 237 mL, Oral, TID BM, Jerri Pfeiffer, MD, 237 mL at 12/11/23 0813   feeding supplement (OSMOLITE 1.5 CAL) liquid 1,000 mL, 1,000 mL, Per Tube, Q24H, Jerri Pfeiffer, MD, 1,000 mL at 12/10/23 1720   feeding supplement (PROSource TF20) liquid 60 mL, 60 mL, Per Tube, BID, Harold Scholz, MD, 60 mL at 12/11/23 0813   free water  150 mL, 150 mL, Per Tube, Q4H, Jerri Pfeiffer, MD, 150 mL at 12/11/23 0730   insulin  aspart (novoLOG ) injection 0-6 Units, 0-6 Units, Subcutaneous, TID WC, Hongalgi, Anand D, MD, 1 Units at 12/11/23 9357   ipratropium-albuterol  (DUONEB) 0.5-2.5 (3) MG/3ML nebulizer solution 3 mL, 3 mL, Nebulization, Q6H WA, Melvin, Alexander B, MD, 3 mL at 12/11/23 0707   levothyroxine  (SYNTHROID ) tablet 50 mcg, 50 mcg, Oral, Daily, Sethi, Pramod S, MD, 50 mcg at 12/11/23 9356   ondansetron  (ZOFRAN ) injection 4 mg, 4 mg, Intravenous, Q6H PRN, de Macedo Rodrigues, Katyucia, MD, 4 mg at 12/08/23 2233   Oral care mouth rinse, 15 mL, Mouth Rinse, 4 times per day, Harold Scholz, MD, 15 mL at 12/11/23 0730   Oral care mouth rinse, 15 mL, Mouth Rinse, PRN, Chand, Sudham, MD   [COMPLETED] predniSONE  (DELTASONE ) tablet 30 mg, 30 mg, Oral, Q breakfast, 30 mg at 12/11/23 0812 **FOLLOWED BY** [START ON 12/12/2023] predniSONE  (DELTASONE ) tablet 20 mg, 20 mg, Oral, Q breakfast **FOLLOWED BY** [START ON 12/14/2023] predniSONE  (DELTASONE ) tablet 10 mg, 10 mg, Oral, Q breakfast, Hongalgi, Anand D,  MD   senna-docusate (Senokot-S) tablet 1 tablet, 1 tablet, Oral, QHS PRN, Khaliqdina, Salman, MD   ticagrelor  (BRILINTA ) tablet 90 mg, 90 mg, Oral, BID, Sethi, Pramod S, MD, 90 mg at 12/11/23 9187     Patients Current Diet:  Diet Order                  DIET DYS 2 Room service appropriate? Yes with Assist; Fluid consistency: Nectar Thick  Diet effective now  Precautions / Restrictions Precautions Precautions: Fall Precaution Comments: L inattention with some improvement Restrictions Weight Bearing Restrictions Per Provider Order: No    Has the patient had 2 or more falls or a fall with injury in the past year? No   Prior Activity Level Community (5-7x/wk): Pt active in the community PTA   Prior Functional Level Self Care: Did the patient need help bathing, dressing, using the toilet or eating? Independent   Indoor Mobility: Did the patient need assistance with walking from room to room (with or without device)? Independent   Stairs: Did the patient need assistance with internal or external stairs (with or without device)? Independent   Functional Cognition: Did the patient need help planning regular tasks such as shopping or remembering to take medications? Independent   Patient Information Are you of Hispanic, Latino/a,or Spanish origin?: A. No, not of Hispanic, Latino/a, or Spanish origin What is your race?: A. White Do you need or want an interpreter to communicate with a doctor or health care staff?: 0. No   Patient's Response To:  Health Literacy and Transportation Is the patient able to respond to health literacy and transportation needs?: Yes Health Literacy - How often do you need to have someone help you when you read instructions, pamphlets, or other written material from your doctor or pharmacy?: Never In the past 12 months, has lack of transportation kept you from medical appointments or from getting medications?: No In the past 12  months, has lack of transportation kept you from meetings, work, or from getting things needed for daily living?: No   Home Assistive Devices / Equipment Home Equipment: None   Prior Device Use: Indicate devices/aids used by the patient prior to current illness, exacerbation or injury? None of the above   Current Functional Level Cognition   Arousal/Alertness: Lethargic Overall Cognitive Status: Impaired/Different from baseline Current Attention Level: Sustained Orientation Level: Oriented X4 Following Commands: Follows one step commands with increased time Safety/Judgement: Decreased awareness of safety, Decreased awareness of deficits General Comments: increased cues to attend to L Attention: Sustained Sustained Attention: Impaired Sustained Attention Impairment: Verbal basic, Functional basic Awareness: Impaired Awareness Impairment: Intellectual impairment Problem Solving: Impaired Problem Solving Impairment: Verbal complex Safety/Judgment: Impaired    Extremity Assessment (includes Sensation/Coordination)   Upper Extremity Assessment: LUE deficits/detail LUE Deficits / Details: pt able to complete Active shouler flexion to ~ 50*, able to demo uncoordinated hand/mouth pattern, ataxia noted during functioanl reach, gross grasp noted at digits, active movement noted in wrist LUE Sensation: decreased proprioception, decreased light touch LUE Coordination: decreased fine motor, decreased gross motor  Lower Extremity Assessment: Generalized weakness LLE Deficits / Details: Neglect on the L,  strength L quads 3/5, o/w 3-/5 LLE Sensation: decreased light touch LLE Coordination: decreased fine motor     ADLs   Overall ADL's : Needs assistance/impaired Eating/Feeding: NPO Grooming: Minimal assistance, Sitting Grooming Details (indicate cue type and reason): decreased awareness of L side of mouth with drool Upper Body Bathing: Moderate assistance Lower Body Bathing: Maximal  assistance Upper Body Dressing : Moderate assistance Lower Body Dressing: Maximal assistance Toilet Transfer: Minimal assistance, Ambulation, Rolling walker (2 wheels) Toilet Transfer Details (indicate cue type and reason): simulated recliner>bed Toileting - Clothing Manipulation Details (indicate cue type and reason): not aware of incontinence of bowel. pt with total (A) for hygiene. pt was able to sustain standing for the peri care General ADL Comments: ADL participation impacted by LUE hemiparesis     Mobility  Overal bed mobility: Needs Assistance Bed Mobility: Supine to Sit, Sit to Supine Rolling: Min assist Supine to sit: Contact guard, HOB elevated, Used rails Sit to supine: Contact guard assist General bed mobility comments: cues for sequencing and to utilize bedrail, increased time to complete but no physcal assist needed     Transfers   Overall transfer level: Needs assistance Equipment used: Rolling walker (2 wheels) Transfers: Sit to/from Stand, Bed to chair/wheelchair/BSC Sit to Stand: Min assist Bed to/from chair/wheelchair/BSC transfer type:: Step pivot Stand pivot transfers: Contact guard assist Step pivot transfers: Mod assist General transfer comment: min A to place L hand on RW once standing     Ambulation / Gait / Stairs / Wheelchair Mobility   Ambulation/Gait Ambulation/Gait assistance: Mod assist, Min assist Gait Distance (Feet): 18 Feet (+ 105') Assistive device: Rolling walker (2 wheels) Gait Pattern/deviations: Step-to pattern, Decreased step length - right, Decreased step length - left, Decreased stride length, Decreased weight shift to left, Decreased weight shift to right, Trunk flexed, Knee flexed in stance - left General Gait Details: asssit to keep L hand on RW and attend to L environment as pt bumping obstacles and with poor correction of path. mod A to correct L lateral lean with fatigue, cues for forward gaze and upright trunk throughout with pt able  to correct but unable to maintain, cues to center self in RW Gait velocity: reduced Gait velocity interpretation: <1.31 ft/sec, indicative of household ambulator     Posture / Balance Dynamic Sitting Balance Sitting balance - Comments: Pt able to find and hold midline for extend periods of time with UE's, but without assist, cuing only. Balance Overall balance assessment: Needs assistance Sitting-balance support: Single extremity supported, Feet supported Sitting balance-Leahy Scale: Fair Sitting balance - Comments: Pt able to find and hold midline for extend periods of time with UE's, but without assist, cuing only. Postural control: Left lateral lean Standing balance support: Bilateral upper extremity supported, During functional activity Standing balance-Leahy Scale: Poor Standing balance comment: min A for standing balance with R UE support, some L lateral lean with fatigue     Special needs/care consideration Skin Right thigh incision, Diabetic management Hgb A1C 5.9, glucose elevated and receiving insulin  in acute hospital, and Special service needs None    Previous Home Environment (from acute therapy documentation) Living Arrangements: Spouse/significant other  Lives With: Spouse, Son (adult son (67 yo)) Available Help at Discharge: Friend(s), Available 24 hours/day Type of Home: Mobile home Home Layout: One level Home Access: Ramped entrance Bathroom Shower/Tub: Engineer, Manufacturing Systems: Standard Bathroom Accessibility: Yes How Accessible: Accessible via wheelchair, Accessible via walker Home Care Services: No Additional Comments: wife is home with son with physical disability during the day   Discharge Living Setting Plans for Discharge Living Setting: Patient's home Type of Home at Discharge: Mobile home Discharge Home Layout: One level Discharge Home Access: Ramped entrance Discharge Bathroom Shower/Tub: Tub only, Tub/shower unit Discharge Bathroom Toilet:  Standard Discharge Bathroom Accessibility: Yes How Accessible: Accessible via walker   Social/Family/Support Systems Patient Roles: Spouse Contact Information: [phone] Anticipated Caregiver: [name] Caregiver Availability: 24/7 Discharge Plan Discussed with Primary Caregiver: Yes Is Caregiver In Agreement with Plan?: Yes Does Caregiver/Family have Issues with Lodging/Transportation while Pt is in Rehab?: No   Goals Patient/Family Goal for Rehab: PT/OT/SLP Supervision level Expected length of stay: 18-21 days Pt/Family Agrees to Admission and willing to participate: No Program Orientation Provided & Reviewed with Pt/Caregiver Including Roles  & Responsibilities: Yes  Decrease burden of Care through IP rehab admission: not anticipated   Possible need for SNF placement upon discharge: not anticipated   Patient Condition: I have reviewed medical records from [facility], spoken with CM, and patient and spouse. I met with patient at the bedside for inpatient rehabilitation assessment.  Patient will benefit from ongoing PT and OT, can actively participate in 3 hours of therapy a day 5 days of the week, and can make measurable gains during the admission.  Patient will also benefit from the coordinated team approach during an Inpatient Acute Rehabilitation admission.  The patient will receive intensive therapy as well as Rehabilitation physician, nursing, social worker, and care management interventions.  Due to safety, skin/wound care, disease management, medication administration, pain management, and patient education the patient requires 24 hour a day rehabilitation nursing.  The patient is currently min A  with mobility and basic ADLs.  Discharge setting and therapy post discharge at home with home health is anticipated.  Patient has agreed to participate in the Acute Inpatient Rehabilitation Program and will admit today.   Preadmission Screen Completed By:  Lovett CHRISTELLA Ropes, 12/11/2023 10:27 AM ______________________________________________________________________   Discussed status with Dr. Babs on 12/11/23 at 0945 and received approval for admission today.   Admission Coordinator:  Lovett CHRISTELLA Ropes, RN, time 1034/Date 12/11/23    Assessment/Plan: Diagnosis: Right MCA infarct Does the need for close, 24 hr/day Medical supervision in concert with the patient's rehab needs make it unreasonable for this patient to be served in a less intensive setting? Yes Co-Morbidities requiring supervision/potential complications: sz, gerd Due to bladder management, bowel management, safety, skin/wound care, disease management, medication administration, pain management, and patient education, does the patient require 24 hr/day rehab nursing? Yes Does the patient require coordinated care of a physician, rehab nurse, PT, OT, and SLP to address physical and functional deficits in the context of the above medical diagnosis(es)? Yes Addressing deficits in the following areas: balance, endurance, locomotion, strength, transferring, bowel/bladder control, bathing, dressing, feeding, grooming, toileting, cognition, speech, swallowing, and psychosocial support Can the patient actively participate in an intensive therapy program of at least 3 hrs of therapy 5 days a week? Yes The potential for patient to make measurable gains while on inpatient rehab is excellent Anticipated functional outcomes upon discharge from inpatient rehab: supervision PT, supervision OT, supervision SLP Estimated rehab length of stay to reach the above functional goals is: 18-21 days Anticipated discharge destination: Home 10. Overall Rehab/Functional Prognosis: excellent     MD Signature: Arthea IVAR Babs, MD, [facility] Physical Medicine & Rehabilitation Medical Director Rehabilitation Services 12/11/2023          Revision History

## 2023-12-11 NOTE — Discharge Summary (Addendum)
 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 Start taking on: December 12, 2023 Replaces: aspirin  EC 81 MG tablet   atorvastatin  20 MG tablet Commonly known as: LIPITOR Take 20 mg by mouth every evening.   calcium  carbonate 500 MG chewable tablet Commonly known as: TUMS - dosed in mg elemental calcium  Chew 1 tablet (200 mg of elemental calcium  total) by mouth 2 (two) times daily as needed for indigestion or heartburn.   Chlorhexidine  Gluconate Cloth 2 % Pads Apply 6 each topically daily. Start taking on: December 12, 2023   docusate 50 MG/5ML liquid Commonly known as: COLACE Take 10 mLs (100 mg total) by mouth daily. Start taking on: December 12, 2023   enoxaparin  40 MG/0.4ML injection Commonly known as: LOVENOX  Inject 0.4 mLs (40 mg total) into the skin daily.   esomeprazole  20 MG capsule Commonly known as: NEXIUM  Take 1 capsule (20 mg total) by mouth daily before breakfast. Start taking on: December 12, 2023 What changed: when to take this   feeding supplement  (PROSource TF20) liquid Place 60 mLs into feeding tube 2 (two) times daily.   feeding supplement Liqd Take 237 mLs by mouth 3 (three) times daily between meals.   feeding supplement (OSMOLITE 1.5 CAL) Liqd Place 1,000 mLs into feeding tube daily.   free water  Soln Place 150 mLs into feeding tube every 4 (four) hours.   insulin  aspart 100 UNIT/ML injection Commonly known as: novoLOG  Inject 0-6 Units into the skin 3 (three) times daily with meals.   ipratropium-albuterol  0.5-2.5 (3) MG/3ML Soln Commonly known as: DUONEB Take 3 mLs by nebulization every 6 (six) hours.   levothyroxine  50 MCG tablet Commonly known as: SYNTHROID  Take 50 mcg by mouth daily.   OMEGA 3 PO Take 1 g by mouth 2 (two) times daily.   ondansetron  4 MG/2ML Soln injection Commonly known as: ZOFRAN  Inject 2 mLs (4 mg total) into the vein every 6 (six) hours as needed for nausea or vomiting.   predniSONE  10 MG tablet Commonly known as: DELTASONE  Take 2 tablets (20 mg total) by mouth daily with breakfast for 2 days, THEN 1 tablet (10 mg total) daily with breakfast for 2 days. Start taking on: December 12, 2023   senna-docusate 8.6-50 MG tablet Commonly known as: Senokot-S Take 1 tablet by mouth at bedtime as needed for moderate constipation or mild constipation.   ticagrelor  90 MG Tabs tablet Commonly known as: BRILINTA  Take 1 tablet (90 mg total) by  mouth 2 (two) times daily.        LABORATORY STUDIES CBC    Component Value Date/Time   WBC 13.1 (H) 12/11/2023 0705   RBC 4.99 12/11/2023 0705   HGB 15.4 12/11/2023 0705   HCT 43.9 12/11/2023 0705   PLT 285 12/11/2023 0705   MCV 88.0 12/11/2023 0705   MCH 30.9 12/11/2023 0705   MCHC 35.1 12/11/2023 0705   RDW 13.3 12/11/2023 0705   LYMPHSABS 2.2 11/30/2023 1424   MONOABS 1.1 (H) 11/30/2023 1424   EOSABS 0.2 11/30/2023 1424   BASOSABS 0.1 11/30/2023 1424   CMP    Component Value Date/Time   NA 133 (L) 12/11/2023 0705   K 4.0 12/11/2023  0705   CL 102 12/11/2023 0705   CO2 21 (L) 12/11/2023 0705   GLUCOSE 156 (H) 12/11/2023 0705   BUN 28 (H) 12/11/2023 0705   CREATININE 0.82 12/11/2023 0705   CALCIUM  8.5 (L) 12/11/2023 0705   PROT 7.4 11/30/2023 1424   ALBUMIN  3.6 11/30/2023 1424   AST 24 11/30/2023 1424   ALT 23 11/30/2023 1424   ALKPHOS 59 11/30/2023 1424   BILITOT 0.8 11/30/2023 1424   GFRNONAA >60 12/11/2023 0705   GFRAA >60 10/20/2017 1341   COAGS Lab Results  Component Value Date   INR 1.1 11/30/2023   INR 1.11 01/08/2017   Lipid Panel    Component Value Date/Time   CHOL 112 12/01/2023 0647   TRIG 146 12/01/2023 0647   HDL 26 (L) 12/01/2023 0647   CHOLHDL 4.3 12/01/2023 0647   VLDL 29 12/01/2023 0647   LDLCALC 57 12/01/2023 0647   HgbA1C  Lab Results  Component Value Date   HGBA1C 5.9 (H) 11/30/2023   Alcohol Level    Component Value Date/Time   ETH <10 11/30/2023 1424     SIGNIFICANT DIAGNOSTIC STUDIES CT SOFT TISSUE NECK W CONTRAST Result Date: 12/07/2023 CLINICAL DATA:  Vocal cord paralysis EXAM: CT NECK WITH CONTRAST TECHNIQUE: Multidetector CT imaging of the neck was performed using the standard protocol following the bolus administration of intravenous contrast. RADIATION DOSE REDUCTION: This exam was performed according to the departmental dose-optimization program which includes automated exposure control, adjustment of the mA and/or kV according to patient size and/or use of iterative reconstruction technique. CONTRAST:  75mL OMNIPAQUE  IOHEXOL  350 MG/ML SOLN COMPARISON:  None Available. FINDINGS: Pharynx and larynx: Normal. No mass or swelling. Symmetric vocal cords. Salivary glands: No inflammation, mass, or stone. Thyroid: Normal. Lymph nodes: None enlarged or abnormal density. Vascular: Right ICA stent appears patent. Left carotid bifurcation atherosclerosis with left ICA narrowing, better characterized on prior CTA. Aortic atherosclerosis. Limited intracranial: Negative. Visualized  orbits: Negative. Mastoids and visualized paranasal sinuses: Clear. Skeleton: No acute abnormality on limited assessment. Upper chest: Visualized lung apices are clear. IMPRESSION: Unremarkable CT of the neck. No visible mass or adenopathy. Symmetric vocal cords by CT. Electronically Signed   By: Gilmore GORMAN Molt M.D.   On: 12/07/2023 01:13   DG Abd Portable 1V Result Date: 12/04/2023 CLINICAL DATA:  Feeding tube placement EXAM: PORTABLE ABDOMEN - 1 VIEW COMPARISON:  Abdominal x-ray 01/12/2017 FINDINGS: Feeding tube is at the level of the gastric antrum. No dilated bowel loops are seen. There is oral contrast in small bowel and colon. IMPRESSION: Feeding tube is at the level of the gastric antrum. Electronically Signed   By: Greig Pique M.D.   On: 12/04/2023 15:48   DG Swallowing Func-Speech Pathology Result Date: 12/04/2023 Table formatting from the original  result was not included. Modified Barium Swallow Study Patient Details Name: Ian Wong MRN: [id number] Date of Birth: [birth year] Today's Date: 12/04/2023 HPI/PMH: HPI: 63 yo male transfer from [facility] with L side weakness and R gaze deviation s/p TNK and thrombectomy 12/30. There was some concern for aspiration after vomiting episode during intubation so was kept on vent until 12/31. 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 Repeate CT 1/1 due to decreased wakefulness showed increasing mass effect on the R lateral ventricle.  PMH includes: GERD, hypothyroidism, seizures Clinical Impression: Clinical Impression: Pt has a moderate oropharyngeal dysphagia including reduced labial seal with anterior loss on the L side and slow posterior lingual transit. He has adequate oral and pharyngeal clearance. He has reduced laryngeal elevation and laryngeal vestibule closure but he can still protect his airway well when given small boluses, controlled in size and administered by SLP. When he self-feeds, thin and nectar thick liquids spill more into the  pyriform sinuses before the swallow and they end up spilling into the airway before he achieves full epiglottic inversion. Aspiration is sensed in larger volumes (PAS 7, not able to eject aspirates despite strong sounding cough) but was silent x1 (PAS 8) with smaller voume. He contains boluses more consistently above his valleculae before the swallow with honey thick liquids and purees, and as a result, there is no aspiration observed. Recommend starting with Dys 1 (puree) diet and honey thick liquids but would need full supervision to assist with small bolus sizes, getting one bite or sip at a time. Would monitor respiratory status closely and even consider cortrak for consistency of nutrition/medications. Factors that may increase risk of adverse event in presence of aspiration Noe & Lianne 2021): Factors that may increase risk of adverse event in presence of aspiration Noe & Lianne 2021): Respiratory or GI disease; Reduced cognitive function; Limited mobility; Frail or deconditioned; Dependence for feeding and/or oral hygiene; Reduced saliva; Weak cough; Presence of tubes (ETT, trach, NG, etc.); Aspiration of thick, dense, and/or acidic materials Recommendations/Plan: Swallowing Evaluation Recommendations Swallowing Evaluation Recommendations Recommendations: PO diet PO Diet Recommendation: Dysphagia 1 (Pureed); Moderately thick liquids (Level 3, honey thick) Liquid Administration via: Cup; Spoon Medication Administration: Crushed with puree Supervision: Staff to assist with self-feeding; Full supervision/cueing for swallowing strategies Swallowing strategies  : Minimize environmental distractions; Slow rate; Small bites/sips (one small bite or sip at a time, monitor respiratory status closely) Postural changes: Position pt fully upright for meals; Stay upright 30-60 min after meals Oral care recommendations: Oral care QID (4x/day) Caregiver Recommendations: Avoid jello, ice cream, thin soups,  popsicles; Remove water  pitcher; Have oral suction available Treatment Plan Treatment Plan Treatment recommendations: Therapy as outlined in treatment plan below Follow-up recommendations: Acute inpatient rehab (3 hours/day) Functional status assessment: Patient has had a recent decline in their functional status and demonstrates the ability to make significant improvements in function in a reasonable and predictable amount of time. Treatment frequency: Min 2x/week Treatment duration: 2 weeks Interventions: Aspiration precaution training; Compensatory techniques; Patient/family education; Trials of upgraded texture/liquids; Diet toleration management by SLP; Respiratory muscle strength training Recommendations Recommendations for follow up therapy are one component of a multi-disciplinary discharge planning process, led by the attending physician.  Recommendations may be updated based on patient status, additional functional criteria and insurance authorization. Assessment: Orofacial Exam: Orofacial Exam Oral Cavity - Dentition: Missing dentition Orofacial Anatomy: WFL Anatomy: Anatomy: Prominent cricopharyngeus Boluses Administered: Boluses Administered Boluses Administered: Thin liquids (Level 0); Mildly thick liquids (Level  2, nectar thick); Moderately thick liquids (Level 3, honey thick); Puree  Oral Impairment Domain: Oral Impairment Domain Lip Closure: Escape beyond mid-chin Tongue control during bolus hold: Posterior escape of greater than half of bolus Bolus preparation/mastication: -- (deferred) Bolus transport/lingual motion: Slow tongue motion Oral residue: Complete oral clearance Location of oral residue : N/A Initiation of pharyngeal swallow : Pyriform sinuses  Pharyngeal Impairment Domain: Pharyngeal Impairment Domain Soft palate elevation: No bolus between soft palate (SP)/pharyngeal wall (PW) Laryngeal elevation: Partial superior movement of thyroid cartilage/partial approximation of arytenoids to  epiglottic petiole Anterior hyoid excursion: Complete anterior movement Epiglottic movement: Complete inversion Laryngeal vestibule closure: Incomplete, narrow column air/contrast in laryngeal vestibule Pharyngeal stripping wave : Present - complete Pharyngeal contraction (A/P view only): N/A Pharyngoesophageal segment opening: Partial distention/partial duration, partial obstruction of flow Tongue base retraction: Trace column of contrast or air between tongue base and PPW Pharyngeal residue: Complete pharyngeal clearance Location of pharyngeal residue: N/A  Esophageal Impairment Domain: Esophageal Impairment Domain Esophageal clearance upright position: -- (unable to view due to body habitus) Pill: Pill Consistency administered: -- (deferred) Penetration/Aspiration Scale Score: Penetration/Aspiration Scale Score 1.  Material does not enter airway: Moderately thick liquids (Level 3, honey thick); Puree 7.  Material enters airway, passes BELOW cords and not ejected out despite cough attempt by patient: Mildly thick liquids (Level 2, nectar thick) 8.  Material enters airway, passes BELOW cords without attempt by patient to eject out (silent aspiration) : Thin liquids (Level 0) Compensatory Strategies: Compensatory Strategies Compensatory strategies: No   General Information: Caregiver present: Yes (RN)  Diet Prior to this Study: NPO   Temperature : Normal   Respiratory Status: Increased WOB; Other (comment) (wheezing)   Supplemental O2: Nasal cannula   History of Recent Intubation: Yes  Behavior/Cognition: Alert; Cooperative; Requires cueing Self-Feeding Abilities: Able to self-feed Baseline vocal quality/speech: Hypophonia/low volume; Dysphonic Volitional Cough: Able to elicit Volitional Swallow: Able to elicit Exam Limitations: No limitations Goal Planning: Prognosis for improved oropharyngeal function: Good Barriers to Reach Goals: Cognitive deficits No data recorded Patient/Family Stated Goal: none stated  Consulted and agree with results and recommendations: Patient; Physician; Advanced practice provider; Nurse Pain: Pain Assessment Pain Assessment: Faces Faces Pain Scale: 0 Facial Expression: 0 Body Movements: 0 Muscle Tension: 0 Compliance with ventilator (intubated pts.): N/A Vocalization (extubated pts.): 0 CPOT Total: 0 Pain Location: generalized with mobility Pain Descriptors / Indicators: Discomfort; Grimacing Pain Intervention(s): Monitored during session; Limited activity within patient's tolerance; Repositioned End of Session: Start Time:SLP Start Time (ACUTE ONLY): 0944 Stop Time: SLP Stop Time (ACUTE ONLY): 1005 Time Calculation:SLP Time Calculation (min) (ACUTE ONLY): 21 min Charges: SLP Evaluations $ SLP Speech Visit: 1 Visit SLP Evaluations $MBS Swallow: 1 Procedure $Swallowing Treatment: 1 Procedure SLP visit diagnosis: SLP Visit Diagnosis: Dysphagia, oropharyngeal phase (R13.12) Past Medical History: Past Medical History: Diagnosis Date  GERD (gastroesophageal reflux disease)   Hypothyroidism   Seizures (HCC)  Past Surgical History: Past Surgical History: Procedure Laterality Date  AORTA - BILATERAL FEMORAL ARTERY BYPASS GRAFT Bilateral 01/07/2017  Procedure: AORTOBIFEMORAL BYPASS GRAFT;  Surgeon: Krystal JULIANNA Doing, MD;  [facility] OR;  Service: Vascular;  Laterality: Bilateral;  I & D EXTREMITY Left 11/28/2014  Procedure: IRRIGATION AND DEBRIDEMENT;  REPAIR OF CRUSH INJURY;  Surgeon: Franky Curia, MD;  Location: [facility] OR;  Service: Orthopedics;  Laterality: Left;  INCISIONAL HERNIA REPAIR N/A 10/26/2017  Procedure: HERNIA REPAIR INCISIONAL WITH MESH;  Surgeon: Mavis Anes, MD;  Location: [facility] ORS;  Service: General;  Laterality: N/A;  IR CT HEAD LTD  11/30/2023  IR INTRAVSC STENT CERV CAROTID W/EMB-PROT MOD SED INCL ANGIO  11/30/2023  IR PERCUTANEOUS ART THROMBECTOMY/INFUSION INTRACRANIAL INC DIAG ANGIO  11/30/2023  IR US  GUIDE VASC ACCESS RIGHT  11/30/2023  RADIOLOGY WITH ANESTHESIA N/A 11/30/2023   Procedure: IR WITH ANESTHESIA;  Surgeon: Radiologist, Medication, MD;  Location: [facility] OR;  Service: Radiology;  Laterality: N/A; Leita SAILOR., M.A. CCC-SLP Acute Rehabilitation Services Office [phone] Secure chat preferred 12/04/2023, 10:52 AM  IR PERCUTANEOUS ART THROMBECTOMY/INFUSION INTRACRANIAL INC DIAG ANGIO Result Date: 12/04/2023 INDICATION: Sota Hetz is a 63 year old male who presented to [facility] with right gaze deviation and left hemiplegia; NIHSS 24. His known well was 12:45 p.m. on 11/30/2023. His past medical history significant for GERD (gastroesophageal reflux disease), Hypothyroidism, and Seizures (HCC); baseline modified Rankin scale 2. Head CT showed hypodensity in right insula and temporal lobe (ASPECTS 8). IV TNK was then administered. CT angiogram of the head and neck showed an occlusion of the right internal carotid artery at the bulb with evidence of ulcerated carotid plaque. There is small segment of rectal sensation at the supraclinoid segment with reocclusion at the terminus extending into the M1 segment and proximal A1 segment (T occlusion) with poor collaterals. He was transferred to [facility] for mechanical thrombectomy. EXAM: ULTRASOUND-GUIDED VASCULAR ACCESS DIAGNOSTIC CEREBRAL ANGIOGRAM MECHANICAL THROMBECTOMY RIGHT CAROTID ANGIOPLASTY AND STENTING WITH CEREBRAL PROTECTION DEVICE FLAT PANEL HEAD CT COMPARISON:  CT/CT angiogram of head and neck November 30, 2023. MEDICATIONS: No antibiotics administered. ANESTHESIA/SEDATION: The procedure was performed under general anesthesia. CONTRAST:  85 mL of Omnipaque  300 milligram/mL. FLUOROSCOPY: Radiation Exposure Index (as provided by the fluoroscopic device): 1005 mGy Kerma COMPLICATIONS: None immediate. TECHNIQUE: Informed written consent was obtained from the patient's wife after a thorough discussion of the procedural risks, benefits and alternatives. All questions were addressed. Maximal Sterile Barrier  Technique was utilized including caps, mask, sterile gowns, sterile gloves, sterile drape, hand hygiene and skin antiseptic. A timeout was performed prior to the initiation of the procedure. The right groin was prepped and draped in the usual sterile fashion. Using a micropuncture kit and the modified Seldinger technique, access was gained to the right common femoral artery and an 8 French sheath was placed. Real-time ultrasound guidance was utilized for vascular access including the acquisition of a permanent ultrasound image documenting patency of the accessed vessel. Under fluoroscopy, BMX 96 guide catheter was navigated over a 6 French Berenstein 2 catheter and a 0.035 Terumo Glidewire into the aortic arch. The catheter was placed into the right common carotid artery and then advanced into the . The diagnostic catheter was removed. Frontal and lateral angiograms of the neck and head were obtained. FINDINGS: 1. Normal caliber of right common femoral artery/graft, adequate for vascular access. 2. 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 3. 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 PROCEDURE: Using biplane roadmap guidance, a FreeClimb 70 aspiration catheter was navigated over a Tenzing delivery catheter and an Aristotle 14 microguidewire into the cavernous segment of the right ICA. The aspiration catheter was then advanced to the level of occlusion and connected to an aspiration pump. Continuous aspiration was performed for 2 minutes. The guide catheter was connected to a VacLok syringe. The aspiration catheter was subsequently removed under constant aspiration. The guide catheter was aspirated for debris. Right internal carotid artery angiograms with frontal and lateral  views of the head showed complete recanalization of the right MCA vascular tree. Flat panel CT of the head was obtained and post processed  in a separate workstation with concurrent attending physician supervision. Selected images were sent to PACS. No evidence of hemorrhagic complication. At this point, patient received intravenous bolus of cangrelor  followed by drip. Right common carotid artery angiograms with frontal and lateral views of the neck showed severe atherosclerotic disease of the right carotid bifurcation with severe stenosis evidence of ulcerated plaque. The A 2.5-4.8 mm Emboshield NAV 6 cerebral protection device was navigated into the distal cervical segment of the right ICA. Subsequently, angioplasty of the right carotid bulb was carried out under fluoroscopic guidance using a 6 x 30 mm Viatrac balloon. Next, a 10-8 x 40 mm Xact carotid stent was deployed in the right carotid artery, across the bifurcation. In stent angioplasty was performed using a 6 x 30 mm Viatrac balloon. The cerebral protection device was then recaptured. Right common carotid artery angiograms with frontal lateral views of the neck showed adequate position of the carotid stent with no residual stenosis. Frontal and lateral angiograms of the head showed no evidence of thromboembolic complication with brisk opacification of the entire right anterior circulation. Delayed right carotid angiograms with frontal and lateral views of the neck showed persistent patency of the carotid stent no evidence of clot formation. The catheter was subsequently withdrawn. Right common femoral artery angiogram was obtained in right anterior oblique view. The puncture is at the level of the common femoral artery graft which has normal caliber. The sheath was exchanged over the wire for an 8 French Angio-Seal which was utilized for access closure. Immediate hemostasis was achieved. IMPRESSION: 1. 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 2. Atherosclerotic disease at the right  carotid bifurcation with severe stenosis and plaque ulceration treated with angioplasty and stenting with no residual stenosis. 3. No evidence of thromboembolic or hemorrhagic complication. PLAN: Carotid ultrasound to evaluate stent patency in 3 months. Electronically Signed   By: Katyucia  de Macedo Rodrigues M.D.   On: 12/04/2023 10:41   IR CT Head Ltd Result Date: 12/04/2023 INDICATION: Ian Wong is a 63 year old male who presented to [facility] with right gaze deviation and left hemiplegia; NIHSS 24. His known well was 12:45 p.m. on 11/30/2023. His past medical history significant for GERD (gastroesophageal reflux disease), Hypothyroidism, and Seizures (HCC); baseline modified Rankin scale 2. Head CT showed hypodensity in right insula and temporal lobe (ASPECTS 8). IV TNK was then administered. CT angiogram of the head and neck showed an occlusion of the right internal carotid artery at the bulb with evidence of ulcerated carotid plaque. There is small segment of rectal sensation at the supraclinoid segment with reocclusion at the terminus extending into the M1 segment and proximal A1 segment (T occlusion) with poor collaterals. He was transferred to [facility] for mechanical thrombectomy. EXAM: ULTRASOUND-GUIDED VASCULAR ACCESS DIAGNOSTIC CEREBRAL ANGIOGRAM MECHANICAL THROMBECTOMY RIGHT CAROTID ANGIOPLASTY AND STENTING WITH CEREBRAL PROTECTION DEVICE FLAT PANEL HEAD CT COMPARISON:  CT/CT angiogram of head and neck November 30, 2023. MEDICATIONS: No antibiotics administered. ANESTHESIA/SEDATION: The procedure was performed under general anesthesia. CONTRAST:  85 mL of Omnipaque  300 milligram/mL. FLUOROSCOPY: Radiation Exposure Index (as provided by the fluoroscopic device): 1005 mGy Kerma COMPLICATIONS: None immediate. TECHNIQUE: Informed written consent was obtained from the patient's wife after a thorough discussion of the procedural risks, benefits and  alternatives. All questions  were addressed. Maximal Sterile Barrier Technique was utilized including caps, mask, sterile gowns, sterile gloves, sterile drape, hand hygiene and skin antiseptic. A timeout was performed prior to the initiation of the procedure. The right groin was prepped and draped in the usual sterile fashion. Using a micropuncture kit and the modified Seldinger technique, access was gained to the right common femoral artery and an 8 French sheath was placed. Real-time ultrasound guidance was utilized for vascular access including the acquisition of a permanent ultrasound image documenting patency of the accessed vessel. Under fluoroscopy, BMX 96 guide catheter was navigated over a 6 French Berenstein 2 catheter and a 0.035 Terumo Glidewire into the aortic arch. The catheter was placed into the right common carotid artery and then advanced into the . The diagnostic catheter was removed. Frontal and lateral angiograms of the neck and head were obtained. FINDINGS: 1. Normal caliber of right common femoral artery/graft, adequate for vascular access. 2. 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 3. 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 PROCEDURE: Using biplane roadmap guidance, a FreeClimb 70 aspiration catheter was navigated over a Tenzing delivery catheter and an Aristotle 14 microguidewire into the cavernous segment of the right ICA. The aspiration catheter was then advanced to the level of occlusion and connected to an aspiration pump. Continuous aspiration was performed for 2 minutes. The guide catheter was connected to a VacLok syringe. The aspiration catheter was subsequently removed under constant aspiration. The guide catheter was aspirated for debris. Right internal carotid artery angiograms with frontal and lateral views of the head showed complete recanalization of the right MCA vascular tree. Flat panel CT of  the head was obtained and post processed in a separate workstation with concurrent attending physician supervision. Selected images were sent to PACS. No evidence of hemorrhagic complication. At this point, patient received intravenous bolus of cangrelor  followed by drip. Right common carotid artery angiograms with frontal and lateral views of the neck showed severe atherosclerotic disease of the right carotid bifurcation with severe stenosis evidence of ulcerated plaque. The A 2.5-4.8 mm Emboshield NAV 6 cerebral protection device was navigated into the distal cervical segment of the right ICA. Subsequently, angioplasty of the right carotid bulb was carried out under fluoroscopic guidance using a 6 x 30 mm Viatrac balloon. Next, a 10-8 x 40 mm Xact carotid stent was deployed in the right carotid artery, across the bifurcation. In stent angioplasty was performed using a 6 x 30 mm Viatrac balloon. The cerebral protection device was then recaptured. Right common carotid artery angiograms with frontal lateral views of the neck showed adequate position of the carotid stent with no residual stenosis. Frontal and lateral angiograms of the head showed no evidence of thromboembolic complication with brisk opacification of the entire right anterior circulation. Delayed right carotid angiograms with frontal and lateral views of the neck showed persistent patency of the carotid stent no evidence of clot formation. The catheter was subsequently withdrawn. Right common femoral artery angiogram was obtained in right anterior oblique view. The puncture is at the level of the common femoral artery graft which has normal caliber. The sheath was exchanged over the wire for an 8 French Angio-Seal which was utilized for access closure. Immediate hemostasis was achieved. IMPRESSION: 1. 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 2.  Atherosclerotic disease at  the right carotid bifurcation with severe stenosis and plaque ulceration treated with angioplasty and stenting with no residual stenosis. 3. No evidence of thromboembolic or hemorrhagic complication. PLAN: Carotid ultrasound to evaluate stent patency in 3 months. Electronically Signed   By: Katyucia  de Macedo Rodrigues M.D.   On: 12/04/2023 10:41   IR US  Guide Vasc Access Right Result Date: 12/04/2023 INDICATION: Ian Wong is a 63 year old male who presented to [facility] with right gaze deviation and left hemiplegia; NIHSS 24. His known well was 12:45 p.m. on 11/30/2023. His past medical history significant for GERD (gastroesophageal reflux disease), Hypothyroidism, and Seizures (HCC); baseline modified Rankin scale 2. Head CT showed hypodensity in right insula and temporal lobe (ASPECTS 8). IV TNK was then administered. CT angiogram of the head and neck showed an occlusion of the right internal carotid artery at the bulb with evidence of ulcerated carotid plaque. There is small segment of rectal sensation at the supraclinoid segment with reocclusion at the terminus extending into the M1 segment and proximal A1 segment (T occlusion) with poor collaterals. He was transferred to [facility] for mechanical thrombectomy. EXAM: ULTRASOUND-GUIDED VASCULAR ACCESS DIAGNOSTIC CEREBRAL ANGIOGRAM MECHANICAL THROMBECTOMY RIGHT CAROTID ANGIOPLASTY AND STENTING WITH CEREBRAL PROTECTION DEVICE FLAT PANEL HEAD CT COMPARISON:  CT/CT angiogram of head and neck November 30, 2023. MEDICATIONS: No antibiotics administered. ANESTHESIA/SEDATION: The procedure was performed under general anesthesia. CONTRAST:  85 mL of Omnipaque  300 milligram/mL. FLUOROSCOPY: Radiation Exposure Index (as provided by the fluoroscopic device): 1005 mGy Kerma COMPLICATIONS: None immediate. TECHNIQUE: Informed written consent was obtained from the patient's wife after a thorough discussion of the  procedural risks, benefits and alternatives. All questions were addressed. Maximal Sterile Barrier Technique was utilized including caps, mask, sterile gowns, sterile gloves, sterile drape, hand hygiene and skin antiseptic. A timeout was performed prior to the initiation of the procedure. The right groin was prepped and draped in the usual sterile fashion. Using a micropuncture kit and the modified Seldinger technique, access was gained to the right common femoral artery and an 8 French sheath was placed. Real-time ultrasound guidance was utilized for vascular access including the acquisition of a permanent ultrasound image documenting patency of the accessed vessel. Under fluoroscopy, BMX 96 guide catheter was navigated over a 6 French Berenstein 2 catheter and a 0.035 Terumo Glidewire into the aortic arch. The catheter was placed into the right common carotid artery and then advanced into the . The diagnostic catheter was removed. Frontal and lateral angiograms of the neck and head were obtained. FINDINGS: 1. Normal caliber of right common femoral artery/graft, adequate for vascular access. 2. 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 3. 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 PROCEDURE: Using biplane roadmap guidance, a FreeClimb 70 aspiration catheter was navigated over a Tenzing delivery catheter and an Aristotle 14 microguidewire into the cavernous segment of the right ICA. The aspiration catheter was then advanced to the level of occlusion and connected to an aspiration pump. Continuous aspiration was performed for 2 minutes. The guide catheter was connected to a VacLok syringe. The aspiration catheter was subsequently removed under constant aspiration. The guide catheter was aspirated for debris. Right internal carotid artery angiograms with frontal and lateral views of the head showed complete  recanalization of the right MCA vascular tree. Flat panel CT of the head was obtained and post processed in a separate workstation with concurrent attending  physician supervision. Selected images were sent to PACS. No evidence of hemorrhagic complication. At this point, patient received intravenous bolus of cangrelor  followed by drip. Right common carotid artery angiograms with frontal and lateral views of the neck showed severe atherosclerotic disease of the right carotid bifurcation with severe stenosis evidence of ulcerated plaque. The A 2.5-4.8 mm Emboshield NAV 6 cerebral protection device was navigated into the distal cervical segment of the right ICA. Subsequently, angioplasty of the right carotid bulb was carried out under fluoroscopic guidance using a 6 x 30 mm Viatrac balloon. Next, a 10-8 x 40 mm Xact carotid stent was deployed in the right carotid artery, across the bifurcation. In stent angioplasty was performed using a 6 x 30 mm Viatrac balloon. The cerebral protection device was then recaptured. Right common carotid artery angiograms with frontal lateral views of the neck showed adequate position of the carotid stent with no residual stenosis. Frontal and lateral angiograms of the head showed no evidence of thromboembolic complication with brisk opacification of the entire right anterior circulation. Delayed right carotid angiograms with frontal and lateral views of the neck showed persistent patency of the carotid stent no evidence of clot formation. The catheter was subsequently withdrawn. Right common femoral artery angiogram was obtained in right anterior oblique view. The puncture is at the level of the common femoral artery graft which has normal caliber. The sheath was exchanged over the wire for an 8 French Angio-Seal which was utilized for access closure. Immediate hemostasis was achieved. IMPRESSION: 1. Successful mechanical thrombectomy for treatment of an intracranial right ICA occlusion  at the terminus achieving complete recanalization (TICI 3). 2. Atherosclerotic disease at the right carotid bifurcation with severe stenosis and plaque ulceration treated with angioplasty and stenting with no residual stenosis. 3. No evidence of thromboembolic or hemorrhagic complication. PLAN: Carotid ultrasound to evaluate stent patency in 3 months. Electronically Signed   By: Katyucia  de Macedo Rodrigues M.D.   On: 12/04/2023 10:41   IR INTRAVSC STENT CERV CAROTID W/EMB-PROT MOD SED Result Date: 12/04/2023 INDICATION: Ian Wong is a 63 year old male who presented to [facility] with right gaze deviation and left hemiplegia; NIHSS 24. His known well was 12:45 p.m. on 11/30/2023. His past medical history significant for GERD (gastroesophageal reflux disease), Hypothyroidism, and Seizures (HCC); baseline modified Rankin scale 2. Head CT showed hypodensity in right insula and temporal lobe (ASPECTS 8). IV TNK was then administered. CT angiogram of the head and neck showed an occlusion of the right internal carotid artery at the bulb with evidence of ulcerated carotid plaque. There is small segment of rectal sensation at the supraclinoid segment with reocclusion at the terminus extending into the M1 segment and proximal A1 segment (T occlusion) with poor collaterals. He was transferred to [facility] for mechanical thrombectomy. EXAM: ULTRASOUND-GUIDED VASCULAR ACCESS DIAGNOSTIC CEREBRAL ANGIOGRAM MECHANICAL THROMBECTOMY RIGHT CAROTID ANGIOPLASTY AND STENTING WITH CEREBRAL PROTECTION DEVICE FLAT PANEL HEAD CT COMPARISON:  CT/CT angiogram of head and neck November 30, 2023. MEDICATIONS: No antibiotics administered. ANESTHESIA/SEDATION: The procedure was performed under general anesthesia. CONTRAST:  85 mL of Omnipaque  300 milligram/mL. FLUOROSCOPY: Radiation Exposure Index (as provided by the fluoroscopic device): 1005 mGy Kerma COMPLICATIONS: None immediate. TECHNIQUE: Informed written  consent was obtained from the patient's wife after a thorough discussion of the procedural risks, benefits and alternatives. All questions were addressed. Maximal Sterile Barrier Technique was utilized including caps, mask, sterile gowns, sterile gloves, sterile drape, hand hygiene and skin antiseptic. A timeout  was performed prior to the initiation of the procedure. The right groin was prepped and draped in the usual sterile fashion. Using a micropuncture kit and the modified Seldinger technique, access was gained to the right common femoral artery and an 8 French sheath was placed. Real-time ultrasound guidance was utilized for vascular access including the acquisition of a permanent ultrasound image documenting patency of the accessed vessel. Under fluoroscopy, BMX 96 guide catheter was navigated over a 6 French Berenstein 2 catheter and a 0.035 Terumo Glidewire into the aortic arch. The catheter was placed into the right common carotid artery and then advanced into the . The diagnostic catheter was removed. Frontal and lateral angiograms of the neck and head were obtained. FINDINGS: 1. Normal caliber of right common femoral artery/graft, adequate for vascular access. 2. 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 3. 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 PROCEDURE: Using biplane roadmap guidance, a FreeClimb 70 aspiration catheter was navigated over a Tenzing delivery catheter and an Aristotle 14 microguidewire into the cavernous segment of the right ICA. The aspiration catheter was then advanced to the level of occlusion and connected to an aspiration pump. Continuous aspiration was performed for 2 minutes. The guide catheter was connected to a VacLok syringe. The aspiration catheter was subsequently removed under constant aspiration. The guide catheter was aspirated for debris. Right internal carotid  artery angiograms with frontal and lateral views of the head showed complete recanalization of the right MCA vascular tree. Flat panel CT of the head was obtained and post processed in a separate workstation with concurrent attending physician supervision. Selected images were sent to PACS. No evidence of hemorrhagic complication. At this point, patient received intravenous bolus of cangrelor  followed by drip. Right common carotid artery angiograms with frontal and lateral views of the neck showed severe atherosclerotic disease of the right carotid bifurcation with severe stenosis evidence of ulcerated plaque. The A 2.5-4.8 mm Emboshield NAV 6 cerebral protection device was navigated into the distal cervical segment of the right ICA. Subsequently, angioplasty of the right carotid bulb was carried out under fluoroscopic guidance using a 6 x 30 mm Viatrac balloon. Next, a 10-8 x 40 mm Xact carotid stent was deployed in the right carotid artery, across the bifurcation. In stent angioplasty was performed using a 6 x 30 mm Viatrac balloon. The cerebral protection device was then recaptured. Right common carotid artery angiograms with frontal lateral views of the neck showed adequate position of the carotid stent with no residual stenosis. Frontal and lateral angiograms of the head showed no evidence of thromboembolic complication with brisk opacification of the entire right anterior circulation. Delayed right carotid angiograms with frontal and lateral views of the neck showed persistent patency of the carotid stent no evidence of clot formation. The catheter was subsequently withdrawn. Right common femoral artery angiogram was obtained in right anterior oblique view. The puncture is at the level of the common femoral artery graft which has normal caliber. The sheath was exchanged over the wire for an 8 French Angio-Seal which was utilized for access closure. Immediate hemostasis was achieved. IMPRESSION: 1. 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 2. Atherosclerotic disease at the right carotid bifurcation with severe stenosis and plaque ulceration treated with angioplasty and stenting with no residual stenosis. 3. No evidence of thromboembolic or hemorrhagic complication. PLAN:  Carotid ultrasound to evaluate stent patency in 3 months. Electronically Signed   By: Katyucia  de Macedo Rodrigues M.D.   On: 12/04/2023 10:41   US  EKG SITE RITE Result Date: 12/03/2023 If Site Rite image not attached, placement could not be confirmed due to current cardiac rhythm.  CT HEAD WO CONTRAST ( ) Result Date: 12/02/2023 CLINICAL DATA:  Neuro deficit, acute, stroke suspected. EXAM: CT HEAD WITHOUT CONTRAST TECHNIQUE: Contiguous axial images were obtained from the base of the skull through the vertex without intravenous contrast. RADIATION DOSE REDUCTION: This exam was performed according to the departmental dose-optimization program which includes automated exposure control, adjustment of the mA and/or kV according to patient size and/or use of iterative reconstruction technique. COMPARISON:  MRI brain 12/01/2023.  Head CT 11/30/2023. FINDINGS: Brain: Evolving large acute right MCA territory infarct with increasing mass effect on the right lateral ventricle. No acute hemorrhage or midline shift. Stable background of moderate chronic small-vessel disease. No hydrocephalus or extra-axial collection. Vascular: No hyperdense vessel or unexpected calcification. Skull: No calvarial fracture or suspicious bone lesion. Skull base is unremarkable. Sinuses/Orbits: Mild mucosal thickening in the right sphenoid sinus. Orbits are unremarkable. Other: None. IMPRESSION: Evolving large acute right MCA territory infarct with increasing mass effect on the right lateral ventricle. No acute hemorrhage or midline shift. Electronically Signed   By: Ryan Chess M.D.   On: 12/02/2023 16:39   DG Chest Port 1 View Result Date: 12/02/2023 CLINICAL DATA:  Respiratory failure EXAM: PORTABLE CHEST 1 VIEW COMPARISON:  Chest radiograph dated 12/01/2023. FINDINGS: The heart size and mediastinal contours are within normal limits. There is mild left basilar atelectasis. The right lung is clear. No pleural effusion or pneumothorax. The visualized skeletal structures are unremarkable. IMPRESSION: Mild left basilar atelectasis. Electronically Signed   By: Norman Hopper M.D.   On: 12/02/2023 10:03   MR BRAIN WO CONTRAST Result Date: 12/01/2023 CLINICAL DATA:  Provided history: Stroke, follow-up. 24 hours post TNKase . EXAM: MRI HEAD WITHOUT CONTRAST TECHNIQUE: Multiplanar, multiecho pulse sequences of the brain and surrounding structures were obtained without intravenous contrast. COMPARISON:  Prior non-contrast head CT examinations 11/30/2023 and earlier. CT angiogram head/neck 11/30/2023. FINDINGS: Brain: No age advanced or lobar predominant parenchymal atrophy. Fairly extensive acute cortical/subcortical right MCA territory infarcts affecting portions of the frontal lobe/insula, temporal lobe, parietal lobe, occipital lobe and basal ganglia. There are few superimposed small foci of susceptibility-weighted signal loss within the right basal ganglia and within/along the right insula, which may reflect chronic microhemorrhages or acute petechial hemorrhage (Heidelberg class 1a, type HI1 scattered small petechiae, no mass effect). There are a few nonspecific punctate chronic microhemorrhages scattered elsewhere within the supratentorial brain. Background multifocal T2 FLAIR hyperintense signal abnormality within the cerebral white matter, nonspecific but compatible with moderate chronic small vessel ischemic disease. Chronic lacunar infarct within the left thalamus. No evidence of an intracranial mass. No extra-axial fluid collection. No midline shift. Vascular: Maintained  flow voids within the proximal large arterial vessels. Skull and upper cervical spine: No focal worrisome marrow lesion. Sinuses/Orbits: No mass or acute finding within the imaged orbits. Mild mucosal thickening within the bilateral sphenoid sinuses. Other: Trace fluid within the left mastoid air cells. Impression #1 will be called to the ordering clinician or representative by the Radiologist Assistant, and communication documented in the PACS or [employer]. IMPRESSION: 1. Fairly extensive acute right middle cerebral artery territory infarcts. There are a few superimposed small foci of susceptibility-weighted signal loss within the right basal  ganglia and within/along the right insula, which may reflect chronic microhemorrhages or acute petechial hemorrhage (Heidelberg class 1a, type HI1 scattered small petechiae, no mass effect). Consider a non-contrast head CT for further evaluation. 2. Few nonspecific punctate chronic microhemorrhages scattered elsewhere within the supratentorial brain. 3. Background moderate cerebral white matter chronic small vessel ischemic disease. 4. Chronic lacunar infarct within the left thalamus. Electronically Signed   By: Rockey Childs D.O.   On: 12/01/2023 14:39   ECHOCARDIOGRAM COMPLETE Result Date: 12/01/2023    ECHOCARDIOGRAM REPORT   Patient Name:   Nashaun Hillmer Date of Exam: 12/01/2023 Medical Rec #:  [id number]         Height:       71.0 in Accession #:    [id number]        Weight:       213.3 lb Date of Birth:  19-May-1961         BSA:          2.167 m Patient Age:    62 years          BP:           124/51 mmHg Patient Gender: M                 HR:           54 bpm. Exam Location:  Inpatient Procedure: 2D Echo, Cardiac Doppler and Color Doppler Indications:    Stroke  History:        Patient has prior history of Echocardiogram examinations, most                 recent 01/04/2017. PAD and Stroke.  Sonographer:    Lanell Maduro Referring Phys: SALMAN KHALIQDINA  IMPRESSIONS  1. Technically difficult study with very limited visualization of cardiac structures.  2. Left ventricular ejection fraction, by estimation, is 50 to 55%. The left ventricle has normal function. The left ventricle has no regional wall motion abnormalities. Left ventricular diastolic parameters were normal.  3. Right ventricular systolic function is normal. The right ventricular size is normal.  4. The mitral valve is grossly normal. No evidence of mitral valve regurgitation.  5. The aortic valve was not well visualized. Aortic valve regurgitation is not visualized.  6. The inferior vena cava is dilated in size with <50% respiratory variability, suggesting right atrial pressure of 15 mmHg. FINDINGS  Left Ventricle: Left ventricular ejection fraction, by estimation, is 50 to 55%. The left ventricle has normal function. The left ventricle has no regional wall motion abnormalities. The left ventricular internal cavity size was normal in size. There is  no left ventricular hypertrophy. Left ventricular diastolic parameters were normal. Right Ventricle: The right ventricular size is normal. No increase in right ventricular wall thickness. Right ventricular systolic function is normal. Left Atrium: Left atrial size was normal in size. Right Atrium: Right atrial size was normal in size. Pericardium: There is no evidence of pericardial effusion. Mitral Valve: The mitral valve is grossly normal. No evidence of mitral valve regurgitation. Tricuspid Valve: The tricuspid valve is not well visualized. Tricuspid valve regurgitation is trivial. Aortic Valve: The aortic valve was not well visualized. Aortic valve regurgitation is not visualized. Pulmonic Valve: The pulmonic valve was not assessed. Pulmonic valve regurgitation is not visualized. Aorta: The aortic root was not well visualized. Venous: The inferior vena cava is dilated in size with less than 50% respiratory variability, suggesting right atrial pressure of  15 mmHg. IAS/Shunts: The  interatrial septum was not well visualized.  LEFT VENTRICLE PLAX 2D LVIDd:         4.40 cm      Diastology LVIDs:         3.70 cm      LV e' medial:    10.30 cm/s LV PW:         0.80 cm      LV E/e' medial:  8.0 LV IVS:        0.70 cm      LV e' lateral:   12.70 cm/s LVOT diam:     2.20 cm      LV E/e' lateral: 6.5 LV SV:         63 LV SV Index:   29 LVOT Area:     3.80 cm  LV Volumes (MOD) LV vol d, MOD A2C: 88.3 ml LV vol d, MOD A4C: 152.0 ml LV vol s, MOD A2C: 44.0 ml LV vol s, MOD A4C: 72.5 ml LV SV MOD A2C:     44.3 ml LV SV MOD A4C:     152.0 ml LV SV MOD BP:      61.7 ml RIGHT VENTRICLE             IVC RV Basal diam:  4.30 cm     IVC diam: 2.30 cm RV Mid diam:    2.90 cm RV S prime:     10.40 cm/s TAPSE (M-mode): 2.1 cm LEFT ATRIUM           Index        RIGHT ATRIUM          Index LA diam:      2.70 cm 1.25 cm/m   RA Area:     9.40 cm LA Vol (A2C): 13.0 ml 6.00 ml/m   RA Volume:   16.90 ml 7.80 ml/m LA Vol (A4C): 33.2 ml 15.32 ml/m  AORTIC VALVE LVOT Vmax:   75.70 cm/s LVOT Vmean:  48.700 cm/s LVOT VTI:    0.166 m MITRAL VALVE MV Area (PHT): 5.38 cm    SHUNTS MV Decel Time: 141 msec    Systemic VTI:  0.17 m MV E velocity: 82.10 cm/s  Systemic Diam: 2.20 cm MV A velocity: 83.70 cm/s MV E/A ratio:  0.98 Aditya Sabharwal Electronically signed by Ria Commander Signature Date/Time: 12/01/2023/10:36:35 AM    Final    Portable Chest xray Result Date: 12/01/2023 CLINICAL DATA:  Respiratory failure. EXAM: PORTABLE CHEST 1 VIEW COMPARISON:  January 08, 2017. FINDINGS: The heart size and mediastinal contours are within normal limits. Endotracheal and nasogastric tubes are unchanged. Minimal bibasilar subsegmental atelectasis is again noted. The visualized skeletal structures are unremarkable. IMPRESSION: Stable support apparatus. Minimal bibasilar subsegmental atelectasis. Electronically Signed   By: Lynwood Landy Raddle M.D.   On: 12/01/2023 10:05   CT HEAD WO CONTRAST ( ) Result  Date: 11/30/2023 CLINICAL DATA:  Stroke follow-up, status post intervention EXAM: CT HEAD WITHOUT CONTRAST TECHNIQUE: Contiguous axial images were obtained from the base of the skull through the vertex without intravenous contrast. RADIATION DOSE REDUCTION: This exam was performed according to the departmental dose-optimization program which includes automated exposure control, adjustment of the mA and/or kV according to patient size and/or use of iterative reconstruction technique. COMPARISON:  None Available. FINDINGS: Exam quality is markedly degraded by streak artifacts from outside the field of view. Brain: There is no mass, hemorrhage or extra-axial collection. The size and configuration of the ventricles and extra-axial CSF  spaces are normal. There is hypoattenuation of the white matter, most commonly indicating chronic small vessel disease. Vascular: No hyperdense vessel or unexpected vascular calcification. Skull: The visualized skull base, calvarium and extracranial soft tissues are normal. Sinuses/Orbits: No fluid levels or advanced mucosal thickening of the visualized paranasal sinuses. No mastoid or middle ear effusion. Normal orbits. Other: None. IMPRESSION: 1. No acute intracranial abnormality. 2. Chronic small vessel disease. 3. Exam quality is markedly degraded by streak artifacts from outside the field of view. Electronically Signed   By: Franky Stanford M.D.   On: 11/30/2023 21:12   CT C-SPINE NO CHARGE Result Date: 11/30/2023 CLINICAL DATA:  Polytrauma, blunt. EXAM: CT CERVICAL SPINE WITHOUT CONTRAST TECHNIQUE: Multidetector CT imaging of the cervical spine was performed without intravenous contrast. Multiplanar CT image reconstructions were also generated. RADIATION DOSE REDUCTION: This exam was performed according to the departmental dose-optimization program which includes automated exposure control, adjustment of the mA and/or kV according to patient size and/or use of iterative  reconstruction technique. COMPARISON:  CTA head/neck 11/30/2023. FINDINGS: Alignment: Normal. Skull base and vertebrae: No acute fracture. Normal craniocervical junction. No suspicious bone lesions. Soft tissues and spinal canal: No prevertebral fluid or swelling. No visible canal hematoma. Please refer to same-day CTA for vascular findings. Disc levels:  No significant degenerative change. Upper chest: No acute findings. Other: None. IMPRESSION: No acute fracture or traumatic listhesis of the cervical spine. Electronically Signed   By: Ryan Chess M.D.   On: 11/30/2023 15:28   CT HEAD CODE STROKE WO CONTRAST Result Date: 11/30/2023 CLINICAL DATA:  Code stroke. Neuro deficit, acute, stroke suspected R ICA and MCA occlusion, s/p TNK, now with h/a. Headache after thrombolytic therapy. EXAM: CT HEAD WITHOUT CONTRAST TECHNIQUE: Contiguous axial images were obtained from the base of the skull through the vertex without intravenous contrast. RADIATION DOSE REDUCTION: This exam was performed according to the departmental dose-optimization program which includes automated exposure control, adjustment of the mA and/or kV according to patient size and/or use of iterative reconstruction technique. COMPARISON:  Head CT and CTA head/neck 11/30/2023. FINDINGS: Brain: No acute hemorrhage. Unchanged loss of gray-white differentiation along the right insula and anterior temporal lobe. Stable background of moderate chronic small-vessel disease. No hydrocephalus or extra-axial collection. No mass effect or midline shift. Vascular: Circulating contrast from recent CTA. Skull: No calvarial fracture or suspicious bone lesion. Skull base is unremarkable. Sinuses/Orbits: No acute finding. Other: None. ASPECTS [facility] Stroke Program Early CT Score) - Ganglionic level infarction (caudate, lentiform nuclei, internal capsule, insula, M1-M3 cortex): 5 - Supraganglionic infarction (M4-M6 cortex): 3 Total score (0-10 with 10 being  normal): 8 IMPRESSION: 1. Unchanged loss of gray-white differentiation along the right insula and anterior temporal lobe, compatible with acute infarct. No acute hemorrhage. 2. ASPECT score is 8. Code stroke imaging results were communicated on 11/30/2023 at 3:24 pm to provider Dr. Matthews via secure text paging. Electronically Signed   By: Ryan Chess M.D.   On: 11/30/2023 15:24   CT HEAD CODE STROKE WO CONTRAST Addendum Date: 11/30/2023 ADDENDUM REPORT: 11/30/2023 14:57 ADDENDUM: Findings discussed with Dr. Matthews via telephone at 2:52 p.m. Electronically Signed   By: Gilmore GORMAN Molt M.D.   On: 11/30/2023 14:57   Result Date: 11/30/2023 CLINICAL DATA:  Code stroke.  Neuro deficit, acute, stroke suspected EXAM: CT HEAD WITHOUT CONTRAST TECHNIQUE: Contiguous axial images were obtained from the base of the skull through the vertex without intravenous contrast. RADIATION DOSE REDUCTION: This exam was performed according  to the departmental dose-optimization program which includes automated exposure control, adjustment of the mA and/or kV according to patient size and/or use of iterative reconstruction technique. COMPARISON:  None Available. FINDINGS: Brain: Loss of gray-white differentiation in the right insula and overlying right operculum. No evidence of acute hemorrhage, mass effect or midline shift. Additional patchy white matter hypodensities are nonspecific but compatible with chronic microvascular ischemic disease. Vascular: No hyperdense vessel identified. Skull: No acute fracture identified. Sinuses/Orbits: Clear sinuses.  No acute orbital findings. Other: No mastoid effusions. ASPECTS [facility] Stroke Program Early CT Score) Total score (0-10 with 10 being normal): 8. DR. MATTHEWS paged at the time of dictation.  Awaiting call back. IMPRESSION: 1. Hyperdense right M1 MCA, concerning for thrombus. See forthcoming CTA. 2. Findings compatible with acute right MCA territory infarct with loss of gray  differentiation in the right insula and the overlying operculum. ASPECTS 8. Electronically Signed: By: Gilmore GORMAN Molt M.D. On: 11/30/2023 14:47   CT ANGIO HEAD NECK W WO CM (CODE STROKE) Result Date: 11/30/2023 CLINICAL DATA:  Neuro deficit, acute, stroke suspected R gaze L hemiplegia EXAM: CT ANGIOGRAPHY HEAD AND NECK WITH AND WITHOUT CONTRAST TECHNIQUE: Multidetector CT imaging of the head and neck was performed using the standard protocol during bolus administration of intravenous contrast. Multiplanar CT image reconstructions and MIPs were obtained to evaluate the vascular anatomy. Carotid stenosis measurements (when applicable) are obtained utilizing NASCET criteria, using the distal internal carotid diameter as the denominator. RADIATION DOSE REDUCTION: This exam was performed according to the departmental dose-optimization program which includes automated exposure control, adjustment of the mA and/or kV according to patient size and/or use of iterative reconstruction technique. CONTRAST:  75mL OMNIPAQUE  IOHEXOL  350 MG/ML SOLN COMPARISON:  None Available. FINDINGS: CTA NECK FINDINGS Aortic arch: Great vessel origins are patent. Aortic atherosclerosis. Right carotid system: Occluded ICA at its origin with non opacification throughout the neck. Left carotid system: Atherosclerosis at the carotid bifurcation with 70% stenosis of the ICA origin. Vertebral arteries: Right dominant. Severe right vertebral artery origin stenosis. Patent bilaterally. Skeleton: No acute abnormality on limited assessment. Other neck: No acute abnormality on limited assessment. Upper chest: Visualized lung apices are clear. Review of the MIP images confirms the above findings CTA HEAD FINDINGS Anterior circulation: The ICA is non-opacified proximally with brief reconstitution of the paraclinoid ICA. The ICAs and occluded again at the terminus with non-opacified M1 MCA and little opacification of more distal MCA vessels. Left ICA  and left MCA are opacified. The left A1 ACA is hypoplastic. Right A1 ACA and more distal ACAs bilaterally are opacified (right A1 ACAs likely from retrograde flow). Posterior circulation: Small/non dominant left intradural vertebral artery with superimposed severe stenosis. Right vertebral artery is patent. Basilar artery and bilateral posterior cerebral arteries are patent without proximal high-grade stenosis. Venous sinuses: As permitted by contrast timing, patent. Review of the MIP images confirms the above findings IMPRESSION: 1. Occluded right ICA at its origin with non opacification throughout the neck. The right paraclinoid ICA briefly reconstitutes with subsequent occlusion of the right M1 MCA, concerning for thrombus given hyperdense appearance on same day CT head. 2. Severe right vertebral artery origin stenosis. 3. Severe stenosis of the intradural small/non dominant left vertebral artery. 4. Approximately 70% stenosis of the left ICA origin. Findings discussed with Dr. Matthews via telephone at 2:52 p.m. Electronically Signed   By: Gilmore GORMAN Molt M.D.   On: 11/30/2023 14:57       HISTORY OF PRESENT ILLNESS Mr.  Ian Wong is a 63 y.o. male with past medical history significant for seizures, hypothyroidism, GERD who presented to [name] 12/30 with persistent right gaze deviation and left hemiplegia.  His last known well was [id number] family did call EMS when his symptoms started.  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  Patient was given TNK at [facility] and was transferred to [facility] for emergent thrombectomy. On neurologic neurology exam on arrival to [facility] patient still had persistent left-sided weakness, right gaze, left-sided sensory deficit, left hemianopsia, left sensory neglect. He was then taken emergently to IR. 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  S/p IR with TICI3 and right CAS MRI: moderate acute right MCA territory infarcts, chronic microhemorrhages versus acute petechial hemorrhages seen along the right basal ganglia. Chronic lacunar infarct within the left thalamus CT repeat stable infarct  2D Echo: LVEF 50 to 55% LDL 57 HgbA1c 5.9 UDS neg VTE prophylaxis - lovenox  aspirin  81 mg daily prior to admission, continue aspirin  81 mg daily and Brilinta  (ticagrelor ) 90 mg bid for carotid stenting Therapy recommendations: CIR    Essential Hypertension Hypotension, resolved Home meds:  none Stable now SBP goal above 110 Long term BP goal normotensive   COPD exacebation Respiratory distress Exertional wheezing, improving On prednisone  taper and nebs PRN Completed unasyn  x 5 days course Improving exertional wheezing On 1-2L O2 Hospital service on board  Appreciate assistance   Hyperlipidemia Home meds: Lipitor 20 mg, resumed in hospital LDL 57, goal < 70 Continue statin at discharge   Dysphagia Patient has post-stroke dysphagia, SLP consulted Now on Dysphagia level 1 -> 2, and honey thick -> nectar thick liquids Decreased appetite and put on ensures Change TF to nocturnal feeding to boost po intake Coretrak placed 1/3 Dietitian on board On calorie count   Other Stroke Risk Factors Former Cigarette Smoker   Other Active Problems Hypothyroidism Hx of remote seizure at young age, no seizures for a long time, no AED at home   DISCHARGE EXAM  General - Well nourished, well developed middle-age Caucasian male, in no apparent distress. HEENT- atraumatic, normocephalic  Cardiovascular - Regular rhythm and rate. Pulmonary- equal chest  rise; unlabored respirations. Bilateral rhonchi and continued wheezing improving.    Neuro: Mental Status: Patient is awake, alert, oriented to person, place, month, year, and situation.continues to follow all commands.  Patient is able to give a clear and coherent history with moderate dysarthria. No aphasia.  No signs of aphasia or neglect Cranial Nerves: II: Visual Fields are full. Pupils are equal, round, and reactive to light.   III,IV, VI: o gaze palsy, tracking examiner bilaterally  V: Facial sensation is symmetric to light touch CPP:Mphyu facial droop VIII: hearing is intact to voice X: Uvula elevates symmetrically XI: Shoulder shrug is symmetric. XII: tongue is midline  Motor: Tone is normal. Bulk is normal.  RUE: 4/5, no drift LUE: Increased movement seen, 4-/5. Lateral movement greater than vertical. Continue soft brace.  BLE: No drift present antigravity strength.  Sensory: Sensation is symmetric  to light touch in the arms and legs. Cerebellar: FNF intact on RUE, no ataxia seen. FNF with dysmetria on the left, RAM and fine finger movements slowed Gait:  Deferred  1a Level of Conscious.: 0 1b LOC Questions: 0 1c LOC Commands: 0 2 Best Gaze: 1 3 Visual: 1 4 Facial Palsy: 1 5a Motor Arm - left: 1 5b Motor Arm - Right: 0 6a Motor Leg - Left: 1 6b Motor Leg - Right: 0 7 Limb Ataxia: 0 8 Sensory: 1 9 Best Language: 0 10 Dysarthria: 1 11 Extinct. and Inatten.: 1 TOTAL: 8   Discharge Diet       Diet   DIET DYS 2 Room service appropriate? Yes with Assist; Fluid consistency: Nectar Thick   liquids  DISCHARGE PLAN Disposition: Rehab aspirin  81 mg daily and Brilinta  (ticagrelor ) 90 mg bid for secondary stroke prevention, stent maintenance Ongoing stroke risk factor control by Primary Care Physician at time of discharge Follow-up PCP Patient, No Pcp Per after discharge from rehab. TOC to assist Follow-up in [facility] in 8  weeks, office to schedule an appointment. Able to see NP in clinic.  35 minutes were spent preparing discharge.  Patient seen and examined by NP/APP with MD. MD to update note as needed.   Jorene Last, DNP, FNP-BC [facility] Pager: [phone]  ATTENDING NOTE: I reviewed above note and agree with the assessment and plan. Pt was seen and examined.   2 friends are at bedside.  Patient lying in bed, no acute event overnight, neuro stable, wheezing has improved.  On nocturnal tube feeding, encourage p.o. intake.  On DAPT and statin, medically stable for CIR placement.   For detailed assessment and plan, please refer to above/below as I have made changes wherever appropriate.   Ary Cummins, MD PhD Stroke Neurology 12/11/2023 3:28 PM

## 2023-12-11 NOTE — H&P (Signed)
 Physical Medicine and Rehabilitation Admission H&P        Chief Complaint  Patient presents with   Code Stroke  : HPI: Ian Wong is a 63 year old right-handed male with history  significant for GERD, hypothyroidism, remote seizure, PAD with bilateral femoral artery bypass 2018, quit smoking 7 years ago.  Per chart review patient lives with spouse.  Mobile home with ramped entrance.  Independent prior to admission.  They do have a son that is disabled.  Presented to [facility] 11/30/2023 with right gaze deviation and left hemiplegia/slurred speech of acute onset.  Cranial CT scan showed hyperdense right M1 MCA concerning for thrombus.  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  Patient did receive TNK at [facility]  CT angiogram of the head and neck occluded right ICA at its origin with nonopacification throughout the neck.  Severe right vertebral artery origin stenosis.  Severe stenosis of the intradural small/nondominant left vertebral artery.  Approximately 70% stenosis of the left ICA origin.  Admission chemistries unremarkable except WBC 14,400, hemoglobin A1c 5.9.  Patient was transferred to [facility] and underwent cerebral angiogram mechanical thrombectomy as well as right carotid stenting and angioplasty 11/30/2023 per Dr.de Macedo Rodriguez.  Follow-up MRI showed fairly extensive acute right middle cerebral artery territory infarcts.  Few nonspecific punctate chronic microhemorrhages scattered elsewhere within the supratentorial brain.  Chronic lacunar infarct within the left thalamus.  Echocardiogram ejection fraction of 50 to 55% no wall motion abnormalities.  Follow-up placed on aspirin  81 mg daily as well as Brilinta  90 mg twice daily for CVA prophylaxis.  Lovenox  for DVT prophylaxis.  Hospital course dysphagia currently on dysphagia #2 nectar thick liquid diet with nasogastric tube feeds for nutritional support.  There was question of COPD exacerbation  respiratory distress some exertional wheezing placed on prednisone  with taper completing a 5-day course of Unasyn .  He was being weaned from nasal cannula oxygen with latest chest x-ray showing mild left basilar atelectasis.  Patient with mild hyponatremia 133 and monitored.  Therapy evaluations completed due to patient decreased functional mobility and left-sided weakness was admitted for a comprehensive rehab program.   Review of Systems  Constitutional:  Negative for chills and fever.  HENT:  Negative for hearing loss.   Eyes:  Negative for blurred vision and double vision.  Respiratory:  Positive for wheezing. Negative for cough.        Shortness of breath with exertion  Cardiovascular:  Negative for chest pain, palpitations and leg swelling.  Gastrointestinal:  Positive for constipation. Negative for heartburn, nausea and vomiting.       GERD  Genitourinary:  Negative for dysuria, flank pain and hematuria.  Musculoskeletal:  Positive for joint pain and myalgias.  Skin:  Negative for rash.  Neurological:  Positive for speech change, seizures, weakness and headaches.  All other systems reviewed and are negative.       Past Medical History:  Diagnosis Date   GERD (gastroesophageal reflux disease)     Hypothyroidism     Seizures (HCC)               Past Surgical History:  Procedure Laterality Date   AORTA - BILATERAL FEMORAL ARTERY BYPASS GRAFT Bilateral 01/07/2017    Procedure: AORTOBIFEMORAL BYPASS GRAFT;  Surgeon: Krystal JULIANNA Doing, MD;  Location: [facility] OR;  Service: Vascular;  Laterality: Bilateral;   I & D EXTREMITY Left 11/28/2014    Procedure: IRRIGATION AND DEBRIDEMENT;  REPAIR OF CRUSH INJURY;  Surgeon: Franky Curia, MD;  Location: [facility] OR;  Service: Orthopedics;  Laterality: Left;   INCISIONAL HERNIA REPAIR N/A 10/26/2017    Procedure: HERNIA REPAIR INCISIONAL WITH MESH;  Surgeon: Mavis Anes, MD;  Location: [facility] ORS;  Service: General;  Laterality: N/A;   IR CT HEAD LTD    11/30/2023   IR INTRAVSC STENT CERV CAROTID W/EMB-PROT MOD SED INCL ANGIO   11/30/2023   IR PERCUTANEOUS ART THROMBECTOMY/INFUSION INTRACRANIAL INC DIAG ANGIO   11/30/2023   IR US  GUIDE VASC ACCESS RIGHT   11/30/2023   RADIOLOGY WITH ANESTHESIA N/A 11/30/2023    Procedure: IR WITH ANESTHESIA;  Surgeon: Radiologist, Medication, MD;  Location: [facility] OR;  Service: Radiology;  Laterality: N/A;             Family History  Problem Relation Age of Onset   COPD Mother     Other Brother          Social History:  reports that he quit smoking about 7 years ago. His smoking use included cigarettes. He has never used smokeless tobacco. He reports that he does not currently use alcohol. He reports that he does not use drugs. Allergies:  Allergies  No Known Allergies         Medications Prior to Admission  Medication Sig Dispense Refill   aspirin  EC 81 MG tablet Take 81 mg by mouth daily.       atorvastatin  (LIPITOR) 20 MG tablet Take 20 mg by mouth every evening.   2   esomeprazole  (NEXIUM  24HR) 20 MG capsule Take 20 mg daily at 12 noon by mouth.       ibuprofen (ADVIL) 800 MG tablet Take 800 mg by mouth every 8 (eight) hours as needed for moderate pain (pain score 4-6).       levothyroxine  (SYNTHROID , LEVOTHROID) 50 MCG tablet Take 50 mcg by mouth daily.   0   Omega-3 Fatty Acids (OMEGA 3 PO) Take 1 g by mouth 2 (two) times daily.                  Home: Home Living Family/patient expects to be discharged to:: Private residence Living Arrangements: Spouse/significant other Available Help at Discharge: Friend(s), Available 24 hours/day Type of Home: Mobile home Home Access: Ramped entrance Home Layout: One level Bathroom Shower/Tub: [employer] Accessibility: Yes Home Equipment: None Additional Comments: wife is home with son with physical disability during the day  Lives With: Spouse, [name])   Functional History: Prior  Function Prior Level of Function : Independent/Modified Independent, Working/employed, Driving ADLs Comments: works on [employer] Status:  Mobility: Bed Mobility Overal bed mobility: Needs Assistance Bed Mobility: Supine to Sit, Sit to Supine Rolling: Min assist Supine to sit: Contact guard, HOB elevated, Used rails Sit to supine: Contact guard assist General bed mobility comments: cues for sequencing and to utilize bedrail, increased time to complete but no physcal assist needed Transfers Overall transfer level: Needs assistance Equipment used: Rolling walker (2 wheels) Transfers: Sit to/from Stand, Bed to chair/wheelchair/BSC Sit to Stand: Min assist Bed to/from chair/wheelchair/BSC transfer type:: Step pivot Stand pivot transfers: Contact guard assist Step pivot transfers: Mod assist General transfer comment: min A to place L hand on RW once standing Ambulation/Gait Ambulation/Gait assistance: Mod assist, Min assist Gait Distance (Feet): 18 Feet (+ 105') Assistive device: Rolling walker (2 wheels) Gait Pattern/deviations: Step-to pattern, Decreased step length - right, Decreased step length - left,  Decreased stride length, Decreased weight shift to left, Decreased weight shift to right, Trunk flexed, Knee flexed in stance - left General Gait Details: asssit to keep L hand on RW and attend to L environment as pt bumping obstacles and with poor correction of path. mod A to correct L lateral lean with fatigue, cues for forward gaze and upright trunk throughout with pt able to correct but unable to maintain, cues to center self in RW Gait velocity: reduced Gait velocity interpretation: <1.31 ft/sec, indicative of household ambulator   ADL: ADL Overall ADL's : Needs assistance/impaired Eating/Feeding: NPO Grooming: Minimal assistance, Sitting Grooming Details (indicate cue type and reason): decreased awareness of L side of mouth with drool Upper Body Bathing: Moderate  assistance Lower Body Bathing: Maximal assistance Upper Body Dressing : Moderate assistance Lower Body Dressing: Maximal assistance Toilet Transfer: Minimal assistance, Ambulation, Rolling walker (2 wheels) Toilet Transfer Details (indicate cue type and reason): simulated recliner>bed Toileting - Clothing Manipulation Details (indicate cue type and reason): not aware of incontinence of bowel. pt with total (A) for hygiene. pt was able to sustain standing for the peri care General ADL Comments: ADL participation impacted by LUE hemiparesis   Cognition: Cognition Overall Cognitive Status: Impaired/Different from baseline Arousal/Alertness: Lethargic Orientation Level: Oriented X4 Attention: Sustained Sustained Attention: Impaired Sustained Attention Impairment: Verbal basic, Functional basic Awareness: Impaired Awareness Impairment: Intellectual impairment Problem Solving: Impaired Problem Solving Impairment: Verbal complex Safety/Judgment: Impaired Cognition Arousal: Alert Behavior During Therapy: Flat affect Overall Cognitive Status: Impaired/Different from baseline Area of Impairment: Attention, Safety/judgement, Awareness, Problem solving Current Attention Level: Sustained Memory: Decreased short-term memory Following Commands: Follows one step commands with increased time Safety/Judgement: Decreased awareness of safety, Decreased awareness of deficits Awareness: Emergent Problem Solving: Slow processing, Decreased initiation, Difficulty sequencing, Requires verbal cues General Comments: increased cues to attend to L   Physical Exam: Blood pressure 124/71, pulse 74, temperature 98.5 F (36.9 C), temperature source Oral, resp. rate 17, height 5' 11 (1.803 m), weight 96.8 kg, SpO2 97%. Physical Exam Constitutional:      General: He is not in acute distress. HENT:     Head: Normocephalic.     Right Ear: External ear normal.     Left Ear: External ear normal.     Nose:      Comments: NGT Eyes:     Conjunctiva/sclera: Conjunctivae normal.     Pupils: Pupils are equal, round, and reactive to light.  Cardiovascular:     Rate and Rhythm: Normal rate and regular rhythm.     Heart sounds: No murmur heard.    No gallop.  Pulmonary:     Effort: Pulmonary effort is normal. No respiratory distress.     Breath sounds: No wheezing.  Abdominal:     General: Bowel sounds are normal. There is no distension.     Palpations: Abdomen is soft.     Tenderness: There is no abdominal tenderness.  Genitourinary:    Penis: Normal.   Musculoskeletal:        General: No swelling or tenderness.     Cervical back: Normal range of motion.  Skin:    General: Skin is warm and dry.  Neurological:     Mental Status: He is alert.     Comments: Pt is alert. Abulic. Oriented to person, place, month/year and reason. Pt demonstrates fair insight and awareness. Able to provide some limited biographical information. Right gaze preference. Can come to the left with cueing. Speech with moderate volume but  dysarthric. Left central 7. Decreased sensation of left face. 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 MMT: LUE is 2/5 deltoids, biceps, triceps and 2+ left wrist and hand. LLE appears to be 4- to 4/5. Sensation is 1+/2 Left arm and leg. DTR's 3+ left arm and leg. No abnl resting tone.   Psychiatric:     Comments: Flat, slow to engage        Lab Results Last 48 Hours        Results for orders placed or performed during the hospital encounter of 11/30/23 (from the past 48 hours)  Glucose, capillary     Status: Abnormal    Collection Time: 12/09/23 12:29 PM  Result Value Ref Range    Glucose-Capillary 149 (H) 70 - 99 mg/dL      Comment: Glucose reference range applies only to samples taken after fasting for at least 8 hours.  Glucose, capillary     Status: Abnormal    Collection Time: 12/09/23  4:45 PM  Result Value Ref Range    Glucose-Capillary 173 (H) 70 - 99 mg/dL       Comment: Glucose reference range applies only to samples taken after fasting for at least 8 hours.  Glucose, capillary     Status: Abnormal    Collection Time: 12/09/23  9:35 PM  Result Value Ref Range    Glucose-Capillary 161 (H) 70 - 99 mg/dL      Comment: Glucose reference range applies only to samples taken after fasting for at least 8 hours.    Comment 1 Notify RN    Glucose, capillary     Status: Abnormal    Collection Time: 12/10/23  6:31 AM  Result Value Ref Range    Glucose-Capillary 224 (H) 70 - 99 mg/dL      Comment: Glucose reference range applies only to samples taken after fasting for at least 8 hours.    Comment 1 Notify RN    Glucose, capillary     Status: Abnormal    Collection Time: 12/10/23 11:57 AM  Result Value Ref Range    Glucose-Capillary 123 (H) 70 - 99 mg/dL      Comment: Glucose reference range applies only to samples taken after fasting for at least 8 hours.  Glucose, capillary     Status: Abnormal    Collection Time: 12/10/23  5:33 PM  Result Value Ref Range    Glucose-Capillary 123 (H) 70 - 99 mg/dL      Comment: Glucose reference range applies only to samples taken after fasting for at least 8 hours.  Glucose, capillary     Status: Abnormal    Collection Time: 12/10/23  9:09 PM  Result Value Ref Range    Glucose-Capillary 225 (H) 70 - 99 mg/dL      Comment: Glucose reference range applies only to samples taken after fasting for at least 8 hours.    Comment 1 Notify RN    Glucose, capillary     Status: Abnormal    Collection Time: 12/11/23  6:37 AM  Result Value Ref Range    Glucose-Capillary 169 (H) 70 - 99 mg/dL      Comment: Glucose reference range applies only to samples taken after fasting for at least 8 hours.    Comment 1 Notify RN    CBC     Status: Abnormal    Collection Time: 12/11/23  7:05 AM  Result Value Ref Range    WBC 13.1 (H) 4.0 - 10.5  K/uL    RBC 4.99 4.22 - 5.81 MIL/uL    Hemoglobin 15.4 13.0 - 17.0 g/dL    HCT 56.0 60.9 -  47.9 %    MCV 88.0 80.0 - 100.0 fL    MCH 30.9 26.0 - 34.0 pg    MCHC 35.1 30.0 - 36.0 g/dL    RDW 86.6 88.4 - 84.4 %    Platelets 285 150 - 400 K/uL    nRBC 0.0 0.0 - 0.2 %      Comment: Performed at [facility], [address], [place], [place] [address]  Basic metabolic panel     Status: Abnormal    Collection Time: 12/11/23  7:05 AM  Result Value Ref Range    Sodium 133 (L) 135 - 145 mmol/L    Potassium 4.0 3.5 - 5.1 mmol/L    Chloride 102 98 - 111 mmol/L    CO2 21 (L) 22 - 32 mmol/L    Glucose, Bld 156 (H) 70 - 99 mg/dL      Comment: Glucose reference range applies only to samples taken after fasting for at least 8 hours.    BUN 28 (H) 8 - 23 mg/dL    Creatinine, Ser 9.17 0.61 - 1.24 mg/dL    Calcium  8.5 (L) 8.9 - 10.3 mg/dL    GFR, Estimated >39 >39 mL/min      Comment: (NOTE) Calculated using the CKD-EPI Creatinine Equation (2021)      Anion gap 10 5 - 15      Comment: Performed at [facility], [address] [place], [place] [address]      Imaging Results (Last 48 hours)  No results found.         Blood pressure 124/71, pulse 74, temperature 98.5 F (36.9 C), temperature source Oral, resp. rate 17, height 5' 11 (1.803 m), weight 96.8 kg, SpO2 97%.   Medical Problem List and Plan: 1. Functional deficits secondary to right MCA infarct due to right ICA and MCA occlusion status post TNK NIR with TICI3 and right carotid stent             -patient may shower             -ELOS/Goals: 18-21 days, goals are supervision with PT, OT, SLP 2.  Antithrombotics: -DVT/anticoagulation:  Pharmaceutical: Lovenox              -antiplatelet therapy: Aspirin  81 mg daily and Brilinta  90 mg twice daily 3. Pain Management: Tylenol  as needed 4. Mood/Behavior/Sleep: Provide emotional support             -may be experiencing some early depression. Told me he regrets not taking care of himself earlier in his life  -monitor for now. Consider SSRI  -neuropsych eval when  provider is available             -antipsychotic agents: N/A 5. Neuropsych/cognition: This patient is capable of making decisions on his own behalf. 6. Skin/Wound Care: Routine skin checks 7. Fluids/Electrolytes/Nutrition: Routine in and outs with follow-up chemistries on Monday 8.  Dysphagia.  Dysphagia #2 nectar thick liquids.   -Continue nasogastric tube for nutritional support over weekend. Consider reducing rate of Osmolite to 40cc/hr to help stimulate PO intake.    -Advance diet per speech therapy 9.  Hyperlipidemia.  Lipitor 10.  GERD.  Nexium  7.  Hypothyroidism.  Synthroid  8.  COPD exacerbation/respiratory distress.  Patient completed 5-day course of Unasyn .  Prednisone  taper.   -pt on  room air currently.       Toribio PARAS Angiulli, PA-C 12/11/2023  I have personally performed a face to face diagnostic evaluation of this patient and formulated the key components of the plan.  Additionally, I have personally reviewed laboratory data, imaging studies, as well as relevant notes and concur with the physician assistant's documentation above.  The patient's status has not changed from the original H&P.  Any changes in documentation from the acute care chart have been noted above.  Arthea IVAR Gunther, MD, LEELLEN

## 2023-12-11 NOTE — Progress Notes (Signed)
 Patient discharged to [facility] - report given to RN.

## 2023-12-11 NOTE — Progress Notes (Addendum)
 Nutrition initial assessment  DOCUMENTATION CODES:   Not applicable  INTERVENTION:   -Continue tube feeding via Cortrak: Wound continue tube feeding until patient demonstrates intakes meeting at least 65% estimated needs. Osmolite 1.5 at 80 ml/h x 15 hours (1200 ml per day) Prosource TF20 60 ml BID  Provides 1960 kcal, 115 gm protein, 914 ml free water  daily, additional free water  of 150 mL Q4 hours.  -Dysphagia II diet with nectar thick liquids.  -Ensure Enlive po BID, each supplement provides 350 kcal and 20 grams of protein.  -Calorie Count x 48 hours, RD to follow up on results 1/13.   NUTRITION DIAGNOSIS:   Inadequate oral intake related to dysphagia, acute illness as evidenced by meal completion < 50%, other (comment) (need for tube feeding).    GOAL:   Patient will meet greater than or equal to 90% of their needs    MONITOR:   PO intake, Weight trends, Supplement acceptance, TF tolerance, Diet advancement, Skin, Labs  REASON FOR ASSESSMENT:   Consult Enteral/tube feeding initiation and management  ASSESSMENT: 63 y/o male admitted with right middle cerebral artery stroke. PMH: GERD, hypothyroidism, remote seizure, PAD with bilateral femoral artery bypass, smoking hx.    12/30 - Intubated, TNKase  and mechanical thrombectomy with right carotid stenting and angioplasty, NPO  12/31 - Extubated, NPO 1/2 - Hypertonic saline discontinued 1/3 - MBS, DYS1 diet, honey thick liquids, Cortrak placed  1/7 - MBS, DYS1 diet, nectar thick liquids, adjusted to nocturnal 1/10-diet changed to dysphagia II per SLP   Patient informs he had a good appetite and was eating well prior to admission.  Patient was transferred to rehab today. PO intakes have been minimal during acute hospital stay mostly 0 with 40% x 1 meal. Currently TF ordered Osmolite 1.5 ordered 80 mL/hr x 15 hours.   Medications reviewed and include colace, novolog  SS 3x daily with meals, synthroid ,  prednisone .  Labs: CBG 138-225 past 24 hours   NUTRITION - FOCUSED PHYSICAL EXAM:  Flowsheet Row Most Recent Value  Orbital Region No depletion  Upper Arm Region No depletion  Thoracic and Lumbar Region No depletion  Buccal Region No depletion  Temple Region No depletion  Clavicle Bone Region No depletion  Clavicle and Acromion Bone Region No depletion  Scapular Bone Region No depletion  Dorsal Hand No depletion  Patellar Region No depletion  Anterior Thigh Region No depletion  Posterior Calf Region No depletion  Edema (RD Assessment) Mild  Hair Reviewed  Eyes Reviewed  Mouth Reviewed  Skin Reviewed  Nails Reviewed       Diet Order:   Diet Order             DIET DYS 2 Room service appropriate? Yes with Assist; Fluid consistency: Nectar Thick  Diet effective now                   EDUCATION NEEDS:   Not appropriate for education at this time  Skin:  Skin Assessment: Skin Integrity Issues: Incisions: R thigh  Last BM:  12/11/2023, type 6-large  Height:   Ht Readings from Last 1 Encounters:  12/11/23 5' 10 (1.778 m)    Weight:   Wt Readings from Last 1 Encounters:  12/11/23 89.4 kg    Ideal Body Weight:  75.5 kg  BMI:  Body mass index is 28.28 kg/m.  Estimated Nutritional Needs:   Kcal:  2000-2250 kcal/day  Protein:  98-113 gm/day  Fluid:  2000-2250 mL/day    Elveria  Lebaron Bautch, RDLD Clinical Dietitian If unable to reach, please contact RD Inpatient secure chat group between 8 am-4 pm daily

## 2023-12-11 NOTE — Progress Notes (Signed)
 PROGRESS NOTE   Ian Wong  FMW:[id number]    DOB: 1961-05-06    DOA: 11/30/2023  PCP: Patient, No Pcp Per   I have briefly reviewed patients previous medical records in [facility].  Chief Complaint  Patient presents with   Code Stroke    Brief Hospital Course:  63 year old with history of peripheral artery disease, Larrick syndrome, GERD, hypothyroidism who presented to [facility] with persistent right gaze deviation and left hemiaplasia.  CT head was negative.  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  He was given TNK at [facility] and was transferred to [facility] for emergent thrombectomy.  Patient was intubated after mechanical thrombectomy and subsequently extubated next day.  He was transferred to medical floor. TRH was consulted on 1/5 for ongoing medical management, wheezing and stridor.  Initiated slow prednisone  taper, wheezing has resolved today.  Remains on nightly tube feeds via core track and daytime p.o. intake but reportedly not taking adequately, encouraged.  Appears to have IR bed and insurance approval for DC.  From a medical standpoint, he can be discharged to CIR.   Assessment & Plan:  Principal Problem:   Acute ischemic right MCA stroke [facility]) Active Problems:   Acute right MCA stroke (HCC)   Acute hypoxic respiratory failure (HCC)   Acute metabolic encephalopathy   Aspiration pneumonia (HCC)   Acute respiratory failure with hypoxia/stridor Concern for aspiration during intubation and patient remained intubated postprocedure by IR Extubated 12/31 Continued to be mildly tachypneic in the low 20s with 2 L/min [place] O2 needs on 1/5. Completed 5-day course of Unasyn  for suspected aspiration and 3-day course of prednisone  for suspected COPD exacerbation Negative COVID/RVP/pro calcitonin.  Chest x-ray recently clear. CT scan soft tissue of neck without complications or swelling Acute respiratory failure has resolved and so has stridor. Given ongoing  some wheezing 1/9, started low-dose prednisone  taper over 6 days.  Wheezing seems to have resolved today.  Steroids may certainly help his appetite some.  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  Treated with TNK and then with thrombectomy.  Repeat CT head stable. Echocardiogram with ejection fraction 50 to 55%. LDL 57 A1c 5.9 Previously on aspirin .  Currently on aspirin  and Brilinta  for 90 days for carotid stenting. Followed by neurology.  Per CIR, have bed and insurance approval.  Defer to attending team regarding discharge. Dysphagia due to stroke.  Core track placed 1/3.  As per attending note yesterday, tube feeds changed to nighttime only with p.o. feeds in the daytime. Per nursing report, not taking by mouth enough.  Patient encouraged to do so.  Dysphagia Secondary to acute stroke SLP managing diet, currently on dysphagia 2 diet and nectar thickened liquids.  No documentation of how much she has taken p.o. Tube feeds (via core track) changed to at night (6 PM to 9 AM) and p.o. diet in the daytime. A1c 5.9.  On SSI only because he is on tube feeds.  CBGs labile and mildly uncontrolled up to 224.  Initiation of prednisone  may worsen glycemic control.  Continue current insulin  regimen.   Essential hypertension,  hypotensive on arrival:  Blood pressure is stabilized now.  Off antihypertensives.   Hypothyroidism:  On Synthroid , continue.  History of seizure:  No recent known seizures.  Body mass index is 29.75 kg/m./Overweight  Nutritional Status Nutrition Problem: Predicted suboptimal nutrient intake Etiology: acute illness Signs/Symptoms: other (comment) (dysphgia 1 diet  with honecy thick lqiuids) Interventions: Refer to RD note for recommendations   DVT prophylaxis: enoxaparin  (LOVENOX ) injection 40 mg Start: 12/03/23 1100 SCD's Start: 11/30/23 1607     Code Status: Full Code:  Family  Communication: None at bedside. Disposition:  Stable from a medical standpoint for discharge to CIR.  Defer final decision to attending MD.     Consultants:   TRH are consultants  Procedures:     Antimicrobials:      Subjective:  Denies complaints.  Denies dyspnea or wheezing.  Reports that he ate some potatoes and gravy yesterday.  Had a BM this morning.  Per nurse, poor oral intake.  Objective:   Vitals:   12/10/23 2342 12/11/23 0149 12/11/23 0344 12/11/23 0728  BP: 126/68  135/77 124/71  Pulse: 78 81 79 74  Resp: 16 (!) 21 16 17   Temp: 97.9 F (36.6 C)  98.2 F (36.8 C) 98.5 F (36.9 C)  TempSrc: Oral  Oral Oral  SpO2: 98% 97% 97% 97%  Weight:      Height:        General exam: Middle-age male, moderately built and nourished lying comfortably propped up in bed without distress ENT: Right nostril core track Respiratory system: Clear to auscultation without wheezing, rhonchi or crackles.  No increased work of breathing.  On room air. Cardiovascular system: S1 & S2 heard, RRR. No JVD, murmurs, rubs, gallops or clicks. No pedal edema.  Gastrointestinal system: Abdomen is nondistended, soft and nontender. No organomegaly or masses felt. Normal bowel sounds heard. Central nervous system: Alert and oriented. No focal neurological deficits. Extremities: Right limbs grade 5 x 5 power, LUE 4/5 at least, LLE 4/5 at least. Skin: No rashes, lesions or ulcers Psychiatry: Judgement and insight appear normal. Mood & affect appropriate.     Data Reviewed:   I have personally reviewed following labs and imaging studies   CBC: Recent Labs  Lab 12/08/23 0527 12/09/23 0617 12/11/23 0705  WBC 10.9* 10.9* 13.1*  HGB 14.1 14.1 15.4  HCT 40.2 41.0 43.9  MCV 88.2 88.9 88.0  PLT 164 213 285    Basic Metabolic Panel: Recent Labs  Lab 12/06/23 0613 12/07/23 0624 12/08/23 0527 12/09/23 0617 12/11/23 0705  NA 141 137 138 136 133*  K 3.8 3.9 4.2 4.1 4.0  CL 112* 108 107  103 102  CO2 19* 20* 22 22 21*  GLUCOSE 167* 181* 114* 149* 156*  BUN 25* 29* 29* 31* 28*  CREATININE 0.88 0.83 0.86 1.01 0.82  CALCIUM  8.6* 8.5* 8.4* 8.4* 8.5*    Liver Function Tests: No results for input(s): AST, ALT, ALKPHOS, BILITOT, PROT, ALBUMIN  in the last 168 hours.  CBG: Recent Labs  Lab 12/10/23 1733 12/10/23 2109 12/11/23 0637  GLUCAP 123* 225* 169*    Microbiology Studies:   Recent Results (from the past 240 hours)  Respiratory (~20 pathogens) panel by PCR     Status: None   Collection Time: 12/06/23  6:24 PM   Specimen: Nasopharyngeal Swab; Respiratory  Result Value Ref Range Status   Adenovirus NOT DETECTED NOT DETECTED Final   Coronavirus 229E NOT DETECTED NOT DETECTED Final    Comment: (NOTE) The Coronavirus on the Respiratory Panel, DOES NOT test for the novel  Coronavirus (2019 nCoV)    Coronavirus HKU1 NOT DETECTED NOT DETECTED Final   Coronavirus NL63 NOT DETECTED NOT DETECTED Final   Coronavirus OC43 NOT DETECTED NOT DETECTED Final   Metapneumovirus NOT DETECTED NOT DETECTED Final  Rhinovirus / Enterovirus NOT DETECTED NOT DETECTED Final   Influenza A NOT DETECTED NOT DETECTED Final   Influenza B NOT DETECTED NOT DETECTED Final   Parainfluenza Virus 1 NOT DETECTED NOT DETECTED Final   Parainfluenza Virus 2 NOT DETECTED NOT DETECTED Final   Parainfluenza Virus 3 NOT DETECTED NOT DETECTED Final   Parainfluenza Virus 4 NOT DETECTED NOT DETECTED Final   Respiratory Syncytial Virus NOT DETECTED NOT DETECTED Final   Bordetella pertussis NOT DETECTED NOT DETECTED Final   Bordetella Parapertussis NOT DETECTED NOT DETECTED Final   Chlamydophila pneumoniae NOT DETECTED NOT DETECTED Final   Mycoplasma pneumoniae NOT DETECTED NOT DETECTED Final    Comment: Performed at [facility], [address], [place], [place] [address]    Radiology Studies:  No results found.  Scheduled Meds:    aspirin   81 mg Oral Daily   atorvastatin   20  mg Oral QPM   Chlorhexidine  Gluconate Cloth  6 each Topical Daily   docusate  100 mg Oral Daily   enoxaparin  (LOVENOX ) injection  40 mg Subcutaneous Q24H   esomeprazole   20 mg Oral QAC breakfast   feeding supplement  237 mL Oral TID BM   feeding supplement (OSMOLITE 1.5 CAL)  1,000 mL Per Tube Q24H   feeding supplement (PROSource TF20)  60 mL Per Tube BID   free water   150 mL Per Tube Q4H   insulin  aspart  0-6 Units Subcutaneous TID WC   ipratropium-albuterol   3 mL Nebulization Q6H WA   levothyroxine   50 mcg Oral Daily   mouth rinse  15 mL Mouth Rinse 4 times per day   [START ON 12/12/2023] predniSONE   20 mg Oral Q breakfast   Followed by   NOREEN ON 12/14/2023] predniSONE   10 mg Oral Q breakfast   ticagrelor   90 mg Oral BID    Continuous Infusions:     LOS: 11 days     Trenda Mar, MD,  FACP, [facility], [facility], [facility]   [facility] [place]      To contact the attending provider between 7A-7P or the covering provider during after hours 7P-7A, please log into the web site [email or web address] and access using universal [place] password for that web site. If you do not have the password, please call the hospital operator.  12/11/2023, 9:45 AM

## 2023-12-12 DIAGNOSIS — R030 Elevated blood-pressure reading, without diagnosis of hypertension: Secondary | ICD-10-CM | POA: Diagnosis not present

## 2023-12-12 DIAGNOSIS — I63511 Cerebral infarction due to unspecified occlusion or stenosis of right middle cerebral artery: Secondary | ICD-10-CM | POA: Diagnosis not present

## 2023-12-12 LAB — GLUCOSE, CAPILLARY
Glucose-Capillary: 205 mg/dL — ABNORMAL HIGH (ref 70–99)
Glucose-Capillary: 152 mg/dL — ABNORMAL HIGH (ref 70–99)
Glucose-Capillary: 112 mg/dL — ABNORMAL HIGH (ref 70–99)
Glucose-Capillary: 130 mg/dL — ABNORMAL HIGH (ref 70–99)

## 2023-12-12 NOTE — Progress Notes (Addendum)
 PROGRESS NOTE   Subjective/Complaints:  Pt doing ok, slept well, denies pain, LBM this morning, urinating fine, denies any other complaints or concerns today.   ROS: as per HPI. Denies CP, SOB, abd pain, N/V/D/C, or any other complaints at this time.    Objective:   No results found. Recent Labs    12/11/23 0705  WBC 13.1*  HGB 15.4  HCT 43.9  PLT 285   Recent Labs    12/11/23 0705  NA 133*  K 4.0  CL 102  CO2 21*  GLUCOSE 156*  BUN 28*  CREATININE 0.82  CALCIUM  8.5*         Intake/Output Summary (Last 24 hours) at 12/12/2023 1055 Last data filed at 12/11/2023 1855 Gross per 24 hour  Intake 22.67 ml  Output --  Net 22.67 ml        Physical Exam: Vital Signs Blood pressure (!) 155/79, pulse 76, temperature 98.4 F (36.9 C), resp. rate 16, height 5' 10 (1.778 m), weight 89.4 kg, SpO2 95%.  Constitutional:      General: He is not in acute distress. Resting in bed.  HENT:     Head: Normocephalic.     Right Ear: External ear normal.     Left Ear: External ear normal.     Nose:     Comments: NGT Eyes:     Conjunctiva/sclera: Conjunctivae normal.     Pupils: Pupils are equal, round, and reactive to light.  Cardiovascular:     Rate and Rhythm: Normal rate and regular rhythm.     Heart sounds: No murmur heard.    No gallop.  Pulmonary:     Effort: Pulmonary effort is normal. No respiratory distress.     Breath sounds: No wheezing.  Abdominal:     General: Bowel sounds are normal. There is no distension.     Palpations: Abdomen is soft.     Tenderness: There is no abdominal tenderness.  Skin:    General: Skin is warm and dry.  Psychiatric:     Comments: Flat, slow to engage, but pleasant and cooperative  PRIOR EXAMS: Musculoskeletal:       General: No swelling or tenderness.     Cervical back: Normal range of motion.  Neurological:     Mental Status: He is alert.     Comments: Pt is  alert. Abulic. Oriented to person, place, month/year and reason. Pt demonstrates fair insight and awareness. Able to provide some limited biographical information. Right gaze preference. Can come to the left with cueing. Speech with moderate volume but dysarthric. Left central 7. Decreased sensation of left face. 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 MMT: LUE is 2/5 deltoids, biceps, triceps and 2+ left wrist and hand. LLE appears to be 4- to 4/5. Sensation is 1+/2 Left arm and leg. DTR's 3+ left arm and leg. No abnl resting tone.     Assessment/Plan: 1. Functional deficits which require 3+ hours per day of interdisciplinary therapy in a comprehensive inpatient rehab setting. Physiatrist is providing close team supervision and 24 hour management of active medical problems listed below. Physiatrist and rehab team continue to assess barriers to discharge/monitor  patient progress toward functional and medical goals  Care Tool:  Bathing              Bathing assist       Upper Body Dressing/Undressing Upper body dressing        Upper body assist      Lower Body Dressing/Undressing Lower body dressing            Lower body assist       Toileting Toileting    Toileting assist       Transfers Chair/bed transfer  Transfers assist           Locomotion Ambulation   Ambulation assist              Walk 10 feet activity   Assist           Walk 50 feet activity   Assist           Walk 150 feet activity   Assist           Walk 10 feet on uneven surface  activity   Assist           Wheelchair     Assist               Wheelchair 50 feet with 2 turns activity    Assist            Wheelchair 150 feet activity     Assist          Blood pressure (!) 155/79, pulse 76, temperature 98.4 F (36.9 C), resp. rate 16, height 5' 10 (1.778 m), weight 89.4 kg, SpO2 95%.  Medical Problem List and  Plan: 1. Functional deficits secondary to right MCA infarct due to right ICA and MCA occlusion status post TNK NIR with TICI3 and right carotid stent             -patient may shower             -ELOS/Goals: 18-21 days, goals are supervision with PT, OT, SLP  -CIR evals today 2.  Antithrombotics: -DVT/anticoagulation:  Pharmaceutical: Lovenox  40mg  daily             -antiplatelet therapy: Aspirin  81 mg daily and Brilinta  90 mg twice daily 3. Pain Management: Tylenol  as needed 4. Mood/Behavior/Sleep: Provide emotional support  -may be experiencing some early depression. Told me he regrets not taking care of himself earlier in his life             -monitor for now. Consider SSRI             -neuropsych eval when provider is available             -antipsychotic agents: N/A 5. Neuropsych/cognition: This patient is capable of making decisions on his own behalf. 6. Skin/Wound Care: Routine skin checks 7. Fluids/Electrolytes/Nutrition: Routine in and outs with follow-up chemistries on Monday 8.  Dysphagia.  Dysphagia #2 nectar thick liquids.   -Continue nasogastric tube for nutritional support over weekend. Consider reducing rate of Osmolite to 40cc/hr to help stimulate PO intake.    -Advance diet per speech therapy -12/12/23 leave rate for now, no documentation of intake and pt just arrived; monitor; CBGs up but probably from TF/steroids? No dx of DM mentioned. Continue SSI CBG (last 3)  Recent Labs    12/11/23 1139 12/11/23 1620 12/12/23 0849  GLUCAP 194* 138* 205*    9.  Hyperlipidemia.  Lipitor 20mg  daily 10.  GERD.  Nexium  20mg  daily 11.  Hypothyroidism.  Synthroid  50mcg daily 12.  COPD exacerbation/respiratory distress.  Patient completed 5-day course of Unasyn .  Prednisone  taper.   -pt on room air currently. 13. HTN?: unclear if this is a new diagnosis? No mention in notes. BPs elevated overnight. Monitor for now  Vitals:   12/11/23 1557 12/11/23 1915 12/12/23 0641  BP: (!) 144/76  130/71 (!) 155/79    14. Bowel regimen: continue colace 100mg  daily.   -12/12/23 LBM this morning  LOS: 1 days A FACE TO FACE EVALUATION WAS PERFORMED  [address] 12/12/2023, 10:55 AM

## 2023-12-12 NOTE — Plan of Care (Signed)
  Problem: RH Balance Goal: LTG Patient will maintain dynamic standing balance (PT) Description: LTG:  Patient will maintain dynamic standing balance with assistance during mobility activities (PT) Flowsheets (Taken 12/12/2023 1253) LTG: Pt will maintain dynamic standing balance during mobility activities with:: Supervision/Verbal cueing   Problem: Sit to Stand Goal: LTG:  Patient will perform sit to stand with assistance level (PT) Description: LTG:  Patient will perform sit to stand with assistance level (PT) Flowsheets (Taken 12/12/2023 1253) LTG: PT will perform sit to stand in preparation for functional mobility with assistance level: Supervision/Verbal cueing   Problem: RH Bed Mobility Goal: LTG Patient will perform bed mobility with assist (PT) Description: LTG: Patient will perform bed mobility with assistance, with/without cues (PT). Flowsheets (Taken 12/12/2023 1253) LTG: Pt will perform bed mobility with assistance level of: Independent   Problem: RH Bed to Chair Transfers Goal: LTG Patient will perform bed/chair transfers w/assist (PT) Description: LTG: Patient will perform bed to chair transfers with assistance (PT). Flowsheets (Taken 12/12/2023 1253) LTG: Pt will perform Bed to Chair Transfers with assistance level: Supervision/Verbal cueing   Problem: RH Car Transfers Goal: LTG Patient will perform car transfers with assist (PT) Description: LTG: Patient will perform car transfers with assistance (PT). Flowsheets (Taken 12/12/2023 1253) LTG: Pt will perform car transfers with assist:: Supervision/Verbal cueing   Problem: RH Ambulation Goal: LTG Patient will ambulate in controlled environment (PT) Description: LTG: Patient will ambulate in a controlled environment, # of feet with assistance (PT). Flowsheets (Taken 12/12/2023 1253) LTG: Pt will ambulate in controlled environ  assist needed:: Supervision/Verbal cueing LTG: Ambulation distance in controlled environment:  300' Goal: LTG Patient will ambulate in home environment (PT) Description: LTG: Patient will ambulate in home environment, # of feet with assistance (PT). Flowsheets (Taken 12/12/2023 1253) LTG: Pt will ambulate in home environ  assist needed:: Supervision/Verbal cueing LTG: Ambulation distance in home environment: 50'   Problem: RH Stairs Goal: LTG Patient will ambulate up and down stairs w/assist (PT) Description: LTG: Patient will ambulate up and down # of stairs with assistance (PT) Flowsheets (Taken 12/12/2023 1253) LTG: Pt will ambulate up/down stairs assist needed:: Supervision/Verbal cueing LTG: Pt will  ambulate up and down number of stairs: 12 Note: For community reintegration   Problem: RH Pre-functional/Other (Specify) Goal: RH LTG PT (Specify) 1 Description:  RH LTG PT (Specify) 1 Flowsheets (Taken 12/12/2023 1253) LTG: Other PT (Specify) 1: Pt will ambulate up/down ramp with supervision and LRAD to simulate entry into home.

## 2023-12-12 NOTE — Plan of Care (Signed)
 Problem: RH Balance Goal: LTG: Patient will maintain dynamic sitting balance (OT) Description: LTG:  Patient will maintain dynamic sitting balance with assistance during activities of daily living (OT) Flowsheets (Taken 12/12/2023 1723) LTG: Pt will maintain dynamic sitting balance during ADLs with: Independent Goal: LTG Patient will maintain dynamic standing with ADLs (OT) Description: LTG:  Patient will maintain dynamic standing balance with assist during activities of daily living (OT)  Flowsheets (Taken 12/12/2023 1723) LTG: Pt will maintain dynamic standing balance during ADLs with: Supervision/Verbal cueing   Problem: Sit to Stand Goal: LTG:  Patient will perform sit to stand in prep for activites of daily living with assistance level (OT) Description: LTG:  Patient will perform sit to stand in prep for activites of daily living with assistance level (OT) Flowsheets (Taken 12/12/2023 1723) LTG: PT will perform sit to stand in prep for activites of daily living with assistance level: Supervision/Verbal cueing   Problem: RH Eating Goal: LTG Patient will perform eating w/assist, cues/equip (OT) Description: LTG: Patient will perform eating with assist, with/without cues using equipment (OT) Flowsheets (Taken 12/12/2023 1723) LTG: Pt will perform eating with assistance level of: Independent   Problem: RH Grooming Goal: LTG Patient will perform grooming w/assist,cues/equip (OT) Description: LTG: Patient will perform grooming with assist, with/without cues using equipment (OT) Flowsheets (Taken 12/12/2023 1723) LTG: Pt will perform grooming with assistance level of: Supervision/Verbal cueing   Problem: RH Bathing Goal: LTG Patient will bathe all body parts with assist levels (OT) Description: LTG: Patient will bathe all body parts with assist levels (OT) Flowsheets (Taken 12/12/2023 1723) LTG: Pt will perform bathing with assistance level/cueing: Supervision/Verbal cueing LTG: Position  pt will perform bathing: Shower   Problem: RH Dressing Goal: LTG Patient will perform upper body dressing (OT) Description: LTG Patient will perform upper body dressing with assist, with/without cues (OT). Flowsheets (Taken 12/12/2023 1723) LTG: Pt will perform upper body dressing with assistance level of: Set up assist Goal: LTG Patient will perform lower body dressing w/assist (OT) Description: LTG: Patient will perform lower body dressing with assist, with/without cues in positioning using equipment (OT) Flowsheets (Taken 12/12/2023 1723) LTG: Pt will perform lower body dressing with assistance level of: Supervision/Verbal cueing   Problem: RH Toileting Goal: LTG Patient will perform toileting task (3/3 steps) with assistance level (OT) Description: LTG: Patient will perform toileting task (3/3 steps) with assistance level (OT)  Flowsheets (Taken 12/12/2023 1723) LTG: Pt will perform toileting task (3/3 steps) with assistance level: Supervision/Verbal cueing   Problem: RH Functional Use of Upper Extremity Goal: LTG Patient will use RT/LT upper extremity as a (OT) Description: LTG: Patient will use right/left upper extremity as a stabilizer/gross assist/diminished/nondominant/dominant level with assist, with/without cues during functional activity (OT) Flowsheets (Taken 12/12/2023 1723) LTG: Use of upper extremity in functional activities: LUE as diminished level LTG: Pt will use upper extremity in functional activity with assistance level of: Independent   Problem: RH Toilet Transfers Goal: LTG Patient will perform toilet transfers w/assist (OT) Description: LTG: Patient will perform toilet transfers with assist, with/without cues using equipment (OT) Flowsheets (Taken 12/12/2023 1723) LTG: Pt will perform toilet transfers with assistance level of: Supervision/Verbal cueing   Problem: RH Tub/Shower Transfers Goal: LTG Patient will perform tub/shower transfers w/assist (OT) Description:  LTG: Patient will perform tub/shower transfers with assist, with/without cues using equipment (OT) Flowsheets (Taken 12/12/2023 1723) LTG: Pt will perform tub/shower stall transfers with assistance level of: Contact Guard/Touching assist LTG: Pt will perform tub/shower transfers from: Tub/shower  combination   Problem: RH Memory Goal: LTG Patient will demonstrate ability for day to day recall/carry over during activities of daily living with assistance level (OT) Description: LTG:  Patient will demonstrate ability for day to day recall/carry over during activities of daily living with assistance level (OT). Flowsheets (Taken 12/12/2023 1723) LTG:  Patient will demonstrate ability for day to day recall/carry over during activities of daily living with assistance level (OT): Supervision   Problem: RH Awareness Goal: LTG: Patient will demonstrate awareness during functional activites type of (OT) Description: LTG: Patient will demonstrate awareness during functional activites type of (OT) Flowsheets (Taken 12/12/2023 1723) Patient will demonstrate awareness during functional activites type of: Emergent LTG: Patient will demonstrate awareness during functional activites type of (OT): Supervision

## 2023-12-12 NOTE — Evaluation (Signed)
 Occupational Therapy Assessment and Plan  Patient Details  Name: Ian Wong MRN: [id number] Date of Birth: December 24, 1960  OT Diagnosis: cognitive deficits, disturbance of vision, and hemiplegia affecting non-dominant side Rehab Potential: Rehab Potential (ACUTE ONLY): Good ELOS: 12-14 days   Today's Date: 12/12/2023 OT Individual Time: 1300 - 1415 (75 minutes)       Hospital Problem: Principal Problem:   Right middle cerebral artery stroke [facility])   Past Medical History:  Past Medical History:  Diagnosis Date   GERD (gastroesophageal reflux disease)    Hypothyroidism    Seizures (HCC)    Past Surgical History:  Past Surgical History:  Procedure Laterality Date   AORTA - BILATERAL FEMORAL ARTERY BYPASS GRAFT Bilateral 01/07/2017   Procedure: AORTOBIFEMORAL BYPASS GRAFT;  Surgeon: Krystal JULIANNA Doing, MD;  Location: [facility] OR;  Service: Vascular;  Laterality: Bilateral;   I & D EXTREMITY Left 11/28/2014   Procedure: IRRIGATION AND DEBRIDEMENT;  REPAIR OF CRUSH INJURY;  Surgeon: Franky Curia, MD;  Location: [facility] OR;  Service: Orthopedics;  Laterality: Left;   INCISIONAL HERNIA REPAIR N/A 10/26/2017   Procedure: HERNIA REPAIR INCISIONAL WITH MESH;  Surgeon: Mavis Anes, MD;  Location: [facility] ORS;  Service: General;  Laterality: N/A;   IR CT HEAD LTD  11/30/2023   IR INTRAVSC STENT CERV CAROTID W/EMB-PROT MOD SED INCL ANGIO  11/30/2023   IR PERCUTANEOUS ART THROMBECTOMY/INFUSION INTRACRANIAL INC DIAG ANGIO  11/30/2023   IR US  GUIDE VASC ACCESS RIGHT  11/30/2023   RADIOLOGY WITH ANESTHESIA N/A 11/30/2023   Procedure: IR WITH ANESTHESIA;  Surgeon: Radiologist, Medication, MD;  Location: [facility] OR;  Service: Radiology;  Laterality: N/A;    Assessment & Plan Clinical Impression: .Ian Wong is a 63 year old right-handed male with history significant for GERD, hypothyroidism, remote seizure, PAD with bilateral femoral artery bypass 2018, quit smoking 7 years ago. Per chart review patient lives with  spouse. Mobile home with ramped entrance. Independent prior to admission. They do have a son that is disabled. Presented to [facility] 11/30/2023 with right gaze deviation and left hemiplegia/slurred speech of acute onset. Cranial CT scan showed hyperdense right M1 MCA concerning for thrombus. 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 Patient did receive TNK at [facility]. CT angiogram of the head and neck occluded right ICA at its origin with nonopacification throughout the neck. Severe right vertebral artery origin stenosis. Severe stenosis of the intradural small/nondominant left vertebral artery. Approximately 70% stenosis of the left ICA origin. Admission chemistries unremarkable except WBC 14,400, hemoglobin A1c 5.9. Patient was transferred to [facility] and underwent cerebral angiogram mechanical thrombectomy as well as right carotid stenting and angioplasty 11/30/2023 per Dr.de Macedo Rodriguez. Follow-up MRI showed fairly extensive acute right middle cerebral artery territory infarcts. Few nonspecific punctate chronic microhemorrhages scattered elsewhere within the supratentorial brain. Chronic lacunar infarct within the left thalamus. Echocardiogram ejection fraction of 50 to 55% no wall motion abnormalities. Follow-up placed on aspirin  81 mg daily as well as Brilinta  90 mg twice daily for CVA prophylaxis. Lovenox  for DVT prophylaxis. Hospital course dysphagia currently on dysphagia #2 nectar thick liquid diet with nasogastric tube feeds for nutritional support. There was question of COPD exacerbation respiratory distress some exertional wheezing placed on prednisone  with taper completing a 5-day course of Unasyn . He was being weaned from nasal cannula oxygen with latest chest x-ray showing mild left basilar atelectasis. Patient with mild hyponatremia 133 and monitored. Therapy evaluations completed due to patient decreased functional mobility and  left-sided weakness was  admitted for a comprehensive rehab program.   Patient transferred to CIR on 12/11/2023 .    Patient currently requires mod with basic self-care skills secondary to decreased cardiorespiratoy endurance, unbalanced muscle activation and decreased coordination, decreased visual perceptual skills and decreased visual motor skills, decreased attention to left, decreased awareness, decreased problem solving, and decreased memory, and decreased sitting balance, decreased standing balance, decreased postural control, hemiplegia, and decreased balance strategies.  Prior to hospitalization, patient was fully independent and working full time as a financial trader.   Patient will benefit from skilled intervention to increase independence with basic self-care skills prior to discharge home with care partner.  Anticipate patient will require intermittent supervision and follow up home health.  OT - End of Session Endurance Deficit Description: requires rest breaks with mobility tasks OT Assessment Rehab Potential (ACUTE ONLY): Good OT Patient demonstrates impairments in the following area(s): Balance;Cognition;Endurance;Motor;Perception;Safety;Sensory;Vision OT Basic ADL's Functional Problem(s): Eating;Grooming;Bathing;Dressing;Toileting OT Transfers Functional Problem(s): Toilet;Tub/Shower OT Additional Impairment(s): Fuctional Use of Upper Extremity OT Plan OT Intensity: Minimum of 1-2 x/day, 45 to 90 minutes OT Frequency: 5 out of 7 days OT Duration/Estimated Length of Stay: 12-14 days OT Treatment/Interventions: Balance/vestibular training;Cognitive remediation/compensation;DME/adaptive equipment instruction;Discharge planning;Functional mobility training;Neuromuscular re-education;Psychosocial support;Patient/family education;Pain management;Self Care/advanced ADL retraining;UE/LE Strength taining/ROM;Therapeutic Exercise;Therapeutic Activities;UE/LE Coordination activities;Visual/perceptual  remediation/compensation OT Self Feeding Anticipated Outcome(s): independent OT Basic Self-Care Anticipated Outcome(s): supervision OT Toileting Anticipated Outcome(s): supervision OT Bathroom Transfers Anticipated Outcome(s): supervision to toilet, CGA with tub transfers OT Recommendation Patient destination: Home Follow Up Recommendations: Home health OT Equipment Recommended: To be determined Equipment Details: shower seat vs tub bench   OT Evaluation Precautions/Restrictions  Precautions Precautions: Fall Precaution Comments: L inattention, coretrak Restrictions Weight Bearing Restrictions Per Provider Order: No    Vital Signs Therapy Vitals Temp: 98.7 F (37.1 C) Temp Source: Oral Pulse Rate: 80 Resp: 18 BP: (!) 148/70 Patient Position (if appropriate): Lying Oxygen Therapy SpO2: 99 % O2 Device: Room Air Pain Pain Assessment Pain Score: 0-No pain Home Living/Prior Functioning Home Living Available Help at Discharge: Available 24 hours/day, Family Type of Home: Mobile home Home Access: Ramped entrance Home Layout: One level Bathroom Shower/Tub: [employer] Accessibility: Yes Additional Comments: wife is home with son with physical disability during the day  Lives With: Spouse, Son Prior Function Level of Independence: Independent with basic ADLs, Independent with gait, Independent with transfers, Independent with homemaking with ambulation  Able to Take Stairs?: Reciprically Driving: Yes Vocation: Full time employment Vocation Requirements: works as a Teacher, English As A Foreign Language: 1 Wears glasses (bifocals, but do not seem to be fitting his face well) Ability to See in Adequate Light: 1 Impaired Patient Visual Report: No change from baseline Vision Assessment?: Wears glasses for reading;Wears glasses for driving;Yes Eye Alignment: Within Functional Limits Ocular Range of Motion: Restricted on the  left Alignment/Gaze Preference: Gaze right Tracking/Visual Pursuits: Decreased smoothness of horizontal tracking;Decreased smoothness of vertical tracking;Left eye does not track laterally;Decreased smoothness of eye movement to LEFT superior field;Decreased smoothness of eye movement to LEFT inferior field Convergence: Impaired (comment) (R eye not adducting) Visual Fields: Impaired-to be further tested in functional context Perception  Perception: Impaired Perception-Other Comments: mild L inattention Praxis Praxis: WFL Cognition Cognition Overall Cognitive Status: Impaired/Different from baseline Arousal/Alertness: Awake/alert Orientation Level: Person;Place;Situation Person: Oriented Place: Oriented Situation: Oriented Memory: Impaired Memory Impairment: Storage deficit Attention: Sustained Sustained Attention: Impaired Sustained Attention Impairment: Verbal basic;Functional basic Awareness: Impaired Awareness Impairment: Intellectual impairment Problem  Solving: Impaired Problem Solving Impairment: Verbal complex;Functional complex Brief Interview for Mental Status (BIMS) Repetition of Three Words (First Attempt): 3 Temporal Orientation: Year: Correct Temporal Orientation: Month: Accurate within 5 days Temporal Orientation: Day: Correct Recall: Sock: Yes, after cueing (something to wear) Recall: Blue: Yes, no cue required Recall: Bed: Yes, after cueing (a piece of furniture) BIMS Summary Score: 13 Sensation Sensation Light Touch: Impaired by gross assessment Hot/Cold: Impaired by gross assessment Proprioception: Impaired by gross assessment Stereognosis: Impaired by gross assessment Coordination Gross Motor Movements are Fluid and Coordinated: No Fine Motor Movements are Fluid and Coordinated: No Coordination and Movement Description: L hemiparesis, decreased smoothness and accuracy Finger Nose Finger Test: unable to with LUE,  WFL RUE Motor   Motor Motor: Hemiplegia Motor - Skilled Clinical Observations: L hemiparesis LUE/LLE  Trunk/Postural Assessment  Postural Control Righting Reactions: delayed and inadequate Protective Responses: decreased Postural Limitations: decreased  Balance Static Sitting Balance Static Sitting - Level of Assistance: 5: Stand by assistance Dynamic Sitting Balance Dynamic Sitting - Level of Assistance: 4: Min assist Static Standing Balance Static Standing - Level of Assistance: 4: Min assist Dynamic Standing Balance Dynamic Standing - Level of Assistance: 3: Mod assist Extremity/Trunk Assessment RUE Assessment RUE Assessment: Within Functional Limits LUE Assessment LUE Assessment: Exceptions to [facility] Active Range of Motion (AROM) Comments: shoulder flexion 60 degrees, abd 40, elbow flexion 110, limited wrist extension, finger extension 80% General Strength Comments: grasp 3+/5 LUE Body System: Neuro Brunstrum levels for arm and hand: Arm;Hand Brunstrum level for arm: Stage III Synergy is performed voluntarily Brunstrum level for hand: Stage III Synergies performed voluntarily   FAST-UL Outcome Measure  Hand-to-mouth (HtM) Movement Starting Position: Participant seated on a standard chair without armrests. Trunk leaning on back support of chair. Both hands placed in pronated position on the ipsilateral middle thigh. Feet placed flat on the floor. If participants have any difficulty in understanding instructions (i.e. aphasia) a visual demonstration is suggested. For each of the 5 tasks of the FAST-UL, the subject at first performs the movement with the less affected UL and then with the affected one. The movement can be repeated 3 times and the best score of the three attempts is assigned.   Instructions: Each subject is asked to move the hand towards the mouth, touch it with fingertips and return to the thigh. Motor task occurs without moving the trunk off the back support and without  moving the head toward the hand.   Scoring: Clinical score from 0 to 3 is provided by comparing affected side with less affected one as follows: 0 = no movement at all. 1 = The movement task is not completed (less of 50% of the contralateral HtM movement). 2 = The movement task is not completed (more of 50% of the contralateral HtM movement but the mouth is not reached) or the movement task is completed with compensations. If the mouth is touched with the wrist or the palm or the movement is performed with head or trunk compensations (flexion of the head and trunk towards the hand) the score is 2.   3 = movement carried out at 100% of the contralateral HtM movement. HtM occurs with adequate shoulder flexion and abduction, elbow flexion, and forearm supination. The mouth is touched with fingertips.  Patient Score: 2   Reach to Target (RtT) Movement Starting Position: Same starting conditions of HtM movement. Instructions: Each subject is asked to move the hand toward a target (i.e. the hand of the examiner)  located in front of the subject in the ipsilateral workspace at shoulder height, at a distance corresponding to 100% of the fully extended UL within arm's reach (less affected arm as reference). Participants have to reach, touch the target, and return. Motor task occurs without moving the trunk off the back support. Scoring: Clinical score from 0 to 3 is provided by comparing affected side with less affected one as follows: 0 = no movement at all. 1 = The movement task is not completed (less of 50% of the contralateral RtT movement).  2 = The movement task is not completed (more of 50% of the contralateral RtT movement but the target is not reached) or the movement task is completed with compensations (i.e. the trunk loses contact with the back support of the chair with forward displacement, shoulder flexion occurs with excessive scapular elevation, or shoulder excessive abduction). If the  target is reached with trunk or shoulder compensations for inadequate elbow and finger extension the score is 2.  3 = movement performed at 100% of the contralateral RtT. The target is reached with adequate shoulder flexion, elbow, wrist and finger extension.  Patient Score: 2   Prono-supination (PS) Movement Starting Position: Same starting conditions of HtM movement. Instructions: Motor task occurs without moving the trunk anteriorly or laterally, the medial side of the humerus is against the body, the forearm is fully pronated with the hand resting on the thigh. Scoring: Clinical score from 0 to 3 is provided by comparing paretic side with less affected one as follows: 0 = no movement at all. 1 = The movement task is not completed (less of 50% of the contralateral PS movement).  2 = The movement task is not completed (more of 50% of the contralateral PS movement but the forearm is not fully supinated) or the movement task is completed with compensations (i.e. excessive trunk inclination, shoulder abduction). If the movement is completed with compensations at elbow, shoulder or trunk level the score is 2. 3 = movement performed at 100% of the contralateral PS (complete supination of the forearm with the dorsal part of the hand in contact with the thigh).   Patient Score: 1   Grasp and Release (GaR) Movement Starting position: Participant seated on a standard chair. Hip and knees in 90 flexion, feet flat on the floor. Upper limb (UL) resting on a table in front of the participant with approximately 90 elbow flexion, forearm pronated and fingers in a relaxed extended and adducted position.  Instructions: The subject performs a grasping movement of a cylindrical rigid glass (at least 6 cm diameter) placed proximally to an imaginary line connecting the distal joints of thumb and index finger. The subject is asked to grasp the glass, lift it at least 2 cm (elbow remains in contact with the  table), and release it. Scoring:  Clinical score from 0 to 3 is provided by comparing affected side with less affected one as follows: 0 = No movement. The grasp is not possible. 1 = The movement task is not completed (less of 50% of the task). Some prehension is possible but the grasp is not sufficiently stable to lift the object; the grasp can be performed with the use of the less affected hand only to stabilize the glass for inadequate hand/finger opening and the release is not possible. Some hand opening is required otherwise the score is 0. 2 = The movement task is not completed (more of 50% of the task). The object is grasped and  lifted but it falls or the task is completed using alternative grasping strategies (i.e. multi-pulpar, palmar, digito palmar; grasping and releasing of the object is possible with abnormal orientation of the wrist and fingers toward the object and the forearm is lifted off the table). 3 = The task is completed using the expected pattern (normal orientation of fingers or wrist toward the object, the grasp occurs with thumb and fingers in opposition, forearm supination, elbow flexion; thumb abduction and finger extension to release the object).  Patient Score: 1   Pinch and Release (PaR) Movement Starting position: Same starting conditions of GaR movement The participant performs a PaR movement of a pen placed on a table in the midline of an imaginary line connecting the distal joints of thumb and index finger. The participants asked to pinch the pen with the tips of thumb and index finger, lift it at least 2 cm (elbow remains in contact with the table), and release it. Clinical score from 0 to 3 is provided by comparing affected side with non-affected one as follows: 0 = No movement. The pinch is not possible. 1 = The movement task is not completed (less of 50% of the task). Some prehension is possible but the pinch is not sufficiently stable to lift the object; the  pinch occurs with the use of the less affected hand to stabilize the object for inadequate finger opening and the release is not possible. Some fingers movement is required otherwise the score is 0. 2 = The movement task is not completed (more of 50% of the task). The object is pinched and lifted but it falls or the task is completed using alternative pinching strategies (e.g. pinching with all the fingers, tripod pinch, pinching and releasing of the object is possible with abnormal orientation of fingers and wrist toward the object and the forearm is lifted off the table). 3 = The task is completed using the expected pattern (normal orientation of fingers or wrist toward the object, the pinch occurs with opposition of pads of index finger and thumb, and wrist extension).  Patient Score: 1  Total score: 7/15   Care Tool Care Tool Self Care Eating   Eating Assist Level: Minimal Assistance - Patient > 75%    Oral Care    Oral Care Assist Level: Minimal Assistance - Patient > 75%    Bathing   Body parts bathed by patient: Chest;Abdomen;Buttocks;Front perineal area;Right upper leg;Left upper leg;Face;Left arm Body parts bathed by helper: Right arm;Right lower leg;Left lower leg   Assist Level: Moderate Assistance - Patient 50 - 74%    Upper Body Dressing(including orthotics)   What is the patient wearing?: Pull over shirt   Assist Level: Moderate Assistance - Patient 50 - 74%    Lower Body Dressing (excluding footwear)   What is the patient wearing?: Incontinence brief;Pants Assist for lower body dressing: Moderate Assistance - Patient 50 - 74%    Putting on/Taking off footwear   What is the patient wearing?: Non-skid slipper socks Assist for footwear: Maximal Assistance - Patient 25 - 49%       Care Tool Toileting Toileting activity   Assist for toileting: Maximal Assistance - Patient 25 - 49%       Refer to Care Plan for Long Term Goals  SHORT TERM GOAL WEEK 1 OT Short  Term Goal 1 (Week 1): pt will demonstrate improved left side awareness to don L arm and L leg in clothing first to follow through with hemi  dressing strategies OT Short Term Goal 2 (Week 1): Pt will don shirt with min A to demonstrate improved memory of dressing techniques. OT Short Term Goal 3 (Week 1): Pt will use L hand to hold wash cloth and wash R arm with 50 % A. OT Short Term Goal 4 (Week 1): pt will ambulated to toilet with CGA with LRAD.  Recommendations for other services: None    Skilled Therapeutic Intervention ADL ADL Eating: Minimal assistance Grooming: Minimal assistance Upper Body Bathing: Minimal assistance Where Assessed-Upper Body Bathing: Shower Lower Body Bathing: Minimal assistance Where Assessed-Lower Body Bathing: Shower Upper Body Dressing: Moderate assistance Where Assessed-Upper Body Dressing: Edge of bed Lower Body Dressing: Maximal assistance Where Assessed-Lower Body Dressing: Edge of bed Toileting: Maximal assistance Where Assessed-Toileting: Teacher, Adult Education: Curator Method: Surveyor, Minerals: Acupuncturist: Insurance Underwriter Method: Designer, Industrial/product: Grab bars;Transfer tub bench   Pt seen for initial evaluation and ADL training with a focus on LUE functional use.  Pt very motivated and eager to participate.  Pt engaged in LUE AAROM exercises while sitting at EOB, ambulation with RW to toilet and then to shower.  Bathing and then dressing from EOB.  Pt is limited by only partial use of LUE but he puts in excellent effort trying to use the extremity.  Reviewed role of OT, discussed POC and pt's goals, and ELOS. Pt resting in bed With all needs met and bed alarm set.      Discharge Criteria: Patient will be discharged from OT if patient refuses treatment 3 consecutive times without medical reason, if treatment goals not met, if there is a  change in medical status, if patient makes no progress towards goals or if patient is discharged from hospital.  The above assessment, treatment plan, treatment alternatives and goals were discussed and mutually agreed upon: by patient  [facility] 12/12/2023, 5:17 PM

## 2023-12-12 NOTE — Evaluation (Signed)
 Physical Therapy Assessment and Plan  Patient Details  Name: Ian Wong MRN: [id number] Date of Birth: 16-Dec-1960  PT Diagnosis: Abnormality of gait, Difficulty walking, Hemiparesis non-dominant, Impaired cognition, Impaired sensation, and Muscle weakness Rehab Potential: Good ELOS: 12-14 days   Today's Date: 12/12/2023 PT Individual Time: 9141-9041 PT Individual Time Calculation (min): 60 min    Hospital Problem: Principal Problem:   Right middle cerebral artery stroke [facility])   Past Medical History:  Past Medical History:  Diagnosis Date   GERD (gastroesophageal reflux disease)    Hypothyroidism    Seizures (HCC)    Past Surgical History:  Past Surgical History:  Procedure Laterality Date   AORTA - BILATERAL FEMORAL ARTERY BYPASS GRAFT Bilateral 01/07/2017   Procedure: AORTOBIFEMORAL BYPASS GRAFT;  Surgeon: Krystal JULIANNA Doing, MD;  Location: [facility] OR;  Service: Vascular;  Laterality: Bilateral;   I & D EXTREMITY Left 11/28/2014   Procedure: IRRIGATION AND DEBRIDEMENT;  REPAIR OF CRUSH INJURY;  Surgeon: Franky Curia, MD;  Location: [facility] OR;  Service: Orthopedics;  Laterality: Left;   INCISIONAL HERNIA REPAIR N/A 10/26/2017   Procedure: HERNIA REPAIR INCISIONAL WITH MESH;  Surgeon: Mavis Anes, MD;  Location: [facility] ORS;  Service: General;  Laterality: N/A;   IR CT HEAD LTD  11/30/2023   IR INTRAVSC STENT CERV CAROTID W/EMB-PROT MOD SED INCL ANGIO  11/30/2023   IR PERCUTANEOUS ART THROMBECTOMY/INFUSION INTRACRANIAL INC DIAG ANGIO  11/30/2023   IR US  GUIDE VASC ACCESS RIGHT  11/30/2023   RADIOLOGY WITH ANESTHESIA N/A 11/30/2023   Procedure: IR WITH ANESTHESIA;  Surgeon: Radiologist, Medication, MD;  Location: [facility] OR;  Service: Radiology;  Laterality: N/A;    Assessment & Plan Clinical Impression: Patient is a 63 year old right-handed male with history significant for GERD, hypothyroidism, remote seizure, PAD with bilateral femoral artery bypass 2018, quit smoking 7 years ago. Per chart  review patient lives with spouse. Mobile home with ramped entrance. Independent prior to admission. They do have a son that is disabled. Presented to [facility] 11/30/2023 with right gaze deviation and left hemiplegia/slurred speech of acute onset. Cranial CT scan showed hyperdense right M1 MCA concerning for thrombus. 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 Patient did receive TNK at [facility]. CT angiogram of the head and neck occluded right ICA at its origin with nonopacification throughout the neck. Severe right vertebral artery origin stenosis. Severe stenosis of the intradural small/nondominant left vertebral artery. Approximately 70% stenosis of the left ICA origin. Admission chemistries unremarkable except WBC 14,400, hemoglobin A1c 5.9. Patient was transferred to [facility] and underwent cerebral angiogram mechanical thrombectomy as well as right carotid stenting and angioplasty 11/30/2023 per Dr.de Macedo Rodriguez. Follow-up MRI showed fairly extensive acute right middle cerebral artery territory infarcts. Few nonspecific punctate chronic microhemorrhages scattered elsewhere within the supratentorial brain. Chronic lacunar infarct within the left thalamus. Echocardiogram ejection fraction of 50 to 55% no wall motion abnormalities. Follow-up placed on aspirin  81 mg daily as well as Brilinta  90 mg twice daily for CVA prophylaxis. Lovenox  for DVT prophylaxis. Hospital course dysphagia currently on dysphagia #2 nectar thick liquid diet with nasogastric tube feeds for nutritional support. There was question of COPD exacerbation respiratory distress some exertional wheezing placed on prednisone  with taper completing a 5-day course of Unasyn . He was being weaned from nasal cannula oxygen with latest chest x-ray showing mild left basilar atelectasis. Patient with mild hyponatremia 133 and monitored. Therapy evaluations completed due to patient decreased functional mobility  and left-sided  weakness was admitted for a comprehensive rehab program.   Patient currently requires min with mobility secondary to muscle weakness, decreased cardiorespiratoy endurance, impaired timing and sequencing and decreased coordination, decreased midline orientation and decreased attention to left, decreased attention, decreased awareness, decreased problem solving, and decreased memory, and decreased sitting balance, decreased standing balance, decreased postural control, hemiplegia, and decreased balance strategies.  Prior to hospitalization, patient was independent  with mobility and lived with Spouse, Son in a Mobile home home.  Home access is  Ramped entrance.  Patient will benefit from skilled PT intervention to maximize safe functional mobility, minimize fall risk, and decrease caregiver burden for planned discharge home with 24 hour supervision.  Anticipate patient will benefit from follow up HH at discharge.  PT - End of Session Activity Tolerance: Tolerates 30+ min activity without fatigue Endurance Deficit: Yes Endurance Deficit Description: requires rest breaks with mobility tasks PT Assessment Rehab Potential (ACUTE/IP ONLY): Good PT Barriers to Discharge: Decreased caregiver support;Home environment access/layout PT Barriers to Discharge Comments: lives with wife and son, wife cares for son with disability PT Patient demonstrates impairments in the following area(s): Balance;Endurance;Motor;Perception;Safety;Sensory PT Transfers Functional Problem(s): Bed Mobility;Bed to Chair;Car PT Locomotion Functional Problem(s): Ambulation;Stairs PT Plan PT Intensity: Minimum of 1-2 x/day ,45 to 90 minutes PT Frequency: 5 out of 7 days PT Duration Estimated Length of Stay: 12-14 days PT Treatment/Interventions: Ambulation/gait training;Discharge planning;Functional mobility training;Psychosocial support;Therapeutic Activities;Visual/perceptual  remediation/compensation;Balance/vestibular training;Disease management/prevention;Neuromuscular re-education;Therapeutic Exercise;Cognitive remediation/compensation;DME/adaptive equipment instruction;Splinting/orthotics;Pain management;UE/LE Strength taining/ROM;Community reintegration;Patient/family education;Stair training;UE/LE Coordination activities;Functional electrical stimulation PT Transfers Anticipated Outcome(s): supervision PT Locomotion Anticipated Outcome(s): supervision ambulatory PT Recommendation Recommendations for Other Services: None Follow Up Recommendations: Home health PT Patient destination: Home Equipment Recommended: To be determined   PT Evaluation Precautions/Restrictions Precautions Precautions: Fall Precaution Comments: L inattention, coretrak Restrictions Weight Bearing Restrictions Per Provider Order: No Pain Interference Pain Interference Pain Effect on Sleep: 2. Occasionally Pain Interference with Therapy Activities: 1. Rarely or not at all Pain Interference with Day-to-Day Activities: 1. Rarely or not at all Home Living/Prior Functioning Home Living Available Help at Discharge: Available 24 hours/day;Family Type of Home: Mobile home Home Access: Ramped entrance Home Layout: One level Bathroom Shower/Tub: [employer]: Standard Bathroom Accessibility: Yes Additional Comments: wife is home with son with physical disability during the day  Lives With: Spouse;Son Prior Function Level of Independence: Independent with basic ADLs;Independent with gait;Independent with transfers;Independent with homemaking with ambulation  Able to Take Stairs?: Reciprically Driving: Yes Vocation: Full time employment 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 Status: Impaired/Different from baseline Arousal/Alertness: Awake/alert Orientation Level: Oriented X4 Year: 2025 Month: January Day of Week: Incorrect  (Friday) Attention: Sustained Sustained Attention: Impaired Sustained Attention Impairment: Verbal basic;Functional basic Memory: Impaired Memory Impairment: Storage deficit Awareness: Impaired Awareness Impairment: Intellectual impairment Problem Solving: Impaired Problem Solving Impairment: Verbal complex;Functional complex Sensation Sensation Light Touch: Impaired by gross assessment (reports decreased sensation to L touch L arm/leg) Hot/Cold: Impaired by gross assessment Proprioception: Impaired by gross assessment (L great toe 3/5) Coordination Gross Motor Movements are Fluid and Coordinated: No Fine Motor Movements are Fluid and Coordinated: No Coordination and Movement Description: L hemiparesis, decreased smoothness and accuracy Motor  Motor Motor: Hemiplegia Motor - Skilled Clinical Observations: L hemiparesis LUE/LLE  Trunk/Postural Assessment  Cervical Assessment Cervical Assessment: Exceptions to [facility] (R cervical rotation 2/2 L inattention, able to correct actively to neutral) Thoracic Assessment Thoracic Assessment: Within Functional Limits Lumbar Assessment Lumbar Assessment: Within Functional Limits Postural Control  Postural Control: Deficits on evaluation Righting Reactions: delayed and inadequate Protective Responses: decreased Postural Limitations: decreased  Balance Balance Balance Assessed: Yes Static Sitting Balance Static Sitting - Balance Support: Right upper extremity supported Static Sitting - Level of Assistance: 5: Stand by assistance Dynamic Sitting Balance Dynamic Sitting - Level of Assistance: 5: Stand by assistance (CGA) Static Standing Balance Static Standing - Balance Support: No upper extremity supported;During functional activity Static Standing - Level of Assistance: 4: Min assist (CGA/minA) Dynamic Standing Balance Dynamic Standing - Balance Support: During functional activity;No upper extremity supported Dynamic Standing - Level of  Assistance: 4: Min assist Extremity Assessment  RLE Assessment RLE Assessment: Within Functional Limits General Strength Comments: Grossly 5/5 LLE Assessment LLE Assessment: Exceptions to [facility] General Strength Comments: tested in supine LLE Strength Left Hip Flexion: 3+/5 Left Knee Flexion: 4/5 Left Knee Extension: 3+/5 Left Ankle Dorsiflexion: 3+/5 Left Ankle Plantar Flexion: 3/5  Care Tool Care Tool Bed Mobility Roll left and right activity   Roll left and right assist level: Minimal Assistance - Patient > 75%    Sit to lying activity   Sit to lying assist level: Minimal Assistance - Patient > 75%    Lying to sitting on side of bed activity   Lying to sitting on side of bed assist level: the ability to move from lying on the back to sitting on the side of the bed with no back support.: Minimal Assistance - Patient > 75%     Care Tool Transfers Sit to stand transfer   Sit to stand assist level: Minimal Assistance - Patient > 75%    Chair/bed transfer   Chair/bed transfer assist level: Minimal Assistance - Patient > 75%    Car transfer   Car transfer assist level: Minimal Assistance - Patient > 75% (simulated with transfer OOB)      Care Tool Locomotion Ambulation   Assist level: Minimal Assistance - Patient > 75% Assistive device: No Device Max distance: 150'  Walk 10 feet activity   Assist level: Minimal Assistance - Patient > 75% Assistive device: No Device   Walk 50 feet with 2 turns activity   Assist level: Minimal Assistance - Patient > 75% Assistive device: No Device  Walk 150 feet activity   Assist level: Minimal Assistance - Patient > 75% Assistive device: No Device  Walk 10 feet on uneven surfaces activity Walk 10 feet on uneven surfaces activity did not occur: Safety/medical concerns      Stairs   Assist level: Minimal Assistance - Patient > 75% Stairs assistive device: 1 hand rail Max number of stairs: 4  Walk up/down 1 step activity     Walk  up/down 1 step or curb assistive device: 1 hand rail  Walk up/down 4 steps activity   Walk up/down 4 steps assist level: Minimal Assistance - Patient > 75% Walk up/down 4 steps assistive device: 1 hand rail  Walk up/down 12 steps activity Walk up/down 12 steps activity did not occur: Safety/medical concerns      Pick up small objects from floor Pick up small object from the floor (from standing position) activity did not occur: Safety/medical concerns      Wheelchair Is the patient using a wheelchair?: Yes Type of Wheelchair: Manual   Wheelchair assist level: Dependent - Patient 0%    Wheel 50 feet with 2 turns activity   Assist Level: Dependent - Patient 0%  Wheel 150 feet activity   Assist Level: Dependent - Patient 0%  Refer to Care Plan for Long Term Goals  SHORT TERM GOAL WEEK 1 PT Short Term Goal 1 (Week 1): Pt will complete bed mobility with supervision PT Short Term Goal 2 (Week 1): Pt will complete transfers CGA with LRAD PT Short Term Goal 3 (Week 1): Pt will ambulate >150' with CGA and LRAD PT Short Term Goal 4 (Week 1): Pt will complete gait up/down ramp with CGA  Recommendations for other services: None   Skilled Therapeutic Intervention Evaluation completed (see details above and below) with education on PT POC and goals and individual treatment initiated with focus on therapeutic activities for transfer training dynamic standing balance and participation with self care tasks as well as gait training. Pt denies pain throughout session, reports feeling some pain due to discomfort in bed however improves with sitting upright and mobility. Pt completes bed mobility with min assist using hospital bed features, requires assist for immediate sitting balance with decreased attention to L side with R gaze preference but able to gain and maintain midline with increased time upright. Pt completes transfers from bed to [facility] and to [facility] with min assist no device. Pt requests to use  BSC however unable to void, requires mod assist for donning brief and pants started in sitting and stands to manage over hips. Pt transported to main gym via WC dependently. Pt ambulates 20' and 150' without device with min assist demonstrating inconsistent cadence and decreased stride length, decreased attention to L side however improves with cues. Pt completes up/down 4 steps with min assist and RHR, reciprocal gait ascending, step to gait descending with LLE leading. Pt returns to room and remains seated in [facility] with all needs within reach, cal light in place and chair alarm donned and activated at end of session.  Mobility Bed Mobility Bed Mobility: Supine to Sit Supine to Sit: Minimal Assistance - Patient > 75% (for immediate sitting balance) Transfers Transfers: Stand to Sit;Sit to Stand;Stand Pivot Transfers Sit to Stand: Minimal Assistance - Patient > 75% Stand to Sit: Minimal Assistance - Patient > 75% Stand Pivot Transfers: Minimal Assistance - Patient > 75% Stand Pivot Transfer Details: Tactile cues for posture;Tactile cues for sequencing;Verbal cues for gait pattern;Verbal cues for technique;Verbal cues for precautions/safety;Verbal cues for sequencing Transfer (Assistive device): None Locomotion  Gait Ambulation: Yes Gait Assistance: Minimal Assistance - Patient > 75% Gait Distance (Feet): 150 Feet Assistive device: None Gait Assistance Details: Verbal cues for technique;Verbal cues for sequencing;Verbal cues for safe use of DME/AE;Tactile cues for posture;Verbal cues for precautions/safety;Manual facilitation for weight shifting Gait Gait: Yes Gait Pattern: Impaired Gait Pattern: Step-through pattern;Decreased stride length;Wide base of support Gait velocity: reduced Stairs / Additional Locomotion Stairs: Yes Stairs Assistance: Minimal Assistance - Patient > 75% Stair Management Technique: One rail Left Number of Stairs: 4 Height of Stairs: 6 Wheelchair  Mobility Wheelchair Mobility: No   Discharge Criteria: Patient will be discharged from PT if patient refuses treatment 3 consecutive times without medical reason, if treatment goals not met, if there is a change in medical status, if patient makes no progress towards goals or if patient is discharged from hospital.  The above assessment, treatment plan, treatment alternatives and goals were discussed and mutually agreed upon: by patient  Reche Ohara PT, DPT 12/12/2023, 12:46 PM

## 2023-12-12 NOTE — Evaluation (Signed)
 Speech Language Pathology Assessment and Plan  Patient Details  Name: Ian Wong MRN: [id number] Date of Birth: 1961/11/17  SLP Diagnosis: Cognitive Impairments;Dysphagia;Dysarthria  Rehab Potential: Excellent ELOS: 18-21 days    Today's Date: 12/12/2023 SLP Individual Time: 8984-8884 SLP Individual Time Calculation (min): 60 min   Hospital Problem: Principal Problem:   Right middle cerebral artery stroke [facility])  Past Medical History:  Past Medical History:  Diagnosis Date   GERD (gastroesophageal reflux disease)    Hypothyroidism    Seizures (HCC)    Past Surgical History:  Past Surgical History:  Procedure Laterality Date   AORTA - BILATERAL FEMORAL ARTERY BYPASS GRAFT Bilateral 01/07/2017   Procedure: AORTOBIFEMORAL BYPASS GRAFT;  Surgeon: Krystal JULIANNA Doing, MD;  Location: [facility] OR;  Service: Vascular;  Laterality: Bilateral;   I & D EXTREMITY Left 11/28/2014   Procedure: IRRIGATION AND DEBRIDEMENT;  REPAIR OF CRUSH INJURY;  Surgeon: Franky Curia, MD;  Location: [facility] OR;  Service: Orthopedics;  Laterality: Left;   INCISIONAL HERNIA REPAIR N/A 10/26/2017   Procedure: HERNIA REPAIR INCISIONAL WITH MESH;  Surgeon: Mavis Anes, MD;  Location: [facility] ORS;  Service: General;  Laterality: N/A;   IR CT HEAD LTD  11/30/2023   IR INTRAVSC STENT CERV CAROTID W/EMB-PROT MOD SED INCL ANGIO  11/30/2023   IR PERCUTANEOUS ART THROMBECTOMY/INFUSION INTRACRANIAL INC DIAG ANGIO  11/30/2023   IR US  GUIDE VASC ACCESS RIGHT  11/30/2023   RADIOLOGY WITH ANESTHESIA N/A 11/30/2023   Procedure: IR WITH ANESTHESIA;  Surgeon: Radiologist, Medication, MD;  Location: [facility] OR;  Service: Radiology;  Laterality: N/A;    Assessment / Plan / Recommendation Clinical Impression HPI: A 63 y.o. male with past medical history significant for seizures, hypothyroidism, GERD who presented to [facility] 11/30/23 with persistent right gaze deviation and left hemiplegia.  CT head negative, CTA showed occlusion of  the right ICA with occlusion of the right MCA.  Patient was given TNK at [facility] and was transferred to [facility] 11/30/23 for emergent thrombectomy.  On neurologic exam on arrival to [facility] patient still had persistent left-sided weakness, right gaze, left-sided sensory deficit, left hemianopsia, left sensory neglect.  He was then taken emergently to IR for Mechanical thrombectomy, right carotid stent and angioplasty  --> TICI 3. He was Left intubated postprocedure.Post procedure CT showed no hemorrhage. On 12/01/23 MRI showed extensive acute right MCA territory infarcts with chronic microhemorrhages versus acute petechial hemorrhages.   Clinical Impression:  Bedside Swallow Evaluation: A bedside swallow evaluation was completed to assess for s/sx of oropharyngeal dysphagia. Oral mechanism exam revealed facial asymmetry and reduced sensation on L. POs administered including NTL, purees and D2 solids. Patient with no s/sx of aspiration throughout all consistencies. Patient with significant buccal pocketing of D2 textures on L characterized by almost entire bolus. Patient required cues to clear and eventually expectorated majority of bolus. Significantly less residuals observed during D1 textures, therefore recommend D1/NTL at this time. Medications should be crushed in puree. Full supervision during meals and encourage patient to complete lingual sweep/liquid wash after solids. Patient should sit upright during PO, take small bites/sips at a slow rate.  Communication: Expressive and receptive language WFL Cognition: The Cognistat was utilized to assess patients cognitive-linguistic functioning. Patient scored WFL on all subtests with the exception of severe deficits in construction, moderate deficits in memory/similarities, and mild deficits in calculations. Patient unable to recall cognitive changes, however aware of L sided weakness, indicating deficits in intellectual awareness. Patient with  significant difficulty during construction task likely due to L inattention and problem solving deficits. Recommend targeting L attention, intellectual awareness, memory, and problem solving during inpatient stay.  Dysarthria: Patient approximately 80% intelligible at the conversational level due to imprecise articulation.  Pt would benefit from skilled ST services to maximize cognition, speech intelligibility and dysphagia in order to maximize functional independence at d/c. Anticipate patient will require 24 hour supervision at d/c and f/u SLP services.    Skilled Therapeutic Interventions          Patient evaluated using a standardized cognitive linguistic assessment and bedside swallow evaluation to assess current cognitive, communicative and swallowing function. See above for details.    SLP Assessment  Patient will need skilled Speech Lanaguage Pathology Services during CIR admission    Recommendations  SLP Diet Recommendations: Dysphagia 1 (Puree);Nectar Liquid Administration via: Cup;Straw Medication Administration: Crushed with puree Supervision: Staff to assist with self feeding;Full supervision/cueing for compensatory strategies Compensations: Minimize environmental distractions;Slow rate;Small sips/bites;Lingual sweep for clearance of pocketing Postural Changes and/or Swallow Maneuvers: Seated upright 90 degrees;Upright 30-60 min after meal Oral Care Recommendations: Oral care BID Patient destination: Home Follow up Recommendations: Home Health SLP;Outpatient SLP Equipment Recommended: None recommended by SLP    SLP Frequency 3 to 5 out of 7 days   SLP Duration  SLP Intensity  SLP Treatment/Interventions 18-21 days  Minumum of 1-2 x/day, 30 to 90 minutes  Cognitive remediation/compensation;Cueing hierarchy;Dysphagia/aspiration precaution training;Functional tasks;Internal/external aids;Patient/family education;Therapeutic Activities    Pain Pain in back, SLP re-positioned    SLP Evaluation Cognition Overall Cognitive Status: Impaired/Different from baseline Arousal/Alertness: Awake/alert Orientation Level: Oriented X4 Year: 2025 Month: January Day of Week: Incorrect (Friday) Attention: Sustained Sustained Attention: Impaired Sustained Attention Impairment: Verbal basic;Functional basic Memory: Impaired Memory Impairment: Storage deficit Awareness: Impaired Awareness Impairment: Intellectual impairment Problem Solving: Impaired Problem Solving Impairment: Verbal complex;Functional complex  Comprehension Auditory Comprehension Overall Auditory Comprehension: Appears within functional limits for tasks assessed Expression Expression Primary Mode of Expression: Verbal Verbal Expression Overall Verbal Expression: Appears within functional limits for tasks assessed Oral Motor Oral Motor/Sensory Function Overall Oral Motor/Sensory Function: Mild impairment Facial ROM: Reduced left;Suspected CN VII (facial) dysfunction Facial Symmetry: Abnormal symmetry left;Suspected CN VII (facial) dysfunction Facial Strength: Reduced left;Suspected CN VII (facial) dysfunction Facial Sensation: Reduced left Lingual ROM: Within Functional Limits Lingual Symmetry: Within Functional Limits Lingual Strength: Within Functional Limits Velum: Within Functional Limits Mandible: Within Functional Limits Motor Speech Overall Motor Speech: Impaired Respiration: Within functional limits Level of Impairment: Conversation Phonation: Low vocal intensity Resonance: Within functional limits Articulation: Impaired Level of Impairment: Conversation Intelligibility: Intelligibility reduced Conversation: 75-100% accurate  Care Tool Care Tool Cognition Ability to hear (with hearing aid or hearing appliances if normally used Ability to hear (with hearing aid or hearing appliances if normally used): 0. Adequate - no difficulty in normal conservation, social interaction, listening  to TV   Expression of Ideas and Wants Expression of Ideas and Wants: 4. Without difficulty (complex and basic) - expresses complex messages without difficulty and with speech that is clear and easy to understand   Understanding Verbal and Non-Verbal Content Understanding Verbal and Non-Verbal Content: 4. Understands (complex and basic) - clear comprehension without cues or repetitions  Memory/Recall Ability Memory/Recall Ability : Current season;That he or she is in a hospital/hospital unit   Bedside Swallowing Assessment General Diet Prior to this Study: Dysphagia 2 (finely chopped);Mildly thick liquids (Level 2, nectar thick) Respiratory Status: Room air Behavior/Cognition: Alert;Cooperative;Pleasant mood;Requires cueing Oral Cavity -  Dentition: Missing dentition Self-Feeding Abilities: Needs assist Patient Positioning: Upright in chair/Tumbleform Baseline Vocal Quality: Low vocal intensity Volitional Cough: Strong Volitional Swallow: Able to elicit  Ice Chips Ice chips: Not tested Thin Liquid Thin Liquid: Not tested Nectar Thick Nectar Thick Liquid: Within functional limits Presentation: Straw;Self Fed;Cup Honey Thick Honey Thick Liquid: Not tested Puree Puree: Within functional limits Presentation: Spoon Solid Solid: Impaired Presentation: Spoon;Self Fed Oral Phase Functional Implications: Left lateral sulci pocketing;Prolonged oral transit;Impaired mastication;Oral residue BSE Assessment Risk for Aspiration Impact on safety and function: Mild aspiration risk;Moderate aspiration risk Other Related Risk Factors: Lethargy;Cognitive impairment;Deconditioning;History of GERD  Short Term Goals: Week 1: SLP Short Term Goal 1 (Week 1): Patient will utilize swallowing compenstory strategies during consumption of least restrictive diet given min multimodal A SLP Short Term Goal 2 (Week 1): Patient will increase speech intelligibility to 100% during conversation with use of  strategies given min multimodal A SLP Short Term Goal 3 (Week 1): Patient will demonstrate problem solving in mildly complex situations given mod multimodal A SLP Short Term Goal 4 (Week 1): Patient will recall and utilize memory compensatory strategies given mod multimodal A SLP Short Term Goal 5 (Week 1): Patient will demonstrate awareness of cognitive and physical changes since admission given mod multimodal A  Refer to Care Plan for Long Term Goals  Recommendations for other services: None   Discharge Criteria: Patient will be discharged from SLP if patient refuses treatment 3 consecutive times without medical reason, if treatment goals not met, if there is a change in medical status, if patient makes no progress towards goals or if patient is discharged from hospital.  The above assessment, treatment plan, treatment alternatives and goals were discussed and mutually agreed upon: by patient  Verble Styron M.A., CF-SLP 12/12/2023, 12:12 PM

## 2023-12-12 NOTE — Plan of Care (Signed)
  Problem: RH Swallowing Goal: LTG Patient will consume least restrictive diet using compensatory strategies with assistance (SLP) Description: LTG:  Patient will consume least restrictive diet using compensatory strategies with assistance (SLP) Flowsheets (Taken 12/12/2023 1208) LTG: Pt Patient will consume least restrictive diet using compensatory strategies with assistance of (SLP): Modified Independent   Problem: RH Expression Communication Goal: LTG Patient will increase speech intelligibility (SLP) Description: LTG: Patient will increase speech intelligibility at word/phrase/conversation level with cues, % of the time (SLP) Flowsheets (Taken 12/12/2023 1208) LTG: Patient will increase speech intelligibility (SLP): Modified Independent Level: Conversation level Percent of time patient will use intelligible speech: 100   Problem: RH Problem Solving Goal: LTG Patient will demonstrate problem solving for (SLP) Description: LTG:  Patient will demonstrate problem solving for basic/complex daily situations with cues  (SLP) Flowsheets (Taken 12/12/2023 1208) LTG: Patient will demonstrate problem solving for (SLP): (mildly complex) Other (comment) LTG Patient will demonstrate problem solving for: Supervision   Problem: RH Memory Goal: LTG Patient will use memory compensatory aids to (SLP) Description: LTG:  Patient will use memory compensatory aids to recall biographical/new, daily complex information with cues (SLP) Flowsheets (Taken 12/12/2023 1208) LTG: Patient will use memory compensatory aids to (SLP): Supervision   Problem: RH Attention Goal: LTG Patient will demonstrate this level of attention during functional activites (SLP) Description: LTG:  Patient will will demonstrate this level of attention during functional activites (SLP) Flowsheets (Taken 12/12/2023 1208) Patient will demonstrate during cognitive/linguistic activities the attention type of: Sustained LTG: Patient will  demonstrate this level of attention during cognitive/linguistic activities with assistance of (SLP): Supervision   Problem: RH Awareness Goal: LTG: Patient will demonstrate awareness during functional activites type of (SLP) Description: LTG: Patient will demonstrate awareness during functional activites type of (SLP) Flowsheets (Taken 12/12/2023 1208) Patient will demonstrate during cognitive/linguistic activities awareness type of: Intellectual LTG: Patient will demonstrate awareness during cognitive/linguistic activities with assistance of (SLP): Supervision

## 2023-12-13 DIAGNOSIS — R1310 Dysphagia, unspecified: Secondary | ICD-10-CM

## 2023-12-13 DIAGNOSIS — I63511 Cerebral infarction due to unspecified occlusion or stenosis of right middle cerebral artery: Secondary | ICD-10-CM | POA: Diagnosis not present

## 2023-12-13 DIAGNOSIS — R739 Hyperglycemia, unspecified: Secondary | ICD-10-CM | POA: Diagnosis not present

## 2023-12-13 DIAGNOSIS — R030 Elevated blood-pressure reading, without diagnosis of hypertension: Secondary | ICD-10-CM | POA: Diagnosis not present

## 2023-12-13 LAB — GLUCOSE, CAPILLARY
Glucose-Capillary: 146 mg/dL — ABNORMAL HIGH (ref 70–99)
Glucose-Capillary: 144 mg/dL — ABNORMAL HIGH (ref 70–99)
Glucose-Capillary: 126 mg/dL — ABNORMAL HIGH (ref 70–99)
Glucose-Capillary: 195 mg/dL — ABNORMAL HIGH (ref 70–99)
Glucose-Capillary: 121 mg/dL — ABNORMAL HIGH (ref 70–99)

## 2023-12-13 MED ORDER — OSMOLITE 1.5 CAL PO LIQD
1000.0000 mL | ORAL | Status: DC
Start: 2023-12-13 — End: 2023-12-14
  Administered 2023-12-13: 1000 mL
  Filled 2023-12-13 (×3): qty 1000

## 2023-12-13 NOTE — Discharge Instructions (Addendum)
 Inpatient Rehab Discharge Instructions  Ian Wong Discharge date and time: No discharge date for patient encounter.   Activities/Precautions/ Functional Status: Activity: As tolerated Diet: Dys !! Thin liquids Wound Care: Routine skin checks Functional status:  ___ No restrictions     ___ Walk up steps independently ___ 24/7 supervision/assistance   ___ Walk up steps with assistance ___ Intermittent supervision/assistance  ___ Bathe/dress independently ___ Walk with walker     _x__ Bathe/dress with assistance ___ Walk Independently    ___ Shower independently ___ Walk with assistance    ___ Shower with assistance ___ No alcohol     ___ Return to work/school ________  Special Instructions: No driving smoking or alcohol   COMMUNITY REFERRALS UPON DISCHARGE:    Home Health:   PT      OT      ST       SNA                     Agency:[facility]     Phone:[phone] *Please expect follow-up within 2-3 business days for discharge to schedule your home visit. If you have not received follow-up, be sure to contact the site directly.*     Medical Equipment/Items Ordered:shower chair with back                                                 Agency/Supplier:[facility] [phone]  GENERAL COMMUNITY RESOURCES FOR PATIENT/FAMILY: 1) A referral for PCS (personal care services) was made on your behalf to your insurance with the [employer] Case Management Dept [phone] . To check the status of the referral, be sure to contact directly.  2) If transportation is needed for medical appointments, be sure to contact [employer] [phone], 3-5 business days in advance to ensure transportation is in place.    My questions have been answered and I understand these instructions. I will adhere to these goals and the provided educational materials after my discharge from the hospital.  Patient/Caregiver Signature _______________________________ Date  __________  Clinician Signature _______________________________________ Date __________  Please bring this form and your medication list with you to all your follow-up doctor's appointments. STROKE/TIA DISCHARGE INSTRUCTIONS SMOKING Cigarette smoking nearly doubles your risk of having a stroke & is the single most alterable risk factor  If you smoke or have smoked in the last 12 months, you are advised to quit smoking for your health. Most of the excess cardiovascular risk related to smoking disappears within a year of stopping. Ask you doctor about anti-smoking medications [place] Quit Line: 1-800-QUIT NOW Free Smoking Cessation Classes [phone]  CHOLESTEROL Know your levels; limit fat & cholesterol in your diet  Lipid Panel     Component Value Date/Time   CHOL 112 12/01/2023 0647   TRIG 146 12/01/2023 0647   HDL 26 (L) 12/01/2023 0647   CHOLHDL 4.3 12/01/2023 0647   VLDL 29 12/01/2023 0647   LDLCALC 57 12/01/2023 0647     Many patients benefit from treatment even if their cholesterol is at goal. Goal: Total Cholesterol (CHOL) less than 160 Goal:  Triglycerides (TRIG) less than 150 Goal:  HDL greater than 40 Goal:  LDL (LDLCALC) less than 100   BLOOD PRESSURE American Stroke Association blood pressure target is less that 120/80 mm/Hg  Your discharge blood pressure is:  BP: ROLLEN)  122/91 Monitor your blood pressure Limit your salt and alcohol intake Many individuals will require more than one medication for high blood pressure  DIABETES (A1c is a blood sugar average for last 3 months) Goal HGBA1c is under 7% (HBGA1c is blood sugar average for last 3 months)  Diabetes: No known diagnosis of diabetes    Lab Results  Component Value Date   HGBA1C 5.9 (H) 11/30/2023    Your HGBA1c can be lowered with medications, healthy diet, and exercise. Check your blood sugar as directed by your physician Call your physician if you experience unexplained or low blood sugars.  PHYSICAL  ACTIVITY/REHABILITATION Goal is 30 minutes at least 4 days per week  Activity: Increase activity slowly, Therapies: Physical Therapy: Home Health Return to work:  Activity decreases your risk of heart attack and stroke and makes your heart stronger.  It helps control your weight and blood pressure; helps you relax and can improve your mood. Participate in a regular exercise program. Talk with your doctor about the best form of exercise for you (dancing, walking, swimming, cycling).  DIET/WEIGHT Goal is to maintain a healthy weight  Your discharge diet is:  Diet Order             DIET - DYS 1 Room service appropriate? Yes; Fluid consistency: Nectar Thick  Diet effective now                   liquids Your height is:  Height: 5' 10 (177.8 cm) Your current weight is: Weight: 89.4 kg Your Body Mass Index (BMI) is:  BMI (Calculated): 28.28 Following the type of diet specifically designed for you will help prevent another stroke. Your goal weight range is:   Your goal Body Mass Index (BMI) is 19-24. Healthy food habits can help reduce 3 risk factors for stroke:  High cholesterol, hypertension, and excess weight.  RESOURCES Stroke/Support Group:  Call [phone]   STROKE EDUCATION PROVIDED/REVIEWED AND GIVEN TO PATIENT Stroke warning signs and symptoms How to activate emergency medical system (call 911). Medications prescribed at discharge. Need for follow-up after discharge. Personal risk factors for stroke. Pneumonia vaccine given: No Flu vaccine given: No My questions have been answered, the writing is legible, and I understand these instructions.  I will adhere to these goals & educational materials that have been provided to me after my discharge from the hospital.

## 2023-12-13 NOTE — Progress Notes (Signed)
 PROGRESS NOTE   Subjective/Complaints:  Ian Wong doing well, slept well, denies pain, LBM this morning, urinating fine, denies any other complaints or concerns today. Nursing reported that they were trying to encourage PO intake but Ian Wong does mention often that he feels full-- Ian Wong states if we decrease osmolite rate, he'd be on board with trying hard to eat his meals. Will decrease rate.   ROS: as per HPI. Denies CP, SOB, abd pain, N/V/D/C, or any other complaints at this time.    Objective:   No results found. Recent Labs    12/11/23 0705  WBC 13.1*  HGB 15.4  HCT 43.9  PLT 285   Recent Labs    12/11/23 0705  NA 133*  K 4.0  CL 102  CO2 21*  GLUCOSE 156*  BUN 28*  CREATININE 0.82  CALCIUM  8.5*         Intake/Output Summary (Last 24 hours) at 12/13/2023 1026 Last data filed at 12/13/2023 0851 Gross per 24 hour  Intake 236 ml  Output --  Net 236 ml        Physical Exam: Vital Signs Blood pressure (!) 144/78, pulse 90, temperature 98.4 F (36.9 C), resp. rate 17, height 5' 10 (1.778 m), weight 89.4 kg, SpO2 99%.  Constitutional:      General: He is not in acute distress. Sitting up at EOB.  HENT:     Head: Normocephalic.     Right Ear: External ear normal.     Left Ear: External ear normal.     Nose:     Comments: NGT Eyes:     Conjunctiva/sclera: Conjunctivae normal.     Pupils: Pupils are equal, round, and reactive to light.  Cardiovascular:     Rate and Rhythm: Normal rate and regular rhythm.     Heart sounds: No murmur heard.    No gallop.  Pulmonary:     Effort: Pulmonary effort is normal. No respiratory distress.     Breath sounds: No wheezing.  Abdominal:     General: Bowel sounds are normal. There is no distension.     Palpations: Abdomen is soft.     Tenderness: There is no abdominal tenderness.  Skin:    General: Skin is warm and dry.  Psychiatric:     Comments: Flat, slow to engage,  but pleasant and cooperative  PRIOR EXAMS: Musculoskeletal:       General: No swelling or tenderness.     Cervical back: Normal range of motion.  Neurological:     Mental Status: He is alert.     Comments: Ian Wong is alert. Abulic. Oriented to person, place, month/year and reason. Ian Wong demonstrates fair insight and awareness. Able to provide some limited biographical information. Right gaze preference. Can come to the left with cueing. Speech with moderate volume but dysarthric. Left central 7. Decreased sensation of left face. 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 MMT: LUE is 2/5 deltoids, biceps, triceps and 2+ left wrist and hand. LLE appears to be 4- to 4/5. Sensation is 1+/2 Left arm and leg. DTR's 3+ left arm and leg. No abnl resting tone.     Assessment/Plan: 1. Functional deficits  which require 3+ hours per day of interdisciplinary therapy in a comprehensive inpatient rehab setting. Physiatrist is providing close team supervision and 24 hour management of active medical problems listed below. Physiatrist and rehab team continue to assess barriers to discharge/monitor patient progress toward functional and medical goals  Care Tool:  Bathing    Body parts bathed by patient: Chest, Abdomen, Buttocks, Front perineal area, Right upper leg, Left upper leg, Face, Left arm   Body parts bathed by helper: Right arm, Right lower leg, Left lower leg     Bathing assist Assist Level: Moderate Assistance - Patient 50 - 74%     Upper Body Dressing/Undressing Upper body dressing   What is the patient wearing?: Pull over shirt    Upper body assist Assist Level: Moderate Assistance - Patient 50 - 74%    Lower Body Dressing/Undressing Lower body dressing      What is the patient wearing?: Incontinence brief, Pants     Lower body assist Assist for lower body dressing: Moderate Assistance - Patient 50 - 74%     Toileting Toileting    Toileting assist Assist for toileting:  Maximal Assistance - Patient 25 - 49%     Transfers Chair/bed transfer  Transfers assist     Chair/bed transfer assist level: Minimal Assistance - Patient > 75%     Locomotion Ambulation   Ambulation assist      Assist level: Minimal Assistance - Patient > 75% Assistive device: No Device Max distance: 150'   Walk 10 feet activity   Assist     Assist level: Minimal Assistance - Patient > 75% Assistive device: No Device   Walk 50 feet activity   Assist    Assist level: Minimal Assistance - Patient > 75% Assistive device: No Device    Walk 150 feet activity   Assist    Assist level: Minimal Assistance - Patient > 75% Assistive device: No Device    Walk 10 feet on uneven surface  activity   Assist Walk 10 feet on uneven surfaces activity did not occur: Safety/medical concerns         Wheelchair     Assist Is the patient using a wheelchair?: Yes Type of Wheelchair: Manual    Wheelchair assist level: Dependent - Patient 0%      Wheelchair 50 feet with 2 turns activity    Assist        Assist Level: Dependent - Patient 0%   Wheelchair 150 feet activity     Assist      Assist Level: Dependent - Patient 0%   Blood pressure (!) 144/78, pulse 90, temperature 98.4 F (36.9 C), resp. rate 17, height 5' 10 (1.778 m), weight 89.4 kg, SpO2 99%.  Medical Problem List and Plan: 1. 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  Antithrombotics: -DVT/anticoagulation:  Pharmaceutical: Lovenox  40mg  daily             -antiplatelet therapy: Aspirin  81 mg daily and Brilinta  90 mg twice daily 3. Pain Management: Tylenol  as needed 4. Mood/Behavior/Sleep: Provide emotional support  -may be experiencing some early  depression. Told me he regrets not taking care  of himself earlier in his life             -monitor for now. Consider SSRI             -neuropsych eval when provider is available             -antipsychotic agents: N/A 5. Neuropsych/cognition: This patient is capable of making decisions on his own behalf. 6. Skin/Wound Care: Routine skin checks 7. Fluids/Electrolytes/Nutrition: Routine in and outs with follow-up chemistries on Monday 8.  Dysphagia.  Dysphagia #2 nectar thick liquids.   -Continue nasogastric tube for nutritional support over weekend. Consider reducing rate of Osmolite to 40cc/hr to help stimulate PO intake.    -Advance diet per speech therapy -12/12/23 leave rate for now, no documentation of intake and Ian Wong just arrived; monitor; CBGs up but probably from TF/steroids? No dx of DM mentioned. Continue SSI -12/13/23 Ian Wong feels full and thus isn't eating as much (only two meals documented in flowsheet, 10% and 0% eaten); agreed that we'll decrease the rate to 58ml/hr but will need to try to eat more of his trays. CBGs ok.  CBG (last 3)  Recent Labs    12/12/23 2207 12/13/23 0611 12/13/23 0855  GLUCAP 130* 146* 144*    9.  Hyperlipidemia.  Lipitor 20mg  daily 10.  GERD.  Nexium  20mg  daily 11.  Hypothyroidism.  Synthroid  50mcg daily 12.  COPD exacerbation/respiratory distress.  Patient completed 5-day course of Unasyn .  Prednisone  taper.   -Ian Wong on room air currently. 13. HTN?: unclear if this is a new diagnosis? No mention in notes. BPs elevated overnight. Monitor for now -12/13/23 BPs still slightly elevated, if trend continues, may need to start meds- defer to weekday for now  Vitals:   12/11/23 1557 12/11/23 1915 12/12/23 0641 12/12/23 1430  BP: (!) 144/76 130/71 (!) 155/79 (!) 148/70   12/12/23 2227 12/13/23 0424  BP: 117/65 (!) 144/78    14. Bowel regimen: continue colace 100mg  daily.   -12/13/23 LBM yesterday  LOS: 2 days A FACE TO FACE EVALUATION WAS PERFORMED  [id number] 12/13/2023, 10:26 AM

## 2023-12-13 NOTE — Discharge Summary (Signed)
Physician Discharge Summary  Patient ID: Ian Wong MRN: [id number] DOB/AGE: [birth year] 63 y.o.  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 Modified Barium Swallow Study Patient Details Name: Ian Wong MRN: [id number] Date of Birth: Jan 20, 1961 Today's Date: 12/29/2023 HPI/PMH: HPI: 63 yo male transfer from [facility] with L side weakness and R gaze deviation s/p TNK and thrombectomy 12/30. There was some concern for aspiration after vomiting episode during intubation so was kept on vent until 12/31. 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 Repeate CT 1/1 due to decreased wakefulness showed increasing mass effect on the R lateral ventricle.  PMH includes: GERD, hypothyroidism, seizures. MBS completed on 12/08/23 recommending nectar thick liquids. Repeat MBS to determine readiness to advance. Clinical Impression: Patient presents with mild oral dysphagia with a functional pharyngeal phase. Oral phase is characterized by reduced lingual control/coordination and prolonged bolus preparation resulting in occasional posterior spillage to the pyriform sinuses, prolonged mastication and trace-mild oral residuals. Oral residuals cleared through spontaneous additional swallow. Patient presents with largely functional pharyngeal phase remarkable for complete epiglottic inversion, laryngeal vestibule closure and adequate hyoid excursion.  There was x1 event of transient penetration during thin liquids,  though no other events of penetration/aspiration present across consistencies. Suspected esophageal retention present during pill administration, though cleared through additional boluses of puree. Recommend continuation of dysphagia 2 solids with upgrade to thin liquids. SLP will continue to follow at bedside to trial upgraded solids. Medications should be given whole in puree. Continue to utilize standardized swallowing precautions including sitting upright at 90 degrees, slow rate, small bites/sips, along with checking for oral residuals.   Factors that may increase risk of adverse event in presence of aspiration Rubye Oaks & Clearance Coots 2021): Factors that may increase risk of adverse event in presence of aspiration Rubye Oaks & Clearance Coots 2021): Frail or deconditioned; Reduced cognitive function Recommendations/Plan: Swallowing Evaluation Recommendations Swallowing Evaluation Recommendations Recommendations: PO diet PO Diet Recommendation: Dysphagia 2 (Finely chopped); Thin liquids (Level 0) Liquid Administration via: Cup; Spoon; Straw Medication Administration: Whole meds with puree Supervision: Staff to assist with self-feeding; Intermittent supervision/cueing for swallowing strategies Swallowing strategies  : Minimize environmental distractions; Slow rate; Small bites/sips; Check for pocketing or oral holding Postural changes: Position pt fully upright for meals; Stay upright 30-60 min after meals Oral care recommendations: Oral care BID (2x/day) Treatment Plan Treatment Plan Treatment recommendations: Therapy as outlined in treatment plan below Follow-up recommendations: Outpatient SLP Recommendations Comment: n/a Functional status assessment: Patient has had a recent decline in their functional status and demonstrates the ability to make significant improvements in function in a reasonable and predictable amount of time. Treatment frequency: Min 2x/week Treatment duration: 2 weeks Interventions: Aspiration precaution  training; Compensatory techniques; Patient/family education; Trials of upgraded texture/liquids; Diet toleration management by SLP; Respiratory muscle strength training Recommendations Recommendations for follow up therapy are one component of a multi-disciplinary discharge planning process, led by the attending physician.  Recommendations may be updated based on patient status, additional functional criteria and insurance authorization. Assessment: Orofacial Exam: Orofacial Exam Oral Cavity: Oral Hygiene: WFL Oral Cavity - Dentition: Missing dentition  Orofacial Anatomy: WFL Anatomy: No data recorded Boluses Administered: Boluses Administered Boluses Administered: Thin liquids (Level 0); Puree; Solid  Oral Impairment Domain: Oral Impairment Domain Lip Closure: No labial escape Tongue control during bolus hold: Escape to lateral buccal cavity/floor of mouth Bolus preparation/mastication: Slow prolonged chewing/mashing with complete recollection Bolus transport/lingual motion: Slow tongue motion; Repetitive/disorganized tongue motion Oral residue: Trace residue lining oral structures Location of oral residue : Floor of mouth Initiation of pharyngeal swallow : Pyriform sinuses; Posterior laryngeal surface of the epiglottis  Pharyngeal Impairment Domain: Pharyngeal Impairment Domain Soft palate elevation: No bolus between soft palate (SP)/pharyngeal wall (PW) Laryngeal elevation: Complete superior movement of thyroid cartilage with complete approximation of arytenoids to epiglottic petiole Anterior hyoid excursion: Complete anterior movement Epiglottic movement: Complete inversion Laryngeal vestibule closure: Complete, no air/contrast in laryngeal vestibule Pharyngeal stripping wave : Present - complete Pharyngeal contraction (A/P view only): N/A Pharyngoesophageal segment opening: Complete distension and complete duration, no obstruction of flow Tongue base retraction: No contrast between tongue base and posterior  pharyngeal wall (PPW) Pharyngeal residue: Complete pharyngeal clearance Location of pharyngeal residue: N/A  Esophageal Impairment Domain: Esophageal Impairment Domain Esophageal clearance upright position: Esophageal retention Pill: Pill Consistency administered: Thin liquids (Level 0); Puree Thin liquids (Level 0): Impaired (see clinical impressions) Puree: WFL Penetration/Aspiration Scale Score: Penetration/Aspiration Scale Score 1.  Material does not enter airway: Puree; Solid; Pill 2.  Material enters airway, remains ABOVE vocal cords then ejected out: Thin liquids (Level 0) Compensatory Strategies: Compensatory Strategies Compensatory strategies: No   General Information: No data recorded Diet Prior to this Study: Mildly thick liquids (Level 2, nectar thick); Dysphagia 2 (finely chopped)   No data recorded  Respiratory Status: WFL   Supplemental O2: None (Room air)   No data recorded Behavior/Cognition: Alert; Cooperative Self-Feeding Abilities: Able to self-feed No data recorded Volitional Cough: Able to elicit Volitional Swallow: Able to elicit Exam Limitations: No limitations Goal Planning: Prognosis for improved oropharyngeal function: Good Barriers to Reach Goals: Cognitive deficits No data recorded No data recorded Consulted and agree with results and recommendations: Patient Pain: No data recorded End of Session: Start Time:No data recorded Stop Time: No data recorded Time Calculation:No data recorded Charges: No data recorded SLP visit diagnosis: SLP Visit Diagnosis: Dysphagia, oral phase (R13.11) Past Medical History: Past Medical History: Diagnosis Date  GERD (gastroesophageal reflux disease)   Hypothyroidism   Seizures (HCC)  Past Surgical History: Past Surgical History: Procedure Laterality Date  AORTA - BILATERAL FEMORAL ARTERY BYPASS GRAFT Bilateral 01/07/2017  Procedure: AORTOBIFEMORAL BYPASS GRAFT;  Surgeon: Larina Earthly, MD;  Location: [facility] OR;  Service: Vascular;  Laterality: Bilateral;  I & D  EXTREMITY Left 11/28/2014  Procedure: IRRIGATION AND DEBRIDEMENT;  REPAIR OF CRUSH INJURY;  Surgeon: Betha Loa, MD;  Location: [facility] OR;  Service: Orthopedics;  Laterality: Left;  INCISIONAL HERNIA REPAIR N/A 10/26/2017  Procedure: HERNIA REPAIR INCISIONAL WITH MESH;  Surgeon: Franky Macho, MD;  Location: [facility] ORS;  Service: General;  Laterality: N/A;  IR CT HEAD LTD  11/30/2023  IR INTRAVSC STENT CERV CAROTID W/EMB-PROT MOD SED INCL ANGIO  11/30/2023  IR PERCUTANEOUS ART THROMBECTOMY/INFUSION INTRACRANIAL INC DIAG ANGIO  11/30/2023  IR US GUIDE VASC ACCESS RIGHT  11/30/2023  RADIOLOGY WITH ANESTHESIA N/A 11/30/2023  Procedure: IR WITH ANESTHESIA;  Surgeon: Radiologist, Medication, MD;  Location: [facility] OR;  Service: Radiology;  Laterality: N/A; Cassidi F Sockwell 12/29/2023, 9:57 AM  DG Abd 1 View Result Date: 12/15/2023 CLINICAL DATA:  Abdominal distension EXAM:  ABDOMEN - 1 VIEW COMPARISON:  12/04/2023 FINDINGS: Enteric tube tip over the second portion of duodenum. Nonobstructed gas pattern. Diverticular disease of the colon. IMPRESSION: Nonobstructed gas pattern. Enteric tube tip over the second portion of duodenum. Electronically Signed   By: Jasmine Pang M.D.   On: 12/15/2023 18:48   CT SOFT TISSUE NECK W CONTRAST Result Date: 12/07/2023 CLINICAL DATA:  Vocal cord paralysis EXAM: CT NECK WITH CONTRAST TECHNIQUE: Multidetector CT imaging of the neck was performed using the standard protocol following the bolus administration of intravenous contrast. RADIATION DOSE REDUCTION: This exam was performed according to the departmental dose-optimization program which includes automated exposure control, adjustment of the mA and/or kV according to patient size and/or use of iterative reconstruction technique. CONTRAST:  75mL OMNIPAQUE IOHEXOL 350 MG/ML SOLN COMPARISON:  None Available. FINDINGS: Pharynx and larynx: Normal. No mass or swelling. Symmetric vocal cords. Salivary glands: No inflammation, mass, or stone.  Thyroid: Normal. Lymph nodes: None enlarged or abnormal density. Vascular: Right ICA stent appears patent. Left carotid bifurcation atherosclerosis with left ICA narrowing, better characterized on prior CTA. Aortic atherosclerosis. Limited intracranial: Negative. Visualized orbits: Negative. Mastoids and visualized paranasal sinuses: Clear. Skeleton: No acute abnormality on limited assessment. Upper chest: Visualized lung apices are clear. IMPRESSION: Unremarkable CT of the neck. No visible mass or adenopathy. Symmetric vocal cords by CT. Electronically Signed   By: Feliberto Harts M.D.   On: 12/07/2023 01:13   DG Abd Portable 1V Result Date: 12/04/2023 CLINICAL DATA:  Feeding tube placement EXAM: PORTABLE ABDOMEN - 1 VIEW COMPARISON:  Abdominal x-ray 01/12/2017 FINDINGS: Feeding tube is at the level of the gastric antrum. No dilated bowel loops are seen. There is oral contrast in small bowel and colon. IMPRESSION: Feeding tube is at the level of the gastric antrum. Electronically Signed   By: Darliss Cheney M.D.   On: 12/04/2023 15:48   DG Swallowing Func-Speech Pathology Result Date: 12/04/2023 Table formatting from the original result was not included. Modified Barium Swallow Study Patient Details Name: Ian Wong MRN: [id number] Date of Birth: 17-Oct-1961 Today's Date: 12/04/2023 HPI/PMH: HPI: 63 yo male transfer from [facility] with L side weakness and R gaze deviation s/p TNK and thrombectomy 12/30. There was some concern for aspiration after vomiting episode during intubation so was kept on vent until 12/31. 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 Repeate CT 1/1 due to decreased wakefulness showed increasing mass effect on the R lateral ventricle.  PMH includes: GERD, hypothyroidism, seizures Clinical Impression: Clinical Impression: Pt has a moderate oropharyngeal dysphagia including reduced labial seal with anterior loss on the L side and slow posterior lingual transit. He has adequate oral  and pharyngeal clearance. He has reduced laryngeal elevation and laryngeal vestibule closure but he can still protect his airway well when given small boluses, controlled in size and administered by SLP. When he self-feeds, thin and nectar thick liquids spill more into the pyriform sinuses before the swallow and they end up spilling into the airway before he achieves full epiglottic inversion. Aspiration is sensed in larger volumes (PAS 7, not able to eject aspirates despite strong sounding cough) but was silent x1 (PAS 8) with smaller voume. He contains boluses more consistently above his valleculae before the swallow with honey thick liquids and purees, and as a result, there is no aspiration observed. Recommend starting with Dys 1 (puree) diet and honey thick liquids but would need full supervision to assist with small bolus sizes, getting one bite or  sip at a time. Would monitor respiratory status closely and even consider cortrak for consistency of nutrition/medications. Factors that may increase risk of adverse event in presence of aspiration Rubye Oaks & Clearance Coots 2021): Factors that may increase risk of adverse event in presence of aspiration Rubye Oaks & Clearance Coots 2021): Respiratory or GI disease; Reduced cognitive function; Limited mobility; Frail or deconditioned; Dependence for feeding and/or oral hygiene; Reduced saliva; Weak cough; Presence of tubes (ETT, trach, NG, etc.); Aspiration of thick, dense, and/or acidic materials Recommendations/Plan: Swallowing Evaluation Recommendations Swallowing Evaluation Recommendations Recommendations: PO diet PO Diet Recommendation: Dysphagia 1 (Pureed); Moderately thick liquids (Level 3, honey thick) Liquid Administration via: Cup; Spoon Medication Administration: Crushed with puree Supervision: Staff to assist with self-feeding; Full supervision/cueing for swallowing strategies Swallowing strategies  : Minimize environmental distractions; Slow rate; Small bites/sips (one  small bite or sip at a time, monitor respiratory status closely) Postural changes: Position pt fully upright for meals; Stay upright 30-60 min after meals Oral care recommendations: Oral care QID (4x/day) Caregiver Recommendations: Avoid jello, ice cream, thin soups, popsicles; Remove water pitcher; Have oral suction available Treatment Plan Treatment Plan Treatment recommendations: Therapy as outlined in treatment plan below Follow-up recommendations: Acute inpatient rehab (3 hours/day) Functional status assessment: Patient has had a recent decline in their functional status and demonstrates the ability to make significant improvements in function in a reasonable and predictable amount of time. Treatment frequency: Min 2x/week Treatment duration: 2 weeks Interventions: Aspiration precaution training; Compensatory techniques; Patient/family education; Trials of upgraded texture/liquids; Diet toleration management by SLP; Respiratory muscle strength training Recommendations Recommendations for follow up therapy are one component of a multi-disciplinary discharge planning process, led by the attending physician.  Recommendations may be updated based on patient status, additional functional criteria and insurance authorization. Assessment: Orofacial Exam: Orofacial Exam Oral Cavity - Dentition: Missing dentition Orofacial Anatomy: WFL Anatomy: Anatomy: Prominent cricopharyngeus Boluses Administered: Boluses Administered Boluses Administered: Thin liquids (Level 0); Mildly thick liquids (Level 2, nectar thick); Moderately thick liquids (Level 3, honey thick); Puree  Oral Impairment Domain: Oral Impairment Domain Lip Closure: Escape beyond mid-chin Tongue control during bolus hold: Posterior escape of greater than half of bolus Bolus preparation/mastication: -- (deferred) Bolus transport/lingual motion: Slow tongue motion Oral residue: Complete oral clearance Location of oral residue : N/A Initiation of pharyngeal  swallow : Pyriform sinuses  Pharyngeal Impairment Domain: Pharyngeal Impairment Domain Soft palate elevation: No bolus between soft palate (SP)/pharyngeal wall (PW) Laryngeal elevation: Partial superior movement of thyroid cartilage/partial approximation of arytenoids to epiglottic petiole Anterior hyoid excursion: Complete anterior movement Epiglottic movement: Complete inversion Laryngeal vestibule closure: Incomplete, narrow column air/contrast in laryngeal vestibule Pharyngeal stripping wave : Present - complete Pharyngeal contraction (A/P view only): N/A Pharyngoesophageal segment opening: Partial distention/partial duration, partial obstruction of flow Tongue base retraction: Trace column of contrast or air between tongue base and PPW Pharyngeal residue: Complete pharyngeal clearance Location of pharyngeal residue: N/A  Esophageal Impairment Domain: Esophageal Impairment Domain Esophageal clearance upright position: -- (unable to view due to body habitus) Pill: Pill Consistency administered: -- (deferred) Penetration/Aspiration Scale Score: Penetration/Aspiration Scale Score 1.  Material does not enter airway: Moderately thick liquids (Level 3, honey thick); Puree 7.  Material enters airway, passes BELOW cords and not ejected out despite cough attempt by patient: Mildly thick liquids (Level 2, nectar thick) 8.  Material enters airway, passes BELOW cords without attempt by patient to eject out (silent aspiration) : Thin liquids (Level 0) Compensatory Strategies: Compensatory Strategies Compensatory strategies: No  General Information: Caregiver present: Yes Banker)  Diet Prior to this Study: NPO   Temperature : Normal   Respiratory Status: Increased WOB; Other (comment) (wheezing)   Supplemental O2: Nasal cannula   History of Recent Intubation: Yes  Behavior/Cognition: Alert; Cooperative; Requires cueing Self-Feeding Abilities: Able to self-feed Baseline vocal quality/speech: Hypophonia/low volume; Dysphonic  Volitional Cough: Able to elicit Volitional Swallow: Able to elicit Exam Limitations: No limitations Goal Planning: Prognosis for improved oropharyngeal function: Good Barriers to Reach Goals: Cognitive deficits No data recorded Patient/Family Stated Goal: none stated Consulted and agree with results and recommendations: Patient; Physician; Advanced practice provider; Nurse Pain: Pain Assessment Pain Assessment: Faces Faces Pain Scale: 0 Facial Expression: 0 Body Movements: 0 Muscle Tension: 0 Compliance with ventilator (intubated pts.): N/A Vocalization (extubated pts.): 0 CPOT Total: 0 Pain Location: generalized with mobility Pain Descriptors / Indicators: Discomfort; Grimacing Pain Intervention(s): Monitored during session; Limited activity within patient's tolerance; Repositioned End of Session: Start Time:SLP Start Time (ACUTE ONLY): 0944 Stop Time: SLP Stop Time (ACUTE ONLY): 1005 Time Calculation:SLP Time Calculation (min) (ACUTE ONLY): 21 min Charges: SLP Evaluations $ SLP Speech Visit: 1 Visit SLP Evaluations $MBS Swallow: 1 Procedure $Swallowing Treatment: 1 Procedure SLP visit diagnosis: SLP Visit Diagnosis: Dysphagia, oropharyngeal phase (R13.12) Past Medical History: Past Medical History: Diagnosis Date  GERD (gastroesophageal reflux disease)   Hypothyroidism   Seizures (HCC)  Past Surgical History: Past Surgical History: Procedure Laterality Date  AORTA - BILATERAL FEMORAL ARTERY BYPASS GRAFT Bilateral 01/07/2017  Procedure: AORTOBIFEMORAL BYPASS GRAFT;  Surgeon: Larina Earthly, MD;  [facility];  Service: Vascular;  Laterality: Bilateral;  I & D EXTREMITY Left 11/28/2014  Procedure: IRRIGATION AND DEBRIDEMENT;  REPAIR OF CRUSH INJURY;  Surgeon: Betha Loa, MD;  Location: [facility] OR;  Service: Orthopedics;  Laterality: Left;  INCISIONAL HERNIA REPAIR N/A 10/26/2017  Procedure: HERNIA REPAIR INCISIONAL WITH MESH;  Surgeon: Franky Macho, MD;  Location: [facility] ORS;  Service: General;  Laterality: N/A;  IR CT  HEAD LTD  11/30/2023  IR INTRAVSC STENT CERV CAROTID W/EMB-PROT MOD SED INCL ANGIO  11/30/2023  IR PERCUTANEOUS ART THROMBECTOMY/INFUSION INTRACRANIAL INC DIAG ANGIO  11/30/2023  IR US GUIDE VASC ACCESS RIGHT  11/30/2023  RADIOLOGY WITH ANESTHESIA N/A 11/30/2023  Procedure: IR WITH ANESTHESIA;  Surgeon: Radiologist, Medication, MD;  Location: [facility] OR;  Service: Radiology;  Laterality: N/A; Mahala Menghini., M.A. CCC-SLP Acute Rehabilitation Services Office [phone] Secure chat preferred 12/04/2023, 10:52 AM  [place] EKG SITE RITE Result Date: 12/03/2023 If Site Rite image not attached, placement could not be confirmed due to current cardiac rhythm.  CT HEAD WO CONTRAST ( ) Result Date: 12/02/2023 CLINICAL DATA:  Neuro deficit, acute, stroke suspected. EXAM: CT HEAD WITHOUT CONTRAST TECHNIQUE: Contiguous axial images were obtained from the base of the skull through the vertex without intravenous contrast. RADIATION DOSE REDUCTION: This exam was performed according to the departmental dose-optimization program which includes automated exposure control, adjustment of the mA and/or kV according to patient size and/or use of iterative reconstruction technique. COMPARISON:  MRI brain 12/01/2023.  Head CT 11/30/2023. FINDINGS: Brain: Evolving large acute right MCA territory infarct with increasing mass effect on the right lateral ventricle. No acute hemorrhage or midline shift. Stable background of moderate chronic small-vessel disease. No hydrocephalus or extra-axial collection. Vascular: No hyperdense vessel or unexpected calcification. Skull: No calvarial fracture or suspicious bone lesion. Skull base is unremarkable. Sinuses/Orbits: Mild mucosal thickening in the right sphenoid sinus. Orbits are unremarkable. Other: None. IMPRESSION: Evolving large acute  right MCA territory infarct with increasing mass effect on the right lateral ventricle. No acute hemorrhage or midline shift. Electronically Signed   By: Orvan Falconer  M.D.   On: 12/02/2023 16:39   DG Chest Port 1 View Result Date: 12/02/2023 CLINICAL DATA:  Respiratory failure EXAM: PORTABLE CHEST 1 VIEW COMPARISON:  Chest radiograph dated 12/01/2023. FINDINGS: The heart size and mediastinal contours are within normal limits. There is mild left basilar atelectasis. The right lung is clear. No pleural effusion or pneumothorax. The visualized skeletal structures are unremarkable. IMPRESSION: Mild left basilar atelectasis. Electronically Signed   By: Romona Curls M.D.   On: 12/02/2023 10:03   MR BRAIN WO CONTRAST Result Date: 12/01/2023 CLINICAL DATA:  Provided history: Stroke, follow-up. 24 hours post TNKase. EXAM: MRI HEAD WITHOUT CONTRAST TECHNIQUE: Multiplanar, multiecho pulse sequences of the brain and surrounding structures were obtained without intravenous contrast. COMPARISON:  Prior non-contrast head CT examinations 11/30/2023 and earlier. CT angiogram head/neck 11/30/2023. FINDINGS: Brain: No age advanced or lobar predominant parenchymal atrophy. Fairly extensive acute cortical/subcortical right MCA territory infarcts affecting portions of the frontal lobe/insula, temporal lobe, parietal lobe, occipital lobe and basal ganglia. There are few superimposed small foci of susceptibility-weighted signal loss within the right basal ganglia and within/along the right insula, which may reflect chronic microhemorrhages or acute petechial hemorrhage (Heidelberg class 1a, type HI1 scattered small petechiae, no mass effect). There are a few nonspecific punctate chronic microhemorrhages scattered elsewhere within the supratentorial brain. Background multifocal T2 FLAIR hyperintense signal abnormality within the cerebral white matter, nonspecific but compatible with moderate chronic small vessel ischemic disease. Chronic lacunar infarct within the left thalamus. No evidence of an intracranial mass. No extra-axial fluid collection. No midline shift. Vascular: Maintained flow voids  within the proximal large arterial vessels. Skull and upper cervical spine: No focal worrisome marrow lesion. Sinuses/Orbits: No mass or acute finding within the imaged orbits. Mild mucosal thickening within the bilateral sphenoid sinuses. Other: Trace fluid within the left mastoid air cells. Impression #1 will be called to the ordering clinician or representative by the Radiologist Assistant, and communication documented in the PACS or [employer]. IMPRESSION: 1. Fairly extensive acute right middle cerebral artery territory infarcts. There are a few superimposed small foci of susceptibility-weighted signal loss within the right basal ganglia and within/along the right insula, which may reflect chronic microhemorrhages or acute petechial hemorrhage (Heidelberg class 1a, type HI1 scattered small petechiae, no mass effect). Consider a non-contrast head CT for further evaluation. 2. Few nonspecific punctate chronic microhemorrhages scattered elsewhere within the supratentorial brain. 3. Background moderate cerebral white matter chronic small vessel ischemic disease. 4. Chronic lacunar infarct within the left thalamus. Electronically Signed   By: Jackey Loge D.O.   On: 12/01/2023 14:39   ECHOCARDIOGRAM COMPLETE Result Date: 12/01/2023    ECHOCARDIOGRAM REPORT   Patient Name:   Ian Wong Date of Exam: 12/01/2023 Medical Rec #:  [id number]         Height:       71.0 in Accession #:    [id number]        Weight:       213.3 lb Date of Birth:  09-16-1961         BSA:          2.167 m Patient Age:    62 years          BP:           124/51 mmHg Patient Gender: M  HR:           54 bpm. Exam Location:  Inpatient Procedure: 2D Echo, Cardiac Doppler and Color Doppler Indications:    Stroke  History:        Patient has prior history of Echocardiogram examinations, most                 recent 01/04/2017. PAD and Stroke.  Sonographer:    Webb Laws Referring Phys: Erick Blinks IMPRESSIONS  1.  Technically difficult study with very limited visualization of cardiac structures.  2. Left ventricular ejection fraction, by estimation, is 50 to 55%. The left ventricle has normal function. The left ventricle has no regional wall motion abnormalities. Left ventricular diastolic parameters were normal.  3. Right ventricular systolic function is normal. The right ventricular size is normal.  4. The mitral valve is grossly normal. No evidence of mitral valve regurgitation.  5. The aortic valve was not well visualized. Aortic valve regurgitation is not visualized.  6. The inferior vena cava is dilated in size with <50% respiratory variability, suggesting right atrial pressure of 15 mmHg. FINDINGS  Left Ventricle: Left ventricular ejection fraction, by estimation, is 50 to 55%. The left ventricle has normal function. The left ventricle has no regional wall motion abnormalities. The left ventricular internal cavity size was normal in size. There is  no left ventricular hypertrophy. Left ventricular diastolic parameters were normal. Right Ventricle: The right ventricular size is normal. No increase in right ventricular wall thickness. Right ventricular systolic function is normal. Left Atrium: Left atrial size was normal in size. Right Atrium: Right atrial size was normal in size. Pericardium: There is no evidence of pericardial effusion. Mitral Valve: The mitral valve is grossly normal. No evidence of mitral valve regurgitation. Tricuspid Valve: The tricuspid valve is not well visualized. Tricuspid valve regurgitation is trivial. Aortic Valve: The aortic valve was not well visualized. Aortic valve regurgitation is not visualized. Pulmonic Valve: The pulmonic valve was not assessed. Pulmonic valve regurgitation is not visualized. Aorta: The aortic root was not well visualized. Venous: The inferior vena cava is dilated in size with less than 50% respiratory variability, suggesting right atrial pressure of 15 mmHg.  IAS/Shunts: The interatrial septum was not well visualized.  LEFT VENTRICLE PLAX 2D LVIDd:         4.40 cm      Diastology LVIDs:         3.70 cm      LV e' medial:    10.30 cm/s LV PW:         0.80 cm      LV E/e' medial:  8.0 LV IVS:        0.70 cm      LV e' lateral:   12.70 cm/s LVOT diam:     2.20 cm      LV E/e' lateral: 6.5 LV SV:         63 LV SV Index:   29 LVOT Area:     3.80 cm  LV Volumes (MOD) LV vol d, MOD A2C: 88.3 ml LV vol d, MOD A4C: 152.0 ml LV vol s, MOD A2C: 44.0 ml LV vol s, MOD A4C: 72.5 ml LV SV MOD A2C:     44.3 ml LV SV MOD A4C:     152.0 ml LV SV MOD BP:      61.7 ml RIGHT VENTRICLE             IVC RV Basal diam:  4.30 cm     IVC diam: 2.30 cm RV Mid diam:    2.90 cm RV S prime:     10.40 cm/s TAPSE (M-mode): 2.1 cm LEFT ATRIUM           Index        RIGHT ATRIUM          Index LA diam:      2.70 cm 1.25 cm/m   RA Area:     9.40 cm LA Vol (A2C): 13.0 ml 6.00 ml/m   RA Volume:   16.90 ml 7.80 ml/m LA Vol (A4C): 33.2 ml 15.32 ml/m  AORTIC VALVE LVOT Vmax:   75.70 cm/s LVOT Vmean:  48.700 cm/s LVOT VTI:    0.166 m MITRAL VALVE MV Area (PHT): 5.38 cm    SHUNTS MV Decel Time: 141 msec    Systemic VTI:  0.17 m MV E velocity: 82.10 cm/s  Systemic Diam: 2.20 cm MV A velocity: 83.70 cm/s MV E/A ratio:  0.98 Aditya Sabharwal Electronically signed by Dorthula Nettles Signature Date/Time: 12/01/2023/10:36:35 AM    Final    Portable Chest xray Result Date: 12/01/2023 CLINICAL DATA:  Respiratory failure. EXAM: PORTABLE CHEST 1 VIEW COMPARISON:  January 08, 2017. FINDINGS: The heart size and mediastinal contours are within normal limits. Endotracheal and nasogastric tubes are unchanged. Minimal bibasilar subsegmental atelectasis is again noted. The visualized skeletal structures are unremarkable. IMPRESSION: Stable support apparatus. Minimal bibasilar subsegmental atelectasis. Electronically Signed   By: Lupita Raider M.D.   On: 12/01/2023 10:05    Labs:  Basic Metabolic Panel: Recent  Labs  Lab 12/28/23 0532  NA 133*  K 4.1  CL 103  CO2 22  GLUCOSE 97  BUN 13  CREATININE 1.22  CALCIUM 8.5*    CBC: Recent Labs  Lab 12/28/23 0532  WBC 5.5  HGB 14.7  HCT 41.0  MCV 88.4  PLT 196    CBG: Recent Labs  Lab 12/29/23 2058 12/30/23 0709 12/30/23 1238 12/30/23 1638 12/30/23 2145  GLUCAP 90 111* 104* 97 87   Family history.  Mother with COPD.  Denies any colon cancer esophageal cancer or rectal cancer  Brief HPI:   Kacin Dancy is a 63 y.o. right-handed male with history significant for GERD hypothyroidism, remote seizure, PAD with bilateral femoral artery bypass 2018, quit smoking 7 years ago.  Per chart review lives with spouse.  Mobile home with a ramped entrance.  Independent prior to admission.  They do have a son that is disabled.  Presented to [facility] 11/30/2023 with right gaze deviation and left hemiplegia/slurred speech of acute onset.  Cranial CT scan showed hyperdense right M1 MCA concerning for thrombus.  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  Patient did receive TNK at [facility].  CT angiogram of the head and neck occluded right ICA at its origin with nonopacification throughout the neck.  Severe right vertebral artery origin stenosis.  Severe stenosis of the intradural small/nondominant left vertebral artery.  Approximately 70% stenosis of the left ICA origin.  Admission chemistries unremarkable except WBC 14,400 hemoglobin A1c 5.9.  [facility] was transferred [facility] underwent cerebral angiogram mechanical thrombectomy as well as right carotid stenting and angioplasty 11/30/2023 per Dr. Geralyn Flash.  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  Few nonspecific punctate chronic microhemorrhages scattered elsewhere within the supratentorial brain.  Chronic lacunar infarct within the left thalamus.  Echocardiogram ejection fraction of  50 to 55% no  wall motion abnormalities.  Follow-up placed on low-dose aspirin as well as Brilinta 90 mg twice daily for CVA prophylaxis.  Lovenox for DVT prophylaxis.  Hospital course dysphagia currently on dysphagia #2 nectar thick liquid diet with nasogastric tube feeds for nutritional support.  There was question of COPD exacerbation respiratory distress some exertional wheezing placed on prednisone with taper completing a 5-day course of Unasyn.  He was being weaned from nasal cannula oxygen.  Patient with mild hyponatremia 133 and monitored.  Therapy evaluations completed due to patient decreased functional ability left side weakness was admitted for a comprehensive rehab program.   Hospital Course: Caedyn Raygoza was admitted to rehab 12/11/2023 for inpatient therapies to consist of PT, ST and OT at least three hours five days a week. Past admission physiatrist, therapy team and rehab RN have worked together to provide customized collaborative inpatient rehab.  Pertaining to patient's right MCA infarction due to right ICA and MCA occlusion status post TNK with interventional radiology right carotid stenting.  Patient would remain on low-dose aspirin and Brilinta 90 mg twice daily per neurology services as well as follow-up interventional radiology.  Lovenox for DVT prophylaxis no bleeding episodes.  Mood stabilization with the addition of Lexapro and emotional support provided.  Noted dysphagia currently on a dysphagia #2 nectar thick liquid diet advanced as tolerated he did initially have a nasogastric tube for nutritional support.  Lipitor for hyperlipidemia.  Nexium ongoing for GERD.  Synthroid hormone supplement for hypothyroidism.  COPD exacerbation respiratory distress oxygen saturations maintained he did complete a course of Unasyn as well as prednisone taper.   Blood pressures were monitored on TID basis and controlled     Rehab course: During patient's stay in rehab weekly team conferences were held  to monitor patient's progress, set goals and discuss barriers to discharge. At admission, patient required min mod assist 18 feet rolling walker moderate assist step pivot transfers  Physical exam.  Blood pressure 124/71 pulse 74 temperature 95 respiration 17 oxygen saturation is 97% room air Constitutional.  No acute distress HEENT Head.  Normocephalic and atraumatic Eyes.  Pupils round and reactive to light no discharge without nystagmus Neck.  Supple nontender no JVD without thyromegaly Cardiac regular rate and rhythm without any extra sounds or murmur heard Abdomen.  Soft nontender positive bowel sounds without rebound Respiratory effort normal no respiratory distress without wheeze  Neurologic.  Patient is alert oriented to person place month/year and reason.  Demonstrates fair insight and awareness.  Able to provide limited biographical information.  Right gaze preference.  Can come to the left with cueing.  Speech with moderate volume but dysarthric.  Left central 7.  Decreased sensation of left face.  Manual muscle testing left upper extremity 2/5 deltoids biceps triceps and 2+ left wrist and hand, left lower extremity appears to be 4 - to 4/5.  Sensation 1+/2 left arm and leg.  DTRs 3+.  He/She  has had improvement in activity tolerance, balance, postural control as well as ability to compensate for deficits. He/She has had improvement in functional use RUE/LUE  and RLE/LLE as well as improvement in awareness.  Patient ambulated 200 feet using no assistive device contact-guard/supervision.  Donned shoes with set up and supervision of right shoe and contact-guard for left shoe.  Requires set up tub room to simulate home environment.  Worked on sitting on edge and pivoting legs.  Supervision upper body bathing, contact-guard lower body bathing, minimal assist  upper body dressing, minimal assist lower body dressing.  Speech therapy continue to follow for dysphagia advancing diet as tolerated  providing education to patient and family.  Full family teaching completed plan discharge to home       Disposition:  Discharge disposition: 06-Home-Health Care Svc        Diet: Dysphagia #2 thin liquids  Special Instructions: No driving smoking or alcohol  Medications at discharge. 1.  Tylenol as needed 2.  Aspirin 81 mg p.o. daily 3.  Lipitor 20 mg p.o. daily 4.  Colace liquid 5 mL p.o. daily 5.  Nexium 20 mg p.o. daily 6.  Synthroid 50 mcg p.o. daily 7.  Brilinta 90 mg p.o. twice daily 8.  Lexapro 5 mg nightly 9.  Pantanol ophthalmic solution 0.1% 1 drop left eye twice daily 10.  Omega-3 fatty acids 1 g twice daily 11.  Tums 1 tablet twice daily as needed indigestion or heartburn  30-35 minutes were spent completing discharge summary and discharge planning  Discharge Instructions     Ambulatory referral to Neurology   Complete by: As directed    An appointment is requested in approximately: 4 weeks right MCA infarction   Ambulatory referral to Physical Medicine Rehab   Complete by: As directed    Medical complexity follow-up 1 to 2 weeks right MCA infarction        Follow-up Information     Kirsteins, Victorino Sparrow, MD Follow up.   Specialty: Physical Medicine and Rehabilitation Why: Office to call for appointment Contact information: [address] [place] [address] [phone]         Baldemar Lenis, MD Follow up.   Specialties: Radiology, Interventional Radiology Why: Call for appointment Contact information: [address] [place] [address] [phone]                 Signed: Charlton Amor 12/31/2023, 4:55 AM

## 2023-12-14 ENCOUNTER — Encounter (HOSPITAL_COMMUNITY): Payer: Self-pay | Admitting: Physical Medicine & Rehabilitation

## 2023-12-14 ENCOUNTER — Other Ambulatory Visit: Payer: Self-pay

## 2023-12-14 LAB — CBC WITH DIFFERENTIAL/PLATELET
Abs Immature Granulocytes: 0.29 10*3/uL — ABNORMAL HIGH (ref 0.00–0.07)
Basophils Absolute: 0.1 10*3/uL (ref 0.0–0.1)
Basophils Relative: 1 %
Eosinophils Absolute: 0.2 10*3/uL (ref 0.0–0.5)
Eosinophils Relative: 1 %
HCT: 44.2 % (ref 39.0–52.0)
Hemoglobin: 15.3 g/dL (ref 13.0–17.0)
Immature Granulocytes: 2 %
Lymphocytes Relative: 12 %
Lymphs Abs: 1.4 10*3/uL (ref 0.7–4.0)
MCH: 30.5 pg (ref 26.0–34.0)
MCHC: 34.6 g/dL (ref 30.0–36.0)
MCV: 88.2 fL (ref 80.0–100.0)
Monocytes Absolute: 1.6 10*3/uL — ABNORMAL HIGH (ref 0.1–1.0)
Monocytes Relative: 14 %
Neutro Abs: 8.3 10*3/uL — ABNORMAL HIGH (ref 1.7–7.7)
Neutrophils Relative %: 70 %
Platelets: 353 10*3/uL (ref 150–400)
RBC: 5.01 MIL/uL (ref 4.22–5.81)
RDW: 13.3 % (ref 11.5–15.5)
WBC: 11.9 10*3/uL — ABNORMAL HIGH (ref 4.0–10.5)
nRBC: 0 % (ref 0.0–0.2)

## 2023-12-14 LAB — COMPREHENSIVE METABOLIC PANEL
ALT: 76 U/L — ABNORMAL HIGH (ref 0–44)
AST: 37 U/L (ref 15–41)
Albumin: 3 g/dL — ABNORMAL LOW (ref 3.5–5.0)
Alkaline Phosphatase: 62 U/L (ref 38–126)
Anion gap: 8 (ref 5–15)
BUN: 29 mg/dL — ABNORMAL HIGH (ref 8–23)
CO2: 25 mmol/L (ref 22–32)
Calcium: 8.6 mg/dL — ABNORMAL LOW (ref 8.9–10.3)
Chloride: 101 mmol/L (ref 98–111)
Creatinine, Ser: 1.03 mg/dL (ref 0.61–1.24)
GFR, Estimated: >60 mL/min (ref >60)
Glucose, Bld: 137 mg/dL — ABNORMAL HIGH (ref 70–99)
Potassium: 3.8 mmol/L (ref 3.5–5.1)
Sodium: 134 mmol/L — ABNORMAL LOW (ref 135–145)
Total Bilirubin: 0.8 mg/dL (ref 0.0–1.2)
Total Protein: 6.8 g/dL (ref 6.5–8.1)

## 2023-12-14 LAB — GLUCOSE, CAPILLARY
Glucose-Capillary: 154 mg/dL — ABNORMAL HIGH (ref 70–99)
Glucose-Capillary: 148 mg/dL — ABNORMAL HIGH (ref 70–99)
Glucose-Capillary: 162 mg/dL — ABNORMAL HIGH (ref 70–99)
Glucose-Capillary: 171 mg/dL — ABNORMAL HIGH (ref 70–99)

## 2023-12-14 MED ORDER — PROSOURCE TF20 ENFIT COMPATIBL EN LIQD
60.0000 mL | Freq: Every day | ENTERAL | Status: DC
  Administered 2023-12-15 – 2023-12-22 (×8): 60 mL
  Filled 2023-12-14 (×8): qty 60

## 2023-12-14 MED ORDER — OSMOLITE 1.5 CAL PO LIQD
750.0000 mL | ORAL | Status: DC
  Administered 2023-12-14: 750 mL
  Filled 2023-12-14: qty 948

## 2023-12-14 MED ORDER — IPRATROPIUM-ALBUTEROL 0.5-2.5 (3) MG/3ML IN SOLN
3.0000 mL | Freq: Two times a day (BID) | RESPIRATORY_TRACT | Status: DC
  Administered 2023-12-15 – 2023-12-16 (×3): 3 mL via RESPIRATORY_TRACT
  Filled 2023-12-14 (×3): qty 3

## 2023-12-14 NOTE — Progress Notes (Signed)
 Nutrition Follow-up  DOCUMENTATION CODES:   Not applicable  INTERVENTION:   Modify tube feeding via Cortrak. : Osmolite 1.5 at 50 ml/h x 15 hours (750 ml per day) Prosource TF20 60 ml QD 150 ml FWF Q4H Provides 1125 kcal (56% est needs), 67 gm protein (68% est needs), 571 ml free water  daily (1471 ml TF+flush-73% est needs) Continue to monitor PO intake and adjust TF rate as needed Mighty Shake (nectar thick) TID with meals    Ensure Enlive po TID between meals    NUTRITION DIAGNOSIS:  Inadequate oral intake related to dysphagia, acute illness as evidenced by meal completion < 50%, other (comment) (need for tube feeding).  GOAL:  Patient will meet greater than or equal to 90% of their needs - meeting with tube feed/ongoing  MONITOR:  PO intake, Weight trends, Supplement acceptance, TF tolerance, Diet advancement, Skin, Labs  REASON FOR ASSESSMENT:   Consult Enteral/tube feeding initiation and management  ASSESSMENT:   Pt with hx of GERD and seizure disorder presented to ED with R gaze deviation and difficulty speaking. Imaging in ED suggestive of ischemic right MCA stroke.  12/30 - Intubated, TNKase  and mechanical thrombectomy with right carotid stenting and angioplasty, NPO  12/31 - Extubated, NPO 1/2 - Hypertonic saline discontinued 1/3 - MBS, DYS1 diet, honey thick liquids, Cortrak placed  1/7 - MBS, DYS1 diet, nectar thick liquids, adjusted to nocturnal feeds 1/10 - DYS 2 diet, nectar thick liquids, transfer to CIR 1/11 - Swallow eval, DYS 1 diet, nectar thick liquids  Calorie count ordered x 48 hours. Obtained four meals documented. Patient averaging 150 calories (7.5% est needs) and 8g (8% est needs) protein per meal. This is grossly inadequate. Downgraded to D1, nectar thick on 01/11. At bedside, patient stating, I'm just not hungry. Could not elaborate on what may increase appetite or foods he would prefer. In order to discern whether this is appetite-related or  whether patient may need additional cuing, will decrease nocturnal feeds to meet 60% of needs to see if appetite returns.   Abdomen appears distended. States he has not had a bowel movement since Friday (01/11). Senokot and Colace ordered. Wt trending down likely d/t muscle loss and poor PO intake. Continue to reiterate importance of meal intake to progress with therapy and preserve lean body mass. Patient with flat affect and one word answers/short phrases during interaction.  Patient requesting to D/C Corktrak completely. Discussed the need to verify adequate PO intake before D/C feedings and Cortrak tube to prevent having to replace.    Admit Weight: 99.5kg Current Weight: 89.4kg  Labs: Na+ 134<--133 CBGs 137-156mg /d x 2 draws A1c 5.9% (11/2023) WBC 11.9<--13.1<--10.9  Meds: docusate, esomeprazole , SSI, levothyroxine , prednisone     Diet Order:   Diet Order             DIET - DYS 1 Room service appropriate? Yes; Fluid consistency: Nectar Thick  Diet effective now             EDUCATION NEEDS:  Not appropriate for education at this time  Skin:  Skin Assessment: Skin Integrity Issues: Incisions: R thigh  Last BM:  12/11/2023, type 6-large  Height:  Ht Readings from Last 1 Encounters:  12/11/23 5' 10 (1.778 m)    Weight:  Wt Readings from Last 1 Encounters:  12/11/23 89.4 kg    Ideal Body Weight:  75.5 kg  BMI:  Body mass index is 28.28 kg/m.  Estimated Nutritional Needs:   Kcal:  2000-2250 kcal/day  Protein:  98-113 gm/day  Fluid:  2000-2250 mL/day   Blair Deaner MS, RD, LDN Registered Dietitian Clinical Nutrition RD Inpatient Contact Info in Amion

## 2023-12-14 NOTE — Progress Notes (Signed)
 Physical Therapy Session Note  Patient Details  Name: Ian Wong MRN: [id number] Date of Birth: 25-Feb-1961  Today's Date: 12/14/2023 PT Individual Time: 8599-8561 PT Individual Time Calculation (min): 38 min   And  Today's Date: 12/14/2023 PT Missed Time: 22 min Missed Time Reason: Other (pt eating lunch)   Short Term Goals: Week 1:  PT Short Term Goal 1 (Week 1): Pt will complete bed mobility with supervision PT Short Term Goal 2 (Week 1): Pt will complete transfers CGA with LRAD PT Short Term Goal 3 (Week 1): Pt will ambulate >150' with CGA and LRAD PT Short Term Goal 4 (Week 1): Pt will complete gait up/down ramp with CGA  Skilled Therapeutic Interventions/Progress Updates:  Patient seated on EOB on entrance to room. Pt with brother-in-law visiting. Patient alert and agreeable to PT session, however is attempting to complete lunch. NT notified of pt's position and desire to consume ensure shake and magic cup ice cream.   Pt missed  22 min of skilled therapy due to eating lunch. Will re-attempt as schedule and pt availability permits.  Returned to room with pt still sitting EOB with no visitor but still with tray table in front of him.   Patient with no pain complaint at start of session.  Therapeutic Activity: Pt supervised for completion to fulfillment of magic cup and ensure. Finished with lunch. Reminded pt to use tongue to check for pocketing in L side of mouth.  Transfers: Pt performed sit<>stand transfers with CGA throughout session.  Stand pivot transfers with CGA/ MinA throughout session potentially d/t fatigue. Provided vc for L attn to step height/ length, LLE advancement.   Gait Training:  Ambulatory transfer to/ from bathroom with close supervision for transfer. MinA for complete clothing mgmt. No AD used. Further ambulation in hallway to bring focus to items on L side of hallway and stopping to look at/ touch items with spinal rotation/ twist to L in order  promote increased spinal mobility and body proprioception. Pt has difficulty and preferring to turn with feet rather than spinal twist.  Patient supine in bed at end of session with brakes locked, bed alarm set, and all needs within reach.   Therapy Documentation Precautions:  Precautions Precautions: Fall Precaution Comments: L inattention, coretrak Restrictions Weight Bearing Restrictions Per Provider Order: No  Pain: No pain complaint this session.   Therapy/Group: Individual Therapy  Mliss DELENA Milliner PT, DPT, CSRS 12/14/2023, 2:09 PM

## 2023-12-14 NOTE — IPOC Note (Signed)
 Overall Plan of Care [facility]) Patient Details Name: Ian Wong MRN: [id number] DOB: Apr 23, 1961  Admitting Diagnosis: Right middle cerebral artery stroke [facility])  Hospital Problems: Principal Problem:   Right middle cerebral artery stroke [facility])     Functional Problem List: Nursing Bowel, Pain, Safety, Endurance, Medication Management  PT Balance, Endurance, Motor, Perception, Safety, Sensory  OT Balance, Cognition, Endurance, Motor, Perception, Safety, Sensory, Vision  SLP Nutrition, Linguistic, Cognition  TR         Basic ADL's: OT Eating, Grooming, Bathing, Dressing, Toileting     Advanced  ADL's: OT       Transfers: PT Bed Mobility, Bed to Chair, Customer Service Manager, Tub/Shower     Locomotion: PT Ambulation, Stairs     Additional Impairments: OT Fuctional Use of Upper Extremity  SLP Swallowing, Social Cognition   Problem Solving, Attention, Memory, Awareness  TR      Anticipated Outcomes Item Anticipated Outcome  Self Feeding independent  Swallowing  mod i   Basic self-care  supervision  Toileting  supervision   Bathroom Transfers supervision to toilet, CGA with tub transfers  Bowel/Bladder  manage bowel with mod I assist  Transfers  supervision  Locomotion  supervision ambulatory  Communication     Cognition  supervision  Pain  < 4 with prns  Safety/Judgment  manage w cues   Therapy Plan: PT Intensity: Minimum of 1-2 x/day ,45 to 90 minutes PT Frequency: 5 out of 7 days PT Duration Estimated Length of Stay: 12-14 days OT Intensity: Minimum of 1-2 x/day, 45 to 90 minutes OT Frequency: 5 out of 7 days OT Duration/Estimated Length of Stay: 12-14 days SLP Intensity: Minumum of 1-2 x/day, 30 to 90 minutes SLP Frequency: 3 to 5 out of 7 days SLP Duration/Estimated Length of Stay: 18-21 days   Team Interventions: Nursing Interventions Patient/Family Education, Disease Management/Prevention, Discharge Planning, Pain Management, Bowel Management,  Medication Management, Dysphagia/Aspiration Precaution Training  PT interventions Ambulation/gait training, Discharge planning, Functional mobility training, Psychosocial support, Therapeutic Activities, Visual/perceptual remediation/compensation, Balance/vestibular training, Disease management/prevention, Neuromuscular re-education, Therapeutic Exercise, Cognitive remediation/compensation, DME/adaptive equipment instruction, Splinting/orthotics, Pain management, UE/LE Strength taining/ROM, Community reintegration, Equities Trader education, Museum/gallery curator, UE/LE Coordination activities, Functional electrical stimulation  OT Interventions Warden/ranger, Cognitive remediation/compensation, DME/adaptive equipment instruction, Discharge planning, Functional mobility training, Neuromuscular re-education, Psychosocial support, Patient/family education, Pain management, Self Care/advanced ADL retraining, UE/LE Strength taining/ROM, Therapeutic Exercise, Therapeutic Activities, UE/LE Coordination activities, Visual/perceptual remediation/compensation  SLP Interventions Cognitive remediation/compensation, Cueing hierarchy, Dysphagia/aspiration precaution training, Functional tasks, Internal/external aids, Patient/family education, Therapeutic Activities  TR Interventions    SW/CM Interventions Discharge Planning, Psychosocial Support, Patient/Family Education   Barriers to Discharge MD  Nutritional means  Nursing Decreased caregiver support 1 level mobile home/ ramped entry  w spouse; has physically disabled son at the home  PT Decreased caregiver support, Home environment access/layout lives with wife and son, wife cares for son with disability  OT      SLP      SW Decreased caregiver support, Lack of/limited family support, Community Education Officer for SNF coverage     Team Discharge Planning: Destination: PT-Home ,OT- Home , SLP-Home Projected Follow-up: PT-Home health PT, OT-  Home health OT,  SLP-Home Health SLP, Outpatient SLP Projected Equipment Needs: PT-To be determined, OT- To be determined, SLP-None recommended by SLP Equipment Details: PT- , OT-shower seat vs tub bench Patient/family involved in discharge planning: PT- Patient,  OT-Patient, SLP-Patient  MD ELOS: 18-21d Medical Rehab Prognosis:  Good Assessment: The patient has  been admitted for CIR therapies with the diagnosis of RIght MCA CVA. The team will be addressing functional mobility, strength, stamina, balance, safety, adaptive techniques and equipment, self-care, bowel and bladder mgt, patient and caregiver education, dysphagia due to stroke. Goals have been set at Supervision. Anticipated discharge destination is home with family support .        See Team Conference Notes for weekly updates to the plan of care

## 2023-12-14 NOTE — Progress Notes (Signed)
 Inpatient Rehabilitation  Patient information reviewed and entered into eRehab system by Burnard Mealing, OTR/L, Rehab Quality Coordinator.   Information including medical coding, functional ability and quality indicators will be reviewed and updated through discharge.

## 2023-12-14 NOTE — Progress Notes (Signed)
 PROGRESS NOTE   Subjective/Complaints:  Reviewed labs  Discussed goals of >50% meal intake in order to d/c cortrak   ROS: as per HPI. Denies CP, SOB, abd pain, N/V/D/C, or any other complaints at this time.    Objective:   No results found. Recent Labs    12/14/23 0505  WBC 11.9*  HGB 15.3  HCT 44.2  PLT 353   Recent Labs    12/14/23 0505  NA 134*  K 3.8  CL 101  CO2 25  GLUCOSE 137*  BUN 29*  CREATININE 1.03  CALCIUM  8.6*         Intake/Output Summary (Last 24 hours) at 12/14/2023 0825 Last data filed at 12/13/2023 1440 Gross per 24 hour  Intake 286 ml  Output --  Net 286 ml        Physical Exam: Vital Signs Blood pressure (!) 122/91, pulse 85, temperature 98.6 F (37 C), resp. rate 17, height 5' 10 (1.778 m), weight 89.4 kg, SpO2 100%.   General: No acute distress Mood and affect are appropriate Heart: Regular rate and rhythm no rubs murmurs or extra sounds Lungs: Clear to auscultation, breathing unlabored, no rales or wheezes Abdomen: Positive bowel sounds, soft nontender to palpation, nondistended Extremities: No clubbing, cyanosis, or edema Skin: No evidence of breakdown, no evidence of rash Neurologic: Cranial nerves II through XII intact, motor strength is 5/5 in right  deltoid, bicep, tricep, grip, hip flexor, knee extensors, ankle dorsiflexor and plantar flexor Sensory exam normal sensation to light touch and proprioception in bilateral upper and lower extremities Cerebellar exam normal finger to nose to finger as well as heel to shin in bilateral upper and lower extremities Musculoskeletal: Full range of motion in all 4 extremities. No joint swelling  Skin:    General: Skin is warm and dry.  Psychiatric:     Comments: Flat, slow to engage, but pleasant and cooperative      Assessment/Plan: 1. Functional deficits which require 3+ hours per day of interdisciplinary therapy in a  comprehensive inpatient rehab setting. Physiatrist is providing close team supervision and 24 hour management of active medical problems listed below. Physiatrist and rehab team continue to assess barriers to discharge/monitor patient progress toward functional and medical goals  Care Tool:  Bathing    Body parts bathed by patient: Chest, Abdomen, Buttocks, Front perineal area, Right upper leg, Left upper leg, Face, Left arm   Body parts bathed by helper: Right arm, Right lower leg, Left lower leg     Bathing assist Assist Level: Moderate Assistance - Patient 50 - 74%     Upper Body Dressing/Undressing Upper body dressing   What is the patient wearing?: Pull over shirt    Upper body assist Assist Level: Moderate Assistance - Patient 50 - 74%    Lower Body Dressing/Undressing Lower body dressing      What is the patient wearing?: Incontinence brief, Pants     Lower body assist Assist for lower body dressing: Moderate Assistance - Patient 50 - 74%     Toileting Toileting    Toileting assist Assist for toileting: Maximal Assistance - Patient 25 - 49%  Transfers Chair/bed transfer  Transfers assist     Chair/bed transfer assist level: Minimal Assistance - Patient > 75%     Locomotion Ambulation   Ambulation assist      Assist level: Minimal Assistance - Patient > 75% Assistive device: No Device Max distance: 150'   Walk 10 feet activity   Assist     Assist level: Minimal Assistance - Patient > 75% Assistive device: No Device   Walk 50 feet activity   Assist    Assist level: Minimal Assistance - Patient > 75% Assistive device: No Device    Walk 150 feet activity   Assist    Assist level: Minimal Assistance - Patient > 75% Assistive device: No Device    Walk 10 feet on uneven surface  activity   Assist Walk 10 feet on uneven surfaces activity did not occur: Safety/medical concerns         Wheelchair     Assist Is the  patient using a wheelchair?: Yes Type of Wheelchair: Manual    Wheelchair assist level: Dependent - Patient 0%      Wheelchair 50 feet with 2 turns activity    Assist        Assist Level: Dependent - Patient 0%   Wheelchair 150 feet activity     Assist      Assist Level: Dependent - Patient 0%   Blood pressure (!) 122/91, pulse 85, temperature 98.6 F (37 C), resp. rate 17, height 5' 10 (1.778 m), weight 89.4 kg, SpO2 100%.  Medical Problem List and Plan: 1. 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  Antithrombotics: -DVT/anticoagulation:  Pharmaceutical: Lovenox  40mg  daily             -antiplatelet therapy: Aspirin  81 mg daily and Brilinta  90 mg twice daily 3. Pain Management: Tylenol  as needed 4. Mood/Behavior/Sleep: Provide emotional support  -may be experiencing some early depression. Told me he regrets not taking care of himself earlier in his life             -monitor for now. Consider SSRI             -neuropsych eval when provider is available             -antipsychotic agents: N/A 5. Neuropsych/cognition: This patient is capable of making decisions on his own behalf. 6. Skin/Wound Care: Routine skin checks 7. Fluids/Electrolytes/Nutrition: Routine in and outs with follow-up chemistries on Monday    Latest Ref Rng & Units 12/14/2023    5:05 AM 12/11/2023    7:05 AM 12/09/2023    6:17 AM  BMP  Glucose 70 - 99 mg/dL 862  843  850   BUN 8 - 23 mg/dL 29  28  31    Creatinine 0.61 - 1.24 mg/dL 8.96  9.17  8.98   Sodium 135 - 145 mmol/L 134  133  136   Potassium 3.5 - 5.1 mmol/L 3.8  4.0  4.1   Chloride 98 - 111 mmol/L 101  102  103   CO2 22 - 32 mmol/L 25  21  22    Calcium  8.9 - 10.3 mg/dL 8.6  8.5  8.4    BMET stable 1/13 8.   Dysphagia.  Dysphagia #2 nectar thick liquids.   -Continue nasogastric tube for nutritional support over weekend. Consider reducing rate of Osmolite to 40cc/hr to help stimulate PO intake.    -Advance diet per speech therapy -12/12/23 leave rate for now, no documentation of intake and pt just arrived; monitor; CBGs up but probably from TF/steroids? No dx of DM mentioned. Continue SSI -12/13/23 pt feels full and thus isn't eating as much (only two meals documented in flowsheet, 10% and 0% eaten); agreed that we'll decrease the rate to 10ml/hr but will need to try to eat more of his trays. CBGs ok.  Intake still poor 0-40% documented on 1/12 CBG (last 3)  Recent Labs    12/13/23 1628 12/13/23 2124 12/14/23 0623  GLUCAP 195* 121* 154*   CBG generally in desired range  9.  Hyperlipidemia.  Lipitor 20mg  daily 10.  GERD.  Nexium  20mg  daily 11.  Hypothyroidism.  Synthroid  50mcg daily 12.  COPD exacerbation/respiratory distress.  Patient completed 5-day course of Unasyn .  Prednisone  taper.   -pt on room air currently. 13. HTN?: unclear if this is a new diagnosis? No mention in notes. BPs elevated overnight. Monitor for now -12/13/23 BPs still slightly elevated, if trend continues, may need to start meds- defer to weekday for now  Vitals:   12/11/23 1557 12/11/23 1915 12/12/23 0641 12/12/23 1430  BP: (!) 144/76 130/71 (!) 155/79 (!) 148/70   12/12/23 2227 12/13/23 0424 12/13/23 1431 12/13/23 1941  BP: 117/65 (!) 144/78 129/61 128/67   12/14/23 0322  BP: (!) 122/91    14. Bowel regimen: continue colace 100mg  daily.   Small type 6 stool recorded on 1/11  LOS: 3 days A FACE TO FACE EVALUATION WAS PERFORMED  Ian Wong 12/14/2023, 8:25 AM

## 2023-12-14 NOTE — Progress Notes (Signed)
 Physical Therapy Session Note  Patient Details  Name: Ian Wong MRN: [id number] Date of Birth: 09-10-1961  Today's Date: 12/14/2023 PT Individual Time: 0907-1005 PT Individual Time Calculation (min): 58 min   Short Term Goals: Week 1:  PT Short Term Goal 1 (Week 1): Pt will complete bed mobility with supervision PT Short Term Goal 2 (Week 1): Pt will complete transfers CGA with LRAD PT Short Term Goal 3 (Week 1): Pt will ambulate >150' with CGA and LRAD PT Short Term Goal 4 (Week 1): Pt will complete gait up/down ramp with CGA  Skilled Therapeutic Interventions/Progress Updates:  Patient suoine in bed on entrance to room. Patient alert and agreeable to PT session.   Patient with no pain complaint at start of session. Relates need to toilet.   Therapeutic Activity: Bed Mobility: Pt performed supine > sit with supervision and need for use of bed features. VC/ tc required for technique to coordinate breathing with effort in order to reduce holding breath. Transfers/ NMR: Pt performed sit<>stand transfers with MinA initially and improves to CGA with slight adjustments and sequence of scooting forward, positioning feet, then hands and then producing forward trunk weight shift through hip flexion. Stand pivot transfers throughout session with use of RW and CGA with intermittent need for cues to increase L step height and step positioning.  Toilet transfer performed with CGA and MinA for doffing pants completely. Is able to don pants in standing with assist for L side of brief which required adjustment. Cued for use of L hand.   Car transfer completed with light CGA and pt sequencing correctly. Minimal cue for sitting first prior to descent to sit.   Gait Training:  Pt ambulated 15' x1/ 67' x1 using RW with CGA. On return trip to room, pt is able to complete distance of ~130 ft with no AD. Demonstrated intermittent decrease in L step height/ length and cued for self correction which is  intermittent as well. Increased height RW provided as pt using youth RW and pt with increased trunk flexion in use.   TUG completed in avg of 23.17 sec with no use of AD.  (A score of >13.5 seconds indicates patient is at a high fall risk. Pt educated on interpretation of their score)   Neuromuscular Re-ed: NMR facilitated during session with focus on standing balance and motor control. Pt guided in proper sequencing of sit<>stand transfers for improved control and use of BLE in rise to stand and descent to sit with no UE use. Improves to supervision for sit<>stand and CGA for stand pivot. NMR performed for improvements in motor control and coordination, balance, sequencing, judgement, and self confidence/ efficacy in performing all aspects of mobility at highest level of independence.   Patient supine in bed at end of session with brakes locked, bed alarm set, and all needs within reach.  Therapy Documentation Precautions:  Precautions Precautions: Fall Precaution Comments: L inattention, coretrak Restrictions Weight Bearing Restrictions Per Provider Order: No  Pain:  No pain indicated this session. Minimal discomfort with cortrak.    Therapy/Group: Individual Therapy  Mliss DELENA Milliner PT, DPT, CSRS 12/14/2023, 12:58 PM

## 2023-12-14 NOTE — Care Management (Signed)
 Inpatient Rehabilitation Center Individual Statement of Services  Patient Name:  Ian Wong  Date:  12/14/2023  Welcome to the Inpatient Rehabilitation Center.  Our goal is to provide you with an individualized program based on your diagnosis and situation, designed to meet your specific needs.  With this comprehensive rehabilitation program, you will be expected to participate in at least 3 hours of rehabilitation therapies Monday-Friday, with modified therapy programming on the weekends.  Your rehabilitation program will include the following services:  Physical Therapy (PT), Occupational Therapy (OT), Speech Therapy (ST), 24 hour per day rehabilitation nursing, Therapeutic Recreaction (TR), Psychology, Neuropsychology, Care Coordinator, Rehabilitation Medicine, Nutrition Services, Pharmacy Services, and Other  Weekly team conferences will be held on Wednesdays to discuss your progress.  Your Inpatient Rehabilitation Care Coordinator will talk with you frequently to get your input and to update you on team discussions.  Team conferences with you and your family in attendance may also be held.  Expected length of stay: 12-14 days  Overall anticipated outcome: Supervision  Depending on your progress and recovery, your program may change. Your Inpatient Rehabilitation Care Coordinator will coordinate services and will keep you informed of any changes. Your Inpatient Rehabilitation Care Coordinator's name and contact numbers are listed  below.  The following services may also be recommended but are not provided by the Inpatient Rehabilitation Center:  Driving Evaluations Home Health Rehabiltiation Services Outpatient Rehabilitation Services Vocational Rehabilitation   Arrangements will be made to provide these services after discharge if needed.  Arrangements include referral to agencies that provide these services.  Your insurance has been verified to be:  [place] Medicaid [employer]  Your primary doctor is:  No PCP listed  Pertinent information will be shared with your doctor and your insurance company.  Inpatient Rehabilitation Care Coordinator:  Graeme Feliciana SILK [phone] or ([phone]  Information discussed with and copy given to patient by: Graeme DELENA Feliciana, 12/14/2023, 9:41 AM

## 2023-12-14 NOTE — Progress Notes (Signed)
 Inpatient Rehabilitation Care Coordinator Assessment and Plan Patient Details  Name: Ian Wong MRN: [id number] Date of Birth: 05-Feb-1961  Today's Date: 12/14/2023  Hospital Problems: Principal Problem:   Right middle cerebral artery stroke [facility])  Past Medical History:  Past Medical History:  Diagnosis Date   GERD (gastroesophageal reflux disease)    Hypothyroidism    Seizures (HCC)    Past Surgical History:  Past Surgical History:  Procedure Laterality Date   AORTA - BILATERAL FEMORAL ARTERY BYPASS GRAFT Bilateral 01/07/2017   Procedure: AORTOBIFEMORAL BYPASS GRAFT;  Surgeon: Ian JULIANNA Doing, MD;  Location: [facility] OR;  Service: Vascular;  Laterality: Bilateral;   I & D EXTREMITY Left 11/28/2014   Procedure: IRRIGATION AND DEBRIDEMENT;  REPAIR OF CRUSH INJURY;  Surgeon: Ian Curia, MD;  Location: [facility] OR;  Service: Orthopedics;  Laterality: Left;   INCISIONAL HERNIA REPAIR N/A 10/26/2017   Procedure: HERNIA REPAIR INCISIONAL WITH MESH;  Surgeon: Ian Anes, MD;  Location: [facility] ORS;  Service: General;  Laterality: N/A;   IR CT HEAD LTD  11/30/2023   IR INTRAVSC STENT CERV CAROTID W/EMB-PROT MOD SED INCL ANGIO  11/30/2023   IR PERCUTANEOUS ART THROMBECTOMY/INFUSION INTRACRANIAL INC DIAG ANGIO  11/30/2023   IR US  GUIDE VASC ACCESS RIGHT  11/30/2023   RADIOLOGY WITH ANESTHESIA N/A 11/30/2023   Procedure: IR WITH ANESTHESIA;  Surgeon: Radiologist, Medication, MD;  Location: [facility] OR;  Service: Radiology;  Laterality: N/A;   Social History:  reports that he quit smoking about 7 years ago. His smoking use included cigarettes. He has never used smokeless tobacco. He reports that he does not currently use alcohol. He reports that he does not use drugs.  Family / Support Systems Marital Status: Married How Long?: 31 years Patient Roles: Spouse Spouse/Significant Other: Ian Wong (wife) Children: Ian Wong (disabled son) Other Supports: PRN support from family Anticipated Caregiver:  Wife Ability/Limitations of Caregiver: Wife cares for their adult disable son. Can only provide supervision level of care. Caregiver Availability: 24/7 Family Dynamics: Pt lives with his wife and son.  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 No current issues Guardian/Conservator: Denies   Abuse/Neglect Abuse/Neglect Assessment Can Be Completed: Yes Physical Abuse: Denies Verbal Abuse: Denies Sexual Abuse: Denies Exploitation of patient/patient's resources: Denies Self-Neglect: Denies  Patient response to: Social Isolation - How often do you feel lonely or isolated from those around you?: Never  Emotional Status Pt's affect, behavior and adjustment status: Pt in good spirits at time of visit Recent Psychosocial Issues: Denies Psychiatric History: Denies Substance Abuse History: Pt reports he quit smoking cigarettes 8 years ago after requring vascualr surgery. Denies etoh and rec drug use.  Patient / Family Perceptions, Expectations & Goals Pt/Family understanding of illness & functional limitations: Pt and family have a general understanding of pt care needs Premorbid pt/family roles/activities: Independent Anticipated changes in roles/activities/participation: Assistance with ADLs/IADLs Pt/family expectations/goals: pt goal is to work towards getting his left/arm and leg back in tact with his mind as it was before.  Community [employer] Agencies: None Premorbid Home  Care/DME Agencies: None Transportation available at discharge: Wife Is the patient able to respond to transportation needs?: Yes In the past 12 months, has lack of transportation kept you from medical appointments or from getting medications?: No In the past 12 months, has lack of transportation kept you from meetings, work, or from getting things needed for daily living?: No Resource referrals recommended: Neuropsychology  Discharge Planning Living Arrangements: Spouse/significant other Support Systems: Spouse/significant other, Other relatives Type of Residence: Private residence Insurance Resources: Media Planner (specify) ([place] Medicaid [employer] Community) Surveyor, Quantity Resources: Employment Financial Screen Referred: No Living Expenses: Own Money Management: Patient Does the patient have any problems obtaining your medications?: No Home Management: Pt reports his wife manages all meals and house cleaning Patient/Family Preliminary Plans: No changes Care Coordinator Barriers to Discharge: Decreased caregiver support, Lack of/limited family support, Insurance for SNF coverage Care Coordinator Anticipated Follow Up Needs: HH/OP Expected length of stay: 12-14 days  Clinical Impression SW met with pt in room to introduce self, explain role, discuss discharge process and inform on ELOS. Pt is not a cytogeneticist. No HCPOA. No DME.   Ian Wong A Ian Wong 12/14/2023, 3:05 PM

## 2023-12-14 NOTE — Progress Notes (Signed)
 Occupational Therapy Session Note  Patient Details  Name: Ian Wong MRN: [id number] Date of Birth: 20-Sep-1961  Today's Date: 12/14/2023 OT Individual Time: 1050-1205 OT Individual Time Calculation (min): 75 min    Short Term Goals: Week 1:  OT Short Term Goal 1 (Week 1): pt will demonstrate improved left side awareness to don L arm and L leg in clothing first to follow through with hemi dressing strategies OT Short Term Goal 2 (Week 1): Pt will don shirt with min A to demonstrate improved memory of dressing techniques. OT Short Term Goal 3 (Week 1): Pt will use L hand to hold wash cloth and wash R arm with 50 % A. OT Short Term Goal 4 (Week 1): pt will ambulated to toilet with CGA with LRAD.  Skilled Therapeutic Interventions/Progress Updates:     Pt received in bed ready for therapy.  Focus of therapy session on LUE Motor control and awareness.       ADL Retraining: -pt used toilet 2x at start and end of session with CGA  Transfers: -ambulated to gym and back to room with CGA/min A  Balance: -CGA with standing activities   Neuromuscular Re-Education:  -the majority of the session, pt worked on guided aarom activities for LUE.  Pt has increased tone in UE with moderate resistance felt in elbow.  Pt worked on [employer]on bearing, owens & minor, towel slides, ball rolls.  Grasping with towel squeezes, cone grasping and stacking.  Pt needs mod cues to fully attend to arm and cues to try to fully open fingers to pick up an object.    Pt participated well but often needs cues due to decreased attention to left and delayed processing with motor tasks.  He has approximately 25% use of arm and is using it as a stabilizing assist.    Upon return to room had pt lay supine to work on overhead tricep extensions and reaching hand to ceiling.  PROM of sh flexion limited to 90 (possibly due to tone), pt reports he did not have limited  ROM prior to his CVA.     Pt resting in bed with all needs  met. Alarm set and call light in reach.     Therapy Documentation Precautions:  Precautions Precautions: Fall Precaution Comments: L inattention, coretrak Restrictions Weight Bearing Restrictions Per Provider Order: No   Pain:  No c/o pain  ADL: ADL Eating: Minimal assistance Grooming: Minimal assistance Upper Body Bathing: Minimal assistance Where Assessed-Upper Body Bathing: Shower Lower Body Bathing: Minimal assistance Where Assessed-Lower Body Bathing: Shower Upper Body Dressing: Moderate assistance Where Assessed-Upper Body Dressing: Edge of bed Lower Body Dressing: Maximal assistance Where Assessed-Lower Body Dressing: Edge of bed Toileting: Maximal assistance Where Assessed-Toileting: Teacher, Adult Education: Curator Method: Surveyor, Minerals: Acupuncturist: Insurance Underwriter Method: Designer, Industrial/product: Grab bars, Transfer tub bench   Therapy/Group: Individual Therapy  Jylan Loeza 12/14/2023, 9:01 AM

## 2023-12-14 NOTE — Progress Notes (Signed)
 Patient ID: Ian Wong, male   DOB: June 01, 1961, 63 y.o.   MRN: [id number]  [phone]- SW spoke with pt wife to introduce self, explain role, discuss discharge process, and inform on ELOS. She confirms she will be at the home caring for their disabled son, however, unsure about the amount of additional support. States their son gets PCS [facility], however, pt was assisting on days when there was no aide care. States currently her family has pitched in, to help care for their son. She is aware SW will submit PCS referral through his insurance, and will follow-up with updates after team conference.   Graeme Jude, MSW, LCSW Office: [phone] Cell: [phone] Fax: [phone]

## 2023-12-15 ENCOUNTER — Inpatient Hospital Stay (HOSPITAL_COMMUNITY): Payer: Medicaid Other

## 2023-12-15 LAB — GLUCOSE, CAPILLARY
Glucose-Capillary: 159 mg/dL — ABNORMAL HIGH (ref 70–99)
Glucose-Capillary: 168 mg/dL — ABNORMAL HIGH (ref 70–99)
Glucose-Capillary: 173 mg/dL — ABNORMAL HIGH (ref 70–99)
Glucose-Capillary: 106 mg/dL — ABNORMAL HIGH (ref 70–99)

## 2023-12-15 MED ORDER — OSMOLITE 1.5 CAL PO LIQD
750.0000 mL | ORAL | Status: DC
  Administered 2023-12-15: 750 mL
  Filled 2023-12-15: qty 948

## 2023-12-15 NOTE — Progress Notes (Signed)
 PROGRESS NOTE   Subjective/Complaints:  Small stool yesterday , poor appetite this am TF is still running    ROS: as per HPI. Denies CP, SOB, abd pain, N/V/D/C, or any other complaints at this time.    Objective:   No results found. Recent Labs    12/14/23 0505  WBC 11.9*  HGB 15.3  HCT 44.2  PLT 353   Recent Labs    12/14/23 0505  NA 134*  K 3.8  CL 101  CO2 25  GLUCOSE 137*  BUN 29*  CREATININE 1.03  CALCIUM  8.6*         Intake/Output Summary (Last 24 hours) at 12/15/2023 0801 Last data filed at 12/14/2023 1800 Gross per 24 hour  Intake 473 ml  Output --  Net 473 ml        Physical Exam: Vital Signs Blood pressure 138/72, pulse 73, temperature 98.6 F (37 C), resp. rate 16, height 5' 10 (1.778 m), weight 89.4 kg, SpO2 100%.   General: No acute distress Mood and affect are appropriate Heart: Regular rate and rhythm no rubs murmurs or extra sounds Lungs: Clear to auscultation, breathing unlabored, no rales or wheezes Abdomen: Positive bowel sounds, soft nontender to palpation, mildly distended, tympany in epigastric area, no hepatomegaly  Extremities: No clubbing, cyanosis, or edema Skin: No evidence of breakdown, no evidence of rash Neurologic: Cranial nerves II through XII intact, motor strength is 5/5 in right  deltoid, bicep, tricep, grip, hip flexor, knee extensors, ankle dorsiflexor and plantar flexor 3- LUE, 4/5 LLE  Musculoskeletal: Full range of motion in all 4 extremities. No joint swelling  Skin:    General: Skin is warm and dry.  Psychiatric:     Comments: Flat, slow to engage, but pleasant and cooperative      Assessment/Plan: 1. Functional deficits which require 3+ hours per day of interdisciplinary therapy in a comprehensive inpatient rehab setting. Physiatrist is providing close team supervision and 24 hour management of active medical problems listed below. Physiatrist  and rehab team continue to assess barriers to discharge/monitor patient progress toward functional and medical goals  Care Tool:  Bathing    Body parts bathed by patient: Chest, Abdomen, Buttocks, Front perineal area, Right upper leg, Left upper leg, Face, Left arm   Body parts bathed by helper: Right arm, Right lower leg, Left lower leg     Bathing assist Assist Level: Moderate Assistance - Patient 50 - 74%     Upper Body Dressing/Undressing Upper body dressing   What is the patient wearing?: Pull over shirt    Upper body assist Assist Level: Moderate Assistance - Patient 50 - 74%    Lower Body Dressing/Undressing Lower body dressing      What is the patient wearing?: Incontinence brief, Pants     Lower body assist Assist for lower body dressing: Moderate Assistance - Patient 50 - 74%     Toileting Toileting    Toileting assist Assist for toileting: Maximal Assistance - Patient 25 - 49%     Transfers Chair/bed transfer  Transfers assist     Chair/bed transfer assist level: Contact Guard/Touching assist     Locomotion Ambulation  Ambulation assist      Assist level: Contact Guard/Touching assist Assistive device: No Device Max distance: 150'   Walk 10 feet activity   Assist     Assist level: Contact Guard/Touching assist Assistive device: No Device   Walk 50 feet activity   Assist    Assist level: Contact Guard/Touching assist Assistive device: No Device    Walk 150 feet activity   Assist    Assist level: Minimal Assistance - Patient > 75% Assistive device: No Device    Walk 10 feet on uneven surface  activity   Assist Walk 10 feet on uneven surfaces activity did not occur: Safety/medical concerns         Wheelchair     Assist Is the patient using a wheelchair?: Yes Type of Wheelchair: Manual    Wheelchair assist level: Dependent - Patient 0%      Wheelchair 50 feet with 2 turns activity    Assist         Assist Level: Dependent - Patient 0%   Wheelchair 150 feet activity     Assist      Assist Level: Dependent - Patient 0%   Blood pressure 138/72, pulse 73, temperature 98.6 F (37 C), resp. rate 16, height 5' 10 (1.778 m), weight 89.4 kg, SpO2 100%.  Medical Problem List and Plan: 1. 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  Antithrombotics: -DVT/anticoagulation:  Pharmaceutical: Lovenox  40mg  daily             -antiplatelet therapy: Aspirin  81 mg daily and Brilinta  90 mg twice daily 3. Pain Management: Tylenol  as needed 4. Mood/Behavior/Sleep: Provide emotional support  -may be experiencing some early depression. Told me he regrets not taking care of himself earlier in his life             -monitor for now. Consider SSRI             -neuropsych eval when provider is available             -antipsychotic agents: N/A 5. Neuropsych/cognition: This patient is capable of making decisions on his own behalf. 6. Skin/Wound Care: Routine skin checks 7. Fluids/Electrolytes/Nutrition: Routine in and outs with follow-up chemistries on Monday    Latest Ref Rng & Units 12/14/2023    5:05 AM 12/11/2023    7:05 AM 12/09/2023    6:17 AM  BMP  Glucose 70 - 99 mg/dL 862  843  850   BUN 8 - 23 mg/dL 29  28  31    Creatinine 0.61 - 1.24 mg/dL 8.96  9.17  8.98   Sodium 135 - 145 mmol/L 134  133  136   Potassium 3.5 - 5.1 mmol/L 3.8  4.0  4.1   Chloride 98 - 111 mmol/L 101  102  103   CO2 22 - 32 mmol/L 25  21  22    Calcium  8.9 - 10.3 mg/dL 8.6  8.5  8.4    BMET stable 1/13 8.  Dysphagia.  Dysphagia #2 nectar thick liquids.   -Continue nasogastric tube for nutritional support over weekend. Consider reducing rate of Osmolite to 40cc/hr  to help  stimulate PO intake.    -Advance diet per speech therapy -12/12/23 leave rate for now, no documentation of intake and pt just arrived; monitor; CBGs up but probably from TF/steroids? No dx of DM mentioned. Continue SSI -12/13/23 pt feels full and thus isn't eating as much (only two meals documented in flowsheet, 10% and 0% eaten); agreed that we'll decrease the rate to 106ml/hr but will need to try to eat more of his trays. CBGs ok.  Intake still poor 0-40% documented on 1/12 and 1/13 CBG (last 3)  Recent Labs    12/14/23 1626 12/14/23 2140 12/15/23 0618  GLUCAP 162* 171* 159*   CBG generally in desired range  9.  Hyperlipidemia.  Lipitor 20mg  daily 10.  GERD.  Nexium  20mg  daily 11.  Hypothyroidism.  Synthroid  50mcg daily 12.  COPD exacerbation/respiratory distress.  Patient completed 5-day course of Unasyn .  Prednisone  taper.   -pt on room air currently. 13. HTN?: unclear if this is a new diagnosis? No mention in notes. BPs elevated overnight. Monitor for now -1/14 controlled   Vitals:   12/11/23 1557 12/11/23 1915 12/12/23 0641 12/12/23 1430  BP: (!) 144/76 130/71 (!) 155/79 (!) 148/70   12/12/23 2227 12/13/23 0424 12/13/23 1431 12/13/23 1941  BP: 117/65 (!) 144/78 129/61 128/67   12/14/23 0322 12/14/23 1400 12/14/23 2015 12/15/23 0453  BP: (!) 122/91 130/67 137/82 138/72    14. Bowel regimen: continue colace 100mg  daily.   Small type 6 stool recorded on 1/11 and 1/12, check KUB  LOS: 4 days A FACE TO FACE EVALUATION WAS PERFORMED  Prentice FORBES Compton 12/15/2023, 8:01 AM

## 2023-12-15 NOTE — Progress Notes (Signed)
 Occupational Therapy Session Note  Patient Details  Name: Ian Wong MRN: [id number] Date of Birth: May 15, 1961  Today's Date: 12/15/2023 OT Individual Time: 1015-1100 OT Individual Time Calculation (min): 45 min    Short Term Goals: Week 1:  OT Short Term Goal 1 (Week 1): pt will demonstrate improved left side awareness to don L arm and L leg in clothing first to follow through with hemi dressing strategies OT Short Term Goal 2 (Week 1): Pt will don shirt with min A to demonstrate improved memory of dressing techniques. OT Short Term Goal 3 (Week 1): Pt will use L hand to hold wash cloth and wash R arm with 50 % A. OT Short Term Goal 4 (Week 1): pt will ambulated to toilet with CGA with LRAD.  Skilled Therapeutic Interventions/Progress Updates:    Pt received in bed and agreeable to a shower.  Pt ambulated to bathroom with CGA and sat on toilet for a few minutes and then transitioned to the shower.  Pt using LUE actively with cues to wash R arm but needed min A for thoroughness.  Pt stood with L hand on bar and used R hand to wash bottom.  Cues for thoroughness.  Pt dried off and transferred to seat in bathroom to dress.  He continues to need mod A and mod cues with UB dressing, but actually was able to don pants and socks with min A.  Pt ambulated to recliner and sat to rest.  Belt alarm and all needs met.   Therapy Documentation Precautions:  Precautions Precautions: Fall Precaution Comments: L inattention, coretrak Restrictions Weight Bearing Restrictions Per Provider Order: No    Vital Signs: Therapy Vitals Temp: 98.6 F (37 C) Pulse Rate: 68 Resp: 16 BP: 138/72 Patient Position (if appropriate): Lying Oxygen Therapy SpO2: 99 % O2 Device: Room Air Pain: no c/o pain    ADL: ADL Eating: Minimal assistance Grooming: Minimal assistance Upper Body Bathing: Minimal assistance Where Assessed-Upper Body Bathing: Shower Lower Body Bathing: Minimal assistance Where  Assessed-Lower Body Bathing: Shower Upper Body Dressing: Moderate assistance Where Assessed-Upper Body Dressing: Edge of bed Lower Body Dressing: Maximal assistance Where Assessed-Lower Body Dressing: Edge of bed Toileting: Maximal assistance Where Assessed-Toileting: Teacher, Adult Education: Curator Method: Surveyor, Minerals: Acupuncturist: Insurance Underwriter Method: Designer, Industrial/product: Grab bars, Transfer tub bench   Therapy/Group: Individual Therapy  Jamond Neels 12/15/2023, 8:52 AM

## 2023-12-15 NOTE — Progress Notes (Signed)
 Physical Therapy Session Note  Patient Details  Name: Ian Wong MRN: [id number] Date of Birth: 11/01/1961  Today's Date: 12/15/2023 PT Individual Time: 0817-0905 PT Individual Time Calculation (min): 48 min  and Today's Date: 12/15/2023 PT Missed Time: 12 Minutes Missed Time Reason: Nursing care;Other (Comment) (Resp tech in room with breathing treatment.)  Short Term Goals: Week 1:  PT Short Term Goal 1 (Week 1): Pt will complete bed mobility with supervision PT Short Term Goal 2 (Week 1): Pt will complete transfers CGA with LRAD PT Short Term Goal 3 (Week 1): Pt will ambulate >150' with CGA and LRAD PT Short Term Goal 4 (Week 1): Pt will complete gait up/down ramp with CGA  Skilled Therapeutic Interventions/Progress Updates:  Patient supine in bed on entrance to room. Patient alert and agreeable to PT session. Pt on feeding and requested to have feeding stopped for therapy session. Questioned RN on recommendation from previous day to further reduce tube feeds in order to reduce pt's feeling of fullness in morning to allow for appetite to increase. RN to f/u.   Resp tech then arrives to provide breathing treatment and this therapist returned shortly after completion.   Patient with no pain complaint at start of session.  Therapeutic Activity: Bed Mobility: Pt performed supine > sit with improved ability from previous day. Less effort noted and reaches EOB with supervision. VC provided for connecting exhale with effort as pt noted to be holding breath. Transfers: Pt performed sit<>stand transfers with supervision and stand pivot transfers with close supervision throughout session with CGA/ supervision. Provided vc for forward hip hinge for improved sit<>stand with reduced UE use, pairing exhale with efforts, and LLE positioning in pivot stepping.  Gait Training:  Pt ambulated 175 ft x2 using no AD with close supervision/ CGA. Demonstrated decreased step height/ length with LLE,  decreased awareness of L peripheral vision, decreased ability to rotate to L, and no arm swing on L. Provided vc/ tc for all with minimal adjustments made.  Neuromuscular Re-ed: NMR facilitated during session with focus on standing balance, proprioception, motor control. Pt guided in Berg Balance Test. Patient demonstrates increased fall risk as noted by score of 33 /56 on Berg Balance Scale.  (<36= high risk for falls, close to 100%; 37-45 significant >80%; 46-51 moderate >50%; 52-55 lower >25%). Pt demonstrating difficulty with forward hip hinge/ reach, EC condition, NBOS conditions, and relies heavily on RLE to perform most conditions in testing.   NMR performed for improvements in motor control and coordination, balance, sequencing, judgement, and self confidence/ efficacy in performing all aspects of mobility at highest level of independence.   Patient supine in bed at end of session with brakes locked, bed alarm set, and all needs within reach. Breakfast on tray table and encouraged to eat.    Therapy Documentation Precautions:  Precautions Precautions: Fall Precaution Comments: L inattention, coretrak Restrictions Weight Bearing Restrictions Per Provider Order: No  Pain:  No pain related this session.   Balance: Balance Balance Assessed: Yes Standardized Balance Assessment Standardized Balance Assessment: Berg Balance Test Berg Balance Test Sit to Stand: Able to stand  independently using hands Standing Unsupported: Able to stand 2 minutes with supervision Sitting with Back Unsupported but Feet Supported on Floor or Stool: Able to sit safely and securely 2 minutes Stand to Sit: Controls descent by using hands Transfers: Able to transfer with verbal cueing and /or supervision Standing Unsupported with Eyes Closed: Unable to keep eyes closed 3 seconds but stays  steady Standing Ubsupported with Feet Together: Able to place feet together independently but unable to hold for 30  seconds From Standing, Reach Forward with Outstretched Arm: Can reach forward >5 cm safely (2) From Standing Position, Pick up Object from Floor: Able to pick up shoe, needs supervision From Standing Position, Turn to Look Behind Over each Shoulder: Turn sideways only but maintains balance Turn 360 Degrees: Able to turn 360 degrees safely but slowly Standing Unsupported, Alternately Place Feet on Step/Stool: Able to complete >2 steps/needs minimal assist Standing Unsupported, One Foot in Front: Able to plae foot ahead of the other independently and hold 30 seconds Standing on One Leg: Able to lift leg independently and hold equal to or more than 3 seconds Total Score: 33   Therapy/Group: Individual Therapy  Mliss DELENA Milliner PT, DPT, CSRS 12/15/2023, 7:56 AM

## 2023-12-15 NOTE — Progress Notes (Signed)
 Physical Therapy Session Note  Patient Details  Name: Ian Wong MRN: [id number] Date of Birth: August 05, 1961  Today's Date: 12/15/2023 PT Individual Time: 8597-8551 PT Individual Time Calculation (min): 46 min   Short Term Goals: Week 1:  PT Short Term Goal 1 (Week 1): Pt will complete bed mobility with supervision PT Short Term Goal 2 (Week 1): Pt will complete transfers CGA with LRAD PT Short Term Goal 3 (Week 1): Pt will ambulate >150' with CGA and LRAD PT Short Term Goal 4 (Week 1): Pt will complete gait up/down ramp with CGA  Skilled Therapeutic Interventions/Progress Updates:  Patient supine in bed on entrance to room. Patient alert and agreeable to PT session. Has just returned from toileting with NT.   Patient with no pain complaint at start of session.  Therapeutic Activity: Bed Mobility: Pt performed supine <> sit with supervision. VC/ tc required for positioning, especially when returning to supine. Transfers: Pt performed sit<>stand and stand pivot transfers throughout session with supervision. Provided vc for positioning/ technique.  Gait Training:  Pt ambulated 150 ft x2 using no AD with close supervision/ CGA. Demonstrated continued decreased step height/ length and reduced attn to L body mobility. Provided vc/ tc for corrections with pt unable to self-correct.  Neuromuscular Re-ed: NMR facilitated during session with focus on standing balance, spinal rotation, increasing awareness of L hemibody motor control and proprioception. Pt guided in lateral stepping to grasp bean bags on table placed to pt's R side, grasp with R hand and put beanbag in pt's L hand, then lateral step to L to place on matching color disc. Pt demos trouble maintaining grasp to beanbag with decreased pinch grip and/or grasp - wants to continue to turn L, most likely d/t visual deficit. Requires MinA to maintain forward body positioning. When completed, has seated rest break and then continues  lateral stepping with return of bags to R side with L hand. Pt continues to demo turn to L side. Requiring mild physical block to maintain forward positioning.   NMR performed for improvements in motor control and coordination, balance, sequencing, judgement, and self confidence/ efficacy in performing all aspects of mobility at highest level of independence.   Patient supine in bed at end of session with brakes locked, bed alarm set, and all needs within reach.  Therapy Documentation Precautions:  Precautions Precautions: Fall Precaution Comments: L inattention, coretrak Restrictions Weight Bearing Restrictions Per Provider Order: No  Pain:  No pain related this session.   Therapy/Group: Individual Therapy  Mliss DELENA Milliner PT, DPT, CSRS 12/15/2023, 5:54 PM

## 2023-12-15 NOTE — Progress Notes (Signed)
 Speech Language Pathology Daily Session Note  Patient Details  Name: Ian Wong MRN: [id number] Date of Birth: 11-22-1961  Today's Date: 12/15/2023 SLP Individual Time: 1100-1158 SLP Individual Time Calculation (min): 58 min  Short Term Goals: Week 1: SLP Short Term Goal 1 (Week 1): Patient will utilize swallowing compenstory strategies during consumption of least restrictive diet given min multimodal A SLP Short Term Goal 2 (Week 1): Patient will increase speech intelligibility to 100% during conversation with use of strategies given min multimodal A SLP Short Term Goal 3 (Week 1): Patient will demonstrate problem solving in mildly complex situations given mod multimodal A SLP Short Term Goal 4 (Week 1): Patient will recall and utilize memory compensatory strategies given mod multimodal A SLP Short Term Goal 5 (Week 1): Patient will demonstrate awareness of cognitive and physical changes since admission given mod multimodal A  Skilled Therapeutic Interventions: Skilled therapy session focused on dysphagia and cognitive goals. SLP faciliated session by offering trials of D1/NTL textures. Patient consumed PO with adequate mastication times, oral clearance and no s/sx of aspiration throughout. SLP continued to educate patient on importance of lingual sweep during solids to ensure oral clearance. Recommend continuation of current diet (D1/NTL) with standardized swallowing precautions and full supervision. SLP addressed cognition through L attention and problem solving task. Patient was instructed to utilize problem solving skills to categorize items according to shape, color, or number. Patient completed this task given supervision A. During L attention task, patient required increased cues to attend to L side of table. SLP placed colored cards accross table span and instructed patient to categorize according to color. Patient required minA to attend to L. Patient left in chair with alarm set and  call bell in reach. Continue POC.  Pain No pain reported   Therapy/Group: Individual Therapy  Dayveon Halley M.A., CF-SLP 12/15/2023, 7:43 AM

## 2023-12-16 DIAGNOSIS — I69391 Dysphagia following cerebral infarction: Secondary | ICD-10-CM

## 2023-12-16 DIAGNOSIS — G8114 Spastic hemiplegia affecting left nondominant side: Secondary | ICD-10-CM

## 2023-12-16 DIAGNOSIS — I639 Cerebral infarction, unspecified: Secondary | ICD-10-CM

## 2023-12-16 LAB — GLUCOSE, CAPILLARY
Glucose-Capillary: 150 mg/dL — ABNORMAL HIGH (ref 70–99)
Glucose-Capillary: 120 mg/dL — ABNORMAL HIGH (ref 70–99)
Glucose-Capillary: 101 mg/dL — ABNORMAL HIGH (ref 70–99)
Glucose-Capillary: 94 mg/dL (ref 70–99)

## 2023-12-16 MED ORDER — IPRATROPIUM-ALBUTEROL 0.5-2.5 (3) MG/3ML IN SOLN
3.0000 mL | RESPIRATORY_TRACT | Status: DC | PRN
  Administered 2023-12-17 – 2023-12-24 (×3): 3 mL via RESPIRATORY_TRACT
  Filled 2023-12-16 (×3): qty 3

## 2023-12-16 MED ORDER — OSMOLITE 1.5 CAL PO LIQD
600.0000 mL | ORAL | Status: DC
  Administered 2023-12-16: 600 mL
  Filled 2023-12-16 (×2): qty 711

## 2023-12-16 NOTE — Progress Notes (Signed)
 Speech Language Pathology Daily Session Note  Patient Details  Name: Ian Wong MRN: [id number] Date of Birth: [birth year]  Today's Date: 12/16/2023 SLP Individual Time: 1100-1159 SLP Individual Time Calculation (min): 59 min  Short Term Goals: Week 1: SLP Short Term Goal 1 (Week 1): Patient will utilize swallowing compenstory strategies during consumption of least restrictive diet given min multimodal A SLP Short Term Goal 2 (Week 1): Patient will increase speech intelligibility to 100% during conversation with use of strategies given min multimodal A SLP Short Term Goal 3 (Week 1): Patient will demonstrate problem solving in mildly complex situations given mod multimodal A SLP Short Term Goal 4 (Week 1): Patient will recall and utilize memory compensatory strategies given mod multimodal A SLP Short Term Goal 5 (Week 1): Patient will demonstrate awareness of cognitive and physical changes since admission given mod multimodal A  Skilled Therapeutic Interventions: Skilled therapy session focused on dysphagia and cognitive goals. SLP faciliated session by reviewing swallowing compensatory strategies and completing pharyngeal strengthening exercises. Patient completed x10 repetitions of chin tucks against resistance, masako and effortful swallows given minA. SLP observed PO trials of D2/NTLwith x1 immediate cough after 1/12 trials of NTL which may indicate bolus misdirection. Patient with functional improvements in buccal pocketing this date as he utilized lingual sweep and liquid wash with minA to clear L sided residuals. Recommend continuation of D1/NTL at this time with continued D2 trials. Full supervision. SLP addressed cognitive goals through completion of simple financial management task. Patient utilized minA to count and organize coins according to verbalized amount. At the end of the session, patient independently recalled all activities completed in ST this date. Patient left in bed  with alarm set and call bell in reach. Continue POC.  Pain None reported   Therapy/Group: Individual Therapy  Luisfelipe Engelstad M.A., CF-SLP 12/16/2023, 7:36 AM

## 2023-12-16 NOTE — Progress Notes (Signed)
 Occupational Therapy Session Note  Patient Details  Name: Ian Wong MRN: [id number] Date of Birth: 18-Aug-1961  Today's Date: 12/16/2023 OT Individual Time: 0915-1000 OT Individual Time Calculation (min): 45 min    Short Term Goals: Week 1:  OT Short Term Goal 1 (Week 1): pt will demonstrate improved left side awareness to don L arm and L leg in clothing first to follow through with hemi dressing strategies OT Short Term Goal 2 (Week 1): Pt will don shirt with min A to demonstrate improved memory of dressing techniques. OT Short Term Goal 3 (Week 1): Pt will use L hand to hold wash cloth and wash R arm with 50 % A. OT Short Term Goal 4 (Week 1): pt will ambulated to toilet with CGA with LRAD.  Skilled Therapeutic Interventions/Progress Updates:    Sat at EOB and doff shirt with min cues and then min to mod to don shirt.  Improved attention to left arm today.    Transferred to w/c to go to gym vs ambulation to save time as therapy time limited.  Focused on  LUE NMR. Sitting on mat pt placed L hand on UE ranger for numerous repetitions on reaching hand out in various angles with cues to fully extend elbow. Pt easily distracted by other people in the gym.  Basketball holds for forced use and attention to LUE  with rotating ball side to side with cues to tap each knee following therapists cues of counting so pt had to keep up with the counts.    Bounce and catch of ball with improved attention to L hand. Pt also practiced tossing ball to therapist. Pt returned to room and opted to stay in w/c. Belt alarm on and all needs met.   Therapy Documentation Precautions:  Precautions Precautions: Fall Precaution Comments: L inattention, coretrak Restrictions Weight Bearing Restrictions Per Provider Order: No Vital Signs: Oxygen Therapy SpO2: 97 % O2 Device: Room Air Pain:  No c/o pain  ADL: ADL Eating: Minimal assistance Grooming: Minimal assistance Upper Body Bathing: Minimal  assistance Where Assessed-Upper Body Bathing: Shower Lower Body Bathing: Contact guard Where Assessed-Lower Body Bathing: Shower Upper Body Dressing: Moderate assistance Where Assessed-Upper Body Dressing: Edge of bed Lower Body Dressing: Maximal assistance Where Assessed-Lower Body Dressing: Edge of bed Toileting: Maximal assistance Where Assessed-Toileting: Teacher, adult education: Curator Method: Surveyor, minerals: Acupuncturist: Insurance underwriter Method: Designer, industrial/product: Grab bars, Transfer tub bench   Therapy/Group: Individual Therapy  Blanka Rockholt 12/16/2023, 8:31 AM

## 2023-12-16 NOTE — Progress Notes (Signed)
 PROGRESS NOTE   Subjective/Complaints:  Abd xray personally reviewed  - Barium coated small stool balls descending colon , moderate amount of gas  Medium type 7 stool this am and feels like he needs to use toilet again Poor appetite , TF still running  Ate 100% dinner !  ROS: as per HPI. Denies CP, SOB, abd pain, N/V/D/C, or any other complaints at this time.    Objective:   DG Abd 1 View Result Date: 12/15/2023 CLINICAL DATA:  Abdominal distension EXAM: ABDOMEN - 1 VIEW COMPARISON:  12/04/2023 FINDINGS: Enteric tube tip over the second portion of duodenum. Nonobstructed gas pattern. Diverticular disease of the colon. IMPRESSION: Nonobstructed gas pattern. Enteric tube tip over the second portion of duodenum. Electronically Signed   By: Esmeralda Hedge M.D.   On: 12/15/2023 18:48   Recent Labs    12/14/23 0505  WBC 11.9*  HGB 15.3  HCT 44.2  PLT 353   Recent Labs    12/14/23 0505  NA 134*  K 3.8  CL 101  CO2 25  GLUCOSE 137*  BUN 29*  CREATININE 1.03  CALCIUM  8.6*         Intake/Output Summary (Last 24 hours) at 12/16/2023 0758 Last data filed at 12/16/2023 0454 Gross per 24 hour  Intake 473 ml  Output 1 ml  Net 472 ml        Physical Exam: Vital Signs Blood pressure (!) 149/75, pulse 92, temperature 98.1 F (36.7 C), resp. rate 17, height 5\' 10"  (1.778 m), weight 96.6 kg, SpO2 97%.   General: No acute distress Mood and affect are appropriate Heart: Regular rate and rhythm no rubs murmurs or extra sounds Lungs: Clear to auscultation, breathing unlabored, no rales or wheezes Abdomen: Positive bowel sounds, soft nontender to palpation, mildly distended, tympany in epigastric area, no hepatomegaly  Extremities: No clubbing, cyanosis, or edema Skin: No evidence of breakdown, no evidence of rash Neurologic: Cranial nerves II through XII intact, motor strength is 5/5 in right  deltoid, bicep, tricep, grip,  hip flexor, knee extensors, ankle dorsiflexor and plantar flexor 3 LUE, 4/5 LLE  Musculoskeletal: Full range of motion in all 4 extremities. No joint swelling  Skin:    General: Skin is warm and dry.  Psychiatric:     Comments: Flat, slow to engage, but pleasant and cooperative      Assessment/Plan: 1. Functional deficits which require 3+ hours per day of interdisciplinary therapy in a comprehensive inpatient rehab setting. Physiatrist is providing close team supervision and 24 hour management of active medical problems listed below. Physiatrist and rehab team continue to assess barriers to discharge/monitor patient progress toward functional and medical goals  Care Tool:  Bathing    Body parts bathed by patient: Chest, Abdomen, Buttocks, Front perineal area, Right upper leg, Left upper leg, Face, Left arm   Body parts bathed by helper: Right arm, Right lower leg, Left lower leg     Bathing assist Assist Level: Moderate Assistance - Patient 50 - 74%     Upper Body Dressing/Undressing Upper body dressing   What is the patient wearing?: Pull over shirt    Upper body assist Assist Level:  Moderate Assistance - Patient 50 - 74%    Lower Body Dressing/Undressing Lower body dressing      What is the patient wearing?: Incontinence brief, Pants     Lower body assist Assist for lower body dressing: Moderate Assistance - Patient 50 - 74%     Toileting Toileting    Toileting assist Assist for toileting: Maximal Assistance - Patient 25 - 49%     Transfers Chair/bed transfer  Transfers assist     Chair/bed transfer assist level: Contact Guard/Touching assist     Locomotion Ambulation   Ambulation assist      Assist level: Contact Guard/Touching assist Assistive device: No Device Max distance: 150'   Walk 10 feet activity   Assist     Assist level: Contact Guard/Touching assist Assistive device: No Device   Walk 50 feet activity   Assist     Assist level: Contact Guard/Touching assist Assistive device: No Device    Walk 150 feet activity   Assist    Assist level: Minimal Assistance - Patient > 75% Assistive device: No Device    Walk 10 feet on uneven surface  activity   Assist Walk 10 feet on uneven surfaces activity did not occur: Safety/medical concerns         Wheelchair     Assist Is the patient using a wheelchair?: Yes Type of Wheelchair: Manual    Wheelchair assist level: Dependent - Patient 0%      Wheelchair 50 feet with 2 turns activity    Assist        Assist Level: Dependent - Patient 0%   Wheelchair 150 feet activity     Assist      Assist Level: Dependent - Patient 0%   Blood pressure (!) 149/75, pulse 92, temperature 98.1 F (36.7 C), resp. rate 17, height 5\' 10"  (1.778 m), weight 96.6 kg, SpO2 97%.  Medical Problem List and Plan: 1. 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  Antithrombotics: -DVT/anticoagulation:  Pharmaceutical: Lovenox  40mg  daily             -antiplatelet therapy: Aspirin  81 mg daily and Brilinta  90 mg twice daily 3. Pain Management: Tylenol  as needed 4. Mood/Behavior/Sleep: Provide emotional support  -may be experiencing some early depression. Told me he regrets not taking care of himself earlier in his life             -monitor for now. Consider SSRI             -neuropsych eval when provider is available             -antipsychotic agents: N/A 5. Neuropsych/cognition: This patient is capable of making decisions on his own behalf. 6. Skin/Wound Care: Routine skin checks 7. Fluids/Electrolytes/Nutrition: Routine in and outs with follow-up chemistries on Monday    Latest Ref Rng & Units 12/14/2023     5:05 AM 12/11/2023    7:05 AM 12/09/2023    6:17 AM  BMP  Glucose 70 - 99 mg/dL 161  096  045   BUN 8 - 23 mg/dL 29  28  31    Creatinine 0.61 -  1.24 mg/dL 2.67  1.24  5.80   Sodium 135 - 145 mmol/L 134  133  136   Potassium 3.5 - 5.1 mmol/L 3.8  4.0  4.1   Chloride 98 - 111 mmol/L 101  102  103   CO2 22 - 32 mmol/L 25  21  22    Calcium  8.9 - 10.3 mg/dL 8.6  8.5  8.4    BMET stable 1/13 8.  Dysphagia.  Dysphagia #2 nectar thick liquids.   -Continue nasogastric tube for nutritional support over weekend. Consider reducing rate of Osmolite to 40cc/hr to help stimulate PO intake.    -Advance diet per speech therapy -12/12/23 leave rate for now, no documentation of intake and pt just arrived; monitor; CBGs up but probably from TF/steroids? No dx of DM mentioned. Continue SSI -12/13/23 pt feels full and thus isn't eating as much (only two meals documented in flowsheet, 10% and 0% eaten); agreed that we'll decrease the rate to 80ml/hr but will need to try to eat more of his trays. CBGs ok.  Intake still poor 0-40% documented on 1/12 and 1/13 Ate 100% supper, 0% breakfast- suspect poor am appetite due to TF still running , will reduce to , run 6p-6a CBG (last 3)  Recent Labs    12/15/23 1825 12/15/23 2102 12/16/23 0621  GLUCAP 173* 106* 150*   CBG generally in desired range  9.  Hyperlipidemia.  Lipitor 20mg  daily 10.  GERD.  Nexium  20mg  daily 11.  Hypothyroidism.  Synthroid  50mcg daily 12.  COPD exacerbation/respiratory distress.  Patient completed 5-day course of Unasyn .  Prednisone  taper.   -pt on room air currently. 13. HTN?: unclear if this is a new diagnosis? No mention in notes. BPs elevated overnight. Monitor for now -1/15 controlled yesterday , mild elevation this am , monitor , no BP meds   Vitals:   12/12/23 1430 12/12/23 2227 12/13/23 0424 12/13/23 1431  BP: (!) 148/70 117/65 (!) 144/78 129/61   12/13/23 1941 12/14/23 0322 12/14/23 1400 12/14/23 2015  BP: 128/67 (!) 122/91  130/67 137/82   12/15/23 0453 12/15/23 1321 12/15/23 1749 12/16/23 0303  BP: 138/72 120/75 139/77 (!) 149/75    14. Bowel regimen: continue colace 100mg  daily.   Small type 6 stool recorded on 1/11 and 1/12, check KUB Type 7 stool this am , will d/c colace if pt has multiple stools   LOS: 5 days A FACE TO FACE EVALUATION WAS PERFORMED  Genetta Kenning 12/16/2023, 7:58 AM

## 2023-12-16 NOTE — Progress Notes (Signed)
 Physical Therapy Session Note  Patient Details  Name: Ian Wong MRN: [id number] Date of Birth: 07/27/1961  Today's Date: 12/16/2023 PT Individual Time: 0807-0905 PT Individual Time Calculation (min): 58 min   Short Term Goals: Week 1:  PT Short Term Goal 1 (Week 1): Pt will complete bed mobility with supervision PT Short Term Goal 2 (Week 1): Pt will complete transfers CGA with LRAD PT Short Term Goal 3 (Week 1): Pt will ambulate >150' with CGA and LRAD PT Short Term Goal 4 (Week 1): Pt will complete gait up/down ramp with CGA  Skilled Therapeutic Interventions/Progress Updates: Patient supine in bed with nsg present on entrance to room. Patient alert and agreeable to PT session.   Patient reported no pain during session. Pt stated that he feels like therapy has been going well since admission.   Therapeutic Activity: Bed Mobility: Pt performed supine<sit on EOB supervision and use of bed features. Transfers: Pt performed sit<>stand transfers throughout session with no AD and with supervision for safety.  Pt required use of toilet for BM at beginning of session. Pt ambulated from EOB to bathroom with CGA for safety. Pt with closed door supervision for privacy. Pt had small BM and void of bladder. Dependent clean of posterior peri-area for time management. Pt ambulated to sink to wash hands with increased time required to wash L UE. Pt sat to EOB to change personal pants and adjust brief (personal pants were too big). ModA provided with VC for pt to donn pants after PTA adjusted up to mid B thighs.  Gait Training:  Pt ambulated from room<main gym (roughly 120'), main gym<ortho gym (roughly 120') , and ortho gym<room using no AD with minA. Pt demonstrated the following gait deviations with therapist providing the described cuing and facilitation for improvement:  - L lean with VC to weight shift to center - Decreased step clearance on L LE with VC to increase (pt with noted increase  in clearance following NMRE intervention without VC required) - Decreased reciprocal arm swing (pt recalls cues previously stated in therapy to allow for L LE to swing accordingly) - Decreased attention to L (R gaze preference) with max cues throughout to avoid bumping into objects.  - Pt ascended/descended ramp in ortho gym with minA and VC to correct L lean  Neuromuscular Re-ed: - Step to 6" step with 8lb weight donned on L LE. Pt with R UE support at first on railing (light minA/CGA), then cued to use L UE support on railing (minA) with VC to center weight per L lean. Pt required VC to increase hip flexion on step when stepping back vs sliding on step. Pt performed until close to fatigue (when inability to clear step occurs) then pt cued to bring L knee to target (PTA hand) without stepping L LE off of step to increase L hip flexor strength for clearance. Pt performed 2nd round with same sequence/cues and level of assistance (+ max cues to center weight per increase in L lean) and with 5lb ankle weight donned.  NMR performed for improvements in motor control and coordination, balance, sequencing, judgement, and self confidence/ efficacy in performing all aspects of mobility at highest level of independence.   Patient sitting EOB at end of session with brakes locked, bed alarm set, and all needs within reach.      Therapy Documentation Precautions:  Precautions Precautions: Fall Precaution Comments: L inattention, coretrak Restrictions Weight Bearing Restrictions Per Provider Order: No  Therapy/Group: Individual Therapy  Jacey Pelc PTA 12/16/2023, 12:46 PM

## 2023-12-16 NOTE — Plan of Care (Signed)
  Problem: Consults Goal: RH STROKE PATIENT EDUCATION Description: See Patient Education module for education specifics  Outcome: Progressing   Problem: RH BOWEL ELIMINATION Goal: RH STG MANAGE BOWEL WITH ASSISTANCE Description: STG Manage Bowel with toileting Assistance. Outcome: Progressing Goal: RH STG MANAGE BOWEL W/MEDICATION W/ASSISTANCE Description: STG Manage Bowel with Medication with mod I Assistance. Outcome: Progressing   Problem: RH SAFETY Goal: RH STG ADHERE TO SAFETY PRECAUTIONS W/ASSISTANCE/DEVICE Description: STG Adhere to Safety Precautions With cues Assistance/Device. Outcome: Progressing   Problem: RH PAIN MANAGEMENT Goal: RH STG PAIN MANAGED AT OR BELOW PT'S PAIN GOAL Outcome: Progressing   Problem: RH KNOWLEDGE DEFICIT Goal: RH STG INCREASE KNOWLEDGE OF HYPERTENSION Description: Patient and family will be able to manage HTN using educational resources for medications and diet modification independently Outcome: Progressing Goal: RH STG INCREASE KNOWLEDGE OF DYSPHAGIA/FLUID INTAKE Description: Patient and family will be able to manage dysphagia using educational resources for medications and diet modification independently Outcome: Progressing Goal: RH STG INCREASE KNOWLEGDE OF HYPERLIPIDEMIA Description: Patient and family will be able to manage HLD using educational resources for medications and diet modification independently Outcome: Progressing Goal: RH STG INCREASE KNOWLEDGE OF STROKE PROPHYLAXIS Description: Patient and family will be able to manage secondary risks using educational resources for medications and diet modification independently Outcome: Progressing

## 2023-12-16 NOTE — Patient Care Conference (Signed)
Inpatient RehabilitationTeam Conference and Plan of Care Update Date: 12/16/2023   Time: 1012    Patient Name: Ian Wong      Medical Record Number: [id number]  Date of Birth: 10-Aug-1961 Sex: Male         Room/Bed: 4M12C/4M12C-01 Payor Info: Payor: [place] MEDICAID PREPAID HEALTH PLAN / Plan: [place] MEDICAID [employer] / Product Type: *No Product type* /    Admit Date/Time:  12/11/2023  2:45 PM  Primary Diagnosis:  Right middle cerebral artery stroke [facility])  Hospital Problems: Principal Problem:   Right middle cerebral artery stroke [facility]) Active Problems:   Dysphagia, post-stroke   Spastic hemiparesis 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 [facility])    Expected Discharge Date: Expected Discharge Date: 12/31/23  Team Members Present: Physician leading conference: Dr. Claudette Laws Social Worker Present: Cecile Sheerer, LCSWA Nurse Present: Chana Bode, RN;Karen Trilby Drummer, RN PT Present: Ralph Leyden, PT OT Present: Primitivo Gauze, OT SLP Present: Everardo Pacific, SLP PPS Coordinator present : Edson Snowball, PT     Current Status/Progress Goal Weekly Team Focus  Bowel/Bladder   maintain B/B continence            Swallow/Nutrition/ Hydration   D1/NTL   mod i  pharyngeal strengthening exercises, use of strategies, reducing buccal pocketing    ADL's   mod A bathing, dressing and toileting, min A ADL transfers. Has limited LUE use due to hemiplegia, developing hypertone   supervision   ADL training, LUE NMR, L side awareness, balance, pt education    Mobility   Bed mobility = supervision, sit<>stand = supervision, stand pivot = CGA/ MinA, ambulation = no AD with CGA/ MinA   overall supervision  Barriers: L inattn, decreased spinal rotation, L hemibody awareness UE>LE /// Work on: L hemibody NMR, standing balance, L sided strengthening, reducing LOA in mobility, family educ    Communication   70-80% intelligible  at conversational level   mod i   education on speech strategies    Safety/Cognition/ Behavioral Observations  minA   supervision   memory strategies, attention to L, mildly complex problem solving    Pain      N/a          Skin   Maintain skin integrity             Discharge Planning:  Pt will d/c to home with his wife who will be his primary caregiver. pt needs to be Mod I to supervision as his wife cares for their disabled son. SW will submit PCS referral to his insurance. SW will confirm there are no barriers to discharge.   Team Discussion: Patient was admitted post right MCA CVA with slow processing, delayed responses, left inattention; internally distracted, apraxia and flexor tone. Patient maintained on D1 nectar with nocturnal tube feeds. Patient on target to meet rehab goals: yes, currently CGA with toileting; able to ambulate up to 150 ft with CGA. Goals for discharge set for supervision overall.  *See Care Plan and progress notes for long and short-term goals.   Revisions to Treatment Plan:  Upgraded diet to D2 thin. Teaching Needs: Safety, medications, transfer , diet modifications , toileting, etc.  Current Barriers to Discharge: Decreased caregiver support  Possible Resolutions to Barriers: Family education Home health follow up     Medical Summary Current Status: poor intake , still has supplemental TF, BP with lability, slowed cognition  Barriers to Discharge: Uncontrolled Hypertension;Inadequate Nutritional Intake   Possible  Resolutions to [employer]: wean TF, adjust feeding schedule , enc po fluids   Continued Need for Acute Rehabilitation Level of Care: The patient requires daily medical management by a physician with specialized training in physical medicine and rehabilitation for the following reasons: Direction of a multidisciplinary physical rehabilitation program to maximize functional independence : Yes Medical management of  patient stability for increased activity during participation in an intensive rehabilitation regime.: Yes Analysis of laboratory values and/or radiology reports with any subsequent need for medication adjustment and/or medical intervention. : Yes   I attest that I was present, lead the team conference, and concur with the assessment and plan of the team.   Gwenyth Allegra 12/16/2023, 3:31 PM

## 2023-12-16 NOTE — Progress Notes (Signed)
 Patient ID: Ian Wong, male   DOB: 1961/06/22, 63 y.o.   MRN: [id number]  SW faxed PCS referral form to [facility] [phone]) and emailed to :[email or web address] .  [id number] spoke with pt wife to provide updates from team conference, and d/c date 1/30. SW informed on [facility] therapies recommended. Will discuss HHA preference with niece and follow-up with SW. SW informed on above PCS referral form faxed to insurance. She has asked if pt TF can be reduced. SW shared clinical note details from today, and she asked if it could be reduced further as pt has reported he is not full in the morning and she is hoping he can get the sensation of being hungry and that can only happen if Tfs are reduced more. SW shared her concern with medical team. SW shared will follow-up once there is more information.   Norval Been, MSW, LCSW Office: [phone] Cell: [phone] Fax: [phone]

## 2023-12-17 DIAGNOSIS — I10 Essential (primary) hypertension: Secondary | ICD-10-CM

## 2023-12-17 LAB — GLUCOSE, CAPILLARY
Glucose-Capillary: 175 mg/dL — ABNORMAL HIGH (ref 70–99)
Glucose-Capillary: 148 mg/dL — ABNORMAL HIGH (ref 70–99)
Glucose-Capillary: 84 mg/dL (ref 70–99)
Glucose-Capillary: 106 mg/dL — ABNORMAL HIGH (ref 70–99)

## 2023-12-17 MED ORDER — OSMOLITE 1.2 CAL PO LIQD
550.0000 mL | ORAL | Status: DC
  Administered 2023-12-17 – 2023-12-22 (×6): 550 mL
  Filled 2023-12-17 (×5): qty 1000
  Filled 2023-12-17: qty 711
  Filled 2023-12-17: qty 1000

## 2023-12-17 MED ORDER — FREE WATER
150.0000 mL | Freq: Four times a day (QID) | Status: DC
  Administered 2023-12-18 – 2023-12-23 (×22): 150 mL

## 2023-12-17 NOTE — Progress Notes (Signed)
Physical Therapy Session Note  Patient Details  Name: Ezkiel Peddie MRN: [id number] Date of Birth: [birth year]  Today's Date: 12/17/2023 PT Individual Time: 1052-1202 PT Individual Time Calculation (min): 70 min   Short Term Goals: Week 1:  PT Short Term Goal 1 (Week 1): Pt will complete bed mobility with supervision PT Short Term Goal 2 (Week 1): Pt will complete transfers CGA with LRAD PT Short Term Goal 3 (Week 1): Pt will ambulate >150' with CGA and LRAD PT Short Term Goal 4 (Week 1): Pt will complete gait up/down ramp with CGA  Skilled Therapeutic Interventions/Progress Updates: Patient sitting EOB eating remainder of breakfast on entrance to room. Patient alert and agreeable to PT session.   Patient reported no pain (pt reported to other staff that L ankle has intermittent pain on plantar surface - no current reports of pain throughout session, and MD/care team already made aware).   Therapeutic Activity: Transfers: Pt performed sit<>stand transfers throughout session with supervision.   PTA supervised pt eating breakfast. Pt reported not feeling hungry during stay. PTA gave encouragement that one factor to get Cortrak removed is to increase PO intake of nutrition. Pt sitting EOB and donned personal shoes (R shoe without assistance, and minA on L with VC to increase visual scan to ensure straps are fully in place). Pt then requested use of bathroom to void bladder. Pt ambulated short distance in room and sat on toilet with close supervision/CGA with pt mildly drifting to the L occasionally. Pt with closed door supervision for privacy. Pt ambulated short distance to was sink with same level of assistance.  Gait Training:  Pt ambulated from room<main gym using no AD with CGA. Pt required VC to increase lean to R per slight L lean presentation with added cues to increase L step clearance/length. Pt also cued to perform heel-strike on L with demonstration provided with min adherence  before requiring VC to maintain.   Neuromuscular Re-ed: NMR facilitated during session with focus on dynamic standing balance, L sided attention, proprioceptive feedback, dual-tasking with increase in cognitive load Obstacle Course  1st - Pinch pins on basket ball net with 2 of same color (3 sets) to act as "ticket" to acquire cone on step 2.  2nd - Cones on table of same sets of colors matching pinching pins  3rd - Table with matching colored discs (one of each color)  4th - Pt to stack disc on big base of cone with other big base stacked on top to balance  Pt performed sequence above all while having to navigate around bowling pins and hurdles (6") in order to increase cognitive load and distractions for increased awareness to L side with VC for pt to avoid knocking over obstacles. Pt performed intervention with CGA throughout with one moment of light minA to avoid LOB to the L with VC for pt to perform appropriate stepping strategy. Pt understood rationale to intervention, and self-reported challenging aspects of navigating obstacles vs balancing cones (PTA provided further education that balancing cones was mostly to challenge L sided inattention by adding cognitive load with sequence of events).  - Pt ambulated in hallway outside main gym and ortho gym with 5lb ankle weight donned (VC to increase R lean for added L LE clearance) on L LE. Pt also cued to perform head turns to the L when cued to scan for objects on the wall, then turn whole body. Pt required mod cues to only turn head to start with added  visual tracking (VC to increase visual track of B eyes to the L as pt tends to stop prior to full available ROM). PTA also used hand to have pt increase visual tracking stating. Pt with CGA that led into minA due to pt reports of fatigue (required seated rest break back in room at end of session).  NMR performed for improvements in motor control and coordination, balance, sequencing, judgement, and  self confidence/ efficacy in performing all aspects of mobility at highest level of independence.   Patient sitting EOB at end of session with brakes locked, nsg present, bed alarm set, and all needs within reach.      Therapy Documentation Precautions:  Precautions Precautions: Fall Precaution Comments: L inattention, coretrak Restrictions Weight Bearing Restrictions Per Provider Order: No   Therapy/Group: Individual Therapy  Nancyann Cotterman PTA 12/17/2023, 12:21 PM

## 2023-12-17 NOTE — Progress Notes (Signed)
Occupational Therapy Session Note  Patient Details  Name: Ian Wong MRN: [id number] Date of Birth: 1961-01-05  Today's Date: 12/17/2023 OT Individual Time: 6440-3474 OT Individual Time Calculation (min): 55 min    Short Term Goals: Week 1:  OT Short Term Goal 1 (Week 1): pt will demonstrate improved left side awareness to don L arm and L leg in clothing first to follow through with hemi dressing strategies OT Short Term Goal 2 (Week 1): Pt will don shirt with min A to demonstrate improved memory of dressing techniques. OT Short Term Goal 3 (Week 1): Pt will use L hand to hold wash cloth and wash R arm with 50 % A. OT Short Term Goal 4 (Week 1): pt will ambulated to toilet with CGA with LRAD.  Skilled Therapeutic Interventions/Progress Updates:  Skilled OT intervention completed with focus on ADL retraining, functional mobility, Lt visual scanning. Pt received upright in bed, agreeable to session. Sore throat reported; nurse aware and pt pre-medicated. OT offered rest breaks and repositioning throughout for pain reduction.  Pt requested to use bathroom. Transitioned EOB with supervision, donned velcro shoes with min A only for Lt side. Completed all sit > stands and ambulatory transfers with AD and CGA/supervision during session. Ambulated > toilet. Able to lower clothing with CGA for balance. Continent of urinary void only. Min A needed to donn brief due to brief coming undone on Lt side. Ambulated to sink for hand hygiene with no cues needed to apply soap to Lt hand, but did need cues to rinse the hand. Ambulated > gym.  Pt participated in the following activities in standing (with CGA/min A for Lt lateral drift with fatigue/dual tasking) to address Lt visual scanning and dynamic balance needed for independence and safety with BADL management: -Motor speed dot test- 5.36 sec, mod difficulty; results indicative of slower motor processing speed on especially on Lt side and also with  louder environments -Bell cancellation test- 8 min; 5 misses on Lt side even after cues provided for scanning Lt but missed complete Lt side of screen without cues, indicating Lt inattention -Trail Making A- 7:19 min, 0 errors however significant time for locating items on Lt side   Ambulated back to room. Sit > supine in bed with supervision. Pt remained semi upright in bed, with bed alarm on/activated, and with all needs in reach at end of session.   Therapy Documentation Precautions:  Precautions Precautions: Fall Precaution Comments: L inattention, coretrak Restrictions Weight Bearing Restrictions Per Provider Order: No    Therapy/Group: Individual Therapy  Melvyn Novas, MS, OTR/L  12/17/2023, 9:46 AM

## 2023-12-17 NOTE — Progress Notes (Addendum)
Nutrition Follow-up  DOCUMENTATION CODES:   Not applicable  INTERVENTION:  Modify tube feeding via Cortrak. : Osmolite 1.5 at 55 ml/h x 10 hours (550 ml per day) from 8PM-6AM Prosource TF20 60 ml QD 150 ml FWF Q6H daily Provides 905 kcal (45% est needs), 54 gm protein (55% est needs),  419 ml free water daily (1019 ml TF+flush-51% est needs) Continue to monitor PO intake and adjust TF rate as needed Mighty Shake (nectar thick) TID with meals    Ensure Enlive po TID between meals Calorie Count x 48 hours    NUTRITION DIAGNOSIS:   Inadequate oral intake related to dysphagia, acute illness as evidenced by meal completion < 50%, other (comment) (need for tube feeding). - still applicable  GOAL:  Patient will meet greater than or equal to 90% of their needs   MONITOR:  PO intake, Weight trends, Supplement acceptance, TF tolerance, Diet advancement, Skin, Labs  REASON FOR ASSESSMENT:  Consult Enteral/tube feeding initiation and management  ASSESSMENT:  Pt with hx of GERD and seizure disorder presented to ED with R gaze deviation and difficulty speaking. Imaging in ED suggestive of ischemic right MCA stroke.  12/30 - Intubated, TNKase and mechanical thrombectomy with right carotid stenting and angioplasty, NPO  12/31 - Extubated, NPO 1/2 - Hypertonic saline discontinued 1/3 - MBS, DYS1 diet, honey thick liquids, Cortrak placed  1/7 - MBS, DYS1 diet, nectar thick liquids, adjusted to nocturnal feeds 1/10 - DYS 2 diet, nectar thick liquids, transfer to CIR 1/11 - Swallow eval, DYS 1 diet, nectar thick liquids 1/13 - Nocturnal feeds initiated  Patient appetite improving. Per RN, patient is not hungry in the mornings d/t TF running late into the morning (8AM). Appetite showing 0% x2 days at breakfast, 20% at lunch x2 days, and 100% at dinner x2 days.   Will decrease TF to 10 hours overnight to reduce overall calorie and protein intake as well as run time to assess for improved PO  intake. Will order calorie count over the weekend to assess whether patient is consistently meeting his needs in an effort to D/C Cortrak completely.   Pt bowels are moving again and passing gas. This is desirable, as he was distended and reported no BM in multiple days at time of last RD visit. Tolerating nocturnal TF regimen.   Admit Weight: 99.5kg Current Weight: 96.6kg  Wt loss trend arrested. Trending up. Continues with generalized mild pitting edema.   Labs: Na 133-->134 CBGs 156-->137 over 72 hours A1c 5.9 (11/2023)  Meds: Senokot, levothyroxine, SSI, esomeprazole, docusate,     Diet Order:   Diet Order             DIET - DYS 1 Room service appropriate? Yes; Fluid consistency: Nectar Thick  Diet effective now             EDUCATION NEEDS:   Not appropriate for education at this time  Skin:  Skin Assessment: Skin Integrity Issues: Incisions: R thigh  Last BM:  01/15  Height:  Ht Readings from Last 1 Encounters:  12/11/23 5\' 10"  (1.778 m)   Weight:  Wt Readings from Last 1 Encounters:  12/16/23 96.6 kg    Ideal Body Weight:  75.5 kg  BMI:  Body mass index is 30.56 kg/m.  Estimated Nutritional Needs:   Kcal:  2000-2250 kcal/day  Protein:  98-113 gm/day  Fluid:  2000-2250 mL/day  Myrtie Cruise MS, RD, LDN Registered Dietitian Clinical Nutrition RD Inpatient Contact Info in Amion

## 2023-12-17 NOTE — Progress Notes (Signed)
Physical Therapy Session Note  Patient Details  Name: Ian Wong MRN: [id number] Date of Birth: 05/15/1961  Today's Date: 12/16/2023 PT Individual Time:  6578-4696  PT Individual Time Calculation (min): 43 min  Short Term Goals: Week 1:  PT Short Term Goal 1 (Week 1): Pt will complete bed mobility with supervision PT Short Term Goal 2 (Week 1): Pt will complete transfers CGA with LRAD PT Short Term Goal 3 (Week 1): Pt will ambulate >150' with CGA and LRAD PT Short Term Goal 4 (Week 1): Pt will complete gait up/down ramp with CGA   Skilled Therapeutic Interventions/Progress Updates:  Patient in bathroom with NT on entrance to room. Patient alert and agreeable to PT session. Demos ability to exit bathroom with close supervision from NT and light CGA using RW to ambulate to sink. Requires vc from therapist to bring L hand to sink in order to adequately wash both hands. Ambulates to bedside and discards RW prior to sitting to EOB. All with light CGA provided by NT.   Patient with no pain complaint at start of session.  Therapeutic Activity: Bed Mobility: Pt performed sit>supine at end of session with supervision. VC required to improve final positioning. Transfers: Pt performed sit<>stand transfers with supervision and stand pivot transfers throughout session with light CGA. Pt continues to bias to R to use RUE in assist for descent to sit. Provided vc/ tc for improving technique.  Gait Training:  Pt ambulated 90 ft x2 using no AD with light CGA. Demonstrated minimally intermittent missteps with LLE but is able to correct with good stepping strategy. Throughout, pt does demo decreased LLE step height/ length as compared to R. Provided vc/ tc for focus to LLE performance throughout.  Wheelchair Mobility:  Pt propelled wheelchair 65 feet with BUE and BLE. Required minA for improved hand placement of L hand and attempts to grasp rim of wheel. Provided vc/ tc for continued assist  throughout.  Neuromuscular Re-ed: NMR facilitated during session with focus on standing balance, motor control, proprioception, spinal rotation. Pt guided in standing balance activity with punching bag to pt's L side and therapist with boxing training mitt. Pt has boxing glove to R hand. Pt guided in need to twist body in order to turn to strike bag and then bring LUE with fist to called type of punch to reach to mitt. Pt is able to perform for x1 and x1 with improving rotation to spine and using available mobility.   During seated rest breaks, pt guided in use of LUE in finger-to-nose exercise and attempts to grasp brim of baseball cap. Pt NMR performed for improvements in motor control and coordination, balance, sequencing, judgement, and self confidence/ efficacy in performing all aspects of mobility at highest level of independence.   Patient supine in bed at end of session with brakes locked, bed alarm set, and all needs within reach.   Therapy Documentation Precautions:  Precautions Precautions: Fall Precaution Comments: L inattention, coretrak Restrictions Weight Bearing Restrictions Per Provider Order: No  Pain:  No pain related this session.   Therapy/Group: Individual Therapy  Loel Dubonnet PT, DPT, CSRS 12/17/2023, 8:03 AM

## 2023-12-17 NOTE — Progress Notes (Signed)
Speech Language Pathology Daily Session Note  Patient Details  Name: Ian Wong MRN: [id number] Date of Birth: 06-15-1961  Today's Date: 12/17/2023 SLP Individual Time: 1345-1445 SLP Individual Time Calculation (min): 60 min  Short Term Goals: Week 1: SLP Short Term Goal 1 (Week 1): Patient will utilize swallowing compenstory strategies during consumption of least restrictive diet given min multimodal A SLP Short Term Goal 2 (Week 1): Patient will increase speech intelligibility to 100% during conversation with use of strategies given min multimodal A SLP Short Term Goal 3 (Week 1): Patient will demonstrate problem solving in mildly complex situations given mod multimodal A SLP Short Term Goal 4 (Week 1): Patient will recall and utilize memory compensatory strategies given mod multimodal A SLP Short Term Goal 5 (Week 1): Patient will demonstrate awareness of cognitive and physical changes since admission given mod multimodal A  Skilled Therapeutic Interventions: Skilled therapy session focused on dysphagia and cognitive goals. SLP faciliated session by offering POs of D2 solids and nectar thick liquids via straw. Patient with mildly prolonged mastication times and mild residuals in L buccal cavity. Patient benfited from liquid wash after solids. No s/sx of aspiration observed throughout consistencies. Recommend continuation of D1/thin diet with further D2 trials and full supervision. SLP continued to target dysphagia goals through pharyngeal strengthening exercises. SLP provided supervision-min A for patient to complete x10 repetitions of [place], chin tucks against resistance and effortful swallows. SLP then targeted cognitive goals during functional, simple addition/subtraction task. Patient utilzed problem solving and sustained attention throughout this task with minA. Patient left in bed with alarm set and call bell in reach. Continue POC.   Pain Denies  Therapy/Group: Individual  Therapy  Arkin Imran M.A., CF-SLP 12/17/2023, 7:44 AM

## 2023-12-17 NOTE — Progress Notes (Signed)
PROGRESS NOTE   Subjective/Complaints:  Pt in bed. Still doesn't have much of an appetite. Reports some intermittent sharp pain in left foot. Feels fine now.   ROS: Patient denies fever, rash, sore throat, blurred vision, dizziness, nausea, vomiting, diarrhea, cough, shortness of breath or chest pain, joint or back/neck pain, headache, or mood change.   Objective:   DG Abd 1 View Result Date: 12/15/2023 CLINICAL DATA:  Abdominal distension EXAM: ABDOMEN - 1 VIEW COMPARISON:  12/04/2023 FINDINGS: Enteric tube tip over the second portion of duodenum. Nonobstructed gas pattern. Diverticular disease of the colon. IMPRESSION: Nonobstructed gas pattern. Enteric tube tip over the second portion of duodenum. Electronically Signed   By: Jasmine Pang M.D.   On: 12/15/2023 18:48   No results for input(s): "WBC", "HGB", "HCT", "PLT" in the last 72 hours.  No results for input(s): "NA", "K", "CL", "CO2", "GLUCOSE", "BUN", "CREATININE", "CALCIUM" in the last 72 hours.        Intake/Output Summary (Last 24 hours) at 12/17/2023 1019 Last data filed at 12/16/2023 1854 Gross per 24 hour  Intake 330 ml  Output --  Net 330 ml        Physical Exam: Vital Signs Blood pressure 129/71, pulse 81, temperature 97.9 F (36.6 C), resp. rate 18, height 5\' 10"  (1.778 m), weight 96.6 kg, SpO2 96%.   Constitutional: No distress . Vital signs reviewed. HEENT: NCAT, EOMI, oral membranes moist, NGT Neck: supple Cardiovascular: RRR without murmur. No JVD    Respiratory/Chest: CTA Bilaterally without wheezes or rales. Normal effort    GI/Abdomen: BS +, non-tender, non-distended Ext: no clubbing, cyanosis, or edema Psych: flat but cooperative  Skin: No evidence of breakdown, no evidence of rash Neurologic: Cranial nerves II through XII intact, motor strength is 5/5 in right  deltoid, bicep, tricep, grip, hip flexor, knee extensors, ankle dorsiflexor  and plantar flexor 3 to 3+ LUE, 4- to 4/5 LLE  Musculoskeletal: Full range of motion in all 4 extremities. No joint swelling. No pain with palpation or rom on left foot         Assessment/Plan: 1. Functional deficits which require 3+ hours per day of interdisciplinary therapy in a comprehensive inpatient rehab setting. Physiatrist is providing close team supervision and 24 hour management of active medical problems listed below. Physiatrist and rehab team continue to assess barriers to discharge/monitor patient progress toward functional and medical goals  Care Tool:  Bathing    Body parts bathed by patient: Chest, Abdomen, Buttocks, Front perineal area, Right upper leg, Left upper leg, Face, Left arm   Body parts bathed by helper: Right arm, Right lower leg, Left lower leg     Bathing assist Assist Level: Moderate Assistance - Patient 50 - 74%     Upper Body Dressing/Undressing Upper body dressing   What is the patient wearing?: Pull over shirt    Upper body assist Assist Level: Moderate Assistance - Patient 50 - 74%    Lower Body Dressing/Undressing Lower body dressing      What is the patient wearing?: Incontinence brief, Pants     Lower body assist Assist for lower body dressing: Moderate Assistance - Patient 50 -  74%     Toileting Toileting    Toileting assist Assist for toileting: Maximal Assistance - Patient 25 - 49%     Transfers Chair/bed transfer  Transfers assist     Chair/bed transfer assist level: Contact Guard/Touching assist     Locomotion Ambulation   Ambulation assist      Assist level: Contact Guard/Touching assist Assistive device: No Device Max distance: 150'   Walk 10 feet activity   Assist     Assist level: Contact Guard/Touching assist Assistive device: No Device   Walk 50 feet activity   Assist    Assist level: Contact Guard/Touching assist Assistive device: No Device    Walk 150 feet activity   Assist     Assist level: Minimal Assistance - Patient > 75% Assistive device: No Device    Walk 10 feet on uneven surface  activity   Assist Walk 10 feet on uneven surfaces activity did not occur: Safety/medical concerns         Wheelchair     Assist Is the patient using a wheelchair?: Yes Type of Wheelchair: Manual    Wheelchair assist level: Dependent - Patient 0%      Wheelchair 50 feet with 2 turns activity    Assist        Assist Level: Dependent - Patient 0%   Wheelchair 150 feet activity     Assist      Assist Level: Dependent - Patient 0%   Blood pressure 129/71, pulse 81, temperature 97.9 F (36.6 C), resp. rate 18, height 5\' 10"  (1.778 m), weight 96.6 kg, SpO2 96%.  Medical Problem List and Plan: 1. 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  Antithrombotics: -DVT/anticoagulation:  Pharmaceutical: Lovenox 40mg  daily             -antiplatelet therapy: Aspirin 81 mg daily and Brilinta 90 mg twice daily 3. Pain Management: Tylenol as needed 4. Mood/Behavior/Sleep: Provide emotional support  -may be experiencing some early depression. Told me he regrets not taking care of himself earlier in his life             -monitor for now. Consider SSRI             -neuropsych eval when provider is available             -antipsychotic agents: N/A 5. Neuropsych/cognition: This patient is capable of making decisions on his own behalf. 6. Skin/Wound Care: Routine skin checks 7. Fluids/Electrolytes/Nutrition: Routine in and outs with follow-up chemistries on Monday    Latest Ref Rng & Units 12/14/2023    5:05 AM 12/11/2023    7:05 AM 12/09/2023    6:17 AM  BMP  Glucose 70 - 99 mg/dL 161  096  045   BUN 8  - 23 mg/dL 29  28  31    Creatinine 0.61 - 1.24 mg/dL 4.09  8.11  9.14   Sodium 135 - 145 mmol/L 134  133  136   Potassium 3.5 - 5.1 mmol/L 3.8  4.0  4.1   Chloride 98 - 111 mmol/L 101  102  103  CO2 22 - 32 mmol/L 25  21  22    Calcium 8.9 - 10.3 mg/dL 8.6  8.5  8.4    BMET stable 1/13 8.  Dysphagia.  Dysphagia #2 nectar thick liquids.   -Continue nasogastric tube for nutritional support over weekend. Consider reducing rate of Osmolite to 40cc/hr to help stimulate PO intake.    -Advance diet per speech therapy -12/12/23 leave rate for now, no documentation of intake and pt just arrived; monitor; CBGs up but probably from TF/steroids? No dx of DM mentioned. Continue SSI -12/13/23 pt feels full and thus isn't eating as much (only two meals documented in flowsheet, 10% and 0% eaten); agreed that we'll decrease the rate to 79ml/hr but will need to try to eat more of his trays. CBGs ok.  Intake still poor 0-40% documented on 1/12 and 1/13 1/16 ate 0-20-100% yesterday. Will stop TF entirely except for flushes  -encouraged pt to push himself CBG (last 3)  Recent Labs    12/16/23 1634 12/16/23 2040 12/17/23 0755  GLUCAP 101* 94 175*   CBG generally in desired range --1/16 9.  Hyperlipidemia.  Lipitor 20mg  daily 10.  GERD.  Nexium 20mg  daily 11.  Hypothyroidism.  Synthroid daily 12.  COPD exacerbation/respiratory distress.  Patient completed 5-day course of Unasyn.  Prednisone taper.   -pt on room air currently. 13. HTN?: unclear if this is a new diagnosis? No mention in notes. BPs elevated overnight. Monitor for now -1/16 fair to borderline control. On no BP meds   Vitals:   12/13/23 1431 12/13/23 1941 12/14/23 0322 12/14/23 1400  BP: 129/61 128/67 (!) 122/91 130/67   12/14/23 2015 12/15/23 0453 12/15/23 1321 12/15/23 1749  BP: 137/82 138/72 120/75 139/77   12/16/23 0303 12/16/23 1454 12/16/23 1914 12/17/23 0546  BP: (!) 149/75 (!) 118/55 (!) 148/59 129/71    14. Bowel regimen:  continue colace 100mg  daily.   Small type 6 stool recorded on 1/11 and 1/12, check KUB Type 7 stool this am , will d/c colace if pt has multiple stools   1/16 last stool was liquidy at 0624 yesterday--no changes today LOS: 6 days A FACE TO FACE EVALUATION WAS PERFORMED  Ranelle Oyster 12/17/2023, 10:19 AM

## 2023-12-17 NOTE — Progress Notes (Signed)
Patient ID: Ian Wong, male   DOB: 1961/12/01, 63 y.o.   MRN: [id number]  SW faxed requested clinicals for PCS referral  to [facility] Medicaid [phone]).  Cecile Sheerer, MSW, LCSW Office: [phone] Cell: [phone] Fax: [phone]

## 2023-12-18 LAB — GLUCOSE, CAPILLARY
Glucose-Capillary: 103 mg/dL — ABNORMAL HIGH (ref 70–99)
Glucose-Capillary: 122 mg/dL — ABNORMAL HIGH (ref 70–99)
Glucose-Capillary: 113 mg/dL — ABNORMAL HIGH (ref 70–99)
Glucose-Capillary: 90 mg/dL (ref 70–99)
Glucose-Capillary: 92 mg/dL (ref 70–99)

## 2023-12-18 LAB — BASIC METABOLIC PANEL
Anion gap: 9 (ref 5–15)
BUN: 26 mg/dL — ABNORMAL HIGH (ref 8–23)
CO2: 26 mmol/L (ref 22–32)
Calcium: 8.8 mg/dL — ABNORMAL LOW (ref 8.9–10.3)
Chloride: 99 mmol/L (ref 98–111)
Creatinine, Ser: 1.07 mg/dL (ref 0.61–1.24)
GFR, Estimated: >60 mL/min (ref >60)
Glucose, Bld: 118 mg/dL — ABNORMAL HIGH (ref 70–99)
Potassium: 4.3 mmol/L (ref 3.5–5.1)
Sodium: 134 mmol/L — ABNORMAL LOW (ref 135–145)

## 2023-12-18 MED ORDER — ESCITALOPRAM OXALATE 10 MG PO TABS
5.0000 mg | ORAL_TABLET | Freq: Every day | ORAL | Status: DC
  Administered 2023-12-18 – 2023-12-30 (×13): 5 mg via ORAL
  Filled 2023-12-18 (×13): qty 1

## 2023-12-18 NOTE — Progress Notes (Signed)
Physical Therapy Session Note  Patient Details  Name: Ian Wong MRN: [id number] Date of Birth: 01/03/1961  Today's Date: 12/18/2023 PT Individual Time: 0815-0905 PT Individual Time Calculation (min): 50 min   Short Term Goals: Week 1:  PT Short Term Goal 1 (Week 1): Pt will complete bed mobility with supervision PT Short Term Goal 2 (Week 1): Pt will complete transfers CGA with LRAD PT Short Term Goal 3 (Week 1): Pt will ambulate >150' with CGA and LRAD PT Short Term Goal 4 (Week 1): Pt will complete gait up/down ramp with CGA  Skilled Therapeutic Interventions/Progress Updates: Patient sitting EOB eating breakfast with nsg supervising on entrance to room. Pt missed 10 min due to time needed to finish eating. On return to room, patient alert and agreeable to PT session.   Patient reported no pain. Pt with increase in redness in medial aspect of L eye (Covering MD arrived during session and assessed).   Therapeutic Activity: Transfers: Pt performed sit<>stand transfers throughout session with close supervision.   - Pt requested to void bladder at end of session and did so with supervision (doffed personal pants and brief, but required minA to donn due to brief readjustments). Pt then ambulated short distance to sink to wash hands with supervision.   Gait Training:  Pt ambulated from room<>main gym (roughly 120' x 2) using no AD with CGA/light minA to safely navigate hallway as pt has decreased awareness to L side, and required VC to increase head rotation to the L, along with increasing heel-strike B LE (R>L).  Neuromuscular Re-ed: NMR facilitated during session with focus on dynamic standing balance, increasing L sided attention. - Pt ambulating in hallway outside of main gym with instructions to rotate head to the L (without turning trunk/LE's), find object on the wall, then to increase L visual field by looking as far L as possible at PTA, then to turn to the L (performed  several rounds) and then ambulated around with VC to perform heel-strike (mod cues). Pt with required mod cues to increase awareness to L side ("how close are you to the wall?") with instructions to correct back towards center of hallway. - Stepping to the L with L LE towards target tape on floor in order to work on turning to the L (slightly posterior to the L). Pt with CGA/light minA and with decrease in clearance of L foot, specifically the heel. Pt then required weighted bar tapped down to act as barrier to clear with decrease in step length (shortened distance of tape). Pt then with minA and decrease in weight shift to the R, and required max cues to lean to the R with education/demonstration provided. Pt improved clearance towards last few reps.  NMR performed for improvements in motor control and coordination, balance, sequencing, judgement, and self confidence/ efficacy in performing all aspects of mobility at highest level of independence.   Patient washing hands at sink with hand off to nsg at end of session.      Therapy Documentation Precautions:  Precautions Precautions: Fall Precaution Comments: L inattention, coretrak Restrictions Weight Bearing Restrictions Per Provider Order: No  Therapy/Group: Individual Therapy  Eastin Swing A Sammy Douthitt 12/18/2023, 12:31 PM

## 2023-12-18 NOTE — Progress Notes (Signed)
Occupational Therapy Session Note  Patient Details  Name: Ian Wong MRN: [id number] Date of Birth: 08-Oct-1961  Today's Date: 12/18/2023 OT Individual Time: 4098-1191 OT Individual Time Calculation (min): 48 min    Short Term Goals: Week 1:  OT Short Term Goal 1 (Week 1): pt will demonstrate improved left side awareness to don L arm and L leg in clothing first to follow through with hemi dressing strategies OT Short Term Goal 2 (Week 1): Pt will don shirt with min A to demonstrate improved memory of dressing techniques. OT Short Term Goal 3 (Week 1): Pt will use L hand to hold wash cloth and wash R arm with 50 % A. OT Short Term Goal 4 (Week 1): pt will ambulated to toilet with CGA with LRAD.  Skilled Therapeutic Interventions/Progress Updates:  Pt greeted supine in bed, pt agreeable to OT intervention.      Transfers/bed mobility/functional mobility: supine> sit with CGA. Pt completed all functional ambulation with no AD and CGA- MINA. Pt noted to have mild listing to L side during ambulation needing MIN verbal cues to self correct.   Self care:  Pt completed ambulatory transfer to toilet with no AD and CGA.  NMR:  Pt completed various activities focused on neuromuscular to LUE:  - pt instructed to grasp wash cloths with LUE and place to target -pt completed lateral leans to LUE to reach across midline to stack cups with an emphasis on weightbearing into LUE and returning to midline -pt able to lean onto LUE to use RUE to position squigz on mirror  -pt able to stand in front of mat table with LUE weightbearing into mat with RUE reaching across midline to match cups to dots with an emphasis on weight bearing in to affect UE for neuromuscular feedback.   Exercises:  Pt completed various active assist ROM exercises to facilitate improved AROM in LUE using both unweighted dowel rod and hula hoop with a focus on chest presses, elbow flexion/extension, and shoulder horizontal abd/add.   Pt required visual feedback from mirror for body mechanics and MIN verbal cues for set- up.                 Ended session with pt seated on toilet with nurse present. Therapy Documentation Precautions:  Precautions Precautions: Fall Precaution Comments: L inattention, coretrak Restrictions Weight Bearing Restrictions Per Provider Order: No  Pain: No pain reported during session    Therapy/Group: Individual Therapy  Pollyann Glen [facility] 12/18/2023, 12:24 PM

## 2023-12-18 NOTE — Progress Notes (Addendum)
PROGRESS NOTE   Subjective/Complaints:  Pt sitting in gym with therapy. No new issues. Only ate 25% of two meals yesterday. No breakfast intake reported. Pt somewhat acknowledged that he didn't eat  ROS: Limited due to cognitive/behavioral   Objective:   No results found.  No results for input(s): "WBC", "HGB", "HCT", "PLT" in the last 72 hours.  Recent Labs    12/18/23 0737  NA 134*  K 4.3  CL 99  CO2 26  GLUCOSE 118*  BUN 26*  CREATININE 1.07  CALCIUM 8.8*          Intake/Output Summary (Last 24 hours) at 12/18/2023 1042 Last data filed at 12/18/2023 0820 Gross per 24 hour  Intake 1027.75 ml  Output --  Net 1027.75 ml        Physical Exam: Vital Signs Blood pressure 133/67, pulse 84, temperature 98.4 F (36.9 C), resp. rate 16, height 5\' 10"  (1.778 m), weight 96.6 kg, SpO2 96%.   Constitutional: No distress . Vital signs reviewed. HEENT: NCAT, EOMI, oral membranes moist, NGT Neck: supple Cardiovascular: RRR without murmur. No JVD    Respiratory/Chest: CTA Bilaterally without wheezes or rales. Normal effort    GI/Abdomen: BS +, non-tender, non-distended Ext: no clubbing, cyanosis, or edema Psych: flat but cooperative  Skin: No evidence of breakdown, no evidence of rash Neurologic: Cranial nerves II through XII intact, motor strength is 5/5 in right  deltoid, bicep, tricep, grip, hip flexor, knee extensors, ankle dorsiflexor and plantar flexor 3 to 3+ LUE, 4- to 4/5 LLE--no significant changes 1/17  Musculoskeletal: Full range of motion in all 4 extremities. No joint swelling. No pain with palpation or rom on left foot         Assessment/Plan: 1. Functional deficits which require 3+ hours per day of interdisciplinary therapy in a comprehensive inpatient rehab setting. Physiatrist is providing close team supervision and 24 hour management of active medical problems listed below. Physiatrist  and rehab team continue to assess barriers to discharge/monitor patient progress toward functional and medical goals  Care Tool:  Bathing    Body parts bathed by patient: Chest, Abdomen, Buttocks, Front perineal area, Right upper leg, Left upper leg, Face, Left arm   Body parts bathed by helper: Right arm, Right lower leg, Left lower leg     Bathing assist Assist Level: Moderate Assistance - Patient 50 - 74%     Upper Body Dressing/Undressing Upper body dressing   What is the patient wearing?: Pull over shirt    Upper body assist Assist Level: Moderate Assistance - Patient 50 - 74%    Lower Body Dressing/Undressing Lower body dressing      What is the patient wearing?: Incontinence brief, Pants     Lower body assist Assist for lower body dressing: Moderate Assistance - Patient 50 - 74%     Toileting Toileting    Toileting assist Assist for toileting: Maximal Assistance - Patient 25 - 49%     Transfers Chair/bed transfer  Transfers assist     Chair/bed transfer assist level: Contact Guard/Touching assist     Locomotion Ambulation   Ambulation assist      Assist level: Contact Guard/Touching  assist Assistive device: No Device Max distance: 150'   Walk 10 feet activity   Assist     Assist level: Contact Guard/Touching assist Assistive device: No Device   Walk 50 feet activity   Assist    Assist level: Contact Guard/Touching assist Assistive device: No Device    Walk 150 feet activity   Assist    Assist level: Minimal Assistance - Patient > 75% Assistive device: No Device    Walk 10 feet on uneven surface  activity   Assist Walk 10 feet on uneven surfaces activity did not occur: Safety/medical concerns         Wheelchair     Assist Is the patient using a wheelchair?: Yes Type of Wheelchair: Manual    Wheelchair assist level: Dependent - Patient 0%      Wheelchair 50 feet with 2 turns activity    Assist         Assist Level: Dependent - Patient 0%   Wheelchair 150 feet activity     Assist      Assist Level: Dependent - Patient 0%   Blood pressure 133/67, pulse 84, temperature 98.4 F (36.9 C), resp. rate 16, height 5\' 10"  (1.778 m), weight 96.6 kg, SpO2 96%.  Medical Problem List and Plan: 1. 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  Antithrombotics: -DVT/anticoagulation:  Pharmaceutical: Lovenox 40mg  daily             -antiplatelet therapy: Aspirin 81 mg daily and Brilinta 90 mg twice daily 3. Pain Management: Tylenol as needed 4. Mood/Behavior/Sleep: Provide emotional support  - Told me he regrets not taking care of himself earlier in his life             -1/17 I think he's depressed although certainly some of affect is d/t stroke location. Begin lexapro 5mg  qhs             -neuropsych eval when provider is available             -antipsychotic agents: N/A 5. Neuropsych/cognition: This patient is capable of making decisions on his own behalf. 6. Skin/Wound Care: Routine skin checks 7. Fluids/Electrolytes/Nutrition: Routine in and outs with follow-up chemistries on Monday    Latest Ref Rng & Units 12/18/2023    7:37 AM 12/14/2023    5:05 AM 12/11/2023    7:05 AM  BMP  Glucose 70 - 99 mg/dL 161  096  045   BUN 8 - 23 mg/dL 26  29  28    Creatinine 0.61 - 1.24 mg/dL 4.09  8.11  9.14   Sodium 135 - 145 mmol/L 134  134  133   Potassium 3.5 - 5.1 mmol/L 4.3  3.8  4.0   Chloride 98 - 111 mmol/L 99  101  102   CO2 22 - 32 mmol/L 26  25  21    Calcium 8.9 - 10.3 mg/dL 8.8  8.6  8.5    BMET improved 1/17---push fluids, continue H2O flushes thru tube 8.  Dysphagia.  Dysphagia #2 nectar thick liquids.   -Continue nasogastric  tube for nutritional support over  weekend. Consider reducing rate of Osmolite to 40cc/hr to help stimulate PO intake.    -Advance diet per speech therapy -12/12/23 leave rate for now, no documentation of intake and pt just arrived; monitor; CBGs up but probably from TF/steroids? No dx of DM mentioned. Continue SSI -12/13/23 pt feels full and thus isn't eating as much (only two meals documented in flowsheet, 10% and 0% eaten); agreed that we'll decrease the rate to 101ml/hr but will need to try to eat more of his trays. CBGs ok.  Intake still poor 0-40% documented on 1/12 and 1/13 1/17--ate 0-20-20 yesterday. Discussed the fact that he needs to eat more for [place] to remove his NGT. He expressed an understanding.   -Tube feed reduced per RD. Now 55cc/hr for 10 hours overnight  CBG (last 3)  Recent Labs    12/17/23 1628 12/17/23 2128 12/18/23 0613  GLUCAP 84 106* 103*   CBG generally in desired range --1/17 9.  Hyperlipidemia.  Lipitor 20mg  daily 10.  GERD.  Nexium 20mg  daily 11.  Hypothyroidism.  Synthroid daily 12.  COPD exacerbation/respiratory distress.  Patient completed 5-day course of Unasyn.  Prednisone taper.   -pt on room air currently. 13. HTN?: unclear if this is a new diagnosis? No mention in notes. BPs elevated overnight. Monitor for now -1/17 fair control. On no BP meds   Vitals:   12/14/23 1400 12/14/23 2015 12/15/23 0453 12/15/23 1321  BP: 130/67 137/82 138/72 120/75   12/15/23 1749 12/16/23 0303 12/16/23 1454 12/16/23 1914  BP: 139/77 (!) 149/75 (!) 118/55 (!) 148/59   12/17/23 0546 12/17/23 1513 12/17/23 1935 12/18/23 0518  BP: 129/71 (!) 141/72 130/60 133/67    14. Bowel regimen:     -last bm 1/16 , type 4. Continue colace LOS: 7 days A FACE TO FACE EVALUATION WAS PERFORMED  Ranelle Oyster 12/18/2023, 10:42 AM

## 2023-12-18 NOTE — Progress Notes (Signed)
Speech Language Pathology Weekly Progress and Session Note  Patient Details  Name: Ian Wong MRN: [id number] Date of Birth: 12/26/1960  Beginning of progress report period: December 12, 2023 End of progress report period: December 18, 2023  Today's Date: 12/18/2023 SLP Individual Time: 1430-1530 SLP Individual Time Calculation (min): 60 min  Short Term Goals: Week 1: SLP Short Term Goal 1 (Week 1): Patient will utilize swallowing compenstory strategies during consumption of least restrictive diet given min multimodal A SLP Short Term Goal 1 - Progress (Week 1): Met SLP Short Term Goal 2 (Week 1): Patient will increase speech intelligibility to 100% during conversation with use of strategies given min multimodal A SLP Short Term Goal 2 - Progress (Week 1): Met SLP Short Term Goal 3 (Week 1): Patient will demonstrate problem solving in mildly complex situations given mod multimodal A SLP Short Term Goal 3 - Progress (Week 1): Met SLP Short Term Goal 4 (Week 1): Patient will recall and utilize memory compensatory strategies given mod multimodal A SLP Short Term Goal 4 - Progress (Week 1): Not met SLP Short Term Goal 5 (Week 1): Patient will demonstrate awareness of cognitive and physical changes since admission given mod multimodal A SLP Short Term Goal 5 - Progress (Week 1): Met    New Short Term Goals: Week 2: SLP Short Term Goal 1 (Week 2): Patient will utilize swallowing compenstory strategies during consumption of least restrictive diet given supervision multimodal A SLP Short Term Goal 2 (Week 2): Patient will increase speech intelligibility to 100% during conversation with use of strategies given supervision multimodal A SLP Short Term Goal 3 (Week 2): Patient will demonstrate problem solving in mildly complex situations given min multimodal A SLP Short Term Goal 4 (Week 2): Patient will recall and utilize memory compensatory strategies given mod multimodal A SLP Short Term  Goal 5 (Week 2): Patient will demonstrate awareness of cognitive and physical changes since admission given min multimodal A SLP Short Term Goal 6 (Week 2): Patient will attend to L visual field during functional tasks with min multimodal A  Weekly Progress Updates: Pt has made great gains and has met 4 of 5 STGs this reporting period due to improved dysphagia, cognition, and speech intelligibility. Currently, patient continues to require min A to utilize swallowing and speech intelligibility strategies. Patient is currently on D1/NTL diet, however is functionally progressing orally during trials of D2. Patient continues to benefit from modA during cognitive tasks such as intellectual awareness and basic problem solving. Patient did not meet memory goal this week as despite maxA, he was unable to recall memory strategies.  Pt/family eduction ongoing. Pt would benefit from continued ST intervention to maximize cognition, swallowing safety and intelligibility in order to maximize functional independence at d/c.    Intensity: Minumum of 1-2 x/day, 30 to 90 minutes Frequency: 3 to 5 out of 7 days Duration/Length of Stay: 1/30 Treatment/Interventions: Cognitive remediation/compensation;Cueing hierarchy;Dysphagia/aspiration precaution training;Functional tasks;Internal/external aids;Patient/family education;Therapeutic Activities   Daily Session  Skilled Therapeutic Interventions:  Skilled therapy session focused on dysphagia, cognitive and speech intelligibility goals. At the beginning of the session, SLP educated patient on speech intelligibility strategies (SLOP: slow, loud, over articulate, pause) and encouraged use. Patient utilized strategies with minA during structured conversation task. Patient 100% intelligible throughout session. SLP targeted cognitive goals through intellectual awareness task. Given minA, patient recalled cognitive and physical changes since admission including slower  processing, swallowing deficits, and physical changes on L. Patient required modA to note L  inattention. At the end of the session, patient reporting worries regarding PO consumption and meeting nutritional needs. Patient reports not enjoying hospital food and requesting soup. SLP offered patient NTL chicken broth with softened crackers (D2) . Patient with timely initiation of swallow, significantly reduced L buccal residuals and no s/sx of aspiration. Recommend current diet and plan for D2 trial tray as schedule allows. Patient left in bed with alarm set and call bell in reach. Continue POC.     Pain None reported   Therapy/Group: Individual Therapy  Yakub Lodes M.A., CF-SLP 12/18/2023, 3:38 PM

## 2023-12-19 LAB — GLUCOSE, CAPILLARY
Glucose-Capillary: 123 mg/dL — ABNORMAL HIGH (ref 70–99)
Glucose-Capillary: 123 mg/dL — ABNORMAL HIGH (ref 70–99)
Glucose-Capillary: 97 mg/dL (ref 70–99)
Glucose-Capillary: 107 mg/dL — ABNORMAL HIGH (ref 70–99)

## 2023-12-19 NOTE — Progress Notes (Signed)
Physical Therapy Session Note  Patient Details  Name: Ian Wong MRN: [id number] Date of Birth: 02/12/1961  Today's Date: 12/18/2023 PT Individual Time:  1119-1203  PT Individual Time Calculation (min): 44 min  Short Term Goals: Week 1:  PT Short Term Goal 1 (Week 1): Pt will complete bed mobility with supervision PT Short Term Goal 2 (Week 1): Pt will complete transfers CGA with LRAD PT Short Term Goal 3 (Week 1): Pt will ambulate >150' with CGA and LRAD PT Short Term Goal 4 (Week 1): Pt will complete gait up/down ramp with CGA   Skilled Therapeutic Interventions/Progress Updates:  Patient supine in bed on entrance to room. Patient alert and agreeable to PT session.   Patient with no pain complaint at start of session.  Therapeutic Activity: Bed Mobility: Pt performed supine > sit with supervision. No cueing required for technique. Shoes donned while seated EOB with MaxA for time.  Transfers: Pt performed sit<>stand from EOB with supervision. Relates need to toilet. Ambulatory transfer to toilet with no AD and close supervision/ light CGA. Min/ ModA for clothing mgmt. Pt only using RUE. VC and education provided to attempt to use LUE. Demonstrated to pt use of entire hand just under waistband, then performing wrist extension to "hook" into waistband and pull up on L side d/t weak key and pinch grasp. Throughout session, pt performs  stand pivot transfers with close supervision/ CGA. Provided vc/ tc for increasing step length with LLE.  Gait Training:  Pt ambulated >300 ft using no AD with close supervision/ CGA. Cued to increase step length with heel-to-progression. Pt requested to stop at different locations in hallway and requested to locate an object to his left and relate name of object to therapist. Requires max vc for pt to turn head to L and locate objects. First instance even requiring visual cue.   Next to water fountain, pt requests drink of cold water from fountain. As  pt continues to require nectar thick liquids, pt brought back to nurses' station and provided with pre-mixed cold water with lemon flavor. Supervised with water and pt able to maintain small sips with good swallow and clearing of mouth after x5.  Patient seated upright on EOB at end of session with brakes locked, bed alarm set, and all needs within reach.   Therapy Documentation Precautions:  Precautions Precautions: Fall Precaution Comments: L inattention, coretrak Restrictions Weight Bearing Restrictions Per Provider Order: No  Pain: Pain Assessment Pain Scale: 0-10 Pain Score: 0-No pain  Therapy/Group: Individual Therapy  Loel Dubonnet PT, DPT, CSRS 12/18/2023, 4:33 PM

## 2023-12-19 NOTE — Progress Notes (Signed)
Physical Therapy Weekly Progress Note  Patient Details  Name: Ian Wong MRN: [id number] Date of Birth: 1961-05-05  Beginning of progress report period: December 12, 2023 End of progress report period: December 19, 2023  Today's Date: 12/19/2023 PT Individual Time: 1102-1200 PT Individual Time Calculation (min): 58 min  and Today's Date: 12/19/2023 PT Missed Time: 17 Minutes Missed Time Reason: Patient fatigue  Patient has met 4 of 4 short term goals.  Pt making appropriate progress towards goals and is on track to meet LTG. He completes bed mobility with supervision, sit<>stand transfers with supervision, and stand<>pivot transfers with supervision/ CGA  - all with no AD. He's ambulating up to almost 300 ft  with supervision/ CGA and no AD. He has also shown ability to navigate four 6-in steps with CGA and 2 hand rails. He continues to be primarily limited by visual deficits, decreased dynamic standing balance, LUE weakness, L side inattention, and minimal gait impairments. No family has yet participated in family education to prepare for d/c home. Plan for later this coming week.    Patient continues to demonstrate the following deficits muscle weakness, decreased cardiorespiratoy endurance, impaired timing and sequencing, unbalanced muscle activation, decreased coordination, and decreased motor planning, decreased attention to left, decreased awareness, decreased problem solving, decreased safety awareness, and delayed processing, and decreased standing balance, decreased balance strategies, and hemipareisis  and therefore will continue to benefit from skilled PT intervention to increase functional independence with mobility.  Patient progressing toward long term goals..  Continue plan of care.  PT Short Term Goals Week 1:  PT Short Term Goal 1 (Week 1): Pt will complete bed mobility with supervision PT Short Term Goal 1 - Progress (Week 1): Met PT Short Term Goal 2 (Week 1): Pt will  complete transfers CGA with LRAD PT Short Term Goal 2 - Progress (Week 1): Met PT Short Term Goal 3 (Week 1): Pt will ambulate >150' with CGA and LRAD PT Short Term Goal 3 - Progress (Week 1): Met PT Short Term Goal 4 (Week 1): Pt will complete gait up/down ramp with CGA PT Short Term Goal 4 - Progress (Week 1): Met Week 2:  PT Short Term Goal 1 (Week 2): Pt will complete transfers with consistent supervision and LRAD PT Short Term Goal 2 (Week 2): Pt will ambulate >300' with supervision and LRAD. PT Short Term Goal 3 (Week 2): Pt will improve score of Berg Balance Scale by at least 7 points in order to demonstrate progress. PT Short Term Goal 4 (Week 2): Pt will ambulate ramp with overall supervision.  Skilled Therapeutic Interventions/Progress Updates:  Patient supine in bed on entrance to room. On initial entrance to room, pt asleep and difficult to rouse. Pt allowed to sleep and returned in to reattempt.   Patient then awake, alert, and agreeable to PT session.   Patient with no pain complaint at start of session.  Therapeutic Activity: Bed Mobility: Pt performed supine > sit with supervision. Shoes donned with MaxA for time.  Transfers: NT arrives to assist pt to toilet. Assumed care from NT. Pt performed sit<>stand and stand pivot transfers including toilet transfer with close supervision. Clothing mgmt performed with ModA. Pt ambulates from bathroom to sink and more appropriately uses Bil hands to assist with complete hand washing and then drying. Pt has initiated hand washing with RUE only in past.   Gait Training:  Pt ambulated >200 ft x2 this session using no AD with close supervision and  one instance of CGA/ MinA for misstep. Pt is able to self correct but add'l support provided to prevent fall.  Demonstrated learning by providing verbal identification for need of increased L step length and improving heel strike during ambulation. During second bout, pt requested to either  stop ambulation with positioning of object/ item on wall to pt's L side OR to stop ambulation and then finding object to L side. Provided vc/ tc for initial performance of L head turn/ trunk rotation.   Neuromuscular Re-ed: NMR facilitated during session with focus onstanding balance, visual scanning, trunk rotation. Pt guided in standing activity requiring turn of trunk in order to use ipsilateral UE to reach and touch called contralateral color of post-it note. 3 colors placed to each side of pt at head height, mid chest height and below waist height. Pt requires vc initially for using correct UE but is able to demo learning and no vc by end of activity. Good spinal rotation performed by mid bouts. Second bout with L forefinger extension but can only perform with first and middle fingers extended together. Cued for maintaining toes pointed forward throughout.   NMR performed for improvements in motor control and coordination, balance, sequencing, judgement, and self confidence/ efficacy in performing all aspects of mobility at highest level of independence.   Patient seated at EOB at end of session with brakes locked, bed alarm set, and all needs within reach.   Therapy Documentation Precautions:  Precautions Precautions: Fall Precaution Comments: L inattention, cortrak Restrictions Weight Bearing Restrictions Per Provider Order: No  Pain: Pain Assessment Pain Scale: 0-10 Pain Score: 0-No pain  Therapy/Group: Individual Therapy  Loel Dubonnet PT, DPT, CSRS 12/19/2023, 4:39 PM

## 2023-12-19 NOTE — Progress Notes (Signed)
Occupational Therapy Weekly Progress Note  Patient Details  Name: Ian Wong MRN: [id number] Date of Birth: 1961-09-25  Beginning of progress report period: December 12, 2023 End of progress report period: December 19, 2023  Today's Date: 12/19/2023  Patient has met 4 of 4 short term goals.  Pt making steady progress towards OT goals with pt currently requiring MIN A for 3/3 toileting tasks, CGA- MIN A for ambulatory ADL transfers, MIN A for UB and LB ADLs. 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 Continue POC.   Patient continues to demonstrate the following deficits: {impairments:3041632} and therefore will continue to benefit from skilled OT intervention to enhance overall performance with {ADL/iADL:3041649}.  Patient {LTG progression:3041653}.  {plan of WUXL:2440102}  OT Short Term Goals Week 1:  OT Short Term Goal 1 (Week 1): pt will demonstrate improved left side awareness to don L arm and L leg in clothing first to follow through with hemi dressing strategies OT Short Term Goal 1 - Progress (Week 1): Met OT Short Term Goal 2 (Week 1): Pt will don shirt with min A to demonstrate improved memory of dressing techniques. OT Short Term Goal 2 - Progress (Week 1): Met OT Short Term Goal 3 (Week 1): Pt will use L hand to hold wash cloth and wash R arm with 50 % A. OT Short Term Goal 3 - Progress (Week 1): Met OT Short Term Goal 4 (Week 1): pt will ambulated to toilet with CGA with LRAD. OT Short Term Goal 4 - Progress (Week 1): Met Week 2:  OT Short Term Goal 1 (Week 2): pt will complete 1/3 toileting tasks with CGA OT Short Term Goal 2 (Week 2): pt will complete 2 grooming tasks at sink with CGA demonstrating improved activity tolerance for higher level ADLs OT Short Term Goal 3 (Week 2): pt will incorporate LUE into grooming tasks with < 3 verbal cues   Therapy Documentation Precautions:  Precautions Precautions: Fall Precaution Comments: L inattention,  coretrak Restrictions Weight Bearing Restrictions Per Provider Order: No   Therapy/Group: Individual Therapy  Ian Wong 12/19/2023, 12:51 PM

## 2023-12-19 NOTE — Progress Notes (Signed)
PROGRESS NOTE   Subjective/Complaints:  Pt doing ok, slept well, denies pain, LBM last night, urinating fine per pt, denies any other complaints or concerns today. Will try to eat more, likes frosted flakes and milk, asked nursing for more of that.   ROS: Limited due to cognitive/behavioral, but denied CP, SOB, abd pain, n/v/d/c  Objective:   No results found.  No results for input(s): "WBC", "HGB", "HCT", "PLT" in the last 72 hours.  Recent Labs    12/18/23 0737  NA 134*  K 4.3  CL 99  CO2 26  GLUCOSE 118*  BUN 26*  CREATININE 1.07  CALCIUM 8.8*          Intake/Output Summary (Last 24 hours) at 12/19/2023 1029 Last data filed at 12/19/2023 0838 Gross per 24 hour  Intake 420 ml  Output --  Net 420 ml        Physical Exam: Vital Signs Blood pressure 131/70, pulse 81, temperature 98.7 F (37.1 C), resp. rate 17, height 5\' 10"  (1.778 m), weight 96.6 kg, SpO2 98%.   Constitutional: No distress . Vital signs reviewed. Laying in bed.  HEENT: NCAT, EOMI, oral membranes moist, NGT Neck: supple Cardiovascular: RRR without murmur. No JVD    Respiratory/Chest: CTA Bilaterally without wheezes or rales. Normal effort    GI/Abdomen: BS +, non-tender, non-distended Ext: no clubbing, cyanosis, or edema Psych: flat but cooperative  Skin: No evidence of breakdown, no evidence of rash over exposed surfaces.   PRIOR EXAMS: Neurologic: Cranial nerves II through XII intact, motor strength is 5/5 in right  deltoid, bicep, tricep, grip, hip flexor, knee extensors, ankle dorsiflexor and plantar flexor 3 to 3+ LUE, 4- to 4/5 LLE--no significant changes 1/17  Musculoskeletal: Full range of motion in all 4 extremities. No joint swelling. No pain with palpation or rom on left foot         Assessment/Plan: 1. Functional deficits which require 3+ hours per day of interdisciplinary therapy in a comprehensive inpatient rehab  setting. Physiatrist is providing close team supervision and 24 hour management of active medical problems listed below. Physiatrist and rehab team continue to assess barriers to discharge/monitor patient progress toward functional and medical goals  Care Tool:  Bathing    Body parts bathed by patient: Chest, Abdomen, Buttocks, Front perineal area, Right upper leg, Left upper leg, Face, Left arm   Body parts bathed by helper: Right arm, Right lower leg, Left lower leg     Bathing assist Assist Level: Moderate Assistance - Patient 50 - 74%     Upper Body Dressing/Undressing Upper body dressing   What is the patient wearing?: Pull over shirt    Upper body assist Assist Level: Moderate Assistance - Patient 50 - 74%    Lower Body Dressing/Undressing Lower body dressing      What is the patient wearing?: Incontinence brief, Pants     Lower body assist Assist for lower body dressing: Moderate Assistance - Patient 50 - 74%     Toileting Toileting    Toileting assist Assist for toileting: Maximal Assistance - Patient 25 - 49%     Transfers Chair/bed transfer  Transfers assist  Chair/bed transfer assist level: Contact Guard/Touching assist     Locomotion Ambulation   Ambulation assist      Assist level: Contact Guard/Touching assist Assistive device: No Device Max distance: 150'   Walk 10 feet activity   Assist     Assist level: Contact Guard/Touching assist Assistive device: No Device   Walk 50 feet activity   Assist    Assist level: Contact Guard/Touching assist Assistive device: No Device    Walk 150 feet activity   Assist    Assist level: Minimal Assistance - Patient > 75% Assistive device: No Device    Walk 10 feet on uneven surface  activity   Assist Walk 10 feet on uneven surfaces activity did not occur: Safety/medical concerns         Wheelchair     Assist Is the patient using a wheelchair?: Yes Type of  Wheelchair: Manual    Wheelchair assist level: Dependent - Patient 0%      Wheelchair 50 feet with 2 turns activity    Assist        Assist Level: Dependent - Patient 0%   Wheelchair 150 feet activity     Assist      Assist Level: Dependent - Patient 0%   Blood pressure 131/70, pulse 81, temperature 98.7 F (37.1 C), resp. rate 17, height 5\' 10"  (1.778 m), weight 96.6 kg, SpO2 98%.  Medical Problem List and Plan: 1. 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  Antithrombotics: -DVT/anticoagulation:  Pharmaceutical: Lovenox 40mg  daily             -antiplatelet therapy: Aspirin 81 mg daily and Brilinta 90 mg twice daily 3. Pain Management: Tylenol as needed 4. Mood/Behavior/Sleep: Provide emotional support  - Told me he regrets not taking care of himself earlier in his life -1/17 I think he's depressed although certainly some of affect is d/t stroke location. Begin lexapro 5mg  qhs             -neuropsych eval when provider is available             -antipsychotic agents: N/A 5. Neuropsych/cognition: This patient is capable of making decisions on his own behalf. 6. Skin/Wound Care: Routine skin checks 7. Fluids/Electrolytes/Nutrition: Routine in and outs with follow-up chemistries on Monday   BMET improved 1/17---push fluids, continue H2O flushes thru tube    Latest Ref Rng & Units 12/18/2023    7:37 AM 12/14/2023    5:05 AM 12/11/2023    7:05 AM  BMP  Glucose 70 - 99 mg/dL 914  782  956   BUN 8 - 23 mg/dL 26  29  28    Creatinine 0.61 - 1.24 mg/dL 2.13  0.86  5.78   Sodium 135 - 145 mmol/L 134  134  133   Potassium 3.5 - 5.1 mmol/L 4.3  3.8  4.0   Chloride 98 - 111 mmol/L 99  101  102    CO2 22 - 32 mmol/L 26  25  21    Calcium 8.9 - 10.3 mg/dL 8.8  8.6  8.5  8.  Dysphagia.  Dysphagia #2 nectar thick liquids.   -Continue nasogastric tube for nutritional support over weekend. Consider reducing rate of Osmolite to 40cc/hr to help stimulate PO intake.    -Advance diet per speech therapy -12/12/23 leave rate for now, no documentation of intake and pt just arrived; monitor; CBGs up but probably from TF/steroids? No dx of DM mentioned. Continue SSI -12/13/23 pt feels full and thus isn't eating as much (only two meals documented in flowsheet, 10% and 0% eaten); agreed that we'll decrease the rate to 96ml/hr but will need to try to eat more of his trays. CBGs ok.  Intake still poor 0-40% documented on 1/12 and 1/13 1/17--ate 0-20-20 yesterday. Discussed the fact that he needs to eat more for [place] to remove his NGT. He expressed an understanding.   -Tube feed reduced per RD. Now 55cc/hr for 10 hours overnight  CBG generally in desired range --1/17 -12/19/23 ate 25-50% of meals yesterday but 0% this AM-- asking for more frosted flakes and milk, encouraged to eat; of note, prednisone completed, could be contributing to less appetite but pt will try to eat more; CBGs fine  CBG (last 3)  Recent Labs    12/18/23 2103 12/18/23 2106 12/19/23 0628  GLUCAP 90 92 123*   9.  Hyperlipidemia.  Lipitor 20mg  daily 10.  GERD.  Nexium 20mg  daily 11.  Hypothyroidism.  Synthroid daily 12.  COPD exacerbation/respiratory distress.  Patient completed 5-day course of Unasyn.  Prednisone taper completed.   -pt on room air currently. 13. HTN?: unclear if this is a new diagnosis? No mention in notes. BPs elevated overnight. Monitor for now -1/17 fair control. On no BP meds   -12/19/23 BPs fine, cont monitoring Vitals:   12/15/23 1749 12/16/23 0303 12/16/23 1454 12/16/23 1914  BP: 139/77 (!) 149/75 (!) 118/55 (!) 148/59   12/17/23 0546 12/17/23 1513 12/17/23 1935 12/18/23 0518  BP: 129/71 (!)  141/72 130/60 133/67   12/18/23 1420 12/18/23 1856 12/18/23 2003 12/19/23 0502  BP: 126/69 124/68 132/79 131/70    14. Bowel regimen:    -last bm 1/16 , type 4. Continue colace  -12/19/23 LBM last night, cont regimen LOS: 8 days A FACE TO FACE EVALUATION WAS PERFORMED  [address] 12/19/2023, 10:29 AM

## 2023-12-19 NOTE — Progress Notes (Signed)
Speech Language Pathology Daily Session Note  Patient Details  Name: Ian Wong MRN: [id number] Date of Birth: [birth year]  Today's Date: 12/19/2023 SLP Individual Time: 7829-5621 SLP Individual Time Calculation (min): 50 min  Short Term Goals: Week 2: SLP Short Term Goal 1 (Week 2): Patient will utilize swallowing compenstory strategies during consumption of least restrictive diet given supervision multimodal A SLP Short Term Goal 2 (Week 2): Patient will increase speech intelligibility to 100% during conversation with use of strategies given supervision multimodal A SLP Short Term Goal 3 (Week 2): Patient will demonstrate problem solving in mildly complex situations given min multimodal A SLP Short Term Goal 4 (Week 2): Patient will recall and utilize memory compensatory strategies given mod multimodal A SLP Short Term Goal 5 (Week 2): Patient will demonstrate awareness of cognitive and physical changes since admission given min multimodal A SLP Short Term Goal 6 (Week 2): Patient will attend to L visual field during functional tasks with min multimodal A  Skilled Therapeutic Interventions: SLP conducted skilled therapy session targeting cognitive retraining goals. SLP originally arrived with intent to trial Dys2 solids, however patient indicated he was feeling nauseous after lunch and would prefer to hold off on further PO intake. Facilitated various cognitive tasks to target left inattention and problem solving. Patient benefited from min assist to scan to the left during functional tasks. Patient recalled daily events from previous therapy sessions with overall min assist. Patient benefited from min cues for problem solving during mildly complex situational tasks. At end of session, SLP assisted patient with ordering dinner via room phone. Provided education re: reason for Dys1/NTL diet with patient verbalizing understanding of limited food options due to reduced chewing function at this  time. Patient was left in lowered bed with call bell in reach and bed alarm set. SLP will continue to target goals per plan of care.       Pain Pain Assessment Pain Scale: 0-10 Pain Score: 0-No pain  Therapy/Group: Individual Therapy  Jeannie Done, M.A., CCC-SLP  Yetta Barre 12/19/2023, 2:57 PM

## 2023-12-19 NOTE — Progress Notes (Signed)
Occupational Therapy Session Note  Patient Details  Name: Ian Wong MRN: [id number] Date of Birth: 04/27/1961  Today's Date: 12/19/2023 OT Individual Time: 4098-1191 OT Individual Time Calculation (min): 62 min    Short Term Goals: Week 1:  OT Short Term Goal 1 (Week 1): pt will demonstrate improved left side awareness to don L arm and L leg in clothing first to follow through with hemi dressing strategies OT Short Term Goal 2 (Week 1): Pt will don shirt with min A to demonstrate improved memory of dressing techniques. OT Short Term Goal 3 (Week 1): Pt will use L hand to hold wash cloth and wash R arm with 50 % A. OT Short Term Goal 4 (Week 1): pt will ambulated to toilet with CGA with LRAD.  Skilled Therapeutic Interventions/Progress Updates:  Pt greeted supine in bed, pt reports being cold, adjusted thermostat in room. pt agreeable to OT intervention.      Transfers/bed mobility/functional mobility: pt completed all functional ambulation with no Ad and CGA, mild LOB to L when ambulating in hallway.   ADLs:  Grooming: pt completed standing grooming tasks at sink with supervision.   Footwear: donned shoes with MAX A for time mgmt  Transfers: ambulatory transfer into bathroom with no AD and CGA.  Toileting: pt completed 3/3 toileting tasks with light MIN A to pull pants to waist line on L side.    NMR: pt completed various activities foused on LUE coordination and Neuromuscular reeducation:   - pt instructed to Push block forwards/backwards and R<>L with an emphasis on weightbearing into LUE and facilitating improved scapular protraction/retraction and elbow flexion/extension.  -pt completed functional ambulation task with pt instructed to Transfer various blocks/balls from one mat to another with an emphasis on using BUEs in conjunction with each other and equally weightbearing into each extremity. Graded task up and had pt weave in between obstacles with pt instructed to pay  close attention to L side as pt with great difficulty with knocking over cones on L side d/t L inattention - pt completed functional reaching task with pt instructed to grasp cones with LUE and cross midline to stack on L side with an emphasis on improved grasp and L sided attention, pt required step by step cues to sequence steps and required body mechanics, I.e squeeze, lift, open hand  -pt completed seated FMC task with pt instructed to grasp small pegs from board and cross midline to place in cup. + difficulty with this task with pt again needing step by step cues, pt also noted to be more internally/externally distracted during this task needing cues to attend to task.     Ended session with pt supine in bed with all needs within reach and bed alarm activated.                    Therapy Documentation Precautions:  Precautions Precautions: Fall Precaution Comments: L inattention, coretrak Restrictions Weight Bearing Restrictions Per Provider Order: No  Pain: no pain     Therapy/Group: Individual Therapy  Pollyann Glen [facility] 12/19/2023, 12:05 PM

## 2023-12-19 NOTE — Progress Notes (Signed)
Received patient in bed. Coretak in place. Patient alert. Patient watching television. Cletis Media, RN

## 2023-12-19 NOTE — Plan of Care (Signed)
  Problem: Consults Goal: RH STROKE PATIENT EDUCATION Description: See Patient Education module for education specifics  Outcome: Progressing   Problem: RH BOWEL ELIMINATION Goal: RH STG MANAGE BOWEL WITH ASSISTANCE Description: STG Manage Bowel with toileting Assistance. Outcome: Progressing Goal: RH STG MANAGE BOWEL W/MEDICATION W/ASSISTANCE Description: STG Manage Bowel with Medication with mod I Assistance. Outcome: Progressing   Problem: RH SAFETY Goal: RH STG ADHERE TO SAFETY PRECAUTIONS W/ASSISTANCE/DEVICE Description: STG Adhere to Safety Precautions With cues Assistance/Device. Outcome: Progressing   Problem: RH PAIN MANAGEMENT Goal: RH STG PAIN MANAGED AT OR BELOW PT'S PAIN GOAL Outcome: Progressing   Problem: RH KNOWLEDGE DEFICIT Goal: RH STG INCREASE KNOWLEDGE OF HYPERTENSION Description: Patient and family will be able to manage HTN using educational resources for medications and diet modification independently Outcome: Progressing Goal: RH STG INCREASE KNOWLEDGE OF DYSPHAGIA/FLUID INTAKE Description: Patient and family will be able to manage dysphagia using educational resources for medications and diet modification independently Outcome: Progressing Goal: RH STG INCREASE KNOWLEGDE OF HYPERLIPIDEMIA Description: Patient and family will be able to manage HLD using educational resources for medications and diet modification independently Outcome: Progressing Goal: RH STG INCREASE KNOWLEDGE OF STROKE PROPHYLAXIS Description: Patient and family will be able to manage secondary risks using educational resources for medications and diet modification independently Outcome: Progressing

## 2023-12-20 LAB — GLUCOSE, CAPILLARY
Glucose-Capillary: 150 mg/dL — ABNORMAL HIGH (ref 70–99)
Glucose-Capillary: 159 mg/dL — ABNORMAL HIGH (ref 70–99)
Glucose-Capillary: 105 mg/dL — ABNORMAL HIGH (ref 70–99)
Glucose-Capillary: 84 mg/dL (ref 70–99)

## 2023-12-20 NOTE — Progress Notes (Signed)
PROGRESS NOTE   Subjective/Complaints:  Pt doing ok again, slept well, denies pain, LBM yesterday, urinating fine per pt, denies any other complaints or concerns today. Ate 35-75% of meals since yesterday.    ROS: Limited due to cognitive/behavioral, but denied CP, SOB, abd pain, n/v/d/c  Objective:   No results found.  No results for input(s): "WBC", "HGB", "HCT", "PLT" in the last 72 hours.  Recent Labs    12/18/23 0737  NA 134*  K 4.3  CL 99  CO2 26  GLUCOSE 118*  BUN 26*  CREATININE 1.07  CALCIUM 8.8*          Intake/Output Summary (Last 24 hours) at 12/20/2023 1021 Last data filed at 12/20/2023 0937 Gross per 24 hour  Intake 630 ml  Output --  Net 630 ml        Physical Exam: Vital Signs Blood pressure 139/66, pulse 71, temperature 98.7 F (37.1 C), resp. rate 17, height 5\' 10"  (1.778 m), weight 96.6 kg, SpO2 96%.   Constitutional: No distress . Vital signs reviewed. Up at sink washing hands, then sitting at EOB eating  HEENT: NCAT, EOMI, oral membranes moist, NGT Neck: supple Cardiovascular: RRR without murmur. No JVD    Respiratory/Chest: CTA Bilaterally without wheezes or rales. Normal effort    GI/Abdomen: BS +, non-tender, non-distended Ext: no clubbing, cyanosis, or edema Psych: flat but cooperative  Skin: No evidence of breakdown, no evidence of rash over exposed surfaces.   PRIOR EXAMS: Neurologic: Cranial nerves II through XII intact, motor strength is 5/5 in right  deltoid, bicep, tricep, grip, hip flexor, knee extensors, ankle dorsiflexor and plantar flexor 3 to 3+ LUE, 4- to 4/5 LLE--no significant changes 1/17  Musculoskeletal: Full range of motion in all 4 extremities. No joint swelling. No pain with palpation or rom on left foot         Assessment/Plan: 1. Functional deficits which require 3+ hours per day of interdisciplinary therapy in a comprehensive inpatient rehab  setting. Physiatrist is providing close team supervision and 24 hour management of active medical problems listed below. Physiatrist and rehab team continue to assess barriers to discharge/monitor patient progress toward functional and medical goals  Care Tool:  Bathing    Body parts bathed by patient: Chest, Abdomen, Buttocks, Front perineal area, Right upper leg, Left upper leg, Face, Left arm   Body parts bathed by helper: Right arm, Right lower leg, Left lower leg     Bathing assist Assist Level: Moderate Assistance - Patient 50 - 74%     Upper Body Dressing/Undressing Upper body dressing   What is the patient wearing?: Pull over shirt    Upper body assist Assist Level: Moderate Assistance - Patient 50 - 74%    Lower Body Dressing/Undressing Lower body dressing      What is the patient wearing?: Incontinence brief, Pants     Lower body assist Assist for lower body dressing: Moderate Assistance - Patient 50 - 74%     Toileting Toileting    Toileting assist Assist for toileting: Minimal Assistance - Patient > 75%     Transfers Chair/bed transfer  Transfers assist     Chair/bed  transfer assist level: Contact Guard/Touching assist     Locomotion Ambulation   Ambulation assist      Assist level: Contact Guard/Touching assist Assistive device: No Device Max distance: 150'   Walk 10 feet activity   Assist     Assist level: Contact Guard/Touching assist Assistive device: No Device   Walk 50 feet activity   Assist    Assist level: Contact Guard/Touching assist Assistive device: No Device    Walk 150 feet activity   Assist    Assist level: Minimal Assistance - Patient > 75% Assistive device: No Device    Walk 10 feet on uneven surface  activity   Assist Walk 10 feet on uneven surfaces activity did not occur: Safety/medical concerns         Wheelchair     Assist Is the patient using a wheelchair?: Yes Type of Wheelchair:  Manual    Wheelchair assist level: Dependent - Patient 0%      Wheelchair 50 feet with 2 turns activity    Assist        Assist Level: Dependent - Patient 0%   Wheelchair 150 feet activity     Assist      Assist Level: Dependent - Patient 0%   Blood pressure 139/66, pulse 71, temperature 98.7 F (37.1 C), resp. rate 17, height 5\' 10"  (1.778 m), weight 96.6 kg, SpO2 96%.  Medical Problem List and Plan: 1. 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  Antithrombotics: -DVT/anticoagulation:  Pharmaceutical: Lovenox 40mg  daily             -antiplatelet therapy: Aspirin 81 mg daily and Brilinta 90 mg twice daily 3. Pain Management: Tylenol as needed 4. Mood/Behavior/Sleep: Provide emotional support  - Told me he regrets not taking care of himself earlier in his life -1/17 I think he's depressed although certainly some of affect is d/t stroke location. Begin lexapro 5mg  qhs             -neuropsych eval when provider is available             -antipsychotic agents: N/A 5. Neuropsych/cognition: This patient is capable of making decisions on his own behalf. 6. Skin/Wound Care: Routine skin checks 7. Fluids/Electrolytes/Nutrition: Routine in and outs with follow-up chemistries on Monday   BMET improved 1/17---push fluids, continue H2O flushes thru tube    Latest Ref Rng & Units 12/18/2023    7:37 AM 12/14/2023    5:05 AM 12/11/2023    7:05 AM  BMP  Glucose 70 - 99 mg/dL 952  841  324   BUN 8 - 23 mg/dL 26  29  28    Creatinine 0.61 - 1.24 mg/dL 4.01  0.27  2.53   Sodium 135 - 145 mmol/L 134  134  133   Potassium 3.5 - 5.1 mmol/L 4.3  3.8  4.0   Chloride 98 - 111 mmol/L 99  101  102   CO2 22 -  32 mmol/L 26  25  21    Calcium 8.9 - 10.3 mg/dL 8.8  8.6  8.5  8.  Dysphagia.  Dysphagia #2 nectar thick liquids.   -Continue nasogastric tube for nutritional support over weekend. Consider reducing rate of Osmolite to 40cc/hr to help stimulate PO intake.    -Advance diet per speech therapy -12/12/23 leave rate for now, no documentation of intake and pt just arrived; monitor; CBGs up but probably from TF/steroids? No dx of DM mentioned. Continue SSI -12/13/23 pt feels full and thus isn't eating as much (only two meals documented in flowsheet, 10% and 0% eaten); agreed that we'll decrease the rate to 28ml/hr but will need to try to eat more of his trays. CBGs ok.  -Intake still poor 0-40% documented on 1/12 and 1/13 -1/17--ate 0-20-20 yesterday. Discussed the fact that he needs to eat more for [place] to remove his NGT. He expressed an understanding.   -Tube feed reduced per RD. Now 55cc/hr for 10 hours overnight  CBG generally in desired range --1/17 -12/19/23 ate 25-50% of meals yesterday but 0% this AM-- asking for more frosted flakes and milk, encouraged to eat; of note, prednisone completed, could be contributing to less appetite but pt will try to eat more; CBGs fine -12/20/23 CBGs ok, ate 35-75% of meals yesterday, encouraged to continue  CBG (last 3)  Recent Labs    12/19/23 1617 12/19/23 2106 12/20/23 0702  GLUCAP 97 107* 150*   9.  Hyperlipidemia.  Lipitor 20mg  daily 10.  GERD.  Nexium 20mg  daily 11.  Hypothyroidism.  Synthroid daily 12.  COPD exacerbation/respiratory distress.  Patient completed 5-day course of Unasyn.  Prednisone taper completed.   -pt on room air currently. 13. HTN?: unclear if this is a new diagnosis? No mention in notes. BPs elevated overnight. Monitor for now -1/17 fair control. On no BP meds   -1/18-19/25 BPs fine, cont monitoring Vitals:   12/16/23 1914 12/17/23 0546 12/17/23 1513 12/17/23 1935  BP: (!) 148/59 129/71 (!) 141/72 130/60   12/18/23 0518  12/18/23 1420 12/18/23 1856 12/18/23 2003  BP: 133/67 126/69 124/68 132/79   12/19/23 0502 12/19/23 1337 12/19/23 2021 12/20/23 0507  BP: 131/70 127/81 139/65 139/66    14. Bowel regimen:    -last bm 1/16 , type 4. Continue colace  -12/20/23 LBM yesterday, cont regimen LOS: 9 days A FACE TO FACE EVALUATION WAS PERFORMED  [address] 12/20/2023, 10:21 AM

## 2023-12-20 NOTE — Plan of Care (Signed)
  Problem: Consults Goal: RH STROKE PATIENT EDUCATION Description: See Patient Education module for education specifics  Outcome: Progressing   Problem: RH BOWEL ELIMINATION Goal: RH STG MANAGE BOWEL WITH ASSISTANCE Description: STG Manage Bowel with toileting Assistance. Outcome: Progressing Goal: RH STG MANAGE BOWEL W/MEDICATION W/ASSISTANCE Description: STG Manage Bowel with Medication with mod I Assistance. Outcome: Progressing   Problem: RH SAFETY Goal: RH STG ADHERE TO SAFETY PRECAUTIONS W/ASSISTANCE/DEVICE Description: STG Adhere to Safety Precautions With cues Assistance/Device. Outcome: Progressing   Problem: RH PAIN MANAGEMENT Goal: RH STG PAIN MANAGED AT OR BELOW PT'S PAIN GOAL Outcome: Progressing   Problem: RH KNOWLEDGE DEFICIT Goal: RH STG INCREASE KNOWLEDGE OF HYPERTENSION Description: Patient and family will be able to manage HTN using educational resources for medications and diet modification independently Outcome: Progressing Goal: RH STG INCREASE KNOWLEDGE OF DYSPHAGIA/FLUID INTAKE Description: Patient and family will be able to manage dysphagia using educational resources for medications and diet modification independently Outcome: Progressing Goal: RH STG INCREASE KNOWLEGDE OF HYPERLIPIDEMIA Description: Patient and family will be able to manage HLD using educational resources for medications and diet modification independently Outcome: Progressing Goal: RH STG INCREASE KNOWLEDGE OF STROKE PROPHYLAXIS Description: Patient and family will be able to manage secondary risks using educational resources for medications and diet modification independently Outcome: Progressing

## 2023-12-21 LAB — CBC
HCT: 42.8 % (ref 39.0–52.0)
Hemoglobin: 15 g/dL (ref 13.0–17.0)
MCH: 30.9 pg (ref 26.0–34.0)
MCHC: 35 g/dL (ref 30.0–36.0)
MCV: 88.1 fL (ref 80.0–100.0)
Platelets: 346 10*3/uL (ref 150–400)
RBC: 4.86 MIL/uL (ref 4.22–5.81)
RDW: 13.1 % (ref 11.5–15.5)
WBC: 6.2 10*3/uL (ref 4.0–10.5)
nRBC: 0 % (ref 0.0–0.2)

## 2023-12-21 LAB — BASIC METABOLIC PANEL
Anion gap: 10 (ref 5–15)
BUN: 23 mg/dL (ref 8–23)
CO2: 24 mmol/L (ref 22–32)
Calcium: 8.7 mg/dL — ABNORMAL LOW (ref 8.9–10.3)
Chloride: 99 mmol/L (ref 98–111)
Creatinine, Ser: 0.98 mg/dL (ref 0.61–1.24)
GFR, Estimated: >60 mL/min (ref >60)
Glucose, Bld: 117 mg/dL — ABNORMAL HIGH (ref 70–99)
Potassium: 4.2 mmol/L (ref 3.5–5.1)
Sodium: 133 mmol/L — ABNORMAL LOW (ref 135–145)

## 2023-12-21 LAB — GLUCOSE, CAPILLARY
Glucose-Capillary: 121 mg/dL — ABNORMAL HIGH (ref 70–99)
Glucose-Capillary: 112 mg/dL — ABNORMAL HIGH (ref 70–99)
Glucose-Capillary: 88 mg/dL (ref 70–99)
Glucose-Capillary: 84 mg/dL (ref 70–99)

## 2023-12-21 MED ORDER — OLOPATADINE HCL 0.1 % OP SOLN
1.0000 [drp] | Freq: Two times a day (BID) | OPHTHALMIC | Status: DC
  Administered 2023-12-21 – 2023-12-29 (×18): 1 [drp] via OPHTHALMIC
  Filled 2023-12-21: qty 5

## 2023-12-21 NOTE — Progress Notes (Signed)
Occupational Therapy Session Note  Patient Details  Name: Ian Wong MRN: [id number] Date of Birth: 04-07-1961  Today's Date: 12/21/2023 OT Individual Time: 1105-1205 OT Individual Time Calculation (min): 60 min    Short Term Goals: Week 2:  OT Short Term Goal 1 (Week 2): pt will complete 1/3 toileting tasks with CGA OT Short Term Goal 2 (Week 2): pt will complete 2 grooming tasks at sink with CGA demonstrating improved activity tolerance for higher level ADLs OT Short Term Goal 3 (Week 2): pt will incorporate LUE into grooming tasks with < 3 verbal cues  Skilled Therapeutic Interventions/Progress Updates:  Pt greeted supine in bed, pt agreeable to OT intervention.      Transfers/bed mobility/functional mobility: pt completed supine>sit with supervision. Pt completed functional ambulation with no AD and CGA.   Therapeutic activity: continued to work on visual scanning and L sided attention using BITS. Pt completed trail making task with pt instructed to connect numbers 1-25 with an emphasis on visual attention and problem solving. Pt completed task with 1 error, and 22 interruption despite max cues to keep finger on screen.  Pt completed task in 3 mins and 22 secs.   Pt completed visual tracking task with stimulus coming up in all quadrants with an emphasis on scanning to L side. Pt completed task with 93% accuracy, 2.66 sec reaction time,slowest reaction time to L side needing MIN verbal cues to look to L side during task.   Continued to work on LUE coordination with BITS with pt instructed to hold drum stick in L hand and instructed to tap stimulus on screen 1-26. Pt completed task with + time to motor plan how to manipulate drum stick effectively. Pt completed task with 61% accuracy, 10.86 sec reaction time, 16 misses.   Worked in improving LUE motor planning through bimanual tasks with pt instructed to use both hands on drum stick to trace various shapes. Pt with great difficulty  with this task benefiting from bimanual integration.   ADLs:   Footwear: donned shoes from EOB, MINA to manipulate velcros.   Transfers: pt completed ambulatory toilet transfer with no ad and CGA.  Toileting: continent urine void with supervision for 3/3 toileting tasks.    NMR:  Pt completed various activities to provide NMR to LUE: -pushing hand scooter to targets on mat table from sitting with an emphasis on targeted reaching, NMR, and AROM in LUE. Pt completed task with + time and MIN verbal/tactile cues for set- up and body mechanics.  -pt completed various exercises with weighted ball including "rainbows", chest presses, and unilateral scapular protraction/retraction. Pt needed MIN verbal/tactile cues for set- up and body mechanics.   Ended session with pt  seated EOB  with all needs within reach.              Therapy Documentation Precautions:  Precautions Precautions: Fall Precaution Comments: L inattention, cortrak Restrictions Weight Bearing Restrictions Per Provider Order: No  Pain: no pain reported during session     Therapy/Group: Individual Therapy  Barron Schmid 12/21/2023, 12:25 PM

## 2023-12-21 NOTE — Progress Notes (Signed)
Occupational Therapy Session Note  Patient Details  Name: Ian Wong MRN: [id number] Date of Birth: [birth year]  Today's Date: 12/21/2023 OT Individual Time: 4696-2952 OT Individual Time Calculation (min): 45 min    Short Term Goals: Week 2:  OT Short Term Goal 1 (Week 2): pt will complete 1/3 toileting tasks with CGA OT Short Term Goal 2 (Week 2): pt will complete 2 grooming tasks at sink with CGA demonstrating improved activity tolerance for higher level ADLs OT Short Term Goal 3 (Week 2): pt will incorporate LUE into grooming tasks with < 3 verbal cues  Skilled Therapeutic Interventions/Progress Updates:    Pt received in bed and agreeable to shower. Pt first sat to EOB and engaged in LUE AROM with reaching arm to shoulder height, extending elbow and extending fingers.  He was able to doff shirt with verbal cues only.  He stood up with Supervision and ambulated with CGA with no AD to bathroom. Sat to toilet but no void,  doffed pants to feet in standing and then cued to sit down to doff over feet vs standing and stepping out of his pants. Pt stepped into shower and used L hand to grasp washcloth to wash under R arm pit and wash R arm fully.  Improved active use and coordination of L hand.   He stood to wash face and hair and buttocks with light CGA.   Cues to dry off fully and then he sat on chair in bathroom to dress.  Improved L side awareness to don L foot first.  For underwear he was able to get over his feet and then 75% way over hips.  For pants he did place L foot in but kept getting pants turned around so needed A for orienting legs of pants.  Actively used L hand to try to pull pants up but had difficulty maintaining grasp and pulling up with his shoulder.   Pt turning head to left and scanning his environment without cues today.   Pt returned to sit EOB.  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 Pt recalled that his niece who is a  PT visited him this weekend and told him to work on stretching out his fingers demonstrating improving memory.  Pt opted to lay down to rest until his next PT session. Pt resting in bed with all needs met. Alarm set and call light in reach.    Therapy Documentation Precautions:  Precautions Precautions: Fall Precaution Comments: L inattention, cortrak Restrictions Weight Bearing Restrictions Per Provider Order: No   Pain:  No c/o pain  ADL: ADL Eating: Minimal assistance Grooming: Minimal assistance Upper Body Bathing: Minimal assistance Where Assessed-Upper Body Bathing: Shower Lower Body Bathing: Contact guard Where Assessed-Lower Body Bathing: Shower Upper Body Dressing: Moderate assistance Where Assessed-Upper Body Dressing: Edge of bed Lower Body Dressing: Maximal assistance Where Assessed-Lower Body Dressing: Edge of bed Toileting: Maximal assistance Where Assessed-Toileting: Teacher, adult education: Curator Method: Surveyor, minerals: Acupuncturist: Insurance underwriter Method: Designer, industrial/product: Grab bars, Transfer tub bench   Therapy/Group: Individual Therapy  Merica Prell 12/21/2023, 10:33 AM

## 2023-12-21 NOTE — Plan of Care (Signed)
  Problem: Consults Goal: RH STROKE PATIENT EDUCATION Description: See Patient Education module for education specifics  Outcome: Progressing   Problem: RH BOWEL ELIMINATION Goal: RH STG MANAGE BOWEL WITH ASSISTANCE Description: STG Manage Bowel with toileting Assistance. Outcome: Progressing Goal: RH STG MANAGE BOWEL W/MEDICATION W/ASSISTANCE Description: STG Manage Bowel with Medication with mod I Assistance. Outcome: Progressing   Problem: RH SAFETY Goal: RH STG ADHERE TO SAFETY PRECAUTIONS W/ASSISTANCE/DEVICE Description: STG Adhere to Safety Precautions With cues Assistance/Device. Outcome: Progressing   Problem: RH PAIN MANAGEMENT Goal: RH STG PAIN MANAGED AT OR BELOW PT'S PAIN GOAL Outcome: Progressing   Problem: RH KNOWLEDGE DEFICIT Goal: RH STG INCREASE KNOWLEDGE OF HYPERTENSION Description: Patient and family will be able to manage HTN using educational resources for medications and diet modification independently Outcome: Progressing Goal: RH STG INCREASE KNOWLEDGE OF DYSPHAGIA/FLUID INTAKE Description: Patient and family will be able to manage dysphagia using educational resources for medications and diet modification independently Outcome: Progressing Goal: RH STG INCREASE KNOWLEGDE OF HYPERLIPIDEMIA Description: Patient and family will be able to manage HLD using educational resources for medications and diet modification independently Outcome: Progressing Goal: RH STG INCREASE KNOWLEDGE OF STROKE PROPHYLAXIS Description: Patient and family will be able to manage secondary risks using educational resources for medications and diet modification independently Outcome: Progressing

## 2023-12-21 NOTE — Progress Notes (Signed)
New order for Patanol eye drops one drop to left eye. Patient received first dose no adverse reactions. Patient tolerated well. Patient currently with therapy. Cletis Media, RN

## 2023-12-21 NOTE — Progress Notes (Signed)
PROGRESS NOTE   Subjective/Complaints:  No issues overnite,   ROS: Limited due to cognitive/behavioral, but denied CP, SOB, abd pain, n/v/d/c  Objective:   No results found.  Recent Labs    12/21/23 0538  WBC 6.2  HGB 15.0  HCT 42.8  PLT 346    Recent Labs    12/21/23 0538  NA 133*  K 4.2  CL 99  CO2 24  GLUCOSE 117*  BUN 23  CREATININE 0.98  CALCIUM 8.7*          Intake/Output Summary (Last 24 hours) at 12/21/2023 0827 Last data filed at 12/21/2023 0813 Gross per 24 hour  Intake 480 ml  Output --  Net 480 ml        Physical Exam: Vital Signs Blood pressure 131/80, pulse 81, temperature 98.2 F (36.8 C), resp. rate 18, height 5\' 10"  (1.778 m), weight 96.6 kg, SpO2 97%.  General: No acute distress Mood and affect are appropriate Heart: Regular rate and rhythm no rubs murmurs or extra sounds Lungs: Clear to auscultation, breathing unlabored, no rales or wheezes Abdomen: Positive bowel sounds, soft nontender to palpation, nondistended Extremities: No clubbing, cyanosis, or edema Skin: No evidence of breakdown, no evidence of rash  Musculoskeletal: Full range of motion in all 4 extremities. No joint swelling   PRIOR EXAMS: Neurologic: Cranial nerves II through XII intact, motor strength is 5/5 in right  deltoid, bicep, tricep, grip, hip flexor, knee extensors, ankle dorsiflexor and plantar flexor 3 to 3+ LUE, 4- to 4/5 LLE  Musculoskeletal: reduced active left shoulder abduction  Assessment/Plan: 1. Functional deficits which require 3+ hours per day of interdisciplinary therapy in a comprehensive inpatient rehab setting. Physiatrist is providing close team supervision and 24 hour management of active medical problems listed below. Physiatrist and rehab team continue to assess barriers to discharge/monitor patient progress toward functional and medical goals  Care Tool:  Bathing    Body  parts bathed by patient: Chest, Abdomen, Buttocks, Front perineal area, Right upper leg, Left upper leg, Face, Left arm   Body parts bathed by helper: Right arm, Right lower leg, Left lower leg     Bathing assist Assist Level: Moderate Assistance - Patient 50 - 74%     Upper Body Dressing/Undressing Upper body dressing   What is the patient wearing?: Pull over shirt    Upper body assist Assist Level: Moderate Assistance - Patient 50 - 74%    Lower Body Dressing/Undressing Lower body dressing      What is the patient wearing?: Incontinence brief, Pants     Lower body assist Assist for lower body dressing: Moderate Assistance - Patient 50 - 74%     Toileting Toileting    Toileting assist Assist for toileting: Minimal Assistance - Patient > 75%     Transfers Chair/bed transfer  Transfers assist     Chair/bed transfer assist level: Contact Guard/Touching assist     Locomotion Ambulation   Ambulation assist      Assist level: Contact Guard/Touching assist Assistive device: No Device Max distance: 150'   Walk 10 feet activity   Assist     Assist level: Contact Guard/Touching assist Assistive  device: No Device   Walk 50 feet activity   Assist    Assist level: Contact Guard/Touching assist Assistive device: No Device    Walk 150 feet activity   Assist    Assist level: Minimal Assistance - Patient > 75% Assistive device: No Device    Walk 10 feet on uneven surface  activity   Assist Walk 10 feet on uneven surfaces activity did not occur: Safety/medical concerns         Wheelchair     Assist Is the patient using a wheelchair?: Yes Type of Wheelchair: Manual    Wheelchair assist level: Dependent - Patient 0%      Wheelchair 50 feet with 2 turns activity    Assist        Assist Level: Dependent - Patient 0%   Wheelchair 150 feet activity     Assist      Assist Level: Dependent - Patient 0%   Blood pressure  131/80, pulse 81, temperature 98.2 F (36.8 C), resp. rate 18, height 5\' 10"  (1.778 m), weight 96.6 kg, SpO2 97%.  Medical Problem List and Plan: 1. 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  Antithrombotics: -DVT/anticoagulation:  Pharmaceutical: Lovenox 40mg  daily             -antiplatelet therapy: Aspirin 81 mg daily and Brilinta 90 mg twice daily 3. Pain Management: Tylenol as needed 4. Mood/Behavior/Sleep: Provide emotional support  - Told me he regrets not taking care of himself earlier in his life -1/17 I think he's depressed although certainly some of affect is d/t stroke location. Begin lexapro 5mg  qhs             -neuropsych eval when provider is available             -antipsychotic agents: N/A 5. Neuropsych/cognition: This patient is capable of making decisions on his own behalf. 6. Skin/Wound Care: Routine skin checks 7. Fluids/Electrolytes/Nutrition: Routine in and outs with follow-up chemistries on Monday   BMET improved 1/17---push fluids, continue H2O flushes thru tube    Latest Ref Rng & Units 12/21/2023    5:38 AM 12/18/2023    7:37 AM 12/14/2023    5:05 AM  BMP  Glucose 70 - 99 mg/dL 161  096  045   BUN 8 - 23 mg/dL 23  26  29    Creatinine 0.61 - 1.24 mg/dL 4.09  8.11  9.14   Sodium 135 - 145 mmol/L 133  134  134   Potassium 3.5 - 5.1 mmol/L 4.2  4.3  3.8   Chloride 98 - 111 mmol/L 99  99  101   CO2 22 - 32 mmol/L 24  26  25    Calcium 8.9 - 10.3 mg/dL 8.7  8.8  8.6    8.  Dysphagia.  Dysphagia #1 nectar thick liquids.   -Continue nasogastric tube for nutritional support over weekend. Consider reducing rate of Osmolite to 40cc/hr to help stimulate PO intake.     -12/19/23 ate 25-50% of  meals yesterday but 0% this AM-- asking for more frosted flakes and  milk, encouraged to eat; of note, prednisone completed, could be contributing to less appetite but pt will try to eat more; CBGs fine -12/20/23 CBGs ok, ate 35-75% of meals yesterday, encouraged to continue 12/21/23- intake at 25-50% am meal generally lower CBG (last 3)  Recent Labs    12/20/23 1612 12/20/23 2036 12/21/23 0608  GLUCAP 105* 84 121*   9.  Hyperlipidemia.  Lipitor 20mg  daily 10.  GERD.  Nexium 20mg  daily 11.  Hypothyroidism.  Synthroid daily 12.  COPD exacerbation/respiratory distress.  Patient completed 5-day course of Unasyn.  Prednisone taper completed.   -pt on room air currently. 13. HTN?: unclear if this is a new diagnosis? No mention in notes. BPs elevated overnight. Monitor for now 1/20-controlled  Vitals:   12/17/23 1935 12/18/23 0518 12/18/23 1420 12/18/23 1856  BP: 130/60 133/67 126/69 124/68   12/18/23 2003 12/19/23 0502 12/19/23 1337 12/19/23 2021  BP: 132/79 131/70 127/81 139/65   12/20/23 0507 12/20/23 1326 12/20/23 1957 12/21/23 0429  BP: 139/66 136/81 (!) 115/57 131/80    14. Bowel regimen:    -last bm 1/16 , type 4. Continue colace  -12/20/23 LBM yesterday, cont regimen LOS: 10 days A FACE TO FACE EVALUATION WAS PERFORMED  Erick Colace 12/21/2023, 8:27 AM

## 2023-12-21 NOTE — Progress Notes (Signed)
Received patient in bed and alert watching television. Patient received and tolerated all morning medications. Patient did not receive sliding scale insulin related to patient being below scale to receive any. Patient active with therapy. Coretrak in place and patent. Cletis Media, RN

## 2023-12-21 NOTE — Progress Notes (Signed)
Speech Language Pathology Daily Session Note  Patient Details  Name: Ian Wong MRN: [id number] Date of Birth: [birth year]  Today's Date: 12/21/2023 SLP Individual Time: 7829-5621 SLP Individual Time Calculation (min): 43 min  Short Term Goals: Week 2: SLP Short Term Goal 1 (Week 2): Patient will utilize swallowing compenstory strategies during consumption of least restrictive diet given supervision multimodal A SLP Short Term Goal 2 (Week 2): Patient will increase speech intelligibility to 100% during conversation with use of strategies given supervision multimodal A SLP Short Term Goal 3 (Week 2): Patient will demonstrate problem solving in mildly complex situations given min multimodal A SLP Short Term Goal 4 (Week 2): Patient will recall and utilize memory compensatory strategies given mod multimodal A SLP Short Term Goal 5 (Week 2): Patient will demonstrate awareness of cognitive and physical changes since admission given min multimodal A SLP Short Term Goal 6 (Week 2): Patient will attend to L visual field during functional tasks with min multimodal A  Skilled Therapeutic Interventions:  Patient was seen in PM to address cognitive re- training. Pt was alert and seen at bedside upon SLP arrival. He denied pain and was agreeable for session. SLP reviewed WRAP compensatory recall strategies and examples of utilization. Given a moderate level paragraph presented verbally, SLP guided pt in repetition and association strategies. Given a 15 minute delay, pt recalled information with 67% acc indep improving to 83% acc with min A. SLP also addressed speech intelligibility during problem solving task. Pt challenged to identify solutions to daily living, financial, and medical situations presented verbally. Pt identified most appropriate solution with 71% accuracy. He remained 100% intelligible with mildly decreased vocal intensity and flat affect. At conclusion of session, pt was left at bedside  with call button within reach and bed alarm active. SLP to continue POC.   Pain Pain Assessment Pain Scale: 0-10 Pain Score: 0-No pain  Therapy/Group: Individual Therapy  Renaee Munda 12/21/2023, 3:52 PM

## 2023-12-21 NOTE — Progress Notes (Signed)
Calorie Count Results Note   48 hour calorie count ordered.  Diet: Dysphagia 1, Nectar Thick Liquids Supplements: Ensure Enlive - TID, Mighty Shake - TID  Estimated Nutritional Needs:  Kcal:  2000-2250 kcal/day Protein:  98-113 gm/day Fluid:  2000-2250 mL/day  Day 1 - 12/18/23 Breakfast: per EMR ate 25%, unsure of what  Lunch: per EMR ate 50%, unsure of what  Dinner: 241 kcal, and 7 gm protein Supplements: accepted 2 Ensure's, unsure amount consumed  Total intake: 241 kcal (12% of minimum estimated needs)  7 protein (7% of minimum estimated needs)  Day 2 - 12/19/23 Breakfast: per EMR pt ate 0% Lunch: per EMR ate 75%, unsure of what  Dinner: per EMR ate 35%, unsure of what Supplements: accepted 2 Ensure's, unsure amount consumed  Total intake: unable to calculate  Day 3 - 12/20/23 Breakfast: per EMR ate 35%, unsure of what  Lunch: no documentation in EMR Dinner: per EMR ate 50%, unsure of what  Supplements: accepted 3 Ensure's, unsure amount consumed  Total intake: unable to calculate  Day 4 - 12/21/23 Breakfast: 238 calories, 11 gm protein Supplements: None  Total intake: 238 kcal (12% of minimum estimated needs)  11 protein (11% of minimum estimated needs)  Nutrition Diagnosis: Inadequate oral intake related to dysphagia, acute illness as evidenced by meal completion < 50%, other (comment) (need for tube feeding).   Goal: Patient will meet greater than or equal to 90% of their needs   Intervention:  Continue tube feeding via Cortrak: Osmolite 1.5 at 55 ml/h x 10 hours (550 ml per day) from 8PM-6AM Prosource TF20 60 ml QD 150 ml FWF Q6H daily Provides 905 kcal (45% est needs), 54 gm protein (55% est needs),  419 ml free water daily (1019 ml TF+flush-51% est needs) Continue to monitor PO intake and adjust TF rate as needed Mighty Shake (nectar thick) TID with meals    Ensure Enlive po TID between meals   Kirby Crigler RD, LDN Clinical Dietitian

## 2023-12-22 LAB — GLUCOSE, CAPILLARY
Glucose-Capillary: 109 mg/dL — ABNORMAL HIGH (ref 70–99)
Glucose-Capillary: 89 mg/dL (ref 70–99)
Glucose-Capillary: 77 mg/dL (ref 70–99)
Glucose-Capillary: 117 mg/dL — ABNORMAL HIGH (ref 70–99)

## 2023-12-22 NOTE — Progress Notes (Signed)
Occupational Therapy Session Note  Patient Details  Name: Ian Wong MRN: [id number] Date of Birth: 04/24/1961  Today's Date: 12/22/2023 OT Individual Time: 2440-1027 OT Individual Time Calculation (min): 60 min    Short Term Goals: Week 2:  OT Short Term Goal 1 (Week 2): pt will complete 1/3 toileting tasks with CGA OT Short Term Goal 2 (Week 2): pt will complete 2 grooming tasks at sink with CGA demonstrating improved activity tolerance for higher level ADLs OT Short Term Goal 3 (Week 2): pt will incorporate LUE into grooming tasks with < 3 verbal cues  Skilled Therapeutic Interventions/Progress Updates:    Pt received in room dressed and ready for the day. Pt requested to toilet. Ambulated to bathroom with CGA and due to L neglect he bumped his L shoulder into door frame.  Pt then stood to urinate, managing clothing well.  Ambulated to sink to wash hands.  Cued pt to use his L hand to open door of his room to walk to hallway.  In his attempt to do so he extended his arm without adjusting his feet and had a small LOB to the L with min A to recover. Had pt try 2nd time with improved balance. Pt ambulated to tub room to see how the transfer tub bench could work in his home. He described a step up similar to a garden tub.  Will need to discuss with his wife bathroom layout to determine what type of DME he will need.   Pt ambulated to gym to engage in numerous NMR activities for LUE:  B hands opening for zoom ball with assist to guide B arms opening at same height,  B integration with fastening large nuts onto bolts,  single hand grasp and release to pick up items.  Pushing and pulling activity integrating active grasp and release.  Pt will often have difficulty initiating a movement such as opening his fingers to release his grasp but when cued "tell your fingers to open" he can release his grasp and fully open his hand.    Reassessed vision.  L eye blurry, B eyes able to track but with cues  to not move head,  slight L visual field cut, R eye does not adduct for convergence.    Pt participates extremely well. Ambulated back to bed to rest.  Alarm on and all needs met.    Therapy Documentation Precautions:  Precautions Precautions: Fall Precaution Comments: L inattention, cortrak Restrictions Weight Bearing Restrictions Per Provider Order: No   Pain: Pain Assessment Pain Scale: 0-10 Pain Score: 0-No pain ADL: ADL Eating: Minimal assistance Grooming: Minimal assistance Upper Body Bathing: Supervision/safety Where Assessed-Upper Body Bathing: Shower Lower Body Bathing: Contact guard Where Assessed-Lower Body Bathing: Shower Upper Body Dressing: Minimal assistance Where Assessed-Upper Body Dressing: Edge of bed Lower Body Dressing: Minimal assistance Where Assessed-Lower Body Dressing: Edge of bed Toileting: Minimal assistance Where Assessed-Toileting: Teacher, adult education: Furniture conservator/restorer Method: Proofreader: Acupuncturist: Administrator, arts Method: Designer, industrial/product: Grab bars, Transfer tub bench    Therapy/Group: Individual Therapy  Abbegale Stehle 12/22/2023, 12:56 PM

## 2023-12-22 NOTE — Progress Notes (Signed)
Speech Language Pathology Daily Session Note  Patient Details  Name: Ian Wong MRN: [id number] Date of Birth: 1961-04-21  Today's Date: 12/22/2023 SLP Individual Time: 1201-1250 SLP Individual Time Calculation (min): 49 min  Short Term Goals: Week 2: SLP Short Term Goal 1 (Week 2): Patient will utilize swallowing compenstory strategies during consumption of least restrictive diet given supervision multimodal A SLP Short Term Goal 2 (Week 2): Patient will increase speech intelligibility to 100% during conversation with use of strategies given supervision multimodal A SLP Short Term Goal 3 (Week 2): Patient will demonstrate problem solving in mildly complex situations given min multimodal A SLP Short Term Goal 4 (Week 2): Patient will recall and utilize memory compensatory strategies given mod multimodal A SLP Short Term Goal 5 (Week 2): Patient will demonstrate awareness of cognitive and physical changes since admission given min multimodal A SLP Short Term Goal 6 (Week 2): Patient will attend to L visual field during functional tasks with min multimodal A  Skilled Therapeutic Interventions:  Patient was seen in PM to address cognitive re-training and dysphagia management. Pt was alert and seen at bedside upon SLP arrival. He was agreeable for session. SLP challenged pt in problem solving and left inattention. Given a structured matching task displayed across bedside table, pt was challenged to match similar objects attending from left to right visual field. Pt attended to left side with supervision and ultimately completed task with 100% acc. In additional minutes of session, SLP addressed dysphagia management. Pt was presented with a Dys 2 trial tray. Pt administered all trials with indep use of small single boluses. Pt with mildly decreased bolus formation and munch like manipulation as opposed to rotary mastication. He presented with min lingual residue on the left side post swallow in  some opportunities with ability to clear oral cavity indep across all trials. He recalled buccal clearing strategies of finger and/ or lingual sweeps indep. SLP recommending continuation of Dys 1 solids and nectar liquids with an additional Dys 2 trial tray before recommending Dys 2 upgrade. Pt left seated upright on EOB at conclusion of session with call button within reach and bed alarm active. SLP to continue POC.   Pain Pain Assessment Pain Scale: 0-10 Pain Score: 0-No pain  Therapy/Group: Individual Therapy  Renaee Munda 12/22/2023, 1:03 PM

## 2023-12-22 NOTE — Progress Notes (Signed)
PROGRESS NOTE   Subjective/Complaints:  Appears more awake, brighter this am , no new issues overnite, states he drank 3 Ensures yesterday (~1000 kcal)  ROS: Limited due to cognitive/behavioral, but denied CP, SOB, abd pain, n/v/d/c  Objective:   No results found.  Recent Labs    12/21/23 0538  WBC 6.2  HGB 15.0  HCT 42.8  PLT 346    Recent Labs    12/21/23 0538  NA 133*  K 4.2  CL 99  CO2 24  GLUCOSE 117*  BUN 23  CREATININE 0.98  CALCIUM 8.7*          Intake/Output Summary (Last 24 hours) at 12/22/2023 0713 Last data filed at 12/21/2023 1756 Gross per 24 hour  Intake 870 ml  Output --  Net 870 ml        Physical Exam: Vital Signs Blood pressure 116/70, pulse 76, temperature 98.2 F (36.8 C), resp. rate 18, height 5\' 10"  (1.778 m), weight 96.6 kg, SpO2 98%.  General: No acute distress Mood and affect are appropriate Heart: Regular rate and rhythm no rubs murmurs or extra sounds Lungs: Clear to auscultation, breathing unlabored, no rales or wheezes Abdomen: Positive bowel sounds, soft nontender to palpation, nondistended Extremities: No clubbing, cyanosis, or edema Skin: No evidence of breakdown, no evidence of rash  Musculoskeletal: Full range of motion in all 4 extremities. No joint swelling   PRIOR EXAMS: Neurologic: Cranial nerves II through XII intact, motor strength is 5/5 in right  deltoid, bicep, tricep, grip, hip flexor, knee extensors, ankle dorsiflexor and plantar flexor 3 to 3+ LUE, 4- to 4/5 LLE  Musculoskeletal: reduced active left shoulder abduction  Assessment/Plan: 1. Functional deficits which require 3+ hours per day of interdisciplinary therapy in a comprehensive inpatient rehab setting. Physiatrist is providing close team supervision and 24 hour management of active medical problems listed below. Physiatrist and rehab team continue to assess barriers to  discharge/monitor patient progress toward functional and medical goals  Care Tool:  Bathing    Body parts bathed by patient: Chest, Abdomen, Buttocks, Front perineal area, Right upper leg, Left upper leg, Face, Left arm   Body parts bathed by helper: Right arm, Right lower leg, Left lower leg     Bathing assist Assist Level: Moderate Assistance - Patient 50 - 74%     Upper Body Dressing/Undressing Upper body dressing   What is the patient wearing?: Pull over shirt    Upper body assist Assist Level: Moderate Assistance - Patient 50 - 74%    Lower Body Dressing/Undressing Lower body dressing      What is the patient wearing?: Incontinence brief, Pants     Lower body assist Assist for lower body dressing: Moderate Assistance - Patient 50 - 74%     Toileting Toileting    Toileting assist Assist for toileting: Minimal Assistance - Patient > 75%     Transfers Chair/bed transfer  Transfers assist     Chair/bed transfer assist level: Contact Guard/Touching assist     Locomotion Ambulation   Ambulation assist      Assist level: Contact Guard/Touching assist Assistive device: No Device Max distance: 150'   Walk 10  feet activity   Assist     Assist level: Contact Guard/Touching assist Assistive device: No Device   Walk 50 feet activity   Assist    Assist level: Contact Guard/Touching assist Assistive device: No Device    Walk 150 feet activity   Assist    Assist level: Minimal Assistance - Patient > 75% Assistive device: No Device    Walk 10 feet on uneven surface  activity   Assist Walk 10 feet on uneven surfaces activity did not occur: Safety/medical concerns         Wheelchair     Assist Is the patient using a wheelchair?: Yes Type of Wheelchair: Manual    Wheelchair assist level: Dependent - Patient 0%      Wheelchair 50 feet with 2 turns activity    Assist        Assist Level: Dependent - Patient 0%    Wheelchair 150 feet activity     Assist      Assist Level: Dependent - Patient 0%   Blood pressure 116/70, pulse 76, temperature 98.2 F (36.8 C), resp. rate 18, height 5\' 10"  (1.778 m), weight 96.6 kg, SpO2 98%.  Medical Problem List and Plan: 1. 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  Antithrombotics: -DVT/anticoagulation:  Pharmaceutical: Lovenox 40mg  daily             -antiplatelet therapy: Aspirin 81 mg daily and Brilinta 90 mg twice daily 3. Pain Management: Tylenol as needed 4. Mood/Behavior/Sleep: Provide emotional support  - Told me he regrets not taking care of himself earlier in his life -1/17. Begin lexapro 5mg  qhs             -neuropsych eval when provider is available             -antipsychotic agents: N/A 5. Neuropsych/cognition: This patient is capable of making decisions on his own behalf. 6. Skin/Wound Care: Routine skin checks 7. Fluids/Electrolytes/Nutrition: Routine in and outs with follow-up chemistries on Monday   BMET improved 1/17---push fluids, continue H2O flushes thru tube    Latest Ref Rng & Units 12/21/2023    5:38 AM 12/18/2023    7:37 AM 12/14/2023    5:05 AM  BMP  Glucose 70 - 99 mg/dL 403  474  259   BUN 8 - 23 mg/dL 23  26  29    Creatinine 0.61 - 1.24 mg/dL 5.63  8.75  6.43   Sodium 135 - 145 mmol/L 133  134  134   Potassium 3.5 - 5.1 mmol/L 4.2  4.3  3.8   Chloride 98 - 111 mmol/L 99  99  101   CO2 22 - 32 mmol/L 24  26  25    Calcium 8.9 - 10.3 mg/dL 8.7  8.8  8.6    8.  Dysphagia.  Dysphagia #1 nectar thick liquids.   -Continue nasogastric tube for nutritional support over weekend. Consider reducing rate of Osmolite to 40cc/hr to  help stimulate PO intake.     -12/19/23 ate 25-50% of meals yesterday but 0% this  AM-- asking for more frosted flakes and milk, encouraged to eat; of note, prednisone completed, could be contributing to less appetite but pt will try to eat more; CBGs fine -12/20/23 CBGs ok, ate 35-75% of meals yesterday, encouraged to continue 12/22/23- intake at 15-50% am meal generally lower, discuss repeat MBS with SLP in am conf CBG (last 3)  Recent Labs    12/21/23 1638 12/21/23 2104 12/22/23 0615  GLUCAP 88 84 109*   9.  Hyperlipidemia.  Lipitor 20mg  daily 10.  GERD.  Nexium 20mg  daily 11.  Hypothyroidism.  Synthroid daily 12.  COPD exacerbation/respiratory distress.  Patient completed 5-day course of Unasyn.  Prednisone taper completed.   -pt on room air currently. 13. HTN?: unclear if this is a new diagnosis? No mention in notes. BPs elevated overnight. Monitor for now 1/21 controlled off meds  Vitals:   12/18/23 1856 12/18/23 2003 12/19/23 0502 12/19/23 1337  BP: 124/68 132/79 131/70 127/81   12/19/23 2021 12/20/23 0507 12/20/23 1326 12/20/23 1957  BP: 139/65 139/66 136/81 (!) 115/57   12/21/23 0429 12/21/23 1444 12/21/23 2041 12/22/23 0442  BP: 131/80 139/65 122/75 116/70    14. Bowel regimen:    -last bm 1/16 , type 4. Continue colace  -12/20/23 LBM yesterday, cont regimen LOS: 11 days A FACE TO FACE EVALUATION WAS PERFORMED  Erick Colace 12/22/2023, 7:13 AM

## 2023-12-22 NOTE — Progress Notes (Signed)
Physical Therapy Session Note  Patient Details  Name: Ian Wong MRN: [id number] Date of Birth: 1961-03-15  {CHL IP REHAB PT TIME CALCULATION:304800500}  Short Term Goals: {WUJ:8119147}  Skilled Therapeutic Interventions/Progress Updates:      Therapy Documentation Precautions:  Precautions Precautions: Fall Precaution Comments: L inattention, cortrak Restrictions Weight Bearing Restrictions Per Provider Order: No General:   Vital Signs: Therapy Vitals Temp: 98.2 F (36.8 C) Pulse Rate: 76 Resp: 18 BP: 116/70 Oxygen Therapy SpO2: 98 % Pain:   Mobility:   Locomotion :    Trunk/Postural Assessment :    Balance:   Exercises:   Other Treatments:      Therapy/Group: {Therapy/Group:3049007}  Loel Dubonnet 12/22/2023, 8:27 AM

## 2023-12-22 NOTE — Progress Notes (Addendum)
Nutrition Follow-up  DOCUMENTATION CODES:   Not applicable  INTERVENTION:   -Continue nocturnal tube feeding via Cortrak: Osmolite 1.5 at 55 ml/h x 10 hours  (550 ml per day) Prosource TF20 60 ml daily Provides 905 kcal, 54 gm protein, 419 ml free water daily, additional free water flushes of 150 mL Q6 hrs. Note this tube feeding regimen meeting 45% estimated calorie and 55% estimated protein requirement. Would continue or even increase tube feeding provision pending results of calorie count.  -Dysphagia II diet.  -Continue Ensure Enlive po TID, each supplement provides 350 kcal and 20 grams of protein.  -Continue Magic cup BID with meals, each supplement provides 290 kcal and 9 grams of protein  -Calorie count started today, 1/21 to run through 1/23.     NUTRITION DIAGNOSIS:   Inadequate oral intake related to dysphagia, acute illness as evidenced by meal completion < 50%, other (comment) (need for tube feeding).  -ongoing  GOAL:   Patient will meet greater than or equal to 90% of their needs  -progressing  MONITOR:   PO intake, Weight trends, Supplement acceptance, TF tolerance, Diet advancement, Skin, Labs  REASON FOR ASSESSMENT:   Follow up  ASSESSMENT:   Pt with hx of GERD and seizure disorder presented to ED with R gaze deviation and difficulty speaking. Imaging in ED suggestive of ischemic right MCA stroke.  12/30 - Intubated, TNKase and mechanical thrombectomy with right carotid stenting and angioplasty, NPO  12/31 - Extubated, NPO 1/2 - Hypertonic saline discontinued 1/3 - MBS, DYS1 diet, honey thick liquids, Cortrak placed  1/7 - MBS, DYS1 diet, nectar thick liquids, adjusted to nocturnal feeds 1/10 - DYS 2 diet, nectar thick liquids, transfer to CIR 1/11 - Swallow eval, DYS 1 diet, nectar thick liquids 1/13 - Nocturnal feeds initiated  Results of calorie count from 1/17-1/20 revealed patient meeting less than 25% estimated needs PO.  Calorie count  ordered again starting 1/21-1/23.  Intakes recorded average 37% x 8 meals. Patient states he consumed three Ensures yesterday and is taking the [employer]. Osmolite 1.5 provide nocturnal at 55 mL/hr x 10 hrs.  Patient complains of a poor appetite, for unknown reason although admits to feeling nausea intermittently. Encouraged patient to inform his RN when feeling nauseated. Patient appears to have a flat affect.  Weight needs updated, most recent from 01/15.   Medications reviewed and include colace, novolog SS 3x daily with meals, synthroid.  Labs reviewed    Diet Order:   Diet Order             DIET DYS 2 Room service appropriate? Yes; Fluid consistency: Thin  Diet effective 1000                   EDUCATION NEEDS:   Not appropriate for education at this time  Skin:  Skin Assessment: Skin Integrity Issues: Skin Integrity Issues:: Incisions Incisions: thigh  Last BM:  12/21/2023, type 4-medium  Height:   Ht Readings from Last 1 Encounters:  12/11/23 5\' 10"  (1.778 m)    Weight:   Wt Readings from Last 1 Encounters:  12/16/23 96.6 kg    Ideal Body Weight:  75.5 kg  BMI:  Body mass index is 30.56 kg/m.  Estimated Nutritional Needs:   Kcal:  2000-2250 kcal/day  Protein:  98-113 gm/day  Fluid:  2000-2250 mL/day    Alvino Chapel, RDLD Clinical Dietitian If unable to reach, please contact "[facility] Inpatient" secure chat group between 8 am-4 pm daily"

## 2023-12-22 NOTE — Progress Notes (Signed)
Physical Therapy Session Note  Patient Details  Name: Ian Wong MRN: [id number] Date of Birth: 1961-07-03  Today's Date: 12/22/2023 PT Individual Time: 0820-0910 PT Individual Time Calculation (min): 50 min   Short Term Goals: Week 2:  PT Short Term Goal 1 (Week 2): Pt will complete transfers with consistent supervision and LRAD PT Short Term Goal 2 (Week 2): Pt will ambulate >300' with supervision and LRAD. PT Short Term Goal 3 (Week 2): Pt will improve score of Berg Balance Scale by at least 7 points in order to demonstrate progress. PT Short Term Goal 4 (Week 2): Pt will ambulate ramp with overall supervision.  Skilled Therapeutic Interventions/Progress Updates: Patient sitting EOB with nsg present eating present on entrance to room. Pt given time to finish breakfast in order to improve PO intake. On return to room, patient alert and agreeable to PT session.   Patient reported no pain during session.  Therapeutic Activity: Bed Mobility: Pt sit to supine from EOB at end of session with supervision/modI. No VC required.  Transfers: Pt performed sit<>stand transfers throughout session with close supervision.   Gait Training:  Pt ambulated from room<>main gym using no AD (roughly 120' x 2) with CGA. Pt with slightly improved heel-toe on B LE's, and recalled previous cuing from therapists to increase step clearance (min cues to maintain after ambulating closer to destination). Pt also with CGA/close supervision for safety and slightly improved unsteadiness presentation to the L this session than in previous sessions (2 instances of L sway, but able to maintain standing balance).   Neuromuscular Re-ed: NMR facilitated during session with focus on L sided attention, dynamic balance, proprioceptive feedback. - Visual scanning for colored discs in hallway outside of main gym. PTA specifically called out colors that were on pt's L side. Pt required VC to increase awareness and to visually  track L side of hallway as pt would often walk past disc without turning visual field to the L. Pt with CGA/close supervision throughout for safety. Pt required one standing rest break with R UE support at nsg station. Pt with decreased B step clearance as pt began to fatigue, and required VC to increase step height. - Pt stepping to 6" step with 8lb ankle weight donned on L LE with R UE support on railing. Pt with VC to place entire foot on step, and to avoid sliding it off by increase hip flexion. Pt with CGA at beginning, then very light minA when beginning to fatigue due to L lean.   NMR performed for improvements in motor control and coordination, balance, sequencing, judgement, and self confidence/ efficacy in performing all aspects of mobility at highest level of independence.   Patient supine in bed at end of session with brakes locked, bed alarm set, and all needs within reach.      Therapy Documentation Precautions:  Precautions Precautions: Fall Precaution Comments: L inattention, cortrak Restrictions Weight Bearing Restrictions Per Provider Order: No  Therapy/Group: Individual Therapy  Donnica Jarnagin PTA 12/22/2023, 12:55 PM

## 2023-12-22 NOTE — Plan of Care (Signed)
  Problem: Consults Goal: RH STROKE PATIENT EDUCATION Description: See Patient Education module for education specifics  Outcome: Progressing   Problem: RH BOWEL ELIMINATION Goal: RH STG MANAGE BOWEL WITH ASSISTANCE Description: STG Manage Bowel with toileting Assistance. Outcome: Progressing Goal: RH STG MANAGE BOWEL W/MEDICATION W/ASSISTANCE Description: STG Manage Bowel with Medication with mod I Assistance. Outcome: Progressing   Problem: RH SAFETY Goal: RH STG ADHERE TO SAFETY PRECAUTIONS W/ASSISTANCE/DEVICE Description: STG Adhere to Safety Precautions With cues Assistance/Device. Outcome: Progressing   Problem: RH PAIN MANAGEMENT Goal: RH STG PAIN MANAGED AT OR BELOW PT'S PAIN GOAL Outcome: Progressing   Problem: RH KNOWLEDGE DEFICIT Goal: RH STG INCREASE KNOWLEDGE OF HYPERTENSION Description: Patient and family will be able to manage HTN using educational resources for medications and diet modification independently Outcome: Progressing Goal: RH STG INCREASE KNOWLEDGE OF DYSPHAGIA/FLUID INTAKE Description: Patient and family will be able to manage dysphagia using educational resources for medications and diet modification independently Outcome: Progressing Goal: RH STG INCREASE KNOWLEGDE OF HYPERLIPIDEMIA Description: Patient and family will be able to manage HLD using educational resources for medications and diet modification independently Outcome: Progressing Goal: RH STG INCREASE KNOWLEDGE OF STROKE PROPHYLAXIS Description: Patient and family will be able to manage secondary risks using educational resources for medications and diet modification independently Outcome: Progressing

## 2023-12-22 NOTE — Progress Notes (Signed)
Physical Therapy Session Note  Patient Details  Name: Ian Wong MRN: [id number] Date of Birth: 1961/01/21  Today's Date: 12/21/2023 PT Individual Time:  7829-5621  PT Individual Time Calculation (min): 45 min  Short Term Goals: Week 1:  PT Short Term Goal 1 (Week 1): Pt will complete bed mobility with supervision PT Short Term Goal 1 - Progress (Week 1): Met PT Short Term Goal 2 (Week 1): Pt will complete transfers CGA with LRAD PT Short Term Goal 2 - Progress (Week 1): Met PT Short Term Goal 3 (Week 1): Pt will ambulate >150' with CGA and LRAD PT Short Term Goal 3 - Progress (Week 1): Met PT Short Term Goal 4 (Week 1): Pt will complete gait up/down ramp with CGA PT Short Term Goal 4 - Progress (Week 1): Met Week 2:  PT Short Term Goal 1 (Week 2): Pt will complete transfers with consistent supervision and LRAD PT Short Term Goal 2 (Week 2): Pt will ambulate >300' with supervision and LRAD. PT Short Term Goal 3 (Week 2): Pt will improve score of Berg Balance Scale by at least 7 points in order to demonstrate progress. PT Short Term Goal 4 (Week 2): Pt will ambulate ramp with overall supervision.  Skilled Therapeutic Interventions/Progress Updates:  Patient seated upright on EOB with NT providing supervision over lunch meal on entrance to room. Patient alert and agreeable to PT session. Assumed care from NT.   Patient with no pain complaint at start of session.  Therapeutic Activity: Provided supervision and encouraged pt to continue to consume a few more bites of lunch. Pt does well to eat bites and then sip from nectar thick tea. Clears mouth after each bite. Pt willing to ambulate and mobilize then attempt to eat more.   Transfers: Pt performed sit<>stand with close supervision throughout session and stand pivot transfers with supervision/ CGA throughout session.  Gait Training/ NMR:  Pt ambulated 175 ft using no AD with light CGA. Demonstrated LOB x2 with need for ModA to  maintain balance. Pt demos delayed stepping strategy to maintain. Provided vc/ tc for heel-to-toe LLE advancement.   During ambulation, pt instructed in increasing pace, slowing pace, locating called objects in hallway especially on L side, and stepping over objects in gym both forward and laterally.   NMR performed for improvements in motor control and coordination, balance, sequencing, judgement, and self confidence/ efficacy in performing all aspects of mobility at highest level of independence.   On return to room, pt does not wish to eat more despite encouragement and reminder that eating D1 diet is exercise for mouth and throat in order to properly strengthen and coordinate to upgrade diet to D2 soon. Pt willing to drink Ensure but does not want lunch at this time.   Patient seated at EOB at end of session with brakes locked, bed alarm set, and all needs within reach.   Therapy Documentation Precautions:  Precautions Precautions: Fall Precaution Comments: L inattention, cortrak Restrictions Weight Bearing Restrictions Per Provider Order: No  Pain:  No pain indicated this session.      Therapy/Group: Individual Therapy  Loel Dubonnet PT, DPT, CSRS 12/21/2023, 5:50 PM

## 2023-12-23 LAB — GLUCOSE, CAPILLARY
Glucose-Capillary: 112 mg/dL — ABNORMAL HIGH (ref 70–99)
Glucose-Capillary: 97 mg/dL (ref 70–99)
Glucose-Capillary: 96 mg/dL (ref 70–99)
Glucose-Capillary: 89 mg/dL (ref 70–99)

## 2023-12-23 NOTE — Plan of Care (Signed)
  Problem: Consults Goal: RH STROKE PATIENT EDUCATION Description: See Patient Education module for education specifics  Outcome: Progressing   Problem: RH BOWEL ELIMINATION Goal: RH STG MANAGE BOWEL WITH ASSISTANCE Description: STG Manage Bowel with toileting Assistance. Outcome: Progressing Goal: RH STG MANAGE BOWEL W/MEDICATION W/ASSISTANCE Description: STG Manage Bowel with Medication with mod I Assistance. Outcome: Progressing   Problem: RH SAFETY Goal: RH STG ADHERE TO SAFETY PRECAUTIONS W/ASSISTANCE/DEVICE Description: STG Adhere to Safety Precautions With cues Assistance/Device. Outcome: Progressing   Problem: RH PAIN MANAGEMENT Goal: RH STG PAIN MANAGED AT OR BELOW PT'S PAIN GOAL Outcome: Progressing   Problem: RH KNOWLEDGE DEFICIT Goal: RH STG INCREASE KNOWLEDGE OF HYPERTENSION Description: Patient and family will be able to manage HTN using educational resources for medications and diet modification independently Outcome: Progressing Goal: RH STG INCREASE KNOWLEDGE OF DYSPHAGIA/FLUID INTAKE Description: Patient and family will be able to manage dysphagia using educational resources for medications and diet modification independently Outcome: Progressing Goal: RH STG INCREASE KNOWLEGDE OF HYPERLIPIDEMIA Description: Patient and family will be able to manage HLD using educational resources for medications and diet modification independently Outcome: Progressing Goal: RH STG INCREASE KNOWLEDGE OF STROKE PROPHYLAXIS Description: Patient and family will be able to manage secondary risks using educational resources for medications and diet modification independently Outcome: Progressing

## 2023-12-23 NOTE — Progress Notes (Signed)
PROGRESS NOTE   Subjective/Complaints:  Reviewed cal count ate >50% x 3 meals yesterday , no new issues overnite, states he drank 2 Ensures yesterday (~600 kcal) Feels a little congested but no SOB ROS: Limited due to cognitive/behavioral, but denied CP, SOB, abd pain, n/v/d/c  Objective:   No results found.  Recent Labs    12/21/23 0538  WBC 6.2  HGB 15.0  HCT 42.8  PLT 346    Recent Labs    12/21/23 0538  NA 133*  K 4.2  CL 99  CO2 24  GLUCOSE 117*  BUN 23  CREATININE 0.98  CALCIUM 8.7*          Intake/Output Summary (Last 24 hours) at 12/23/2023 0730 Last data filed at 12/22/2023 1900 Gross per 24 hour  Intake 592 ml  Output --  Net 592 ml        Physical Exam: Vital Signs Blood pressure 124/66, pulse 76, temperature 98.4 F (36.9 C), temperature source Oral, resp. rate 17, height 5\' 10"  (1.778 m), weight 96.6 kg, SpO2 98%.  General: No acute distress Mood and affect are appropriate Heart: Regular rate and rhythm no rubs murmurs or extra sounds Lungs: Clear to auscultation, breathing unlabored, no rales or wheezes, coarse rales on left clear with cough  Abdomen: Positive bowel sounds, soft nontender to palpation, nondistended Extremities: No clubbing, cyanosis, or edema Skin: No evidence of breakdown, no evidence of rash  Musculoskeletal: Full range of motion in all 4 extremities. No joint swelling   PRIOR EXAMS: Neurologic: Cranial nerves II through XII intact, motor strength is 5/5 in right  deltoid, bicep, tricep, grip, hip flexor, knee extensors, ankle dorsiflexor and plantar flexor 3 to 3+ LUE, 4- to 4/5 LLE  Musculoskeletal: reduced active left shoulder abduction  Assessment/Plan: 1. Functional deficits which require 3+ hours per day of interdisciplinary therapy in a comprehensive inpatient rehab setting. Physiatrist is providing close team supervision and 24 hour management of  active medical problems listed below. Physiatrist and rehab team continue to assess barriers to discharge/monitor patient progress toward functional and medical goals  Care Tool:  Bathing    Body parts bathed by patient: Chest, Abdomen, Buttocks, Front perineal area, Right upper leg, Left upper leg, Face, Left arm   Body parts bathed by helper: Right arm, Right lower leg, Left lower leg     Bathing assist Assist Level: Moderate Assistance - Patient 50 - 74%     Upper Body Dressing/Undressing Upper body dressing   What is the patient wearing?: Pull over shirt    Upper body assist Assist Level: Moderate Assistance - Patient 50 - 74%    Lower Body Dressing/Undressing Lower body dressing      What is the patient wearing?: Incontinence brief, Pants     Lower body assist Assist for lower body dressing: Moderate Assistance - Patient 50 - 74%     Toileting Toileting    Toileting assist Assist for toileting: Minimal Assistance - Patient > 75%     Transfers Chair/bed transfer  Transfers assist     Chair/bed transfer assist level: Contact Guard/Touching assist     Locomotion Ambulation   Ambulation assist  Assist level: Contact Guard/Touching assist Assistive device: No Device Max distance: 150'   Walk 10 feet activity   Assist     Assist level: Contact Guard/Touching assist Assistive device: No Device   Walk 50 feet activity   Assist    Assist level: Contact Guard/Touching assist Assistive device: No Device    Walk 150 feet activity   Assist    Assist level: Minimal Assistance - Patient > 75% Assistive device: No Device    Walk 10 feet on uneven surface  activity   Assist Walk 10 feet on uneven surfaces activity did not occur: Safety/medical concerns         Wheelchair     Assist Is the patient using a wheelchair?: Yes Type of Wheelchair: Manual    Wheelchair assist level: Dependent - Patient 0%      Wheelchair 50  feet with 2 turns activity    Assist        Assist Level: Dependent - Patient 0%   Wheelchair 150 feet activity     Assist      Assist Level: Dependent - Patient 0%   Blood pressure 124/66, pulse 76, temperature 98.4 F (36.9 C), temperature source Oral, resp. rate 17, height 5\' 10"  (1.778 m), weight 96.6 kg, SpO2 98%.  Medical Problem List and Plan: 1. 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 Formulized individual treatment plan based on medical history, underlying problem and comorbidities.  2.  Antithrombotics: -DVT/anticoagulation:  Pharmaceutical: Lovenox 40mg  daily             -antiplatelet therapy: Aspirin 81 mg daily and Brilinta 90 mg twice daily 3. Pain Management: Tylenol as needed 4. Mood/Behavior/Sleep: Provide emotional support  - Told me he regrets not taking care of himself earlier in his life -1/17. Begin lexapro 5mg  qhs             -neuropsych eval when provider is available             -antipsychotic agents: N/A 5. Neuropsych/cognition: This patient is capable of making decisions on his own behalf. 6. Skin/Wound Care: Routine skin checks 7. Fluids/Electrolytes/Nutrition: Routine in and outs with follow-up chemistries on Monday   BMET improved 1/17---push fluids, continue H2O flushes thru tube    Latest Ref Rng & Units 12/21/2023    5:38 AM 12/18/2023    7:37 AM 12/14/2023    5:05 AM  BMP  Glucose 70 - 99 mg/dL 413  244  010   BUN 8 - 23 mg/dL 23  26  29    Creatinine 0.61 - 1.24 mg/dL 2.72  5.36  6.44   Sodium 135 - 145 mmol/L 133   134  134   Potassium 3.5 - 5.1 mmol/L 4.2  4.3  3.8   Chloride 98 - 111 mmol/L 99  99  101   CO2 22 - 32 mmol/L 24  26  25    Calcium 8.9 - 10.3 mg/dL 8.7  8.8  8.6  8.  Dysphagia.  Dysphagia #1 nectar thick liquids.   -Continue nasogastric tube for nutritional support over weekend. Consider reducing rate of Osmolite to 40cc/hr to help stimulate PO intake.     -12/19/23 ate 25-50% of meals yesterday but 0% this AM-- asking for more frosted flakes and milk, encouraged to eat; of note, prednisone completed, could be contributing to less appetite but pt will try to eat more; CBGs fine -12/20/23 CBGs ok, ate 35-75% of meals yesterday, encouraged to continue 12/22/23- intake at 15-50% am meal generally lower, discuss repeat MBS with SLP in am conf CBG (last 3)  Recent Labs    12/22/23 1722 12/22/23 2104 12/23/23 0655  GLUCAP 77 117* 112*   9.  Hyperlipidemia.  Lipitor 20mg  daily 10.  GERD.  Nexium 20mg  daily 11.  Hypothyroidism.  Synthroid daily 12.  COPD exacerbation/respiratory distress.  Patient completed 5-day course of Unasyn.  Prednisone taper completed.   -pt on room air currently. 13. HTN?: unclear if this is a new diagnosis? No mention in notes. BPs elevated overnight. Monitor for now 1/21 controlled off meds  Vitals:   12/19/23 0502 12/19/23 1337 12/19/23 2021 12/20/23 0507  BP: 131/70 127/81 139/65 139/66   12/20/23 1326 12/20/23 1957 12/21/23 0429 12/21/23 1444  BP: 136/81 (!) 115/57 131/80 139/65   12/21/23 2041 12/22/23 0442 12/22/23 1926 12/23/23 0457  BP: 122/75 116/70 123/68 124/66    14. Bowel regimen:    -last bm 1/16 , type 4. Continue colace  -12/20/23 LBM yesterday, cont regimen LOS: 12 days A FACE TO FACE EVALUATION WAS PERFORMED  Erick Colace 12/23/2023, 7:30 AM

## 2023-12-23 NOTE — Progress Notes (Signed)
Occupational Therapy Session Note  Patient Details  Name: Ian Wong MRN: [id number] Date of Birth: [birth year]  Today's Date: 12/23/2023 OT Individual Time: 0915-1000 OT Individual Time Calculation (min): 45 min    Short Term Goals: Week 1:  OT Short Term Goal 1 (Week 1): pt will demonstrate improved left side awareness to don L arm and L leg in clothing first to follow through with hemi dressing strategies OT Short Term Goal 1 - Progress (Week 1): Met OT Short Term Goal 2 (Week 1): Pt will don shirt with min A to demonstrate improved memory of dressing techniques. OT Short Term Goal 2 - Progress (Week 1): Met OT Short Term Goal 3 (Week 1): Pt will use L hand to hold wash cloth and wash R arm with 50 % A. OT Short Term Goal 3 - Progress (Week 1): Met OT Short Term Goal 4 (Week 1): pt will ambulated to toilet with CGA with LRAD. OT Short Term Goal 4 - Progress (Week 1): Met Week 2:  OT Short Term Goal 1 (Week 2): pt will complete 1/3 toileting tasks with CGA OT Short Term Goal 2 (Week 2): pt will complete 2 grooming tasks at sink with CGA demonstrating improved activity tolerance for higher level ADLs OT Short Term Goal 3 (Week 2): pt will incorporate LUE into grooming tasks with < 3 verbal cues  Skilled Therapeutic Interventions/Progress Updates:    Pt received sitting EOB. The nurses had just removed his NG tube. Suggested to pt to go to sink to use warm wash clothes to cleanse his nose further.  Cued pt to use L hand to turn on and off warm water, L hand squeezes to wring water out of washcloth.  Pt stated he needed to use the toilet, distant Supervision to walk to toilet and use toilet.  Pt returned to sink to wash hands using L hand actively, cues to fully rinse soap off of L hand.    Pt cued to open door of room with L hand and he was able to do so without LOB.   Pt ambulated to ortho gym to work on arm bike with no resistance for forced use of L hand. Pt able to maintain grasp  on bike handle and turn bike with use of L arm only in a slow manner,  then with B hands in a faster manner with cues to go faster.  Forced use of L hand with bilateral hands grasping and releasing the handles of a wide laundry basket to lift and lower the basket while he was standing.  Cues for pt to fully open hand for pt to grasp handle more easily.   Set up tub room to simulate his home environment.  Pt has a garden step in tub.  Pt worked on sitting on edge and pivoting his legs in.  To come to stand he does need to pull up on a long grab bar. Recommended he have his family install a bar.  Once standing he pivoted to his R to sit on shower seat. Pt will also need a shower seat.   To get out of the tub,  pt reversed directions. He did need MAX cues to be able to do so has he had to turn to his left.  Pt had great difficulty with managing his body to turn and step to his left to be in a good position to sit back down safely.  Once sitting back down pt able to pivot  his feet to the right.    Pt then ambulated back to room with close supervision. Pt resting in bed, alarm on and all needs met.    Therapy Documentation Precautions:  Precautions Precautions: Fall Precaution Comments: L inattention, cortrak Restrictions Weight Bearing Restrictions Per Provider Order: No    Vital Signs: Therapy Vitals Temp: 98.4 F (36.9 C) Temp Source: Oral Pulse Rate: 76 Resp: 17 BP: 124/66 Oxygen Therapy SpO2: 98 % Pain:   ADL: ADL Eating: Minimal assistance Grooming: Minimal assistance Upper Body Bathing: Supervision/safety Where Assessed-Upper Body Bathing: Shower Lower Body Bathing: Contact guard Where Assessed-Lower Body Bathing: Shower Upper Body Dressing: Minimal assistance Where Assessed-Upper Body Dressing: Edge of bed Lower Body Dressing: Minimal assistance Where Assessed-Lower Body Dressing: Edge of bed Toileting: Minimal assistance Where Assessed-Toileting: Public house manager: Furniture conservator/restorer Method: Proofreader: Acupuncturist: Administrator, arts Method: Designer, industrial/product: Grab bars, Transfer tub bench   Therapy/Group: Individual Therapy  Shaundra Fullam 12/23/2023, 8:32 AM

## 2023-12-23 NOTE — Progress Notes (Signed)
NG tube removed per orders. Patient tolerated well.

## 2023-12-23 NOTE — Progress Notes (Signed)
Occupational Therapy Session Note  Patient Details  Name: Ian Wong MRN: [id number] Date of Birth: 1961/09/27  Today's Date: 12/23/2023 OT Individual Time: 2130-8657 OT Individual Time Calculation (min): 60 min    Short Term Goals: Week 2:  OT Short Term Goal 1 (Week 2): pt will complete 1/3 toileting tasks with CGA OT Short Term Goal 2 (Week 2): pt will complete 2 grooming tasks at sink with CGA demonstrating improved activity tolerance for higher level ADLs OT Short Term Goal 3 (Week 2): pt will incorporate LUE into grooming tasks with < 3 verbal cues  Skilled Therapeutic Interventions/Progress Updates:  Pt greeted supine in bed, pt agreeable to OT intervention.      Transfers/bed mobility/functional mobility: pt completed all functional ambulation with no AD and CGA, MIN verbal cues to attend to L side.   ADLs:  Grooming: standing grooming at sink with supervision, good integration of LUE into grooming tasks.   Transfers: ambulatory toilet transfer with no AD and CGA. Toileting: pt completed 3/3 toileting tasks with supervision   NMR: pt completed various NMR tasks for LUE to facilitate improved L attention, visual scanning, and improved motor planning and FMC in LUE.  -pt instructed to grasp bean bags on R side with LUE and reach across midline to place bean bags in hoop with an emphasis on LUE motor control and scanning to L side to improve L sided attention. Pt completed task with supervision for balance and MIN verbal cues for LUE grasping technique.  -pt instructed to stand in // bars with LUE positioned on large therapy ball in between bars with pt instructed to roll ball forward/backwards with an emphasis on providing improved LUE scapular protraction/retraction and provide neuromuscular reeducation to LUE. - graded task up and had pt step over obstacles while rolling ball forward/backwards in between bars. Pt needed support from bars on R side and CGA for balance.  Decreased knee flexion noted when stepping over with LLE.  -pt completed sit>stands in // bars with BUEs positioned on large therapy ball and pt pushing into ball to power into standing, pt completed x10 reps with CGA -pt able to ambulate greater than a household distance while holding compliant beach ball in between BUEs to provide NMR to LUE and increase L sided visual attention.   Ended session with pt supine in bed with all needs within reach and bed alarm activated.                    Therapy Documentation Precautions:  Precautions Precautions: Fall Precaution Comments: L inattention, cortrak Restrictions Weight Bearing Restrictions Per Provider Order: No  Pain: No pain reported during session    Therapy/Group: Individual Therapy  Barron Schmid 12/23/2023, 12:31 PM

## 2023-12-23 NOTE — Progress Notes (Signed)
Physical Therapy Session Note  Patient Details  Name: Ian Wong MRN: [id number] Date of Birth: December 15, 1960  Today's Date: 12/23/2023 PT Individual Time: 0805-0905; 1410 - 1510  PT Individual Time Calculation (min): 60 min; 58 min   Short Term Goals: Week 2:  PT Short Term Goal 1 (Week 2): Pt will complete transfers with consistent supervision and LRAD PT Short Term Goal 2 (Week 2): Pt will ambulate >300' with supervision and LRAD. PT Short Term Goal 3 (Week 2): Pt will improve score of Berg Balance Scale by at least 7 points in order to demonstrate progress. PT Short Term Goal 4 (Week 2): Pt will ambulate ramp with overall supervision.  SESSION 1 Skilled Therapeutic Interventions/Progress Updates: Patient sitting EOB on entrance to room. Patient alert and agreeable to PT session.   Patient reported no pain during session  Therapeutic Activity: Transfers: Pt performed sit<>stand transfers throughout session with close supervision for safety and no AD.  - Pt sitting EOB donning personal shoes with supervision and VC to adjust tongue on L show. Pt with increased time to perform task with velcro straps. Pt demonstrated anterior weight shift without LOB.  Gait Training:  Pt ambulated ambulated from room<>main gym (roughly 120' x 2) using no AD with CGA for safety. Pt with flat foot at first, then increased to heel-strike and increase in clearance B LE after VC provided. Pt also with VC to increase awareness to L side.   Neuromuscular Re-ed: NMR facilitated during session with focus on dynamic standing balance, neuromuscular connection/coordination, attention to task.   Interventions below performed in main gym with moderate distractions. Pt required increased cues to stay on task due to other therapy sessions going on in environment.  - Pt moving squigs from table to mirror to replicate smiling face. Pt cued to turn to the L only (starting by looking to the L, then turning body). Pt  required VC at beginning to recall sequence. Pt also with decrease in weight shift to the R with tactile and VC to lean accordingly in order to increase L LE clearance with education and demonstration also provided (pt seemingly understood). Pt lined up bottom squigs to mouth in linear fashion vs smiling. Pt cued to replicate smile to original with pt stating "maybe he has nothing to smile about." Pt performed 2nd round with added focus and cues on pivoting L LE posteriorly/laterally to the L with pt showing difficulty in mechanics required (pt stepping anteriorly/laterally to the L only). Pt did, however, show decrease in VC required to weight shift to the R on 2nd round vs 1st.  - Pt with yellow theraband tied around bottom of // bar and L LE at ankle. Orange tapes placed on neutral stance position, and posteriorly/laterally to the L. Pt cued to pivot L LE to secondary tape by picking up clearance threshold, and externally rotating L LE. Pt with light minA for safety, and tactile cues to weight shift to the R when advancing L LE. Pt with noted increase in stepping to target, but still required max multimodal cues to maintain.  NMR performed for improvements in motor control and coordination, balance, sequencing, judgement, and self confidence/ efficacy in performing all aspects of mobility at highest level of independence.   Patient in [facility] with hand off to nsg changing linens in room at end of session with brakes locked.   SESSION 2 Skilled Therapeutic Interventions/Progress Updates: Patient supine in bed on entrance to room. Patient alert and agreeable to  PT session.   Patient reported no pain and with Cortrak removed.  Therapeutic Activity: Transfers: Pt performed sit<>stand transfers throughout session with no AD and with supervision.   Gait Training:  Pt ambulated from room<>main gym using no AD with light CGA. Pt required VC to perform heel-strike (questioning cues) and questioning cues as to  what pt could have done to prevent bumping into edge of mat with pt saying "moving to the right," with added question of what else could have he done with pt following up with "looking down at it."  Neuromuscular Re-ed: NMR facilitated during session with focus on dynamic standing balance, proprioceptive feedback, weight shifting, attention to task. - Pt with yellow theraband tied around bottom of // bar and L LE at ankle. Orange tapes placed on neutral stance position, and posteriorly/laterally to the L. Pt cued to pivot L LE to secondary tape by increasing step clearance, and externally rotating L LE. Pt with CGA for safety, and tactile cues to weight shift to the R when advancing L LE. Pt required max cues with manual facilitation of movement with noted increase to distance, but unable to maintain longer than 1-2 reps after. Pt then cued to perform pure retro step with L LE only and band still donned with VC to increase height of step with seated rest break to follow per pt request. 2nd round performed with only retro step at first, then back to pivot step with same cues and slightly improved clearance/distance. Pt with another rest break with VC to perform pivot step to the L to turn with one good carry-over noted.  - Pt performed lateral step to 2" step with L LE and 5lb ankle weight donned on 1st round, and 8lb on 2nd. Sets performed till close to fatigue. External cue of PTA foot on front of step for pt to avoid hitting as to decrease hip flexor compensation.  - Lateral steps in front of mirror by // bar with VC to increase weight shifting accordingly, and to keep neutral alignment of B LE to avoid compensating with hip flexors. Pt with close supervision for safety  - Pt then performed 360*turns to the L. Pt with slight increase in step clearance and pivot vs earlier in day/session, but still required VC to maintain.  Pt easily distactable throughout session (minimally distracting environment at  first, the moderately as therapy session increased in main gym that required max cues for pt to stay on task.   NMR performed for improvements in motor control and coordination, balance, sequencing, judgement, and self confidence/ efficacy in performing all aspects of mobility at highest level of independence.   Patient left on toilet with nsg notified for handover at end of session.       Therapy Documentation Precautions:  Precautions Precautions: Fall Precaution Comments: L inattention, cortrak Restrictions Weight Bearing Restrictions Per Provider Order: No   Therapy/Group: Individual Therapy  Paola Flynt PTA 12/23/2023, 10:00 AM

## 2023-12-23 NOTE — Progress Notes (Signed)
Patient ID: Ian Wong, male   DOB: 1961/02/12, 64 y.o.   MRN: [id number]  [id number] spoke with pt wife [name], MSW, LCSW Office: [phone] Cell: [phone] Fax: [phone]

## 2023-12-24 LAB — GLUCOSE, CAPILLARY
Glucose-Capillary: 103 mg/dL — ABNORMAL HIGH (ref 70–99)
Glucose-Capillary: 119 mg/dL — ABNORMAL HIGH (ref 70–99)
Glucose-Capillary: 104 mg/dL — ABNORMAL HIGH (ref 70–99)
Glucose-Capillary: 69 mg/dL — ABNORMAL LOW (ref 70–99)
Glucose-Capillary: 98 mg/dL (ref 70–99)

## 2023-12-24 NOTE — Progress Notes (Signed)
Hypoglycemic Event     CBG: 69   Treatment: 4 oz juice/soda  Symptoms: None  Follow-up CBG: Time:1720  CBG Result:104  Possible Reasons for Event: Inadequate meal intake  Comments/MD notified:PA notifed;dinner arriving shortly serve immediately     Missy Baksh ATKINSO

## 2023-12-24 NOTE — Patient Care Conference (Signed)
Inpatient RehabilitationTeam Conference and Plan of Care Update Date: 12/23/2023   Time: 10:04 AM    Patient Name: Ian Wong      Medical Record Number: [id number]  Date of Birth: 11-Mar-1961 Sex: Male         Room/Bed: 4M12C/4M12C-01 Payor Info: Payor: [place] MEDICAID PREPAID HEALTH PLAN / Plan:  MEDICAID [employer] / Product Type: *No Product type* /    Admit Date/Time:  12/11/2023  2:45 PM  Primary Diagnosis:  Right middle cerebral artery stroke [facility])  Hospital Problems: Principal Problem:   Right middle cerebral artery stroke (HCC) Active Problems:   Dysphagia, post-stroke   Spastic hemiparesis 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 [facility])    Expected Discharge Date: Expected Discharge Date: 12/31/23  Team Members Present: Physician leading conference: Dr. Claudette Laws Social Worker Present: Cecile Sheerer, LCSWA Nurse Present: Chana Bode, RN;Novice Vrba Trilby Drummer, RN PT Present: Ralph Leyden, PT OT Present: Primitivo Gauze, OT SLP Present: Pablo Lawrence, SLP PPS Coordinator present : Fae Pippin, SLP     Current Status/Progress Goal Weekly Team Focus  Bowel/Bladder   continent of b/b [facility] 1/20   maintain continence   offer toileting qshift and PRN    Swallow/Nutrition/ Hydration   D1/ NTL- trialed D2 meal yesterday with min lingual residue which pt cleared indep   Mod I  pharyngeal strengthening, trial tray, carryover of strategies, additional D2 tray before upgrade    ADL's   pt is making excellent progress, developing improved L side awareness and LUE use.  overall min A with toileting, CGA bathing, min A dressing with min cues for L side awareness   supervision   ADL training, LUE NMR, L side awareness, balance, pt education    Mobility   Bed mobility = Mod I, sit<>stand = supervision, stand pivot = CGA, ambulation = no AD with CGA up to MinA with faitgue d/t L lean/ LOB   overall supervision   Barriers: L inattn, L hemibody awareness UE>LE /// Work on: L hemibody NMR, standing balance, L sided strengthening, reducing LOA in mobility, family educ    Communication   90% intelligible at conversation level given good positioning, flat affect   mod I   implement and carryover of intelligibility strategies    Safety/Cognition/ Behavioral Observations  min A- memory, problem solving, awareness   supervision   memory strategies, attention to L, mild complexity problem solving    Pain   no c/o pain   maintain pain free   assess pain qshift and PRN    Skin   right thigh incision   maintain infection free incision  assess skin for s/s of infection qshift and PRN      Discharge Planning:  Pt will d/c to home with his wife who will be his primary caregiver. pt needs to be Mod I to supervision as his wife cares for their disabled son. PCS referral submitted to insurance.  SW will confirm there are no barriers to discharge.   Team Discussion: Post right middle cerebral artery stroke. Inattention, blood pressures, and hydration status improving. Tolerating D1 nectar thick liquids with trials of D2 diet. Therapies working on ADL training, left sided awareness and functional movement/motor return to LUE.  Will need grab bar in shower at home. Noticing an increase in left lean with fatigue during gait with LOB.  Working on standing balance, strengthening, and ongoing family education. ST working on carryover of swallowing strategies, memory strategies, inattention to  the left, and complex problem solving. May remain on nectar thick liquids longer due to immediate cough with thin liquids.  Patient on target to meet rehab goals: yes, patient is progressing well with discharge date of 12/31/2023  *See Care Plan and progress notes for long and short-term goals.   Revisions to Treatment Plan:  Discontinue supplemental tube feeds.  PCS referral. Encourage fluids and caloric intake. Monitor  labs and VS.   Teaching Needs: Medications, safety, self care, diet modifications, transfers, toileting, incision care, etc.   Current Barriers to Discharge: Decreased caregiver support, Wound care, and Weight  Possible Resolutions to Barriers: Family education Healed incision Adhere to prescribed diet/textures to reduce the risk of aspiration, weight control and improve health overall.  Order recommended DME     Medical Summary Current Status: supplemental TF, encouraged fluid and caloric intake, left neglect , slowed ognitive processing  Barriers to Discharge: Medical stability;Inadequate Nutritional Intake   Possible Resolutions to [employer]: d/c NGT trial po feeds only , address left neglect and safety   Continued Need for Acute Rehabilitation Level of Care: The patient requires daily medical management by a physician with specialized training in physical medicine and rehabilitation for the following reasons: Direction of a multidisciplinary physical rehabilitation program to maximize functional independence : Yes Medical management of patient stability for increased activity during participation in an intensive rehabilitation regime.: Yes Analysis of laboratory values and/or radiology reports with any subsequent need for medication adjustment and/or medical intervention. : Yes   I attest that I was present, lead the team conference, and concur with the assessment and plan of the team.   Jearld Adjutant 12/24/2023, 9:43 AM

## 2023-12-24 NOTE — Progress Notes (Signed)
PROGRESS NOTE   Subjective/Complaints:  NGT pulled ate 30-50% meals plus 2 ensure  ROS: Limited due to cognitive/behavioral, but denied CP, SOB, abd pain, n/v/d/c  Objective:   No results found.  No results for input(s): "WBC", "HGB", "HCT", "PLT" in the last 72 hours.   No results for input(s): "NA", "K", "CL", "CO2", "GLUCOSE", "BUN", "CREATININE", "CALCIUM" in the last 72 hours.         Intake/Output Summary (Last 24 hours) at 12/24/2023 0725 Last data filed at 12/23/2023 1753 Gross per 24 hour  Intake 480 ml  Output --  Net 480 ml        Physical Exam: Vital Signs Blood pressure 131/66, pulse 81, temperature 98.2 F (36.8 C), resp. rate 17, height 5\' 10"  (1.778 m), weight 96.6 kg, SpO2 97%.  General: No acute distress Mood and affect are appropriate Heart: Regular rate and rhythm no rubs murmurs or extra sounds Lungs: Clear to auscultation, breathing unlabored, no rales or wheezes, coarse rales on left clear with cough  Abdomen: Positive bowel sounds, soft nontender to palpation, nondistended Extremities: No clubbing, cyanosis, or edema Skin: No evidence of breakdown, no evidence of rash  Musculoskeletal: Full range of motion in all 4 extremities. No joint swelling   PRIOR EXAMS: Neurologic: Cranial nerves II through XII intact, motor strength is 5/5 in right  deltoid, bicep, tricep, grip, hip flexor, knee extensors, ankle dorsiflexor and plantar flexor 3 to 3+ LUE, 4- to 4/5 LLE  Musculoskeletal: reduced active left shoulder abduction  Assessment/Plan: 1. Functional deficits which require 3+ hours per day of interdisciplinary therapy in a comprehensive inpatient rehab setting. Physiatrist is providing close team supervision and 24 hour management of active medical problems listed below. Physiatrist and rehab team continue to assess barriers to discharge/monitor patient progress toward functional and  medical goals  Care Tool:  Bathing    Body parts bathed by patient: Chest, Abdomen, Buttocks, Front perineal area, Right upper leg, Left upper leg, Face, Left arm   Body parts bathed by helper: Right arm, Right lower leg, Left lower leg     Bathing assist Assist Level: Moderate Assistance - Patient 50 - 74%     Upper Body Dressing/Undressing Upper body dressing   What is the patient wearing?: Pull over shirt    Upper body assist Assist Level: Moderate Assistance - Patient 50 - 74%    Lower Body Dressing/Undressing Lower body dressing      What is the patient wearing?: Incontinence brief, Pants     Lower body assist Assist for lower body dressing: Moderate Assistance - Patient 50 - 74%     Toileting Toileting    Toileting assist Assist for toileting: Minimal Assistance - Patient > 75%     Transfers Chair/bed transfer  Transfers assist     Chair/bed transfer assist level: Contact Guard/Touching assist     Locomotion Ambulation   Ambulation assist      Assist level: Contact Guard/Touching assist Assistive device: No Device Max distance: 150'   Walk 10 feet activity   Assist     Assist level: Contact Guard/Touching assist Assistive device: No Device   Walk 50 feet activity  Assist    Assist level: Contact Guard/Touching assist Assistive device: No Device    Walk 150 feet activity   Assist    Assist level: Minimal Assistance - Patient > 75% Assistive device: No Device    Walk 10 feet on uneven surface  activity   Assist Walk 10 feet on uneven surfaces activity did not occur: Safety/medical concerns         Wheelchair     Assist Is the patient using a wheelchair?: Yes Type of Wheelchair: Manual    Wheelchair assist level: Dependent - Patient 0%      Wheelchair 50 feet with 2 turns activity    Assist        Assist Level: Dependent - Patient 0%   Wheelchair 150 feet activity     Assist      Assist  Level: Dependent - Patient 0%   Blood pressure 131/66, pulse 81, temperature 98.2 F (36.8 C), resp. rate 17, height 5\' 10"  (1.778 m), weight 96.6 kg, SpO2 97%.  Medical Problem List and Plan: 1. 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  Antithrombotics: -DVT/anticoagulation:  Pharmaceutical: Lovenox 40mg  daily             -antiplatelet therapy: Aspirin 81 mg daily and Brilinta 90 mg twice daily 3. Pain Management: Tylenol as needed 4. Mood/Behavior/Sleep: Provide emotional support  - Told me he regrets not taking care of himself earlier in his life -1/17. Begin lexapro 5mg  qhs             -neuropsych eval when provider is available             -antipsychotic agents: N/A 5. Neuropsych/cognition: This patient is capable of making decisions on his own behalf. 6. Skin/Wound Care: Routine skin checks 7. Fluids/Electrolytes/Nutrition: Routine in and outs with follow-up chemistries on Monday   BMET improved 1/20---push fluids, continue H2O flushes thru tube    Latest Ref Rng & Units 12/21/2023    5:38 AM 12/18/2023    7:37 AM 12/14/2023    5:05 AM  BMP  Glucose 70 - 99 mg/dL 401  027  253   BUN 8 - 23 mg/dL 23  26  29    Creatinine 0.61 - 1.24 mg/dL 6.64  4.03  4.74   Sodium 135 - 145 mmol/L 133  134  134   Potassium 3.5 - 5.1 mmol/L 4.2  4.3  3.8   Chloride 98 - 111 mmol/L 99  99  101   CO2 22 - 32 mmol/L 24  26  25    Calcium 8.9 - 10.3 mg/dL 8.7  8.8  8.6    8.  Dysphagia.  Dysphagia #1 nectar thick liquids.   -SLP in team conf, rec repeat D2 trial today? CBG (last 3)  Recent Labs    12/23/23 1630 12/23/23 2047 12/24/23 0519  GLUCAP 96 89 103*   9.  Hyperlipidemia.  Lipitor 20mg  daily 10.  GERD.  Nexium  20mg  daily 11.  Hypothyroidism.  Synthroid daily 12.  COPD exacerbation/respiratory distress.  Patient completed 5-day course of Unasyn.  Prednisone taper completed.   -pt on room air currently. 13. HTN?: unclear if this is a new diagnosis? No mention in notes. BPs elevated overnight. Monitor for now 1/21 controlled off meds  Vitals:   12/20/23 0507 12/20/23 1326 12/20/23 1957 12/21/23 0429  BP: 139/66 136/81 (!) 115/57 131/80   12/21/23 1444 12/21/23 2041 12/22/23 0442 12/22/23 1926  BP: 139/65 122/75 116/70 123/68   12/23/23 0457 12/23/23 1309 12/23/23 1924 12/24/23 0513  BP: 124/66 (!) 157/77 131/63 131/66    14. Bowel regimen:    -last bm 1/16 , type 4. Continue colace  -12/20/23 LBM yesterday, cont regimen LOS: 13 days A FACE TO FACE EVALUATION WAS PERFORMED  Erick Colace 12/24/2023, 7:25 AM

## 2023-12-24 NOTE — Progress Notes (Signed)
Physical Therapy Session Note  Patient Details  Name: Ian Wong MRN: [id number] Date of Birth: 1961/02/27  Today's Date: 12/24/2023 PT Individual Time: 1442-1540 PT Individual Time Calculation (min): 58 min   Short Term Goals: Week 2:  PT Short Term Goal 1 (Week 2): Pt will complete transfers with consistent supervision and LRAD PT Short Term Goal 2 (Week 2): Pt will ambulate >300' with supervision and LRAD. PT Short Term Goal 3 (Week 2): Pt will improve score of Berg Balance Scale by at least 7 points in order to demonstrate progress. PT Short Term Goal 4 (Week 2): Pt will ambulate ramp with overall supervision.  Skilled Therapeutic Interventions/Progress Updates: Patient supine in bed with family present on entrance to room. Patient alert and agreeable to PT session.   Patient reported no pain. Pt family updated on pt progress at beginning of session. Pt family further updated on today's session focus listed below with no further follow up questions.   Therapeutic Activity: Bed Mobility: Pt performed supine<sit on EOB with supervision/modI. Transfers: Pt performed sit<>stand transfers throughout session with no AD and with supervision. Provided VC for pt to increase clearance and length of L LE (improved as session progressed following NMRE). Pt transported to [employer] in [facility] dependently for energy conservation   Neuromuscular Re-ed: NMR facilitated during session with focus on dynamic balance, proprioceptive feedback, neuromuscular connection/coordination. - Pt ambulated in N tower hallway for change in scenery, and to decrease distractions in environment. Pt performed multi-directional stepping with emphasis on pivoting to the L, and making 360* turns to the L with VC to increase posterior lateral step with L vs circumducting and stepping laterally. Pt with improved sequence after performing multiple bouts of lateral steps and pivots with one rest break (external cuing of pt  stepping to target as well - PTA foot). VC that worked well for pt is "step L LE straight back" with pt stepping to desired placement for pivot carry-over, and improved synergistic function of L LE musculature. Pt provided with rest breaks prn  - Pt on NuStep on level 6 resistance with instructions to maintain 45-50 steps per minute. 350 steps performed in 8 min with pt requiring one rest break (no symptoms of SOB, only some fatigue on LE's).  NMR performed for improvements in motor control and coordination, balance, sequencing, judgement, and self confidence/ efficacy in performing all aspects of mobility at highest level of independence.   Patient sitting EOB at end of session with brakes locked, family present, bed alarm set, and all needs within reach.      Therapy Documentation Precautions:  Precautions Precautions: Fall Precaution Comments: L inattention, cortrak Restrictions Weight Bearing Restrictions Per Provider Order: No  Therapy/Group: Individual Therapy  Antoine Vandermeulen PTA 12/24/2023, 4:05 PM

## 2023-12-24 NOTE — Progress Notes (Signed)
Patient ID: Ian Wong, male   DOB: [birth year], 62 y.o.   MRN: [id number]  SW sent [facility] referral to [facility] for HHPT/OT/aide and waiting on follow-up.   Cecile Sheerer, MSW, LCSW Office: [phone] Cell: [phone] Fax: [phone]

## 2023-12-24 NOTE — Progress Notes (Addendum)
Occupational Therapy Session Note  Patient Details  Name: Ian Wong MRN: [id number] Date of Birth: 1961/01/09  Today's Date: 12/24/2023 OT Individual Time: 4098-1191 OT Individual Time Calculation (min): 75 min    Short Term Goals: Week 1:  OT Short Term Goal 1 (Week 1): pt will demonstrate improved left side awareness to don L arm and L leg in clothing first to follow through with hemi dressing strategies OT Short Term Goal 1 - Progress (Week 1): Met OT Short Term Goal 2 (Week 1): Pt will don shirt with min A to demonstrate improved memory of dressing techniques. OT Short Term Goal 2 - Progress (Week 1): Met OT Short Term Goal 3 (Week 1): Pt will use L hand to hold wash cloth and wash R arm with 50 % A. OT Short Term Goal 3 - Progress (Week 1): Met OT Short Term Goal 4 (Week 1): pt will ambulated to toilet with CGA with LRAD. OT Short Term Goal 4 - Progress (Week 1): Met Week 2:  OT Short Term Goal 1 (Week 2): pt will complete 1/3 toileting tasks with CGA OT Short Term Goal 2 (Week 2): pt will complete 2 grooming tasks at sink with CGA demonstrating improved activity tolerance for higher level ADLs OT Short Term Goal 3 (Week 2): pt will incorporate LUE into grooming tasks with < 3 verbal cues  Skilled Therapeutic Interventions/Progress Updates:  Pt received in bed ready to shower.  Pt cued to find clothing and gather supplies. Pt ambulated in room with close supervision and needed cues to open bottom drawers when he could not find clothing in top drawer.  Had pt use L hand as active A to hold clothing as he pulled each piece out.  He then ambulated to the shower, doffed clothing in standing using grab bar for support with his balance as he stepped out of pants.  In shower bathed self with cues to ensure he fully washed left arm as he neglected it initially.  Pt stood for the majority of the shower, sat to wash feet.  After drying off, walked to bed to dress. When he donned shirt the  first time, he completely forgot to put L arm in sleeve and had shirt on the rest of the way before realizing the error.  Pt self corrected without A but with on cue to acknowledge hand.  Donned pants and socks/shoes with supervision only.   Standing at sink to brush teeth, using L hand as an active A.   Worked on having pt don gait belt himself with holding one end in L hand and reaching L hand behind back, pt able to do so but then could not maintain grasp once hand was behind his back. Attempted 2x.   Instead had pt hold end in front of body as therapist pulled belt around his back and then he used R and L hands together to fasten belt.  Pt ambulated to gym to work on L visual scanning and LUE NMR.  Pt able to pull door open with his L hand.  BITS bell cancellation with extra time and mod cues to scan to left. Had pt use frame of BITS as reference and pt cued to look to left end of frame and then scan left to right. Pt had very scattered pattern of scanning, only looked at bells in center of screen and with cues found the rest of the items.  His scanning was very criss crossed.  With cues  pt able to follow L to R pattern as he would if he was reading. Pt stood for 10+ min then requested to sit.  In sitting on mat, he worked on Holding cup in L hand and working on pouring out imaginary water into another cup for forearm mobility and then reaching arm out to receive water from a "server" as if he was at [employer] to challenge his L visual scanning and shoulder mobility.  Excellent participation and physical improvement but pt continues to have severe perceptual/ processing deficits.     Pt resting in bed with all needs met. Alarm set and call light in reach.    Therapy Documentation Precautions:  Precautions Precautions: Fall Precaution Comments: L inattention, cortrak Restrictions Weight Bearing Restrictions Per Provider Order: No    Pain:  No c/o pain    Therapy/Group: Individual  Therapy  Kristena Wilhelmi 12/24/2023, 12:56 PM

## 2023-12-24 NOTE — Progress Notes (Signed)
Occupational Therapy Session Note  Patient Details  Name: Ian Wong MRN: [id number] Date of Birth: 02-05-1961  Today's Date: 12/24/2023 OT Individual Time: 1308-6578 OT Individual Time Calculation (min): 74 min    Short Term Goals: Week 2:  OT Short Term Goal 1 (Week 2): pt will complete 1/3 toileting tasks with CGA OT Short Term Goal 2 (Week 2): pt will complete 2 grooming tasks at sink with CGA demonstrating improved activity tolerance for higher level ADLs OT Short Term Goal 3 (Week 2): pt will incorporate LUE into grooming tasks with < 3 verbal cues  Skilled Therapeutic Interventions/Progress Updates:  Pt greeted supine in bed, pt agreeable to OT intervention.      Transfers/bed mobility/functional mobility: pt completed supine>sit with supervision. Ambulatory transfer with no AD and supervision.   Therapeutic activity: pt completed various thereapeutic activities focused on L sided attention, LUE coordination/FMC:  -pt instructed to walk up/down ramp ( this is pts home entrance) to match sticky notes to mirror with a focus on improving visual scanning to L side and FMC in L hand for ADL participation. Pt required step by step cues to place hand in correct orientation to effectively grasp sticky notes. Pt also required MIN verbal cues to attend to L side duirng ambulation as pt running into car on L side when coming down ramp.  -pt completed line bisection assessment with pt noted to begin task on bottom R quadrant with pt noted to miss one line in bottom L quadrant, suspect pt has L lower quadrant field cut, educated pt on field cut. -worked on further visual scanning/attention in functional mobility context with pt ambulating down hallway to locate stimulus in lower L quadrant. Pt able to locate 5/6 with no cues, increased difficulty motor planning grasping stimulus with LUE needing step by step cues to pornate/supinate wrist appropriately to grasp items.  -pt assisted therapist  with washing squigz with a focus on bimanual tasks with pt holding item in L hand with R hand washing item, pt completed task with supervision with MIN verbal cues for hand positioning and motor planning. Suggested pt carryover bimanual tasks at home such as drying/washing dishes, pt agreeable.   ADLs:  Grooming: pt completed standing hand hygiene with supervision with a focus on using BUEs to wash hands.  Footwear: pt able to don shoes with set- up assist with + time.    Transfers: ambulatory transfer into bathroom with no AD and supervision.  Toileting: continent urine void x2 with pt completing 3/3 toileting tasks with supervision.    Ended session with pt supine in bed with all needs within reach and bed alarm activated.                   Therapy Documentation Precautions:  Precautions Precautions: Fall Precaution Comments: L inattention, cortrak Restrictions Weight Bearing Restrictions Per Provider Order: No  Pain: Unrated pain reported in LLE, rest breaks provided as needed.   Therapy/Group: Individual Therapy  Pollyann Glen [facility] 12/24/2023, 9:01 AM

## 2023-12-25 LAB — GLUCOSE, CAPILLARY
Glucose-Capillary: 111 mg/dL — ABNORMAL HIGH (ref 70–99)
Glucose-Capillary: 94 mg/dL (ref 70–99)
Glucose-Capillary: 112 mg/dL — ABNORMAL HIGH (ref 70–99)
Glucose-Capillary: 88 mg/dL (ref 70–99)

## 2023-12-25 NOTE — Progress Notes (Addendum)
Patient ID: Kyal Arts, male   DOB: [birth year], 63 y.o.   MRN: [id number]  [facility] able to accept referral for HHPT/OT/SLP/aide.   1343- SW returned phone call to pt wife to inform no knowledge on contact for social security and will need to wait for documents to arrive at the home. SW informed on above about [facility]HA. SW encouraged follow-up if needed.   Cecile Sheerer, MSW, LCSW Office: [phone] Cell: [phone] Fax: [phone]

## 2023-12-25 NOTE — Progress Notes (Signed)
PROGRESS NOTE   Subjective/Complaints:  No issues overnite, discussed D2 trial with SLP   Pt still spits out oral secretions  ROS: Limited due to cognitive/behavioral, but denied CP, SOB, abd pain, n/v/d/c  Objective:   No results found.  No results for input(s): "WBC", "HGB", "HCT", "PLT" in the last 72 hours.   No results for input(s): "NA", "K", "CL", "CO2", "GLUCOSE", "BUN", "CREATININE", "CALCIUM" in the last 72 hours.         Intake/Output Summary (Last 24 hours) at 12/25/2023 0729 Last data filed at 12/24/2023 1700 Gross per 24 hour  Intake 237 ml  Output --  Net 237 ml        Physical Exam: Vital Signs Blood pressure (!) 120/58, pulse 77, temperature 98.4 F (36.9 C), resp. rate 18, height 5\' 10"  (1.778 m), weight 96.6 kg, SpO2 97%.  General: No acute distress Mood and affect are appropriate Heart: Regular rate and rhythm no rubs murmurs or extra sounds Lungs: Clear to auscultation, breathing unlabored, no rales or wheezes, coarse rales on left clear with cough  Abdomen: Positive bowel sounds, soft nontender to palpation, nondistended Extremities: No clubbing, cyanosis, or edema Skin: No evidence of breakdown, no evidence of rash  Musculoskeletal: Full range of motion in all 4 extremities. No joint swelling   PRIOR EXAMS: Neurologic: Cranial nerves II through XII intact, motor strength is 5/5 in right  deltoid, bicep, tricep, grip, hip flexor, knee extensors, ankle dorsiflexor and plantar flexor 3 to 3+ LUE, 4- to 4/5 LLE  Musculoskeletal: reduced active left shoulder abduction  Assessment/Plan: 1. Functional deficits which require 3+ hours per day of interdisciplinary therapy in a comprehensive inpatient rehab setting. Physiatrist is providing close team supervision and 24 hour management of active medical problems listed below. Physiatrist and rehab team continue to assess barriers to  discharge/monitor patient progress toward functional and medical goals  Care Tool:  Bathing    Body parts bathed by patient: Chest, Abdomen, Buttocks, Front perineal area, Right upper leg, Left upper leg, Face, Left arm   Body parts bathed by helper: Right arm, Right lower leg, Left lower leg     Bathing assist Assist Level: Moderate Assistance - Patient 50 - 74%     Upper Body Dressing/Undressing Upper body dressing   What is the patient wearing?: Pull over shirt    Upper body assist Assist Level: Moderate Assistance - Patient 50 - 74%    Lower Body Dressing/Undressing Lower body dressing      What is the patient wearing?: Incontinence brief, Pants     Lower body assist Assist for lower body dressing: Moderate Assistance - Patient 50 - 74%     Toileting Toileting    Toileting assist Assist for toileting: Minimal Assistance - Patient > 75%     Transfers Chair/bed transfer  Transfers assist     Chair/bed transfer assist level: Contact Guard/Touching assist     Locomotion Ambulation   Ambulation assist      Assist level: Contact Guard/Touching assist Assistive device: No Device Max distance: 150'   Walk 10 feet activity   Assist     Assist level: Contact Guard/Touching assist Assistive device:  No Device   Walk 50 feet activity   Assist    Assist level: Contact Guard/Touching assist Assistive device: No Device    Walk 150 feet activity   Assist    Assist level: Minimal Assistance - Patient > 75% Assistive device: No Device    Walk 10 feet on uneven surface  activity   Assist Walk 10 feet on uneven surfaces activity did not occur: Safety/medical concerns         Wheelchair     Assist Is the patient using a wheelchair?: Yes Type of Wheelchair: Manual    Wheelchair assist level: Dependent - Patient 0%      Wheelchair 50 feet with 2 turns activity    Assist        Assist Level: Dependent - Patient 0%    Wheelchair 150 feet activity     Assist      Assist Level: Dependent - Patient 0%   Blood pressure (!) 120/58, pulse 77, temperature 98.4 F (36.9 C), resp. rate 18, height 5\' 10"  (1.778 m), weight 96.6 kg, SpO2 97%.  Medical Problem List and Plan: 1. 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  Antithrombotics: -DVT/anticoagulation:  Pharmaceutical: Lovenox 40mg  daily             -antiplatelet therapy: Aspirin 81 mg daily and Brilinta 90 mg twice daily 3. Pain Management: Tylenol as needed 4. Mood/Behavior/Sleep: Provide emotional support  - Told me he regrets not taking care of himself earlier in his life -1/17. Begin lexapro 5mg  qhs             -neuropsych eval when provider is available             -antipsychotic agents: N/A 5. Neuropsych/cognition: This patient is capable of making decisions on his own behalf. 6. Skin/Wound Care: Routine skin checks 7. Fluids/Electrolytes/Nutrition: Routine in and outs with follow-up chemistries on Monday   BMET improved 1/20---push fluids, continue H2O flushes thru tube    Latest Ref Rng & Units 12/21/2023    5:38 AM 12/18/2023    7:37 AM 12/14/2023    5:05 AM  BMP  Glucose 70 - 99 mg/dL 161  096  045   BUN 8 - 23 mg/dL 23  26  29    Creatinine 0.61 - 1.24 mg/dL 4.09  8.11  9.14   Sodium 135 - 145 mmol/L 133  134  134   Potassium 3.5 - 5.1 mmol/L 4.2  4.3  3.8   Chloride 98 - 111 mmol/L 99  99  101   CO2 22 - 32 mmol/L 24  26  25    Calcium 8.9 - 10.3 mg/dL 8.7  8.8  8.6    8.  Dysphagia.  Dysphagia #1 nectar thick liquids.   -SLP rec repeat D2 trial CBG (last 3)  Recent Labs    12/24/23 1719 12/24/23 2112 12/25/23 0609  GLUCAP 104* 98 111*   9.   Hyperlipidemia.  Lipitor 20mg  daily 10.  GERD.  Nexium 20mg  daily 11.  Hypothyroidism.  Synthroid daily 12.  COPD exacerbation/respiratory distress.  Patient completed 5-day course of Unasyn.  Prednisone taper completed.   -pt on room air currently. 13. HTN?: unclear if this is a new diagnosis? No mention in notes. BPs elevated overnight. Monitor for now 1/23 controlled off meds  Vitals:   12/21/23 0429 12/21/23 1444 12/21/23 2041 12/22/23 0442  BP: 131/80 139/65 122/75 116/70   12/22/23 1926 12/23/23 0457 12/23/23 1309 12/23/23 1924  BP: 123/68 124/66 (!) 157/77 131/63   12/24/23 0513 12/24/23 1309 12/24/23 1937 12/25/23 0448  BP: 131/66 (!) 125/48 135/69 (!) 120/58    14. Bowel regimen:    -last bm 1/16 , type 4. Continue colace  -12/20/23 LBM yesterday, cont regimen LOS: 14 days A FACE TO FACE EVALUATION WAS PERFORMED  Erick Colace 12/25/2023, 7:29 AM

## 2023-12-25 NOTE — Progress Notes (Signed)
Calorie Count Note  48 hour calorie count ordered 1/22-1/23 for review.   Diet: Dysphagia 1, NTL at time of calorie count Supplements: Might Shake TID, Ensure Enlive TID, Magic Cup BID  Estimated Nutritional Needs:    Kcal:  2000-2250 kcal/day Protein:  98-113 gm/day Fluid:  2000-2250 mL/day  Day 1 - 1/22 Breakfast: 100% puree grits Lunch: 100% puree mac and cheese, 90% minced meatloaf, 50% puree broccoli, 100% Magic Cup Dinner: 100% puree mashed potatoes and gravy, 75% puree berries   Total intake: 1244 kcal (62% of minimum estimated needs)  52 protein (53% of minimum estimated needs)   Day 2 - 1/23 Breakfast: 100% cream of wheat, 50% scrambled eggs, 100% NTL milk Lunch: 30% puree beef w/ brown gravy, 100% mashed potatoes Dinner: 30% puree chicken w/ gravy, 30% puree rice, 100% puree peas, 20% Magic Cup   Total intake: 1217 kcal (61% of minimum estimated needs)  53 protein (54% of minimum estimated needs)  Patient intake increasing slowly. Adequately meeting needs more consistently. Of note, patient Cortrak pulled, per orders, on 1/22, first day of calorie count. He continues to fluctuate back and forth between D1 and D2 texture diets. Per speech, patient continues to make steady gains. Diet advanced to D2, NTL again on 1/24. He has a MBSS scheduled for 1/27 to determine continued appropriateness of NTL, as silent aspiration was noted with thins on 1/7 MBSS. Continue to encourage patient to take in as many whole food sources as possible and use supplemental nutrition to make up the difference. Recommend continuing current regimen of interventions and nutritionally assess weekly for progress and/or adjustment.  Nutrition Dx: Inadequate oral intake related to dysphagia, acute illness as evidenced by meal completion < 50%, other (comment) (need for tube feeding). - remains applicable/ongoing  Goal: Patient will meet greater than or equal to 90% of their needs -  ongoing  Interventions:  -Advance diet, as tolerated  -Mighty Shake TID with meals, each supplement provides 330 kcals and 9 grams of protein    -Continue Ensure Enlive po TID, each supplement provides 350 kcal and 20 grams of protein.   -Continue Magic cup BID with meals, each supplement provides 290 kcal and 9 grams of protein    Myrtie Cruise MS, RD, LDN Registered Dietitian Clinical Nutrition RD Inpatient Contact Info in [place]

## 2023-12-25 NOTE — Progress Notes (Signed)
Physical Therapy Session Note  Patient Details  Name: Ian Wong MRN: [id number] Date of Birth: 03/26/1961  Today's Date: 12/25/2023 PT Individual Time: 4098-1191 PT Individual Time Calculation (min): 73 min   Short Term Goals: Week 1:  PT Short Term Goal 1 (Week 1): Pt will complete bed mobility with supervision PT Short Term Goal 1 - Progress (Week 1): Met PT Short Term Goal 2 (Week 1): Pt will complete transfers CGA with LRAD PT Short Term Goal 2 - Progress (Week 1): Met PT Short Term Goal 3 (Week 1): Pt will ambulate >150' with CGA and LRAD PT Short Term Goal 3 - Progress (Week 1): Met PT Short Term Goal 4 (Week 1): Pt will complete gait up/down ramp with CGA PT Short Term Goal 4 - Progress (Week 1): Met Week 2:  PT Short Term Goal 1 (Week 2): Pt will complete transfers with consistent supervision and LRAD PT Short Term Goal 2 (Week 2): Pt will ambulate >300' with supervision and LRAD. PT Short Term Goal 3 (Week 2): Pt will improve score of Berg Balance Scale by at least 7 points in order to demonstrate progress. PT Short Term Goal 4 (Week 2): Pt will ambulate ramp with overall supervision. Week 3:     Skilled Therapeutic Interventions/Progress Updates:      Therapy Documentation Precautions:  Precautions Precautions: Fall Precaution Comments: L inattention, cortrak Restrictions Weight Bearing Restrictions Per Provider Order: No General:   Vital Signs: Therapy Vitals Temp: 97.7 F (36.5 C) Temp Source: Oral Pulse Rate: 80 Resp: 18 BP: 129/72 Patient Position (if appropriate): Sitting Oxygen Therapy SpO2: 99 % Pain:   Mobility:   Locomotion :    Trunk/Postural Assessment :    Balance:   Exercises:   Other Treatments:      Therapy/Group: Individual Therapy  Loel Dubonnet PT, DPT, CSRS 12/25/2023, 6:14 PM

## 2023-12-25 NOTE — Progress Notes (Signed)
Occupational Therapy Weekly Progress Note  Patient Details  Name: Ian Wong MRN: [id number] Date of Birth: 05/27/1961  Beginning of progress report period: December 19, 2023 End of progress report period: December 25, 2023  Today's Date: 12/25/2023 OT Individual Time: 4098-1191 OT Individual Time Calculation (min): 65 min    Patient has met 3 of 3 short term goals.    Patient continues to demonstrate the following deficits: unbalanced muscle activation and decreased motor planning, decreased visual acuity, decreased visual perceptual skills, decreased visual motor skills, and field cut, decreased attention to left, decreased awareness, decreased problem solving, decreased safety awareness, decreased memory, and delayed processing, and decreased standing balance, hemiplegia, and decreased balance strategies and therefore will continue to benefit from skilled OT intervention to enhance overall performance with BADL.  Patient progressing toward long term goals..  Continue plan of care.  OT Short Term Goals Week 1:  OT Short Term Goal 1 (Week 1): pt will demonstrate improved left side awareness to don L arm and L leg in clothing first to follow through with hemi dressing strategies OT Short Term Goal 1 - Progress (Week 1): Met OT Short Term Goal 2 (Week 1): Pt will don shirt with min A to demonstrate improved memory of dressing techniques. OT Short Term Goal 2 - Progress (Week 1): Met OT Short Term Goal 3 (Week 1): Pt will use L hand to hold wash cloth and wash R arm with 50 % A. OT Short Term Goal 3 - Progress (Week 1): Met OT Short Term Goal 4 (Week 1): pt will ambulated to toilet with CGA with LRAD. OT Short Term Goal 4 - Progress (Week 1): Met Week 2:  OT Short Term Goal 1 (Week 2): pt will complete 1/3 toileting tasks with CGA OT Short Term Goal 1 - Progress (Week 2): Met OT Short Term Goal 2 (Week 2): pt will complete 2 grooming tasks at sink with CGA demonstrating improved  activity tolerance for higher level ADLs OT Short Term Goal 2 - Progress (Week 2): Met OT Short Term Goal 3 (Week 2): pt will incorporate LUE into grooming tasks with < 3 verbal cues OT Short Term Goal 3 - Progress (Week 2): Met Week 3:  OT Short Term Goal 1 (Week 3): STGs = LTGs  Skilled Therapeutic Interventions/Progress Updates:    Pt received in bed and ready for therapy but needed to toilet first. He was able to ambulate to toilet with distant S, mod I to toilet. Ambulated to sink to wash hands with good attention to left hand   asked pt to don his gait belt using L hand to hold one and then bring L hand behind his back.  Pt able to do so and maintained grasp on belt to fasten belt without A.   Had pt sit in arm chair to demonstrate his L shoulder AROM.  He his only able to lift arm to 100 degrees and states he has pain in his shoulder.   Pt ambulated to therapy gym to engage in various activities to allow this therapist to analyze what is causing the restriction of movement and pain. Pt has hypertone that increases with physical effort.  He tends to hike his shoulder when lifting arm.  Needs cues to move shoulder down when reaching hand up.   Pt began in supine,  worked on PROM of scapula with fair amount of gliding.  Asked pt to hold a light dowel bar and reach arms to ceiling  with shoulders at 90 degrees. Pt able to do so without pain, he does have pain at more than 100.  He states he has pain in back of shoulder blade on medial border. Had pt move to sidelying.  Worked on finding trigger points, provided massage to rhomboids and pt stated that it did feel good.  In sidelying with gravity eliminated arm (hand resting on upside down laundry basket) had pt move arm into flexion as therapist facilitate scapular depression and proximal humeral depression.  Pt then moved to sitting and worked on same facilitation movements but with hand on bar of stool. Gravity eliminated from arm, pt worked on forward  reaching of arm with trunk flexion to his level of comfort.    Pt able to actively bring his arm behind his back. And then he worked on L trunk elongation with L hand sliding out on mat into arm abduction.  Pt tolerated these exercises well.   Pt ambulated back to room and used restroom again and washed his hands again.    Pt resting EOB with all needs met. Alarm set and call light in reach.    Therapy Documentation Precautions:  Precautions Precautions: Fall Precaution Comments: L inattention, cortrak Restrictions Weight Bearing Restrictions Per Provider Order: No   Pain: no c/o pain    ADL: ADL Eating: Supervision/safety Grooming: Supervision/safety Where Assessed-Grooming: Standing at sink Upper Body Bathing: Supervision/safety Where Assessed-Upper Body Bathing: Shower Lower Body Bathing: Supervision/safety Where Assessed-Lower Body Bathing: Shower Upper Body Dressing: Supervision/safety, Minimal cueing Where Assessed-Upper Body Dressing: Edge of bed Lower Body Dressing: Supervision/safety, Minimal cueing Where Assessed-Lower Body Dressing: Edge of bed Toileting: Modified independent Where Assessed-Toileting: Teacher, adult education: Close supervision Statistician Method: Proofreader: Engineer, technical sales: Close supervison, Moderate cueing Film/video editor: Close supervision Film/video editor Method: Designer, industrial/product: Grab bars, Transfer tub bench   Therapy/Group: Individual Therapy  Ian Wong 12/25/2023, 12:34 PM

## 2023-12-25 NOTE — Progress Notes (Signed)
Speech Language Pathology Weekly Progress and Session Note  Patient Details  Name: Ian Wong MRN: [id number] Date of Birth: 1961-02-15  Beginning of progress report period: December 18, 2023 End of progress report period: December 25, 2023  Today's Date: 12/25/2023 SLP Individual Time: 0725-0820 SLP Individual Time Calculation (min): 55 min  Short Term Goals: Week 2: SLP Short Term Goal 1 (Week 2): Patient will utilize swallowing compenstory strategies during consumption of least restrictive diet given supervision multimodal A SLP Short Term Goal 1 - Progress (Week 2): Progressing toward goal SLP Short Term Goal 2 (Week 2): Patient will increase speech intelligibility to 100% during conversation with use of strategies given supervision multimodal A SLP Short Term Goal 2 - Progress (Week 2): Met SLP Short Term Goal 3 (Week 2): Patient will demonstrate problem solving in mildly complex situations given min multimodal A SLP Short Term Goal 3 - Progress (Week 2): Progressing toward goal SLP Short Term Goal 4 (Week 2): Patient will recall and utilize memory compensatory strategies given mod multimodal A SLP Short Term Goal 4 - Progress (Week 2): Progressing toward goal SLP Short Term Goal 5 (Week 2): Patient will demonstrate awareness of cognitive and physical changes since admission given min multimodal A SLP Short Term Goal 5 - Progress (Week 2): Met SLP Short Term Goal 6 (Week 2): Patient will attend to L visual field during functional tasks with min multimodal A SLP Short Term Goal 6 - Progress (Week 2): Progressing toward goal    New Short Term Goals: Week 3: SLP Short Term Goal 1 (Week 3): STGs= LTGs d/t ELOS  Weekly Progress Updates:  Patient has made steady gains and has met 2 of 6 STGs this reporting period with good progress towards the remaining goals. Patient is currently completing cognitive tasks with overall min A verbal and visual cues in regards to attention to L side.  Patient is currently consuming a puree diet with nectar-thick liquids with supervision to implement swallowing compensatory strategies. Diet advanced today to a mechanical ground diet with continuation of NTLs. Patient and family education is ongoing. Patient would benefit from continued skilled SLP intervention to maximize cognitive and swallow functioning and overall functional independence prior to discharge.      Intensity: Minumum of 1-2 x/day, 30 to 90 minutes Frequency: 3 to 5 out of 7 days Duration/Length of Stay: 1/30 Treatment/Interventions: Cognitive remediation/compensation;Cueing hierarchy;Dysphagia/aspiration precaution training;Functional tasks;Internal/external aids;Patient/family education;Therapeutic Activities   Daily Session  Skilled Therapeutic Interventions: Pt seen for skilled SLP session to address cognitive and swallowing goals.   Swallowing: SLP prompted therapeutic trials of Dys 3 consistency (soft cookie). Pt consumed x2 independently with no overt or subtle s/s of aspiration observed and no oral pocketing. Pt independently using liquid wash to clear any potential residue. Diet advanced to Dys 2 consistency with continuation of nectar thick liquids.  Anticipate solids can be advanced to Dys 3 or Regular prior to d/c. MBSS scheduled for Mon, 1/27, @ 0900 to determine if pt still needs nectar thick liquids.  Silent aspiration was noted with thin liquids on MBSS completed on 1/7.  Cognition: Pt completed visual memory exercise x2 with 75-90% accuracy.  Improved accuracy to 80-100% given min cues. Pt completed visual safety awareness task with min cues to attend to safety items on L hand side. Pt left sitting up in bed with call bell in reach and bed alarm activated. Continue SLP PoC.     General    Pain Pain Assessment Pain  Scale: 0-10 Pain Score: 0-No pain  Therapy/Group: Individual Therapy  Ellery Plunk 12/25/2023, 8:15 AM

## 2023-12-26 LAB — GLUCOSE, CAPILLARY
Glucose-Capillary: 104 mg/dL — ABNORMAL HIGH (ref 70–99)
Glucose-Capillary: 108 mg/dL — ABNORMAL HIGH (ref 70–99)
Glucose-Capillary: 100 mg/dL — ABNORMAL HIGH (ref 70–99)
Glucose-Capillary: 99 mg/dL (ref 70–99)

## 2023-12-26 MED ORDER — SORBITOL 70 % SOLN
30.0000 mL | Freq: Every day | Status: DC | PRN
  Administered 2023-12-26: 30 mL via ORAL
  Filled 2023-12-26: qty 30

## 2023-12-26 NOTE — Progress Notes (Signed)
PROGRESS NOTE   Subjective/Complaints:  Pt doing well, happy to have NGT out. Slept well, denies pain, unsure of LBM but looks like he had a small one yesterday. Urinating fine. Denies any other complaints or concerns today.   ROS: Limited due to cognitive/behavioral, but denied CP, SOB, abd pain, n/v/d/c  Objective:   No results found.  No results for input(s): "WBC", "HGB", "HCT", "PLT" in the last 72 hours.   No results for input(s): "NA", "K", "CL", "CO2", "GLUCOSE", "BUN", "CREATININE", "CALCIUM" in the last 72 hours.         Intake/Output Summary (Last 24 hours) at 12/26/2023 1045 Last data filed at 12/26/2023 0753 Gross per 24 hour  Intake 296 ml  Output --  Net 296 ml        Physical Exam: Vital Signs Blood pressure 115/80, pulse 88, temperature 98.2 F (36.8 C), temperature source Oral, resp. rate 18, height 5\' 10"  (1.778 m), weight 96.6 kg, SpO2 96%.  General: No acute distress, resting in bed Mood and affect are appropriate Heart: Regular rate and rhythm no rubs murmurs or extra sounds Lungs: Clear to auscultation, breathing unlabored, no rales or wheezes, no rales heard  Abdomen: Positive bowel sounds, soft nontender to palpation, nondistended Extremities: No clubbing, cyanosis, or edema Skin: No evidence of breakdown, no evidence of rash over exposed surfaces  PRIOR EXAMS: Neurologic: Cranial nerves II through XII intact, motor strength is 5/5 in right  deltoid, bicep, tricep, grip, hip flexor, knee extensors, ankle dorsiflexor and plantar flexor 3 to 3+ LUE, 4- to 4/5 LLE  Musculoskeletal: Musculoskeletal: Full range of motion in all 4 extremities. No joint swelling. Reduced active left shoulder abduction    Assessment/Plan: 1. Functional deficits which require 3+ hours per day of interdisciplinary therapy in a comprehensive inpatient rehab setting. Physiatrist is providing close team  supervision and 24 hour management of active medical problems listed below. Physiatrist and rehab team continue to assess barriers to discharge/monitor patient progress toward functional and medical goals  Care Tool:  Bathing    Body parts bathed by patient: Chest, Abdomen, Buttocks, Front perineal area, Right upper leg, Left upper leg, Face, Left arm   Body parts bathed by helper: Right arm, Right lower leg, Left lower leg     Bathing assist Assist Level: Moderate Assistance - Patient 50 - 74%     Upper Body Dressing/Undressing Upper body dressing   What is the patient wearing?: Pull over shirt    Upper body assist Assist Level: Moderate Assistance - Patient 50 - 74%    Lower Body Dressing/Undressing Lower body dressing      What is the patient wearing?: Incontinence brief, Pants     Lower body assist Assist for lower body dressing: Moderate Assistance - Patient 50 - 74%     Toileting Toileting    Toileting assist Assist for toileting: Minimal Assistance - Patient > 75%     Transfers Chair/bed transfer  Transfers assist     Chair/bed transfer assist level: Contact Guard/Touching assist     Locomotion Ambulation   Ambulation assist      Assist level: Contact Guard/Touching assist Assistive device: No Device Max  distance: 150'   Walk 10 feet activity   Assist     Assist level: Contact Guard/Touching assist Assistive device: No Device   Walk 50 feet activity   Assist    Assist level: Contact Guard/Touching assist Assistive device: No Device    Walk 150 feet activity   Assist    Assist level: Minimal Assistance - Patient > 75% Assistive device: No Device    Walk 10 feet on uneven surface  activity   Assist Walk 10 feet on uneven surfaces activity did not occur: Safety/medical concerns         Wheelchair     Assist Is the patient using a wheelchair?: Yes Type of Wheelchair: Manual    Wheelchair assist level: Dependent  - Patient 0%      Wheelchair 50 feet with 2 turns activity    Assist        Assist Level: Dependent - Patient 0%   Wheelchair 150 feet activity     Assist      Assist Level: Dependent - Patient 0%   Blood pressure 115/80, pulse 88, temperature 98.2 F (36.8 C), temperature source Oral, resp. rate 18, height 5\' 10"  (1.778 m), weight 96.6 kg, SpO2 96%.  Medical Problem List and Plan: 1. 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  Antithrombotics: -DVT/anticoagulation:  Pharmaceutical: Lovenox 40mg  daily             -antiplatelet therapy: Aspirin 81 mg daily and Brilinta 90 mg twice daily 3. Pain Management: Tylenol as needed 4. Mood/Behavior/Sleep: Provide emotional support  - Told me he regrets not taking care of himself earlier in his life -1/17. Begin lexapro 5mg  qhs             -neuropsych eval when provider is available             -antipsychotic agents: N/A 5. Neuropsych/cognition: This patient is capable of making decisions on his own behalf. 6. Skin/Wound Care: Routine skin checks 7. Fluids/Electrolytes/Nutrition: Routine in and outs with follow-up chemistries on Monday   -BMET improved 1/20---push fluids, continue H2O flushes thru tube    Latest Ref Rng & Units 12/21/2023    5:38 AM 12/18/2023    7:37 AM 12/14/2023    5:05 AM  BMP  Glucose 70 - 99 mg/dL 161  096  045   BUN 8 - 23 mg/dL 23  26  29    Creatinine 0.61 - 1.24 mg/dL 4.09  8.11  9.14   Sodium 135 - 145 mmol/L 133  134  134   Potassium 3.5 - 5.1 mmol/L 4.2  4.3  3.8   Chloride 98 - 111 mmol/L 99  99  101   CO2 22 - 32 mmol/L 24  26  25    Calcium 8.9 - 10.3 mg/dL 8.7  8.8  8.6    8.  Dysphagia.  Dysphagia #1 nectar thick  liquids.   -SLP rec repeat D2 trial -12/26/23 no insulin needed, CBGs fine, off TFs, will decrease CBG checks to BID for now.   CBG (last 3)  Recent Labs  12/25/23 1625 12/25/23 2052 12/26/23 0546  GLUCAP 112* 88 104*   9.  Hyperlipidemia.  Lipitor 20mg  daily 10.  GERD.  Nexium 20mg  daily 11.  Hypothyroidism.  Synthroid daily 12.  COPD exacerbation/respiratory distress.  Patient completed 5-day course of Unasyn.  Prednisone taper completed.   -pt on room air currently. 13. HTN?: unclear if this is a new diagnosis? No mention in notes. BPs elevated overnight. Monitor for now -1/23-25 controlled off meds  Vitals:   12/22/23 0442 12/22/23 1926 12/23/23 0457 12/23/23 1309  BP: 116/70 123/68 124/66 (!) 157/77   12/23/23 1924 12/24/23 0513 12/24/23 1309 12/24/23 1937  BP: 131/63 131/66 (!) 125/48 135/69   12/25/23 0448 12/25/23 1432 12/25/23 1921 12/26/23 0317  BP: (!) 120/58 129/72 127/63 115/80    14. Bowel regimen:    -last bm 1/16 , type 4. Continue colace  -12/20/23 LBM yesterday, cont regimen  -12/26/23 LBM yesterday, small, cont regimen for now.   LOS: 15 days A FACE TO FACE EVALUATION WAS PERFORMED  [address] 12/26/2023, 10:45 AM

## 2023-12-26 NOTE — Progress Notes (Signed)
Physical Therapy Weekly Progress Note  Patient Details  Name: Ian Wong MRN: [id number] Date of Birth: 1960-12-24  Beginning of progress report period: December 20, 2023 End of progress report period: December 26, 2023  Today's Date: 12/26/2023   Patient has met 2 of 4 short term goals.  Pt making appropriate progress towards goals and is on track to meet LTG. He has not missed any therapy sessions this reporting period and demos good participation during sessions. He completes bed mobility with sup/ mod I, sit<>stand transfers with supervision, and stand<>pivot transfers with supervision/ CGA with no AD. He is ambulating distances more than 200 ft with supervision/ CGA using no AD and has shown ability to navigate twelve 6-in steps with supervision/ CGA and 1-2 hand rails. He continues to be primarily limited by mild visual cut to L side, decreased dynamic standing balance, LUE and LLE weakness/ pareisis, and gait impairments. No family has yet participated in family education to prepare for d/c home. Expected to train with wife on upcoming Tuesday just prior to d/c.   Patient continues to demonstrate the following deficits muscle weakness, impaired timing and sequencing, unbalanced muscle activation, and decreased coordination, field cut, decreased attention to left and decreased motor planning, decreased attention, decreased awareness, decreased safety awareness, and delayed processing, and decreased standing balance and decreased balance strategies and therefore will continue to benefit from skilled PT intervention to increase functional independence with mobility.  Patient progressing toward long term goals..  Continue plan of care.  PT Short Term Goals Week 2:  PT Short Term Goal 1 (Week 2): Pt will complete transfers with consistent supervision and LRAD PT Short Term Goal 1 - Progress (Week 2): Met PT Short Term Goal 2 (Week 2): Pt will ambulate >300' with supervision and LRAD. PT  Short Term Goal 2 - Progress (Week 2): Progressing toward goal PT Short Term Goal 3 (Week 2): Pt will improve score of Berg Balance Scale by at least 7 points in order to demonstrate progress. PT Short Term Goal 3 - Progress (Week 2): Met PT Short Term Goal 4 (Week 2): Pt will ambulate ramp with overall supervision. PT Short Term Goal 4 - Progress (Week 2): Progressing toward goal Week 3:  PT Short Term Goal 1 (Week 3): LTG = STG d/t ELOS  Skilled Therapeutic Interventions/Progress Updates:  Ambulation/gait training;Discharge planning;Functional mobility training;Psychosocial support;Therapeutic Activities;Visual/perceptual remediation/compensation;Balance/vestibular training;Disease management/prevention;Neuromuscular re-education;Therapeutic Exercise;Cognitive remediation/compensation;DME/adaptive equipment instruction;Pain management;UE/LE Strength taining/ROM;Community reintegration;Patient/family education;Stair training;UE/LE Coordination activities;Splinting/orthotics;Functional electrical stimulation   Therapy Documentation Precautions:  Precautions Precautions: Fall Precaution Comments: L inattention, cortrak Restrictions Weight Bearing Restrictions Per Provider Order: No  Pain:  No pain demo'd this session.   Therapy/Group: Individual Therapy  Loel Dubonnet PT, DPT, CSRS 12/26/2023, 9:21 AM

## 2023-12-26 NOTE — Progress Notes (Signed)
Physical Therapy Session Note  Patient Details  Name: Ian Wong MRN: [id number] Date of Birth: [birth year]  Today's Date: 12/26/2023 PT Individual Time: 0920-1003 PT Individual Time Calculation (min): 43 min   Short Term Goals: Week 1:  PT Short Term Goal 1 (Week 1): Pt will complete bed mobility with supervision PT Short Term Goal 1 - Progress (Week 1): Met PT Short Term Goal 2 (Week 1): Pt will complete transfers CGA with LRAD PT Short Term Goal 2 - Progress (Week 1): Met PT Short Term Goal 3 (Week 1): Pt will ambulate >150' with CGA and LRAD PT Short Term Goal 3 - Progress (Week 1): Met PT Short Term Goal 4 (Week 1): Pt will complete gait up/down ramp with CGA PT Short Term Goal 4 - Progress (Week 1): Met Week 2:  PT Short Term Goal 1 (Week 2): Pt will complete transfers with consistent supervision and LRAD PT Short Term Goal 1 - Progress (Week 2): Met PT Short Term Goal 2 (Week 2): Pt will ambulate >300' with supervision and LRAD. PT Short Term Goal 2 - Progress (Week 2): Progressing toward goal PT Short Term Goal 3 (Week 2): Pt will improve score of Berg Balance Scale by at least 7 points in order to demonstrate progress. PT Short Term Goal 3 - Progress (Week 2): Met PT Short Term Goal 4 (Week 2): Pt will ambulate ramp with overall supervision. PT Short Term Goal 4 - Progress (Week 2): Progressing toward goal Week 3:  PT Short Term Goal 1 (Week 3): LTG = STG d/t ELOS  Skilled Therapeutic Interventions/Progress Updates:  Patient supine in bed on entrance to room. Patient alert and agreeable to PT session.   Patient with no pain complaint at start of session.  Therapeutic Activity: Bed Mobility: Pt performed supine <> sit with Mod I from hospital bed. Pt is also able to perform supine <> sit in standard bed in ADL apartment with no cueing required to complete. Transfers: Pt performed sit<>stand and stand pivot transfers throughout session with supervision. Is also able to  perform sit<>stand from rocking recliner in ADL apartment with no cueing required. Pt has rocking recliner at home and is able to elevate legs and return to upright position as well with supervision/ Mod I for time.   Gait Training:  Pt ambulated 180 ft x2 using no AD with close supervision/ CGA. Demonstrated one LOB d/t NBOS with LLE and inability to correct. Requires MinA to maintain balance.  Neuromuscular Re-ed: NMR facilitated during session with focus on dynamic gait and balance. Pt guided in ambulation through agility ladder: Lateral stepping to R then back to L placing 2 feet in each square x2 Forward stepping with alternating lead step and bringing both feet into single square x2 One step into each square out and back x3 One step in each square with high knees and slower pace out and back x2  Pt demonstrates learning by improving performance with vc and visual demonstration provided during each progression. Demos difficulty initially with all and demos 1 LOB anteriorly with lateral stepping requiring CGA to maintain balance, then good progression with education on weight shift for improving balance. All improving proprioception to whole body balance.   NMR performed for improvements in motor control and coordination, balance, sequencing, judgement, and self confidence/ efficacy in performing all aspects of mobility at highest level of independence.   Patient toilets at end of session with supervision and requires assist with vc to perform wrist  extension inside pants in order complete donning on L side. Then pt seated on EOB with RN present and providing medication. All needs within reach.   Therapy Documentation Precautions:  Precautions Precautions: Fall Precaution Comments: L inattention, cortrak Restrictions Weight Bearing Restrictions Per Provider Order: No  Pain:    Therapy/Group: Individual Therapy  Loel Dubonnet PT, DPT, CSRS 12/26/2023, 2:15 PM

## 2023-12-26 NOTE — Progress Notes (Signed)
Occupational Therapy Session Note  Patient Details  Name: Ian Wong MRN: [id number] Date of Birth: 08/19/1961  Today's Date: 12/26/2023 OT Individual Time: 1006-1046 OT Individual Time Calculation (min): 40 min    Short Term Goals: Week 1:  OT Short Term Goal 1 (Week 1): pt will demonstrate improved left side awareness to don L arm and L leg in clothing first to follow through with hemi dressing strategies OT Short Term Goal 1 - Progress (Week 1): Met OT Short Term Goal 2 (Week 1): Pt will don shirt with min A to demonstrate improved memory of dressing techniques. OT Short Term Goal 2 - Progress (Week 1): Met OT Short Term Goal 3 (Week 1): Pt will use L hand to hold wash cloth and wash R arm with 50 % A. OT Short Term Goal 3 - Progress (Week 1): Met OT Short Term Goal 4 (Week 1): pt will ambulated to toilet with CGA with LRAD. OT Short Term Goal 4 - Progress (Week 1): Met  Skilled Therapeutic Interventions/Progress Updates:     Pt received sitting EOB taking medications with nursing staff present in room. Pt presenting to be in good spirits receptive to skilled OT session reporting 0/10 pain at rest- OT offering intermittent rest breaks, repositioning, and therapeutic support to optimize participation in therapy session. Focus this session body awareness and L hemibody NMRE.   Therapeutic Activity:  Pt completed functional mobility <> therapy gym no AD with CGA- no LOB, min verbal cues required for L side attention to environmental obstacles in hallway.   NMRE: Engaged Pt in series of L UE NMRE activities/exercises to increase awareness to L UE positioning to improve functional movement patterns in pain free ROM. Educated Pt on body mechanics required for reaching >90 degrees shoulder flexion with focus on relationship between scapula and humerus with Pt receptive to education.  -Sitting EOM, worked on PROM and AAROM with OT providing tactile and verbal cues for scapular  mobilization. Pt reporting pain when reaching arm >90-100 degrees with increased tone and shoulder elevation noted when giving effort to lift arm. Worked on anterior functional reaching with hand under hand assistance provided from OT manual facilitation for shoulder depression and scapular rotation with mod improvement lifting ~120 degrees and Pt reporting decreased pain with therapist assist.  -Sitting EOM, worked on scapular protraction/retraction with Pt instructed to push/pull slanted table with good scapular movements noted in this plane.  -Worked in gravity eliminated with inferior reaching and maintained grasp incorporated into task. Pt instructed to reach inferiorly with shoulder at 90 degrees to retrieve bean bag from therapist, flexing shoulder ~120-130 degrees to place bean bag into bucket. OT facilitated scapular depression and proximal humeral depression with decrease compensatory movements noted with increased practice. Pt with min dropping of bean bag during task -Pt completed 1x10 reps in each direction of shoulder internal/external rotation passing bean bag behind back with min dropping noted.    Pt handed off to nursing staff at end of session to address toileting needs with all needs met.    Therapy Documentation Precautions:  Precautions Precautions: Fall Precaution Comments: L inattention, cortrak Restrictions Weight Bearing Restrictions Per Provider Order: No   Therapy/Group: Individual Therapy  Clide Deutscher 12/26/2023, 7:55 AM

## 2023-12-26 NOTE — Progress Notes (Signed)
Speech Language Pathology Daily Session Note  Patient Details  Name: Ian Wong MRN: [id number] Date of Birth: 1961-09-12  Today's Date: 12/26/2023 SLP Individual Time: 1116-1200 SLP Individual Time Calculation (min): 44 min  Short Term Goals: Week 3: SLP Short Term Goal 1 (Week 3): STGs= LTGs d/t ELOS  Skilled Therapeutic Interventions:  Pt seen for ST targeting dysphagia and cognitive linguistic goals. Pt in bed upon SLP arrival and agreeable to session. Denied pain. SLP facilitated a trial of Dys 3 with nectar thick liquids via straw. Mastication WFL and pt with no buccal cavity residue observed. Pt recalled strategy of lingual/finger sweep with 100% acc independently. NT reported that pt has done well with diet texture upgrade to Dys 2 and no concerns observed during trial with Dys 3 item therefore, pt upgraded to intermittent supervision. No overt s/sx of aspiration were observed during solid trials. Pt awaiting MBSS 1/27 to determine potential for liquid upgrade given hx of silent aspiration. SLP then facilitated therapy tasks targeting L inattention. Pt able to match items in field of 8 with 100% acc and able to read receipts with 100% acc independently. Pt required mod A to attend to L side during conversation. Pt left in bed with call bell in place and bed alarm activated. Pt would benefit from continued trials of Dys 3 to determine potential for diet texture upgrade. Continue ST POC.     Pain Pain Assessment Pain Scale: 0-10 Pain Score: 0-No pain  Therapy/Group: Individual Therapy  Alphonsus Sias 12/26/2023, 3:55 PM

## 2023-12-27 DIAGNOSIS — K5901 Slow transit constipation: Secondary | ICD-10-CM

## 2023-12-27 LAB — GLUCOSE, CAPILLARY
Glucose-Capillary: 107 mg/dL — ABNORMAL HIGH (ref 70–99)
Glucose-Capillary: 100 mg/dL — ABNORMAL HIGH (ref 70–99)
Glucose-Capillary: 76 mg/dL (ref 70–99)
Glucose-Capillary: 92 mg/dL (ref 70–99)

## 2023-12-27 NOTE — Progress Notes (Signed)
PROGRESS NOTE   Subjective/Complaints:  Pt doing well again today. Slept well, denies pain, LBM last night and again this morning after eval. Urinating fine. Denies any other complaints or concerns today.   ROS: Limited due to cognitive/behavioral, but denied CP, SOB, abd pain, n/v/d/c  Objective:   No results found.  No results for input(s): "WBC", "HGB", "HCT", "PLT" in the last 72 hours.   No results for input(s): "NA", "K", "CL", "CO2", "GLUCOSE", "BUN", "CREATININE", "CALCIUM" in the last 72 hours.         Intake/Output Summary (Last 24 hours) at 12/27/2023 1145 Last data filed at 12/27/2023 0801 Gross per 24 hour  Intake 717 ml  Output --  Net 717 ml        Physical Exam: Vital Signs Blood pressure 134/74, pulse 71, temperature 98 F (36.7 C), resp. rate 18, height 5\' 10"  (1.778 m), weight 96.6 kg, SpO2 100%.  General: No acute distress, resting in bed watching TV Mood and affect are appropriate Heart: Regular rate and rhythm no rubs murmurs or extra sounds Lungs: Clear to auscultation, breathing unlabored, no rales or wheezes, no rales heard  Abdomen: Positive bowel sounds, soft nontender to palpation, nondistended Extremities: No clubbing, cyanosis, or edema Skin: No evidence of breakdown, no evidence of rash over exposed surfaces  PRIOR EXAMS: Neurologic: Cranial nerves II through XII intact, motor strength is 5/5 in right  deltoid, bicep, tricep, grip, hip flexor, knee extensors, ankle dorsiflexor and plantar flexor 3 to 3+ LUE, 4- to 4/5 LLE  Musculoskeletal: Musculoskeletal: Full range of motion in all 4 extremities. No joint swelling. Reduced active left shoulder abduction    Assessment/Plan: 1. Functional deficits which require 3+ hours per day of interdisciplinary therapy in a comprehensive inpatient rehab setting. Physiatrist is providing close team supervision and 24 hour management of  active medical problems listed below. Physiatrist and rehab team continue to assess barriers to discharge/monitor patient progress toward functional and medical goals  Care Tool:  Bathing    Body parts bathed by patient: Chest, Abdomen, Buttocks, Front perineal area, Right upper leg, Left upper leg, Face, Left arm   Body parts bathed by helper: Right arm, Right lower leg, Left lower leg     Bathing assist Assist Level: Moderate Assistance - Patient 50 - 74%     Upper Body Dressing/Undressing Upper body dressing   What is the patient wearing?: Pull over shirt    Upper body assist Assist Level: Moderate Assistance - Patient 50 - 74%    Lower Body Dressing/Undressing Lower body dressing      What is the patient wearing?: Incontinence brief, Pants     Lower body assist Assist for lower body dressing: Moderate Assistance - Patient 50 - 74%     Toileting Toileting    Toileting assist Assist for toileting: Minimal Assistance - Patient > 75%     Transfers Chair/bed transfer  Transfers assist     Chair/bed transfer assist level: Contact Guard/Touching assist     Locomotion Ambulation   Ambulation assist      Assist level: Contact Guard/Touching assist Assistive device: No Device Max distance: 150'   Walk 10 feet  activity   Assist     Assist level: Contact Guard/Touching assist Assistive device: No Device   Walk 50 feet activity   Assist    Assist level: Contact Guard/Touching assist Assistive device: No Device    Walk 150 feet activity   Assist    Assist level: Minimal Assistance - Patient > 75% Assistive device: No Device    Walk 10 feet on uneven surface  activity   Assist Walk 10 feet on uneven surfaces activity did not occur: Safety/medical concerns         Wheelchair     Assist Is the patient using a wheelchair?: Yes Type of Wheelchair: Manual    Wheelchair assist level: Dependent - Patient 0%      Wheelchair 50  feet with 2 turns activity    Assist        Assist Level: Dependent - Patient 0%   Wheelchair 150 feet activity     Assist      Assist Level: Dependent - Patient 0%   Blood pressure 134/74, pulse 71, temperature 98 F (36.7 C), resp. rate 18, height 5\' 10"  (1.778 m), weight 96.6 kg, SpO2 100%.  Medical Problem List and Plan: 1. 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  Antithrombotics: -DVT/anticoagulation:  Pharmaceutical: Lovenox 40mg  daily             -antiplatelet therapy: Aspirin 81 mg daily and Brilinta 90 mg twice daily 3. Pain Management: Tylenol as needed 4. Mood/Behavior/Sleep: Provide emotional support  - Told me he regrets not taking care of himself earlier in his life -1/17. Begin lexapro 5mg  qhs             -neuropsych eval when provider is available             -antipsychotic agents: N/A 5. Neuropsych/cognition: This patient is capable of making decisions on his own behalf. 6. Skin/Wound Care: Routine skin checks 7. Fluids/Electrolytes/Nutrition: Routine in and outs with follow-up chemistries on Monday   -BMET improved 1/20---push fluids, continue H2O flushes thru tube    Latest Ref Rng & Units 12/21/2023    5:38 AM 12/18/2023    7:37 AM 12/14/2023    5:05 AM  BMP  Glucose 70 - 99 mg/dL 101  751  025   BUN 8 - 23 mg/dL 23  26  29    Creatinine 0.61 - 1.24 mg/dL 8.52  7.78  2.42   Sodium 135 - 145 mmol/L 133  134  134   Potassium 3.5 - 5.1 mmol/L 4.2  4.3  3.8   Chloride 98 - 111 mmol/L 99  99  101   CO2 22 - 32 mmol/L 24  26  25    Calcium 8.9 - 10.3 mg/dL 8.7  8.8  8.6    8.  Dysphagia.  Dysphagia #1 nectar thick liquids.   -SLP rec repeat D2 trial -12/26/23 no insulin  needed, CBGs fine, off TFs, will decrease CBG checks to BID for now. May consider d/c'ing checks this week if continues to do well  CBG (last 3)  Recent Labs  12/26/23 1652 12/26/23 2052 12/27/23 0600  GLUCAP 100* 99 107*   9.  Hyperlipidemia.  Lipitor 20mg  daily 10.  GERD.  Nexium 20mg  daily 11.  Hypothyroidism.  Synthroid daily 12.  COPD exacerbation/respiratory distress.  Patient completed 5-day course of Unasyn.  Prednisone taper completed.   -pt on room air currently. 13. HTN?: unclear if this is a new diagnosis? No mention in notes. BPs elevated overnight. Monitor for now -1/23-26 controlled off meds  Vitals:   12/23/23 1309 12/23/23 1924 12/24/23 0513 12/24/23 1309  BP: (!) 157/77 131/63 131/66 (!) 125/48   12/24/23 1937 12/25/23 0448 12/25/23 1432 12/25/23 1921  BP: 135/69 (!) 120/58 129/72 127/63   12/26/23 0317 12/26/23 1425 12/26/23 1948 12/27/23 0437  BP: 115/80 (!) 154/76 123/69 134/74    14. Bowel regimen:    -last bm 1/16 , type 4. Continue colace  -12/27/23 LBM this morning, cont regimen  LOS: 16 days A FACE TO FACE EVALUATION WAS PERFORMED  [address] 12/27/2023, 11:45 AM

## 2023-12-28 LAB — GLUCOSE, CAPILLARY
Glucose-Capillary: 103 mg/dL — ABNORMAL HIGH (ref 70–99)
Glucose-Capillary: 104 mg/dL — ABNORMAL HIGH (ref 70–99)
Glucose-Capillary: 100 mg/dL — ABNORMAL HIGH (ref 70–99)
Glucose-Capillary: 104 mg/dL — ABNORMAL HIGH (ref 70–99)

## 2023-12-28 LAB — CBC
HCT: 41 % (ref 39.0–52.0)
Hemoglobin: 14.7 g/dL (ref 13.0–17.0)
MCH: 31.7 pg (ref 26.0–34.0)
MCHC: 35.9 g/dL (ref 30.0–36.0)
MCV: 88.4 fL (ref 80.0–100.0)
Platelets: 196 10*3/uL (ref 150–400)
RBC: 4.64 MIL/uL (ref 4.22–5.81)
RDW: 13.2 % (ref 11.5–15.5)
WBC: 5.5 10*3/uL (ref 4.0–10.5)
nRBC: 0 % (ref 0.0–0.2)

## 2023-12-28 LAB — BASIC METABOLIC PANEL
Anion gap: 8 (ref 5–15)
BUN: 13 mg/dL (ref 8–23)
CO2: 22 mmol/L (ref 22–32)
Calcium: 8.5 mg/dL — ABNORMAL LOW (ref 8.9–10.3)
Chloride: 103 mmol/L (ref 98–111)
Creatinine, Ser: 1.22 mg/dL (ref 0.61–1.24)
GFR, Estimated: >60 mL/min (ref >60)
Glucose, Bld: 97 mg/dL (ref 70–99)
Potassium: 4.1 mmol/L (ref 3.5–5.1)
Sodium: 133 mmol/L — ABNORMAL LOW (ref 135–145)

## 2023-12-28 NOTE — Progress Notes (Signed)
Occupational Therapy Session Note  Patient Details  Name: Ian Wong MRN: [id number] Date of Birth: 1961-03-26  Today's Date: 12/28/2023 OT Individual Time: 8756-4332 OT Individual Time Calculation (min): 53 min    Short Term Goals: Week 1:  OT Short Term Goal 1 (Week 1): pt will demonstrate improved left side awareness to don L arm and L leg in clothing first to follow through with hemi dressing strategies OT Short Term Goal 1 - Progress (Week 1): Met OT Short Term Goal 2 (Week 1): Pt will don shirt with min A to demonstrate improved memory of dressing techniques. OT Short Term Goal 2 - Progress (Week 1): Met OT Short Term Goal 3 (Week 1): Pt will use L hand to hold wash cloth and wash R arm with 50 % A. OT Short Term Goal 3 - Progress (Week 1): Met OT Short Term Goal 4 (Week 1): pt will ambulated to toilet with CGA with LRAD. OT Short Term Goal 4 - Progress (Week 1): Met Week 2:  OT Short Term Goal 1 (Week 2): pt will complete 1/3 toileting tasks with CGA OT Short Term Goal 1 - Progress (Week 2): Met OT Short Term Goal 2 (Week 2): pt will complete 2 grooming tasks at sink with CGA demonstrating improved activity tolerance for higher level ADLs OT Short Term Goal 2 - Progress (Week 2): Met OT Short Term Goal 3 (Week 2): pt will incorporate LUE into grooming tasks with < 3 verbal cues OT Short Term Goal 3 - Progress (Week 2): Met Week 3:  OT Short Term Goal 1 (Week 3): STGs = LTGs  Skilled Therapeutic Interventions/Progress Updates:    Pt received in bed ready for therapy.  Pt seen this session to focus on LUE NMR.   Pt ambulated with supervision to gym..   Demonstrated to pt how to move to quadriped on mat and he followed directions well. In quadriped,  pt work on scapular protraction for enhanced shoulder stability.  Pt worked on 10 reps for 3 sets with resting back in sitting on heels.   Pt developed a muscle cramp in hamstring so moved to sitting.   Worked on protraction  exercises in standing with hands on countertop with cues to fully extend elbow.     Moved to sidelying on R on mat and focused on controlled shoulder flexion with a focus on depression of shoulder. Cued pt pull shoulder down to lift arm up.  Pt worked on moving a long vertical stick by tilting it  while he was in sidelying with palm up, palm down and then with rolling a large ball.  Used manual facilitation to guide his scapula down with arm lifting.   Pt sat to edge of mat and worked on moving vertical bar back and forth with left arm as if he was moving a lever,    Pt ambulated back to his room and needed to toilet.  MOD IND to walk to bathroom, use toilet, walk out to sink to wash hands and then sit back to bed.   Pt resting in bed with all needs met. Alarm set and call light in reach.    Therapy Documentation Precautions:  Precautions Precautions: Fall Precaution Comments: L inattention, cortrak Restrictions Weight Bearing Restrictions Per Provider Order: No   Pain: Pain Assessment Pain Scale: 0-10 Pain Score: 0-No pain ADL: ADL Eating: Supervision/safety Grooming: Supervision/safety Where Assessed-Grooming: Standing at sink Upper Body Bathing: Supervision/safety Where Assessed-Upper Body Bathing: Shower Lower Body  Bathing: Supervision/safety Where Assessed-Lower Body Bathing: Shower Upper Body Dressing: Supervision/safety, Minimal cueing Where Assessed-Upper Body Dressing: Edge of bed Lower Body Dressing: Supervision/safety, Minimal cueing Where Assessed-Lower Body Dressing: Edge of bed Toileting: Modified independent Where Assessed-Toileting: Teacher, adult education: Close supervision Statistician Method: Proofreader: Engineer, technical sales: Close supervison, Moderate cueing Film/video editor: Close supervision Film/video editor Method: Designer, industrial/product: Grab bars, Transfer tub bench   Therapy/Group:  Individual Therapy  Karimah Winquist 12/28/2023, 3:51 PM

## 2023-12-28 NOTE — Progress Notes (Signed)
Physical Therapy Session Note  Patient Details  Name: Ian Wong MRN: [id number] Date of Birth: 10/12/1961  Today's Date: 12/28/2023 PT Individual Time: 0805-0905 PT Individual Time Calculation (min): 60 min   Short Term Goals: Week 2:  PT Short Term Goal 1 (Week 2): Pt will complete transfers with consistent supervision and LRAD PT Short Term Goal 1 - Progress (Week 2): Met PT Short Term Goal 2 (Week 2): Pt will ambulate >300' with supervision and LRAD. PT Short Term Goal 2 - Progress (Week 2): Progressing toward goal PT Short Term Goal 3 (Week 2): Pt will improve score of Berg Balance Scale by at least 7 points in order to demonstrate progress. PT Short Term Goal 3 - Progress (Week 2): Met PT Short Term Goal 4 (Week 2): Pt will ambulate ramp with overall supervision. PT Short Term Goal 4 - Progress (Week 2): Progressing toward goal Week 3:  PT Short Term Goal 1 (Week 3): LTG = STG d/t ELOS  Skilled Therapeutic Interventions/Progress Updates:  Patient supine in bed on entrance to room. Initially on phone with wife and allowed to complete phone call. Patient alert and agreeable to PT session.   Patient with no pain complaint at start of session.  Therapeutic Activity: Bed Mobility: Pt performed supine > sit with IND and extra time. No cueing for technique. Is able to don R shoe with setup and L shoe with MinA d/t required hold of tongue and shoe back.  Transfers: Pt performed sit<>stand with supervision/ Mod I and stand pivot transfers throughout session with supervision. Provided vc/ tc for turning to look L when descending to sit.   Gait Training:  Pt ambulated 180 ft x2 using no AD with close supervision/ CGA. Demonstrated improving focus to LLE in order to ensure heel-to-toe progression. Provided vc/ tc for maintaining.  Neuromuscular Re-ed: NMR facilitated during session with focus on dynamic gait/ standing balance, motor control, motor/ cognitive processing and time. Pt  guided in the following activities with 4# aw to LLE.   - Ambulation over 6" hurdles with time spent prior to performance in motor planning and processing required movements. Improved performance after time to process.   - Guided in ambulatory slalom through hurdles placed 3 feet apart and requires educ/ vc for maintaining visual on position of hurdles as pt initially turning too close on L turns and moving or touching hurdles.  Improves with vc for increased focus.  - stand pivot activity with foam lego bolcks separated by color and on tables to R and L. Table in front of pt with picture diagram of structure being built. Pt requires ~30-40% cues re: overall build. Is able to collect correct numbers of blocks per build with ~15% questioning cues required.   Of note, on initial ambulation back to room and with removal of 4#aw, pt picks LLE up into near march initially and then returns back to variable, but adequate step height/ length with LLE.   NMR performed for improvements in motor control and coordination, balance, sequencing, judgement, and self confidence/ efficacy in performing all aspects of mobility at highest level of independence.   Patient supine in bed at end of session with brakes locked, bed alarm set, and all needs within reach.   Therapy Documentation Precautions:  Precautions Precautions: Fall Precaution Comments: L inattention, cortrak Restrictions Weight Bearing Restrictions Per Provider Order: No  Pain:  No pain related this session.   Therapy/Group: Individual Therapy  Loel Dubonnet PT, DPT, CSRS  12/28/2023, 10:24 AM

## 2023-12-28 NOTE — Progress Notes (Signed)
Occupational Therapy Session Note  Patient Details  Name: Ian Wong MRN: [id number] Date of Birth: [birth year]  Today's Date: 12/28/2023 OT Individual Time: 4098-1191 OT Individual Time Calculation (min): 76 min    Short Term Goals: Week 3:  OT Short Term Goal 1 (Week 3): STGs = LTGs  Skilled Therapeutic Interventions/Progress Updates:  Pt greeted supine in bed, pt agreeable to OT intervention.      Transfers/bed mobility/functional mobility: pt completed supine<>sit MODI. Pt completed all functional ambulation with no AD and supervision.   ADLs:  Grooming: pt completed standing hand hygiene at sink with supervision, good integration of BUEs into ADLs noted.    Footwear: pt able to don shoes from EOB with set- up assist   Transfers: ambulatory toilet transfer with no AD and supervision Toileting: pt completed 3/3 toileting tasks with set-up assist as pt requested cloth to wipe with.    NMR: emphasis on implementing smooth motor movements into LUE as pt noted to elevate scapula anytime pt is performing functional reaching vs allowing the scapula to depress during shoulder flexion. Pt completed various activities to promote improved joint positioning and provide NMR to affected UE.  - pt transitioned into Sidelying with pt completing gravity eliminated active assist ROM with ball/UE ranger via elbow flexion/extension and shoulder flexion with a tactile cue of keeping shoulder down away from ear - pt completed seated Assisted reaching with visual feedback from mirror from EOM  - pt stood in front of elevated mat table with pt Weightbearing in to BUEs and stepping forward back wards to allow improved shoulder flexion from weightbearing position, MOD multimodal cues for positioning and body mechanics - pt completed Visual scanning and reaching task with LUE to grasp bean bags and match across midline to correct color, MOD multimodal cues to sequence optimal grasp on bean bags, I.e  extending digits fully before attempting to flex digits - pt completed horizontal shoulder internal/external rotation with pt holding ball at midline and then instructed to tap ball to R and L side with visual cue provided. Emphasis on visual scanning to R and L side   - pt able to Roll ball forward backwards on mat table from standing with support from RUE on table, pt cued to roll ball towards visual cue on mat  Ended session with pt seated EOB, handed pt off to next PT.   Therapy Documentation Precautions:  Precautions Precautions: Fall Precaution Comments: L inattention, cortrak Restrictions Weight Bearing Restrictions Per Provider Order: No  Pain: Intermittent pain reported in LUE, rest breaks and repositioning provided as needed.    Therapy/Group: Individual Therapy  Barron Schmid 12/28/2023, 12:18 PM

## 2023-12-28 NOTE — Progress Notes (Signed)
PROGRESS NOTE   Subjective/Complaints:  No issus overnite , eating better with D2 diet    ROS: Limited due to cognitive/behavioral, but denied CP, SOB, abd pain, n/v/d/c  Objective:   No results found.  Recent Labs    12/28/23 0532  WBC 5.5  HGB 14.7  HCT 41.0  PLT 196     Recent Labs    12/28/23 0532  NA 133*  K 4.1  CL 103  CO2 22  GLUCOSE 97  BUN 13  CREATININE 1.22  CALCIUM 8.5*           Intake/Output Summary (Last 24 hours) at 12/28/2023 0751 Last data filed at 12/27/2023 2109 Gross per 24 hour  Intake 540 ml  Output --  Net 540 ml        Physical Exam: Vital Signs Blood pressure (!) 115/59, pulse 67, temperature 98.2 F (36.8 C), temperature source Oral, resp. rate 18, height 5\' 10"  (1.778 m), weight 96.6 kg, SpO2 98%.  General: No acute distress, resting in bed watching TV Mood and affect are appropriate Heart: Regular rate and rhythm no rubs murmurs or extra sounds Lungs: Clear to auscultation, breathing unlabored, no rales or wheezes, no rales heard  Abdomen: Positive bowel sounds, soft nontender to palpation, nondistended Extremities: No clubbing, cyanosis, or edema Skin: No evidence of breakdown, no evidence of rash over exposed surfaces  PRIOR EXAMS: Neurologic: Cranial nerves II through XII intact, motor strength is 5/5 in right  deltoid, bicep, tricep, grip, hip flexor, knee extensors, ankle dorsiflexor and plantar flexor 3 to 3+ LUE, 4- to 4/5 LLE  Musculoskeletal: Musculoskeletal: Full range of motion in all 4 extremities. No joint swelling. Reduced active left shoulder abduction    Assessment/Plan: 1. Functional deficits which require 3+ hours per day of interdisciplinary therapy in a comprehensive inpatient rehab setting. Physiatrist is providing close team supervision and 24 hour management of active medical problems listed below. Physiatrist and rehab team continue to  assess barriers to discharge/monitor patient progress toward functional and medical goals  Care Tool:  Bathing    Body parts bathed by patient: Chest, Abdomen, Buttocks, Front perineal area, Right upper leg, Left upper leg, Face, Left arm   Body parts bathed by helper: Right arm, Right lower leg, Left lower leg     Bathing assist Assist Level: Moderate Assistance - Patient 50 - 74%     Upper Body Dressing/Undressing Upper body dressing   What is the patient wearing?: Pull over shirt    Upper body assist Assist Level: Moderate Assistance - Patient 50 - 74%    Lower Body Dressing/Undressing Lower body dressing      What is the patient wearing?: Incontinence brief, Pants     Lower body assist Assist for lower body dressing: Moderate Assistance - Patient 50 - 74%     Toileting Toileting    Toileting assist Assist for toileting: Minimal Assistance - Patient > 75%     Transfers Chair/bed transfer  Transfers assist     Chair/bed transfer assist level: Contact Guard/Touching assist     Locomotion Ambulation   Ambulation assist      Assist level: Contact Guard/Touching assist Assistive  device: No Device Max distance: 150'   Walk 10 feet activity   Assist     Assist level: Contact Guard/Touching assist Assistive device: No Device   Walk 50 feet activity   Assist    Assist level: Contact Guard/Touching assist Assistive device: No Device    Walk 150 feet activity   Assist    Assist level: Minimal Assistance - Patient > 75% Assistive device: No Device    Walk 10 feet on uneven surface  activity   Assist Walk 10 feet on uneven surfaces activity did not occur: Safety/medical concerns         Wheelchair     Assist Is the patient using a wheelchair?: Yes Type of Wheelchair: Manual    Wheelchair assist level: Dependent - Patient 0%      Wheelchair 50 feet with 2 turns activity    Assist        Assist Level: Dependent -  Patient 0%   Wheelchair 150 feet activity     Assist      Assist Level: Dependent - Patient 0%   Blood pressure (!) 115/59, pulse 67, temperature 98.2 F (36.8 C), temperature source Oral, resp. rate 18, height 5\' 10"  (1.778 m), weight 96.6 kg, SpO2 98%.  Medical Problem List and Plan: 1. 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  Antithrombotics: -DVT/anticoagulation:  Pharmaceutical: Lovenox 40mg  daily             -antiplatelet therapy: Aspirin 81 mg daily and Brilinta 90 mg twice daily 3. Pain Management: Tylenol as needed 4. Mood/Behavior/Sleep: Provide emotional support  - Told me he regrets not taking care of himself earlier in his life -1/17. Begin lexapro 5mg  qhs             -neuropsych eval when provider is available             -antipsychotic agents: N/A 5. Neuropsych/cognition: This patient is capable of making decisions on his own behalf. 6. Skin/Wound Care: Routine skin checks 7. Fluids/Electrolytes/Nutrition: Routine in and outs with follow-up chemistries on Monday   -BMET improved 1/20---push fluids, continue H2O flushes thru tube    Latest Ref Rng & Units 12/28/2023    5:32 AM 12/21/2023    5:38 AM 12/18/2023    7:37 AM  BMP  Glucose 70 - 99 mg/dL 97  161  096   BUN 8 - 23 mg/dL 13  23  26    Creatinine 0.61 - 1.24 mg/dL 0.45  4.09  8.11   Sodium 135 - 145 mmol/L 133  133  134   Potassium 3.5 - 5.1 mmol/L 4.1  4.2  4.3   Chloride 98 - 111 mmol/L 103  99  99   CO2 22 - 32 mmol/L 22  24  26    Calcium 8.9 - 10.3 mg/dL 8.5  8.7  8.8    8.  Dysphagia.  Dysphagia #1 nectar thick liquids.   -SLP rec repeat D2 trial -12/26/23 no insulin needed, CBGs fine, off TFs, will decrease CBG checks to  BID for now. May consider d/c'ing  checks this week if continues to do well  CBG (last 3)  Recent Labs    12/27/23 1634 12/27/23 2040 12/28/23 0537  GLUCAP 76 92 103*   9.  Hyperlipidemia.  Lipitor 20mg  daily 10.  GERD.  Nexium 20mg  daily 11.  Hypothyroidism.  Synthroid daily 12.  COPD exacerbation/respiratory distress.  Patient completed 5-day course of Unasyn.  Prednisone taper completed.   -pt on room air currently. 13. HTN?: unclear if this is a new diagnosis? No mention in notes. BPs elevated overnight. Monitor for now -1/23-26 controlled off meds  Vitals:   12/24/23 1309 12/24/23 1937 12/25/23 0448 12/25/23 1432  BP: (!) 125/48 135/69 (!) 120/58 129/72   12/25/23 1921 12/26/23 0317 12/26/23 1425 12/26/23 1948  BP: 127/63 115/80 (!) 154/76 123/69   12/27/23 0437 12/27/23 1512 12/27/23 1953 12/28/23 0429  BP: 134/74 133/68 123/86 (!) 115/59    14. Bowel regimen:    -last bm 1/16 , type 4. Continue colace  -12/27/23 LBM this morning, cont regimen  LOS: 17 days A FACE TO FACE EVALUATION WAS PERFORMED  Erick Colace 12/28/2023, 7:51 AM

## 2023-12-29 ENCOUNTER — Inpatient Hospital Stay (HOSPITAL_COMMUNITY): Payer: Medicaid Other

## 2023-12-29 LAB — GLUCOSE, CAPILLARY
Glucose-Capillary: 102 mg/dL — ABNORMAL HIGH (ref 70–99)
Glucose-Capillary: 113 mg/dL — ABNORMAL HIGH (ref 70–99)
Glucose-Capillary: 112 mg/dL — ABNORMAL HIGH (ref 70–99)
Glucose-Capillary: 90 mg/dL (ref 70–99)

## 2023-12-29 MED ORDER — ACETAMINOPHEN 325 MG PO TABS: 650.0000 mg | ORAL_TABLET | ORAL | Status: AC | PRN

## 2023-12-29 NOTE — Progress Notes (Signed)
PROGRESS NOTE   Subjective/Complaints:  No issus overnite , eating better with D2 diet    ROS: Limited due to cognitive/behavioral, but denied CP, SOB, abd pain, n/v/d/c  Objective:   No results found.  Recent Labs    12/28/23 0532  WBC 5.5  HGB 14.7  HCT 41.0  PLT 196     Recent Labs    12/28/23 0532  NA 133*  K 4.1  CL 103  CO2 22  GLUCOSE 97  BUN 13  CREATININE 1.22  CALCIUM 8.5*           Intake/Output Summary (Last 24 hours) at 12/29/2023 1610 Last data filed at 12/29/2023 0756 Gross per 24 hour  Intake 440 ml  Output --  Net 440 ml        Physical Exam: Vital Signs Blood pressure (!) 110/53, pulse 70, temperature (!) 97.3 F (36.3 C), resp. rate 18, height 5\' 10"  (1.778 m), weight 96.6 kg, SpO2 97%.  General: No acute distress, resting in bed watching TV Mood and affect are appropriate Heart: Regular rate and rhythm no rubs murmurs or extra sounds Lungs: Clear to auscultation, breathing unlabored, no rales or wheezes, no rales heard  Abdomen: Positive bowel sounds, soft nontender to palpation, nondistended Extremities: No clubbing, cyanosis, or edema Skin: No evidence of breakdown, no evidence of rash over exposed surfaces  PRIOR EXAMS: Neurologic: Cranial nerves II through XII intact, motor strength is 5/5 in right  deltoid, bicep, tricep, grip, hip flexor, knee extensors, ankle dorsiflexor and plantar flexor 3 to 3+ LUE, 4- to 4/5 LLE  Musculoskeletal: Musculoskeletal: Full range of motion in all 4 extremities. No joint swelling. Reduced active left shoulder abduction    Assessment/Plan: 1. Functional deficits which require 3+ hours per day of interdisciplinary therapy in a comprehensive inpatient rehab setting. Physiatrist is providing close team supervision and 24 hour management of active medical problems listed below. Physiatrist and rehab team continue to assess barriers to  discharge/monitor patient progress toward functional and medical goals  Care Tool:  Bathing    Body parts bathed by patient: Chest, Abdomen, Buttocks, Front perineal area, Right upper leg, Left upper leg, Face, Left arm   Body parts bathed by helper: Right arm, Right lower leg, Left lower leg     Bathing assist Assist Level: Moderate Assistance - Patient 50 - 74%     Upper Body Dressing/Undressing Upper body dressing   What is the patient wearing?: Pull over shirt    Upper body assist Assist Level: Moderate Assistance - Patient 50 - 74%    Lower Body Dressing/Undressing Lower body dressing      What is the patient wearing?: Incontinence brief, Pants     Lower body assist Assist for lower body dressing: Moderate Assistance - Patient 50 - 74%     Toileting Toileting    Toileting assist Assist for toileting: Supervision/Verbal cueing     Transfers Chair/bed transfer  Transfers assist     Chair/bed transfer assist level: Contact Guard/Touching assist     Locomotion Ambulation   Ambulation assist      Assist level: Contact Guard/Touching assist Assistive device: No Device Max distance: 150'  Walk 10 feet activity   Assist     Assist level: Contact Guard/Touching assist Assistive device: No Device   Walk 50 feet activity   Assist    Assist level: Contact Guard/Touching assist Assistive device: No Device    Walk 150 feet activity   Assist    Assist level: Minimal Assistance - Patient > 75% Assistive device: No Device    Walk 10 feet on uneven surface  activity   Assist Walk 10 feet on uneven surfaces activity did not occur: Safety/medical concerns         Wheelchair     Assist Is the patient using a wheelchair?: Yes Type of Wheelchair: Manual    Wheelchair assist level: Dependent - Patient 0%      Wheelchair 50 feet with 2 turns activity    Assist        Assist Level: Dependent - Patient 0%   Wheelchair  150 feet activity     Assist      Assist Level: Dependent - Patient 0%   Blood pressure (!) 110/53, pulse 70, temperature (!) 97.3 F (36.3 C), resp. rate 18, height 5\' 10"  (1.778 m), weight 96.6 kg, SpO2 97%.  Medical Problem List and Plan: 1. 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  Antithrombotics: -DVT/anticoagulation:  Pharmaceutical: Lovenox 40mg  daily             -antiplatelet therapy: Aspirin 81 mg daily and Brilinta 90 mg twice daily 3. Pain Management: Tylenol as needed 4. Mood/Behavior/Sleep: Provide emotional support  - Told me he regrets not taking care of himself earlier in his life -1/17. Begin lexapro 5mg  qhs             -neuropsych eval when provider is available             -antipsychotic agents: N/A 5. Neuropsych/cognition: This patient is capable of making decisions on his own behalf. 6. Skin/Wound Care: Routine skin checks 7. Fluids/Electrolytes/Nutrition: Routine in and outs with follow-up chemistries on Monday   -BMET improved 1/20---push fluids, continue H2O flushes thru tube    Latest Ref Rng & Units 12/28/2023    5:32 AM 12/21/2023    5:38 AM 12/18/2023    7:37 AM  BMP  Glucose 70 - 99 mg/dL 97  161  096   BUN 8 - 23 mg/dL 13  23  26    Creatinine 0.61 - 1.24 mg/dL 0.45  4.09  8.11   Sodium 135 - 145 mmol/L 133  133  134   Potassium 3.5 - 5.1 mmol/L 4.1  4.2  4.3   Chloride 98 - 111 mmol/L 103  99  99   CO2 22 - 32 mmol/L 22  24  26    Calcium 8.9 - 10.3 mg/dL 8.5  8.7  8.8    8.  Dysphagia.  Dysphagia #1 nectar thick liquids.   -SLP rec repeat D2 trial -12/26/23 no insulin needed, CBGs fine, off TFs, will decrease CBG checks to BID for now. May consider  d/c'ing checks this week if continues to  do well  CBG (last 3)  Recent Labs    12/28/23 1645 12/28/23 2047 12/29/23 0711  GLUCAP 100* 104* 102*   9.  Hyperlipidemia.  Lipitor 20mg  daily 10.  GERD.  Nexium 20mg  daily 11.  Hypothyroidism.  Synthroid daily 12.  COPD exacerbation/respiratory distress.  Patient completed 5-day course of Unasyn.  Prednisone taper completed.   -pt on room air currently. 13. HTN?: unclear if this is a new diagnosis? No mention in notes. BPs elevated overnight. Monitor for now -1/23-26 controlled off meds  Vitals:   12/25/23 1432 12/25/23 1921 12/26/23 0317 12/26/23 1425  BP: 129/72 127/63 115/80 (!) 154/76   12/26/23 1948 12/27/23 0437 12/27/23 1512 12/27/23 1953  BP: 123/69 134/74 133/68 123/86   12/28/23 0429 12/28/23 1607 12/28/23 2049 12/29/23 0527  BP: (!) 115/59 122/68 (!) 128/51 (!) 110/53    14. Bowel regimen:    -last bm 1/16 , type 4. Continue colace  -12/27/23 LBM this morning, cont regimen  LOS: 18 days A FACE TO FACE EVALUATION WAS PERFORMED  Erick Colace 12/29/2023, 8:08 AM

## 2023-12-29 NOTE — Procedures (Signed)
Modified Barium Swallow Study  Patient Details  Name: Ian Wong MRN: [id number] Date of Birth: 1961/09/27  Today's Date: 12/29/2023  Modified Barium Swallow completed.  Full report located under Chart Review in the Imaging Section.  History of Present Illness 63 yo male transfer from [facility] with L side weakness and R gaze deviation s/p TNK and thrombectomy 12/30. There was some concern for aspiration after vomiting episode during intubation so was kept on vent until 12/31. 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 Repeate CT 1/1 due to decreased wakefulness showed increasing mass effect on the R lateral ventricle.  PMH includes: GERD, hypothyroidism, seizures. MBS completed on 12/08/23 recommending nectar thick liquids. Repeat MBS to determine readiness to advance.   Clinical Impression Patient presents with mild oral dysphagia with a functional pharyngeal phase. Oral phase is characterized by reduced lingual control/coordination and prolonged bolus preparation resulting in occasional posterior spillage to the pyriform sinuses, prolonged mastication and trace-mild oral residuals. Oral residuals cleared through spontaneous additional swallow. Patient presents with largely functional pharyngeal phase remarkable for complete epiglottic inversion, laryngeal vestibule closure and adequate hyoid excursion.  There was x1 event of transient penetration during thin liquids, though no other events of penetration/aspiration present across consistencies. Suspected esophageal retention present during pill administration, though cleared through additional boluses of puree. Recommend continuation of dysphagia 2 solids with upgrade to thin liquids. SLP will continue to follow at bedside to trial upgraded solids. Medications should be given whole in puree. Continue to utilize standardized swallowing precautions including sitting upright at 90 degrees, slow rate, small bites/sips, along with checking  for oral residuals.      DIGEST Swallow Severity Rating*  Safety: 0  Efficiency: 0  Overall Pharyngeal Swallow Severity: WFL 1: mild; 2: moderate; 3: severe; 4: profound  *The Dynamic Imaging Grade of Swallowing Toxicity is standardized for the head and neck cancer population, however, demonstrates promising clinical applications across populations to standardize the clinical rating of pharyngeal swallow safety and severity.   Factors that may increase risk of adverse event in presence of aspiration Rubye Oaks & Clearance Coots 2021): Frail or deconditioned;Reduced cognitive function  Swallow Evaluation Recommendations Recommendations: PO diet PO Diet Recommendation: Dysphagia 2 (Finely chopped);Thin liquids (Level 0) Liquid Administration via: Cup;Spoon;Straw Medication Administration: Whole meds with puree Supervision: Staff to assist with self-feeding;Intermittent supervision/cueing for swallowing strategies Swallowing strategies  : Minimize environmental distractions;Slow rate;Small bites/sips;Check for pocketing or oral holding Postural changes: Position pt fully upright for meals;Stay upright 30-60 min after meals Oral care recommendations: Oral care BID (2x/day)    Amaris Delafuente M.A., CF-SLP 12/29/2023,9:40 AM

## 2023-12-29 NOTE — Progress Notes (Signed)
Occupational Therapy Session Note  Patient Details  Name: Ian Wong MRN: [id number] Date of Birth: Feb 04, 1961  Today's Date: 12/29/2023 OT Individual Time: 1449-1530 OT Individual Time Calculation (min): 41 min    Short Term Goals: Week 1:  OT Short Term Goal 1 (Week 1): pt will demonstrate improved left side awareness to don L arm and L leg in clothing first to follow through with hemi dressing strategies OT Short Term Goal 1 - Progress (Week 1): Met OT Short Term Goal 2 (Week 1): Pt will don shirt with min A to demonstrate improved memory of dressing techniques. OT Short Term Goal 2 - Progress (Week 1): Met OT Short Term Goal 3 (Week 1): Pt will use L hand to hold wash cloth and wash R arm with 50 % A. OT Short Term Goal 3 - Progress (Week 1): Met OT Short Term Goal 4 (Week 1): pt will ambulated to toilet with CGA with LRAD. OT Short Term Goal 4 - Progress (Week 1): Met  Skilled Therapeutic Interventions/Progress Updates:     Pt received sitting up in bed with wife present in room for family education focused session. Pt presenting to be in good spirits receptive to skilled OT session reporting 0/10 pain- OT offering intermittent rest breaks, repositioning, and therapeutic support to optimize participation in therapy session. Provided education on CVA etiology/recovery process and impact of CVA on Pt's functional status with emphasis on his L inattention and L visual field deficits. Educated on Pt's L UE deficits/tone management and simple home exercises to complete to support improved ROM and increased functional use of L UE- Pt completed exercises and demonstrated them to his wife during session to support carry over with OT providing verbal feedback for technique and bathroom safety. Provided education on fall prevention, energy conservation, and fall prevention with Pt and Pt wife receptive to education. Provided education on tub transfer technique and recommendations for set-up of  environment, grab bar use, and safety considerations with Pt receptive to education. Pt's wife observed tub transfer to learn technique and learn how to provide cues for Pt, however Pt's wife is not safe to provide physical assistance to Pt so no hands-on training provided. Education provided on follow-up therapy recommendations and DME recommendations with Pt and Pt's wife receptive to education. Pt and Pt's wife reporting all questions were met at end of session. Pt was left resting in bed with call bell in reach, bed alarm on, and all needs met.    Therapy Documentation Precautions:  Precautions Precautions: Fall Precaution Comments: L inattention, cortrak Restrictions Weight Bearing Restrictions Per Provider Order: No  Therapy/Group: Individual Therapy  Clide Deutscher 12/29/2023, 3:48 PM

## 2023-12-29 NOTE — Progress Notes (Signed)
Nutrition Follow-up  DOCUMENTATION CODES:   Not applicable  INTERVENTION:  Advance diet, as tolerated  Mighty Shake TID with meals, each supplement provides 330 kcals and 9 grams of protein - discontinue d/t diet advancement and pt not taking Continue Ensure Enlive po TID, each supplement provides 350 kcal and 20 grams of protein. Continue Magic cup BID with meals, each supplement provides 290 kcal and 9 grams of protein Needs new weight to assess trend - will order  NUTRITION DIAGNOSIS:  Inadequate oral intake related to dysphagia, acute illness as evidenced by estimated needs, meal completion < 50%. - new dx established as TF was discontinued  GOAL:  Patient will meet greater than or equal to 90% of their needs - ongoing  MONITOR:  PO intake, Supplement acceptance, Diet advancement, Weight trends  REASON FOR ASSESSMENT:  Consult Enteral/tube feeding initiation and management  ASSESSMENT:   Pt with hx of GERD and seizure disorder presented to ED with R gaze deviation and difficulty speaking. Imaging in ED suggestive of ischemic right MCA stroke.  12/30 - Intubated, TNKase and mechanical thrombectomy with right carotid stenting and angioplasty, NPO  12/31 - Extubated, NPO 1/2 - Hypertonic saline discontinued 1/3 - MBS, DYS1 diet, honey thick liquids, Cortrak placed  1/7 - MBS, DYS1 diet, nectar thick liquids, adjusted to nocturnal feeds 1/10 - DYS 2 diet, nectar thick liquids, transfer to CIR 1/11 - Swallow eval, DYS 1 diet, nectar thick liquids 1/13 - Nocturnal feeds initiated 1/21 - DYS 1 diet, nectar thick liquids 1/22 - [employer] pulled, DYS 2 diet, nectar thick liquids 1/28 - MBSS, DYS 2 diet, think liquids  Appetite picking up s/p advancement to dysphagia 2 diet. Advanced to thin liquids today s/p MBSS.  Averaging 63% documented intake x5 meals. Patient endorses no N/V/C/D during bedside visit. He is not consuming [employer] as much now that diet has been advanced.  Will D/C.   Recommended continued protein supplement intake if meal consumption inadequate to encourage continued functional progress with therapy and preservation of lean body mass.   Admit Weight: 89.4kg  Current Weight: 96.6kg  Needs new weight. Will order. No significant edema documented. Abdomen appears much less distended compared to last interaction. Bowels moving regularly. Baseline was once every other day PTA. Sodium marginally low. Asymptomatic.   Labs: Na+ 409>811>914 CBGs 97mg /dL (7/82) N5A 5.9 (21/3086)  Meds: docusate, esomeprazole, SSI, levothyroxine  Diet Order:   Diet Order             DIET DYS 2 Room service appropriate? Yes; Fluid consistency: Thin  Diet effective now            EDUCATION NEEDS:  Education needs have been addressed  Skin:  Skin Assessment: Skin Integrity Issues: Skin Integrity Issues:: Incisions Incisions: thigh  Last BM:  12/21/2023, type 4-medium  Height:  Ht Readings from Last 1 Encounters:  12/11/23 5\' 10"  (1.778 m)   Weight:  Wt Readings from Last 1 Encounters:  12/16/23 96.6 kg   Ideal Body Weight:  75.5 kg  BMI:  Body mass index is 30.56 kg/m.  Estimated Nutritional Needs:   Kcal:  2000-2250 kcal/day  Protein:  98-113 gm/day  Fluid:  2000-2250 mL/day  Myrtie Cruise MS, RD, LDN Registered Dietitian Clinical Nutrition RD Inpatient Contact Info in Amion

## 2023-12-29 NOTE — Progress Notes (Signed)
Physical Therapy Session Note  Patient Details  Name: Ian Wong MRN: [id number] Date of Birth: [birth year]  Today's Date: 12/29/2023 PT Individual Time: 1302-1404 PT Individual Time Calculation (min): 62 min   Short Term Goals: Week 2:  PT Short Term Goal 1 (Week 2): Pt will complete transfers with consistent supervision and LRAD PT Short Term Goal 1 - Progress (Week 2): Met PT Short Term Goal 2 (Week 2): Pt will ambulate >300' with supervision and LRAD. PT Short Term Goal 2 - Progress (Week 2): Progressing toward goal PT Short Term Goal 3 (Week 2): Pt will improve score of Berg Balance Scale by at least 7 points in order to demonstrate progress. PT Short Term Goal 3 - Progress (Week 2): Met PT Short Term Goal 4 (Week 2): Pt will ambulate ramp with overall supervision. PT Short Term Goal 4 - Progress (Week 2): Progressing toward goal Week 3:  PT Short Term Goal 1 (Week 3): LTG = STG d/t ELOS  Skilled Therapeutic Interventions/Progress Updates:  Patient seated upright on EOB on entrance to room. Pt's wife, Marylene Land, and family friend, Quitman Livings are present for family education. Patient alert and agreeable to PT session.   Patient with no pain complaint at start of session.  Wife unable to physically participate in training of CGA d/t sciatic pain.   Pt demonstrates bed mobility and sit<>stand transfers with IND. Most stand pivot transfers performed with distant supervision. Other wise rare vc required for reminder of LLE positioning.   Discussed OT's plan for use of shower chair in bathtub and pt to use large platform of tub to sit upon and then pivot in seat and walk BLE into tub. Pt can then stand pivot to chair that will be inside of tub.   Pt ambulated >150 ft to car to demonstrate car transfer to family as friend has transported wife into ortho gym via transport chair. Is able to complete car transfer with distant supervision with good technique and no cues.   Demos ability to  ambulate up/ down ramp in ortho gym with supervision and cues for increased L step height.  Wife and friend informed that pt's biggest impairments are arm mobility and overall reduced awareness of L side. Pt will require consistent reminders to take time to think out movements required prior to performance as this will allow for motor planning and processing to occur in brain for improved safety in mobility.   Patient seated on EOB at end of session with brakes locked, bed alarm set, and all needs within reach.   Therapy Documentation Precautions:  Precautions Precautions: Fall Precaution Comments: L inattention, cortrak Restrictions Weight Bearing Restrictions Per Provider Order: No  Pain:  No pain related this session. Pt very happy to see wife and friend this session with increased mood.   Therapy/Group: Individual Therapy  Loel Dubonnet PT, DPT, CSRS 12/29/2023, 10:19 AM

## 2023-12-29 NOTE — Progress Notes (Signed)
Physical Therapy Session Note  Patient Details  Name: Ian Wong MRN: [id number] Date of Birth: [birth year]  Today's Date: 12/29/2023 PT Individual Time: 0805-0832 PT Individual Time Calculation (min): 27 min   Short Term Goals: Week 3:  PT Short Term Goal 1 (Week 3): LTG = STG d/t ELOS  Skilled Therapeutic Interventions/Progress Updates: Patient supine in bed on entrance to room. Patient alert and agreeable to PT session.   Patient reported no pain during session  Therapeutic Activity: Bed Mobility: Pt performed supine<>sit on EOB with supervision.  Transfers: Pt performed sit<>stand transfers throughout session with supervision.  Gait Training:  Pt ambulated from room<>main gym using no AD with close supervision/light CGA. Pt with improved heel-toe on L LE (pt recalls previous cuing from sessions - min cuing at end of session heading back to room to maintain L step clearance).   Neuromuscular Re-ed: NMR facilitated during session with focus on proprioceptive feedback, dynamic standing balance, weight shifting. - Agility ladder - Pt laterally stepping to the L + pivoting out after stepping into next box with L LE leading (all the way down and back). Pt with CGA/light minA for safety. Pt required VC to increase L LE step length posteriorly to the L. PTA provided demonstration of sequence prior. Pt required VC to correct standing balance prior to advancing due to L lean (Pt aware of lean when PTA asked with pt self-correcting). Pt also required VC to initiate sequence throughout with weight shifting appropriately in order to increase lateral step to the L/pivoting to the L.   - At end of round, pt performed multiple reps of only pivoting L L out of box, then back in with improved length and placement as reps progressed.  NMR performed for improvements in motor control and coordination, balance, sequencing, judgement, and self confidence/ efficacy in performing all aspects of mobility  at highest level of independence.   Patient supine in bed at end of session with brakes locked, bed alarm set, and all needs within reach.      Therapy Documentation Precautions:  Precautions Precautions: Fall Precaution Comments: L inattention, cortrak Restrictions Weight Bearing Restrictions Per Provider Order: No  Therapy/Group: Individual Therapy  Dulse Rutan PTA 12/29/2023, 12:21 PM

## 2023-12-29 NOTE — Progress Notes (Signed)
Physical Therapy Session Note  Patient Details  Name: Ian Wong MRN: [id number] Date of Birth: 1961-01-12  Today's Date: 12/29/2023 PT Individual Time: 1127-1210 PT Individual Time Calculation (min): 43 min   Short Term Goals: Week 2:  PT Short Term Goal 1 (Week 2): Pt will complete transfers with consistent supervision and LRAD PT Short Term Goal 1 - Progress (Week 2): Met PT Short Term Goal 2 (Week 2): Pt will ambulate >300' with supervision and LRAD. PT Short Term Goal 2 - Progress (Week 2): Progressing toward goal PT Short Term Goal 3 (Week 2): Pt will improve score of Berg Balance Scale by at least 7 points in order to demonstrate progress. PT Short Term Goal 3 - Progress (Week 2): Met PT Short Term Goal 4 (Week 2): Pt will ambulate ramp with overall supervision. PT Short Term Goal 4 - Progress (Week 2): Progressing toward goal Week 3:  PT Short Term Goal 1 (Week 3): LTG = STG d/t ELOS  Skilled Therapeutic Interventions/Progress Updates:  Patient seated upright on EOB on entrance to room. Patient alert and agreeable to PT session.   Patient with no pain complaint at start of session.  Therapeutic Activity: Transfers: Pt performed sit<>stand with IND throughout session and stand pivot transfers with Mod I/ supervision. Provided no cueing for performance.  Gait Training/ NMR:  Pt ambulated >150' x2 to/frommain therapy gym with no AD and only intermittent cues for L step length and balance. Demonstrated mild path deviation.  Guided in navigation of 5" curb step with no AD. Pt continues to demo decreased focus to self, especially L side with impulsivity in attempt to perform as though he has no impairments. Pt initially steps up with LLE and when following vc to step down with LLE first, catches heel on step while therapist providing cues for stepping forward enough to clear step. Had pt stop prior to each performance and allow for mental processing prior to performance. With  this, he is able to improve technique and sequencing. Blocked practice x6 with slight improvement in safety throughout.   Pt guided in ambulation with use of 5# [place] with slight perturbations to UE during ambulation over 75' x4. Slight path deviations with good adjustments made for maintaining balance. Slight fatigue with LUE and slow lowering of L arm throughout.   NMR performed for improvements in motor control and coordination, balance, sequencing, judgement, and self confidence/ efficacy in performing all aspects of mobility at highest level of independence.   Patient seated on EOB at end of session with brakes locked, bed alarm set, and all needs within reach.   Therapy Documentation Precautions:  Precautions Precautions: Fall Precaution Comments: L inattention, cortrak Restrictions Weight Bearing Restrictions Per Provider Order: No  Pain:  No pain related this session.   Therapy/Group: Individual Therapy  Loel Dubonnet PT, DPT, CSRS 12/29/2023, 10:18 AM

## 2023-12-29 NOTE — Progress Notes (Signed)
Speech Language Pathology Daily Session Note  Patient Details  Name: Ian Wong MRN: [id number] Date of Birth: 10/24/1961  Today's Date: 12/29/2023 SLP Individual Time: 1540-1550 SLP Individual Time Calculation (min): 10 min  Short Term Goals: Week 3: SLP Short Term Goal 1 (Week 3): STGs= LTGs d/t ELOS  Skilled Therapeutic Interventions: PT notified SLP that family had questions regarding swallow study. SLP entered room and educated family on patients MBS results and subsequent D2/thin diet recommendations. SLP provided handouts regarding D2 textures and answered all questions as able. SLP educated family regarding patients current level of cognitive functioning and L inattention. Patient/family verbalized understanding. Patient left in bed with alarm set and call bell in reach. Continue POC.    Pain Pain Assessment Pain Scale: 0-10 Pain Score: 0-No pain  Therapy/Group: Individual Therapy  Kearstin Learn M.A., CF-SLP 12/29/2023, 3:55 PM

## 2023-12-29 NOTE — Progress Notes (Signed)
Physical Therapy Session Note  Patient Details  Name: Ian Wong MRN: [id number] Date of Birth: [birth year]  Today's Date: 12/28/2023 PT Individual Time:  6962-9528  PT Individual Time Calculation (min): 28 min  Short Term Goals: Week 2:  PT Short Term Goal 1 (Week 2): Pt will complete transfers with consistent supervision and LRAD PT Short Term Goal 1 - Progress (Week 2): Met PT Short Term Goal 2 (Week 2): Pt will ambulate >300' with supervision and LRAD. PT Short Term Goal 2 - Progress (Week 2): Progressing toward goal PT Short Term Goal 3 (Week 2): Pt will improve score of Berg Balance Scale by at least 7 points in order to demonstrate progress. PT Short Term Goal 3 - Progress (Week 2): Met PT Short Term Goal 4 (Week 2): Pt will ambulate ramp with overall supervision. PT Short Term Goal 4 - Progress (Week 2): Progressing toward goal Week 3:  PT Short Term Goal 1 (Week 3): LTG = STG d/t ELOS  Skilled Therapeutic Interventions/Progress Updates:  Patient seated upright on EOB on entrance to room. Completing OT session with supervision of water consumption. Patient alert and agreeable to PT session.   Patient with no pain complaint at start of session. Provided with supervision to drink water to pt's fill. Saves rest for later at lunch.  Therapeutic Activity: Transfers: Pt performed sit<>stand and stand pivot transfers throughout session with close supervision. Cueing only for increased use of LUE and turn to locate chair with turn to L.  Gait Training/ NMR:  Pt ambulated >200 ft x2 using no AD with close supervision/ CGA. Demonstrated improving focus intermittently to LLE and heel-to-toe progression. Provided minimal cues for technique/ safety. Guided during ambulation to stop at different portions of hallway and locate specific pictures on wall, then touch with LUE.   Neuromuscular Re-ed: NMR facilitated during session with focus on motor control of LUE and standing balance. Pt  guided in attempt to use finger ladder by placing fingertips of flat palm on each rung of ladder. When reaching 75-90* tone increases and unable to dissociate scapular movement with arm movement. Attempt for slow progression of shoulder into flexion and then into abduction with block of scapula but scapular movement initiates very early and tone increases despite slow movements.  NMR performed for improvements in motor control and coordination, balance, sequencing, judgement, and self confidence/ efficacy in performing all aspects of mobility at highest level of independence.   Patient seated on EOB at end of session with brakes locked, bed alarm set, and all needs within reach. Set up for upcoming lunch tray arrival.    Therapy Documentation Precautions:  Precautions Precautions: Fall Precaution Comments: L inattention, cortrak Restrictions Weight Bearing Restrictions Per Provider Order: No  Pain:  No pain related this session.   Therapy/Group: Individual Therapy  Loel Dubonnet PT, DPT, CSRS 12/29/2023, 7:19 AM

## 2023-12-30 ENCOUNTER — Other Ambulatory Visit (HOSPITAL_COMMUNITY): Payer: Self-pay

## 2023-12-30 LAB — GLUCOSE, CAPILLARY
Glucose-Capillary: 111 mg/dL — ABNORMAL HIGH (ref 70–99)
Glucose-Capillary: 104 mg/dL — ABNORMAL HIGH (ref 70–99)
Glucose-Capillary: 97 mg/dL (ref 70–99)
Glucose-Capillary: 87 mg/dL (ref 70–99)

## 2023-12-30 MED ORDER — ESCITALOPRAM OXALATE 5 MG PO TABS
5.0000 mg | ORAL_TABLET | Freq: Every day | ORAL | 0 refills | Status: AC
  Filled 2023-12-30: qty 30, 30d supply, fill #0

## 2023-12-30 MED ORDER — TICAGRELOR 90 MG PO TABS
90.0000 mg | ORAL_TABLET | Freq: Two times a day (BID) | ORAL | 0 refills | Status: DC
  Filled 2023-12-30: qty 60, 30d supply, fill #0

## 2023-12-30 MED ORDER — DOCUSATE SODIUM 50 MG/5ML PO LIQD: 100.0000 mg | Freq: Every day | ORAL | Status: DC

## 2023-12-30 MED ORDER — ATORVASTATIN CALCIUM 20 MG PO TABS
20.0000 mg | ORAL_TABLET | Freq: Every evening | ORAL | 0 refills | Status: AC
  Filled 2023-12-30: qty 30, 30d supply, fill #0

## 2023-12-30 MED ORDER — LEVOTHYROXINE SODIUM 50 MCG PO TABS
50.0000 ug | ORAL_TABLET | Freq: Every day | ORAL | 0 refills | Status: AC
  Filled 2023-12-30: qty 30, 30d supply, fill #0

## 2023-12-30 MED ORDER — OLOPATADINE HCL 0.1 % OP SOLN
1.0000 [drp] | Freq: Two times a day (BID) | OPHTHALMIC | 12 refills | Status: DC
  Filled 2023-12-30: qty 5, 30d supply, fill #0

## 2023-12-30 MED ORDER — ESOMEPRAZOLE MAGNESIUM 20 MG PO CPDR
20.0000 mg | DELAYED_RELEASE_CAPSULE | Freq: Every day | ORAL | 0 refills | Status: AC
  Filled 2023-12-30: qty 30, 30d supply, fill #0

## 2023-12-30 NOTE — Progress Notes (Signed)
Occupational Therapy Session Note  Patient Details  Name: Ian Wong MRN: [id number] Date of Birth: [birth year]  Today's Date: 12/30/2023 OT Individual Time: 0900-1000 OT Individual Time Calculation (min): 60 min    Short Term Goals: Week 3:  OT Short Term Goal 1 (Week 3): STGs = LTGs  Skilled Therapeutic Interventions/Progress Updates:    Pt received in bed and ready to shower. Pt instructed to gather his clothing from his drawers and get prepared for the shower.  Pt was able to get up and ambulate to drawers by stepping through a tight space and having to step over a pair of shoes.  Pt had to go back to the drawers several times, stepping over the shoes at least 4x with good foot clearance and L side awareness with no cues needed.   Pt ambulated to shower, needed A to set water temperature.  Pt then showered using shower seat for most of the shower, stood to wash hips all with S and cues to ensure soap was fully rinsed off.   Pt ambulated to bed to dress.  No cues needed with donning shirt, 1 cue to ensure his underwear were fully pulled up and cues to integrate use of his L hand.  Stood at sink to complete oral care.    Pt then worked on AROM of LUE with grasp and release activities.  He continues to need to put mental focus and attention to L hand to fully open and attend to hand.   Pt encouraged to keep working on his HEP.  Pt resting in bed with all needs met and alarm set.   Therapy Documentation Precautions:  Precautions Precautions: Fall Precaution Comments: L inattention, cortrak Restrictions Weight Bearing Restrictions Per Provider Order: No    Vital Signs: Therapy Vitals Temp: 97.8 F (36.6 C) Pulse Rate: 74 Resp: 18 BP: 113/67 Patient Position (if appropriate): Lying Oxygen Therapy SpO2: 97 % O2 Device: Room Air Pain: Pain Assessment Pain Scale: 0-10 Pain Score: 0-No pain ADL: ADL Eating: Supervision/safety Grooming: Supervision/safety Where  Assessed-Grooming: Standing at sink Upper Body Bathing: Supervision/safety Where Assessed-Upper Body Bathing: Shower Lower Body Bathing: Supervision/safety Where Assessed-Lower Body Bathing: Shower Upper Body Dressing: Supervision/safety, Minimal cueing Where Assessed-Upper Body Dressing: Edge of bed Lower Body Dressing: Supervision/safety, Minimal cueing Where Assessed-Lower Body Dressing: Edge of bed Toileting: Modified independent Where Assessed-Toileting: Teacher, adult education: modified Community education officer Method: Proofreader: Engineer, technical sales: Close supervison, Moderate cueing Film/video editor: Close supervision Film/video editor Method: Designer, industrial/product: Grab bars, Transfer tub bench   Therapy/Group: Individual Therapy  [name] 12/30/2023, 8:46 AM

## 2023-12-30 NOTE — Progress Notes (Signed)
Occupational Therapy Discharge Summary  Patient Details  Name: Ian Wong MRN: [id number] Date of Birth: July 19, 1961  Date of Discharge from OT service:December 30, 2023  Patient has met 14 of 14 long term goals due to improved balance, postural control, ability to compensate for deficits, functional use of  LEFT upper and LEFT lower extremity, improved attention, improved awareness, and improved coordination.    Patient to discharge at overall Supervision level.    Patient's care partner is his spouse who in normal circumstances is able to provide the necessary  assistance at discharge.  physical and cognitive. She has recently been having difficulties with sciatica so can only provide distant supervision.  Family provided with written HEP and recommendations.    Reasons goals not met: n/a  - pt is adequate for discharge with goal of eating independently using hands but needs supervision for swallowing,  pt is adequate for discharge with using LUE at diminished level but needs cues to do so due to L inattention   Recommendation:  Patient will benefit from ongoing skilled OT services in home health setting to continue to advance functional skills in the area of BADL. In the future pt would benefit from aquatic therapy to assist pt with shoulder mobility as he has increased tone in shoulder with limited PROM.  Pt is at high risk for frozen shoulder and developing shoulder pain.   Equipment: Shower seat  Reasons for discharge: treatment goals met  Patient/family agrees with progress made and goals achieved: Yes  OT Discharge Precautions/Restrictions  Precautions Precautions: Fall Precaution Comments: L inattention Restrictions Weight Bearing Restrictions Per Provider Order: No  ADL ADL Eating: Supervision/safety Grooming: Supervision/safety Where Assessed-Grooming: Standing at sink Upper Body Bathing: Supervision/safety Where Assessed-Upper Body Bathing: Shower Lower Body  Bathing: Supervision/safety Where Assessed-Lower Body Bathing: Shower Upper Body Dressing: Supervision/safety, Minimal cueing Where Assessed-Upper Body Dressing: Edge of bed Lower Body Dressing: Supervision/safety, Minimal cueing Where Assessed-Lower Body Dressing: Edge of bed Toileting: Modified independent Where Assessed-Toileting: Teacher, adult education: Mod independent Statistician Method: Proofreader: Engineer, technical sales: Close supervison, Moderate cueing Film/video editor: Close supervision Film/video editor Method: Designer, industrial/product: Grab bars, Sales promotion account executive Baseline Vision/History: 1 Wears glasses Patient Visual Report: No change from baseline Vision Assessment?: Wears glasses for reading;Wears glasses for driving;Yes Eye Alignment: Within Functional Limits Ocular Range of Motion: Restricted on the left Alignment/Gaze Preference: Other (comment) (improved from admission, uses head turns to gaze left) Tracking/Visual Pursuits: Decreased smoothness of horizontal tracking;Decreased smoothness of vertical tracking;Left eye does not track laterally;Decreased smoothness of eye movement to LEFT superior field;Decreased smoothness of eye movement to LEFT inferior field Convergence: Impaired (comment) Visual Fields: Left visual field deficit Perception  Perception: Impaired Perception-Other Comments: improved from admission,  pt now scanning to the left to see items in L  visual field Praxis Praxis: WFL Cognition Cognition Overall Cognitive Status: Impaired/Different from baseline Arousal/Alertness: Awake/alert Person: Oriented Place: Oriented Situation: Oriented Memory: Impaired Memory Impairment: Storage deficit Sustained Attention: Impaired Sustained Attention Impairment: Verbal basic;Functional basic Awareness: Impaired Awareness Impairment: Emergent impairment Problem Solving:  Impaired Problem Solving Impairment: Verbal complex;Functional complex Safety/Judgment: Impaired Brief Interview for Mental Status (BIMS) Repetition of Three Words (First Attempt): 3 Temporal Orientation: Year: Correct Temporal Orientation: Month: Accurate within 5 days Temporal Orientation: Day: Correct Recall: "Sock": Yes, no cue required Recall: "Blue": Yes, no cue required Recall: "Bed": Yes, no cue required BIMS Summary Score: 15 Sensation Sensation Light Touch: Impaired  by gross assessment Hot/Cold: Impaired by gross assessment Proprioception: Impaired by gross assessment Coordination Gross Motor Movements are Fluid and Coordinated: No Fine Motor Movements are Fluid and Coordinated: No Motor  Motor Motor: Other (comment) (Hemiparesis) Motor - Discharge Observations: Hemiparesis L UE/LE Mobility    Close supervision with ADL transfers except mod Ind with toilet transfers   Trunk/Postural Assessment  Postural Control Righting Reactions: delayed and inadequate Protective Responses: decreased  Balance Berg Balance Test Sit to Stand: Able to stand without using hands and stabilize independently Standing Unsupported: Able to stand 2 minutes with supervision Sitting with Back Unsupported but Feet Supported on Floor or Stool: Able to sit safely and securely 2 minutes Stand to Sit: Sits safely with minimal use of hands Transfers: Able to transfer with verbal cueing and /or supervision Standing Unsupported with Eyes Closed: Able to stand 10 seconds with supervision Standing Ubsupported with Feet Together: Able to place feet together independently and stand for 1 minute with supervision From Standing, Reach Forward with Outstretched Arm: Can reach confidently >25 cm (10") From Standing Position, Pick up Object from Floor: Able to pick up shoe, needs supervision From Standing Position, Turn to Look Behind Over each Shoulder: Looks behind one side only/other side shows less weight  shift Turn 360 Degrees: Able to turn 360 degrees safely in 4 seconds or less Standing Unsupported, Alternately Place Feet on Step/Stool: Able to complete >2 steps/needs minimal assist Standing Unsupported, One Foot in Front: Able to plae foot ahead of the other independently and hold 30 seconds Standing on One Leg: Able to lift leg independently and hold equal to or more than 3 seconds Total Score: 43 Static Sitting Balance Static Sitting - Balance Support: Feet unsupported;No upper extremity supported Static Sitting - Level of Assistance: 7: Independent Dynamic Sitting Balance Dynamic Sitting - Balance Support: No upper extremity supported;Feet unsupported Dynamic Sitting - Level of Assistance: 7: Independent Dynamic Sitting - Balance Activities: Reaching across midline Sitting balance - Comments: Pt able to find midline (slight lean to the L) and able to correct with VC and no LOB Static Standing Balance Static Standing - Balance Support: No upper extremity supported Static Standing - Level of Assistance: 5: Stand by assistance Dynamic Standing Balance Dynamic Standing - Balance Support: During functional activity;No upper extremity supported Dynamic Standing - Level of Assistance: 5: Stand by assistance Dynamic Standing - Balance Activities: Reaching across midline;Lateral lean/weight shifting Extremity/Trunk Assessment RUE Assessment RUE Assessment: Within Functional Limits LUE Assessment Passive Range of Motion (PROM) Comments: sh flexion to 90 Active Range of Motion (AROM) Comments: shoulder flexion 70 degrees, abd 60, elbow flexion full AROM, limited wrist extension, finger extension full ROM but pt has to concentrate on his L hand opening to compensate for increased tone General Strength Comments: grasp 3+/5 Brunstrum level for arm: Stage IV Movement is deviating from synergy Brunstrum level for hand: Stage IV Movements deviating from synergies LUE Tone LUE Tone:  Moderate  FAST-UL Outcome Measure  Hand-to-mouth (HtM) Movement Starting Position: Participant seated on a standard chair without armrests. Trunk leaning on back support of chair. Both hands placed in pronated position on the ipsilateral middle thigh. Feet placed flat on the floor. If participants have any difficulty in understanding instructions (i.e. aphasia) a visual demonstration is suggested. For each of the 5 tasks of the FAST-UL, the subject at first performs the movement with the less affected UL and then with the affected one. The movement can be repeated 3 times and the best  score of the three attempts is assigned.   Instructions: Each subject is asked to move the hand towards the mouth, touch it with fingertips and return to the thigh. Motor task occurs without moving the trunk off the back support and without moving the head toward the hand.   Scoring: Clinical score from 0 to 3 is provided by comparing affected side with less affected one as follows: 0 = no movement at all. 1 = The movement task is not completed (less of 50% of the contralateral HtM movement). 2 = The movement task is not completed (more of 50% of the contralateral HtM movement but the mouth is not reached) or the movement task is completed with compensations. If the mouth is touched with the wrist or the palm or the movement is performed with head or trunk compensations (flexion of the head and trunk towards the hand) the score is 2.   3 = movement carried out at 100% of the contralateral HtM movement. HtM occurs with adequate shoulder flexion and abduction, elbow flexion, and forearm supination. The mouth is touched with fingertips.  Patient Score: 3   Reach to Target (RtT) Movement Starting Position: Same starting conditions of HtM movement. Instructions: Each subject is asked to move the hand toward a target (i.e. the hand of the examiner) located in front of the subject in the ipsilateral workspace at  shoulder height, at a distance corresponding to 100% of the fully extended UL within arm's reach (less affected arm as reference). Participants have to reach, touch the target, and return. Motor task occurs without moving the trunk off the back support. Scoring: Clinical score from 0 to 3 is provided by comparing affected side with less affected one as follows: 0 = no movement at all. 1 = The movement task is not completed (less of 50% of the contralateral RtT movement).  2 = The movement task is not completed (more of 50% of the contralateral RtT movement but the target is not reached) or the movement task is completed with compensations (i.e. the trunk loses contact with the back support of the chair with forward displacement, shoulder flexion occurs with excessive scapular elevation, or shoulder excessive abduction). If the target is reached with trunk or shoulder compensations for inadequate elbow and finger extension the score is 2.  3 = movement performed at 100% of the contralateral RtT. The target is reached with adequate shoulder flexion, elbow, wrist and finger extension.  Patient Score: 2   Prono-supination (PS) Movement Starting Position: Same starting conditions of HtM movement. Instructions: Motor task occurs without moving the trunk anteriorly or laterally, the medial side of the humerus is against the body, the forearm is fully pronated with the hand resting on the thigh. Scoring: Clinical score from 0 to 3 is provided by comparing paretic side with less affected one as follows: 0 = no movement at all. 1 = The movement task is not completed (less of 50% of the contralateral PS movement).  2 = The movement task is not completed (more of 50% of the contralateral PS movement but the forearm is not fully supinated) or the movement task is completed with compensations (i.e. excessive trunk inclination, shoulder abduction). If the movement is completed with compensations at elbow,  shoulder or trunk level the score is 2. 3 = movement performed at 100% of the contralateral PS (complete supination of the forearm with the dorsal part of the hand in contact with the thigh).   Patient Score: 3  Grasp and Release (GaR) Movement Starting position: Participant seated on a standard chair. Hip and knees in 90 flexion, feet flat on the floor. Upper limb (UL) resting on a table in front of the participant with approximately 90 elbow flexion, forearm pronated and fingers in a relaxed extended and adducted position.  Instructions: The subject performs a grasping movement of a cylindrical rigid glass (at least 6 cm diameter) placed proximally to an imaginary line connecting the distal joints of thumb and index finger. The subject is asked to grasp the glass, lift it at least 2 cm (elbow remains in contact with the table), and release it. Scoring:  Clinical score from 0 to 3 is provided by comparing affected side with less affected one as follows: 0 = No movement. The grasp is not possible. 1 = The movement task is not completed (less of 50% of the task). Some prehension is possible but the grasp is not sufficiently stable to lift the object; the grasp can be performed with the use of the less affected hand only to stabilize the glass for inadequate hand/finger opening and the release is not possible. Some hand opening is required otherwise the score is 0. 2 = The movement task is not completed (more of 50% of the task). The object is grasped and lifted but it falls or the task is completed using alternative grasping strategies (i.e. multi-pulpar, palmar, digito palmar; grasping and releasing of the object is possible with abnormal orientation of the wrist and fingers toward the object and the forearm is lifted off the table). 3 = The task is completed using the expected pattern (normal orientation of fingers or wrist toward the object, the grasp occurs with thumb and fingers in opposition,  forearm supination, elbow flexion; thumb abduction and finger extension to release the object).  Patient Score: 1   Pinch and Release (PaR) Movement Starting position: Same starting conditions of GaR movement The participant performs a PaR movement of a pen placed on a table in the midline of an imaginary line connecting the distal joints of thumb and index finger. The participants asked to pinch the pen with the tips of thumb and index finger, lift it at least 2 cm (elbow remains in contact with the table), and release it. Clinical score from 0 to 3 is provided by comparing affected side with non-affected one as follows: 0 = No movement. The pinch is not possible. 1 = The movement task is not completed (less of 50% of the task). Some prehension is possible but the pinch is not sufficiently stable to lift the object; the pinch occurs with the use of the less affected hand to stabilize the object for inadequate finger opening and the release is not possible. Some fingers movement is required otherwise the score is 0. 2 = The movement task is not completed (more of 50% of the task). The object is pinched and lifted but it falls or the task is completed using alternative pinching strategies (e.g. pinching with all the fingers, tripod pinch, pinching and releasing of the object is possible with abnormal orientation of fingers and wrist toward the object and the forearm is lifted off the table). 3 = The task is completed using the expected pattern (normal orientation of fingers or wrist toward the object, the pinch occurs with opposition of pads of index finger and thumb, and wrist extension).  Patient Score: 3  Total score: 12/15  (vs 7/15 on admission)  Kitzia Camus 12/30/2023, 12:38 PM

## 2023-12-30 NOTE — Progress Notes (Addendum)
Patient ID: Ian Wong, male   DOB: 12/05/1960, 63 y.o.   MRN: [id number]  1101-SW spoke with pt wife to provide updates from team conference, d/c date remains tomorrow, and reviewed discharge process. Confirms his sister will pick him up tomorrow. She will follow-up if he will be a late discharge. She is aware all information for HHA is in discharge instructions. She states she spoke with rep from insurance about PCS and pt was approved for 20 hours per week. No further questions/concerns reported.   *SW met with pt in room to review discharge.   Cecile Sheerer, MSW, LCSW Office: [phone] Cell: [phone] Fax: [phone]

## 2023-12-30 NOTE — Progress Notes (Signed)
Physical Therapy Session Note  Patient Details  Name: Ian Wong MRN: [id number] Date of Birth: 1961/06/19  Today's Date: 12/30/2023 PT Individual Time: 0805-0900 PT Individual Time Calculation (min): 55 min   Short Term Goals: Week 3:  PT Short Term Goal 1 (Week 3): LTG = STG d/t ELOS  Skilled Therapeutic Interventions/Progress Updates: Patient sitting EOB on entrance to room. Patient alert and agreeable to PT session.   Patient reported no pain during session.   Therapeutic Activity: Transfers: Pt performed sit<>stand transfers throughout session with supervision. Pt sitting EOB donning personal shoes. Pt continues to require VC to adjust L shoe due to tongue being squished to bottom with pt not acknowledging to fix.   - Pt performed stair navigation (12) in stairway outside of main gym with supervision and VC for sequence (ascend with R LE, descend with L LE - mod cues for pt to recall and to only perform step to pattern vs reciprocal for safety). Pt with use of one railing on R side. Pt with also VC to increase L hip flexion to clear step, and to ensure entire foot is on step by tapping bottom of next step when ascending, or heel of next step when descending.   - Pt performed car transfer with supervision and VC for sequence. Pt attempted to step into car with L LE first, but was cued to pivot and to flex at hips with glutes leading to seat as to avoid increase to fall risk. - Pt performed ramp navigation with use of railing and with supervision.   Patient demonstrates increased fall risk as noted by score of 43/56 on Berg Balance Scale.  (<36= high risk for falls, close to 100%; 37-45 significant >80%; 46-51 moderate >50%; 52-55 lower >25%)  Gait Training:  Pt ambulated throughout session (300'+) using no AD with supervision. Pt with decrease in weight shift to the R that requires VC to increase for safe clearance on L LE. Pt with L heel-strike that decreased at end of session  with pt reporting some fatigue. Pt educated to take breaks at home when feeling tired as to avoid increase in fall risks. Pt also presented with hugging L side of hallway and doorways when turning into another room due to decreased attention to L with further education required for pt to increase awareness when at home as family cannot provide adequate supervision.   Patient sitting EOB at end of session with brakes locked, bed alarm set, and all needs within reach.      Therapy Documentation Precautions:  Precautions Precautions: Fall Precaution Comments: L inattention Restrictions Weight Bearing Restrictions Per Provider Order: No  Therapy/Group: Individual Therapy  Seamus Warehime PTA 12/30/2023, 12:59 PM

## 2023-12-30 NOTE — Progress Notes (Signed)
Physical Therapy Session Note  Patient Details  Name: Ian Wong MRN: [id number] Date of Birth: [birth year]  Today's Date: 12/30/2023 PT Individual Time: 4098-1191 PT Individual Time Calculation (min): 57 min   Short Term Goals: Week 2:  PT Short Term Goal 1 (Week 2): Pt will complete transfers with consistent supervision and LRAD PT Short Term Goal 1 - Progress (Week 2): Met PT Short Term Goal 2 (Week 2): Pt will ambulate >300' with supervision and LRAD. PT Short Term Goal 2 - Progress (Week 2): Progressing toward goal PT Short Term Goal 3 (Week 2): Pt will improve score of Berg Balance Scale by at least 7 points in order to demonstrate progress. PT Short Term Goal 3 - Progress (Week 2): Met PT Short Term Goal 4 (Week 2): Pt will ambulate ramp with overall supervision. PT Short Term Goal 4 - Progress (Week 2): Progressing toward goal Week 3:  PT Short Term Goal 1 (Week 3): LTG = STG d/t ELOS  Skilled Therapeutic Interventions/Progress Updates:  Patient supine in bed on entrance to room. Patient alert and agreeable to PT session.   Patient with no pain complaint at start of session.  Therapeutic Activity: Performs all bed mobility with IND. Transfers with IND/ intermittent distant supervision.   In therapy gym, pt provided with verbal instructions and visual demonstration to complete floor transfer.  Practiced version to come to side and bring hips up from floor from sidelying to quadruped and then version of lying prone, then pushing up to elbows and walking hips back, then pushing up to quadruped. Pt allowed to fail with L UE initially in order to increase awareness of need for increase in effort. Then able to perform with close supervision to reach quadruped, then tall kneeling, then half kneel bringing RLE forward and push from both LE to sit on mat table. Prone position performed x2 with close supervision.   Gait Training/ NMR:  Pt ambulated 160' x2 using no AD with  supervision. Demonstrated intermittent need for cue to progress L foot with heel-to-toe technique.  Guided in curb step training to 5" curb step in main gym. No AD. Pt provided with verbal instructions with visual demonstration of performance to provide time for motor planning and increased processing time. Pt continues to require vc prior to stepping down with LLE in order to clear L heel from step. Performed x20 with improving clearance with blocked practice but ultimately may require vc for awareness to clear step at home.   Guided pt in carrying of laundry basket filled with folded towels back to room over 160 ft. Is cued to focus on L hand grasp of basket handle and able to reach room and place basket on chair in room with supervision.   NMR performed for improvements in motor control and coordination, balance, sequencing, judgement, and self confidence/ efficacy in performing all aspects of mobility at highest level of independence.   Patient supine in bed at end of session with brakes locked, bed alarm set, and all needs within reach.   Therapy Documentation Precautions:  Precautions Precautions: Fall Precaution Comments: L inattention Restrictions Weight Bearing Restrictions Per Provider Order: No  Pain:  No pain related this session.    Therapy/Group: Individual Therapy  Loel Dubonnet PT, DPT, CSRS 12/30/2023, 5:11 PM

## 2023-12-30 NOTE — Progress Notes (Signed)
PROGRESS NOTE   Subjective/Complaints:  No issues overnite , seen in PT, able to walk on ramp and do car transfers   ROS: Limited due to cognitive/behavioral, but denied CP, SOB, abd pain, n/v/d/c  Objective:   DG Swallowing Func-Speech Pathology Result Date: 12/29/2023 Table formatting from the original result was not included. Modified Barium Swallow Study Patient Details Name: Ian Wong MRN: [id number] Date of Birth: Jul 14, 1961 Today's Date: 12/29/2023 HPI/PMH: HPI: 63 yo male transfer from [facility] with L side weakness and R gaze deviation s/p TNK and thrombectomy 12/30. There was some concern for aspiration after vomiting episode during intubation so was kept on vent until 12/31. 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 Repeate CT 1/1 due to decreased wakefulness showed increasing mass effect on the R lateral ventricle.  PMH includes: GERD, hypothyroidism, seizures. MBS completed on 12/08/23 recommending nectar thick liquids. Repeat MBS to determine readiness to advance. Clinical Impression: Patient presents with mild oral dysphagia with a functional pharyngeal phase. Oral phase is characterized by reduced lingual control/coordination and prolonged bolus preparation resulting in occasional posterior spillage to the pyriform sinuses, prolonged mastication and trace-mild oral residuals. Oral residuals cleared through spontaneous additional swallow. Patient presents with largely functional pharyngeal phase remarkable for complete epiglottic inversion, laryngeal vestibule closure and adequate hyoid excursion.  There was x1 event of transient penetration during thin liquids, though no other events of penetration/aspiration present across consistencies. Suspected esophageal retention present during pill administration, though cleared through additional boluses of puree. Recommend continuation of dysphagia 2 solids with upgrade to thin  liquids. SLP will continue to follow at bedside to trial upgraded solids. Medications should be given whole in puree. Continue to utilize standardized swallowing precautions including sitting upright at 90 degrees, slow rate, small bites/sips, along with checking for oral residuals.   Factors that may increase risk of adverse event in presence of aspiration Rubye Oaks & Clearance Coots 2021): Factors that may increase risk of adverse event in presence of aspiration Rubye Oaks & Clearance Coots 2021): Frail or deconditioned; Reduced cognitive function Recommendations/Plan: Swallowing Evaluation Recommendations Swallowing Evaluation Recommendations Recommendations: PO diet PO Diet Recommendation: Dysphagia 2 (Finely chopped); Thin liquids (Level 0) Liquid Administration via: Cup; Spoon; Straw Medication Administration: Whole meds with puree Supervision: Staff to assist with self-feeding; Intermittent supervision/cueing for swallowing strategies Swallowing strategies  : Minimize environmental distractions; Slow rate; Small bites/sips; Check for pocketing or oral holding Postural changes: Position pt fully upright for meals; Stay upright 30-60 min after meals Oral care recommendations: Oral care BID (2x/day) Treatment Plan Treatment Plan Treatment recommendations: Therapy as outlined in treatment plan below Follow-up recommendations: Outpatient SLP Recommendations Comment: n/a Functional status assessment: Patient has had a recent decline in their functional status and demonstrates the ability to make significant improvements in function in a reasonable and predictable amount of time. Treatment frequency: Min 2x/week Treatment duration: 2 weeks Interventions: Aspiration precaution training; Compensatory techniques; Patient/family education; Trials of upgraded texture/liquids; Diet toleration management by SLP; Respiratory muscle strength training Recommendations Recommendations for follow up therapy are one component of a multi-disciplinary  discharge planning process, led by the attending physician.  Recommendations may be updated based on patient status, additional functional  criteria and insurance authorization. Assessment: Orofacial Exam: Orofacial Exam Oral Cavity: Oral Hygiene: WFL Oral Cavity - Dentition: Missing dentition Orofacial Anatomy: WFL Anatomy: No data recorded Boluses Administered: Boluses Administered Boluses Administered: Thin liquids (Level 0); Puree; Solid  Oral Impairment Domain: Oral Impairment Domain Lip Closure: No labial escape Tongue control during bolus hold: Escape to lateral buccal cavity/floor of mouth Bolus preparation/mastication: Slow prolonged chewing/mashing with complete recollection Bolus transport/lingual motion: Slow tongue motion; Repetitive/disorganized tongue motion Oral residue: Trace residue lining oral structures Location of oral residue : Floor of mouth Initiation of pharyngeal swallow : Pyriform sinuses; Posterior laryngeal surface of the epiglottis  Pharyngeal Impairment Domain: Pharyngeal Impairment Domain Soft palate elevation: No bolus between soft palate (SP)/pharyngeal wall (PW) Laryngeal elevation: Complete superior movement of thyroid cartilage with complete approximation of arytenoids to epiglottic petiole Anterior hyoid excursion: Complete anterior movement Epiglottic movement: Complete inversion Laryngeal vestibule closure: Complete, no air/contrast in laryngeal vestibule Pharyngeal stripping wave : Present - complete Pharyngeal contraction (A/P view only): N/A Pharyngoesophageal segment opening: Complete distension and complete duration, no obstruction of flow Tongue base retraction: No contrast between tongue base and posterior pharyngeal wall (PPW) Pharyngeal residue: Complete pharyngeal clearance Location of pharyngeal residue: N/A  Esophageal Impairment Domain: Esophageal Impairment Domain Esophageal clearance upright position: Esophageal retention Pill: Pill Consistency administered: Thin  liquids (Level 0); Puree Thin liquids (Level 0): Impaired (see clinical impressions) Puree: WFL Penetration/Aspiration Scale Score: Penetration/Aspiration Scale Score 1.  Material does not enter airway: Puree; Solid; Pill 2.  Material enters airway, remains ABOVE vocal cords then ejected out: Thin liquids (Level 0) Compensatory Strategies: Compensatory Strategies Compensatory strategies: No   General Information: No data recorded Diet Prior to this Study: Mildly thick liquids (Level 2, nectar thick); Dysphagia 2 (finely chopped)   No data recorded  Respiratory Status: WFL   Supplemental O2: None (Room air)   No data recorded Behavior/Cognition: Alert; Cooperative Self-Feeding Abilities: Able to self-feed No data recorded Volitional Cough: Able to elicit Volitional Swallow: Able to elicit Exam Limitations: No limitations Goal Planning: Prognosis for improved oropharyngeal function: Good Barriers to Reach Goals: Cognitive deficits No data recorded No data recorded Consulted and agree with results and recommendations: Patient Pain: No data recorded End of Session: Start Time:No data recorded Stop Time: No data recorded Time Calculation:No data recorded Charges: No data recorded SLP visit diagnosis: SLP Visit Diagnosis: Dysphagia, oral phase (R13.11) Past Medical History: Past Medical History: Diagnosis Date  GERD (gastroesophageal reflux disease)   Hypothyroidism   Seizures (HCC)  Past Surgical History: Past Surgical History: Procedure Laterality Date  AORTA - BILATERAL FEMORAL ARTERY BYPASS GRAFT Bilateral 01/07/2017  Procedure: AORTOBIFEMORAL BYPASS GRAFT;  Surgeon: Larina Earthly, MD;  Location: [facility] OR;  Service: Vascular;  Laterality: Bilateral;  I & D EXTREMITY Left 11/28/2014  Procedure: IRRIGATION AND DEBRIDEMENT;  REPAIR OF CRUSH INJURY;  Surgeon: Betha Loa, MD;  Location: [facility] OR;  Service: Orthopedics;  Laterality: Left;  INCISIONAL HERNIA REPAIR N/A 10/26/2017  Procedure: HERNIA REPAIR INCISIONAL WITH MESH;   Surgeon: Franky Macho, MD;  Location: [facility];  Service: General;  Laterality: N/A;  IR CT HEAD LTD  11/30/2023  IR INTRAVSC STENT CERV CAROTID W/EMB-PROT MOD SED INCL ANGIO  11/30/2023  IR PERCUTANEOUS ART THROMBECTOMY/INFUSION INTRACRANIAL INC DIAG ANGIO  11/30/2023  IR US GUIDE VASC ACCESS RIGHT  11/30/2023  RADIOLOGY WITH ANESTHESIA N/A 11/30/2023  Procedure: IR WITH ANESTHESIA;  Surgeon: Radiologist, Medication, MD;  Location: [facility] OR;  Service: Radiology;  Laterality: N/A;  Cassidi F Sockwell 12/29/2023, 9:57 AM   Recent Labs    12/28/23 0532  WBC 5.5  HGB 14.7  HCT 41.0  PLT 196     Recent Labs    12/28/23 0532  NA 133*  K 4.1  CL 103  CO2 22  GLUCOSE 97  BUN 13  CREATININE 1.22  CALCIUM 8.5*           Intake/Output Summary (Last 24 hours) at 12/30/2023 0811 Last data filed at 12/30/2023 0800 Gross per 24 hour  Intake 677 ml  Output --  Net 677 ml        Physical Exam: Vital Signs Blood pressure 113/67, pulse 74, temperature 97.8 F (36.6 C), resp. rate 18, height 5\' 10"  (1.778 m), weight 96.6 kg, SpO2 97%.  General: No acute distress, resting in bed watching TV Mood and affect are appropriate Heart: Regular rate and rhythm no rubs murmurs or extra sounds Lungs: Clear to auscultation, breathing unlabored, no rales or wheezes, no rales heard  Abdomen: Positive bowel sounds, soft nontender to palpation, nondistended Extremities: No clubbing, cyanosis, or edema Skin: No evidence of breakdown, no evidence of rash over exposed surfaces  PRIOR EXAMS: Neurologic: Cranial nerves II through XII intact, motor strength is 5/5 in right  deltoid, bicep, tricep, grip, hip flexor, knee extensors, ankle dorsiflexor and plantar flexor 3 to 3+ LUE, 4- to 4/5 LLE  Musculoskeletal: Musculoskeletal: Full range of motion in all 4 extremities. No joint swelling. Reduced active left shoulder abduction    Assessment/Plan: 1. Functional deficits which require 3+ hours per day of  interdisciplinary therapy in a comprehensive inpatient rehab setting. Physiatrist is providing close team supervision and 24 hour management of active medical problems listed below. Physiatrist and rehab team continue to assess barriers to discharge/monitor patient progress toward functional and medical goals  Care Tool:  Bathing    Body parts bathed by patient: Chest, Abdomen, Buttocks, Front perineal area, Right upper leg, Left upper leg, Face, Left arm   Body parts bathed by helper: Right arm, Right lower leg, Left lower leg     Bathing assist Assist Level: Moderate Assistance - Patient 50 - 74%     Upper Body Dressing/Undressing Upper body dressing   What is the patient wearing?: Pull over shirt    Upper body assist Assist Level: Moderate Assistance - Patient 50 - 74%    Lower Body Dressing/Undressing Lower body dressing      What is the patient wearing?: Incontinence brief, Pants     Lower body assist Assist for lower body dressing: Moderate Assistance - Patient 50 - 74%     Toileting Toileting    Toileting assist Assist for toileting: Supervision/Verbal cueing     Transfers Chair/bed transfer  Transfers assist     Chair/bed transfer assist level: Contact Guard/Touching assist     Locomotion Ambulation   Ambulation assist      Assist level: Contact Guard/Touching assist Assistive device: No Device Max distance: 150'   Walk 10 feet activity   Assist     Assist level: Contact Guard/Touching assist Assistive device: No Device   Walk 50 feet activity   Assist    Assist level: Contact Guard/Touching assist Assistive device: No Device    Walk 150 feet activity   Assist    Assist level: Minimal Assistance - Patient > 75% Assistive device: No Device    Walk 10 feet on uneven surface  activity   Assist Walk 10 feet on uneven  surfaces activity did not occur: Safety/medical concerns         Wheelchair     Assist Is the  patient using a wheelchair?: Yes Type of Wheelchair: Manual    Wheelchair assist level: Dependent - Patient 0%      Wheelchair 50 feet with 2 turns activity    Assist        Assist Level: Dependent - Patient 0%   Wheelchair 150 feet activity     Assist      Assist Level: Dependent - Patient 0%   Blood pressure 113/67, pulse 74, temperature 97.8 F (36.6 C), resp. rate 18, height 5\' 10"  (1.778 m), weight 96.6 kg, SpO2 97%.  Medical Problem List and Plan: 1. 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    2.  Antithrombotics: -DVT/anticoagulation:  Pharmaceutical: Lovenox 40mg  daily             -antiplatelet therapy: Aspirin 81 mg daily and Brilinta 90 mg twice daily 3. Pain Management: Tylenol as needed 4. Mood/Behavior/Sleep: Provide emotional support  - Told me he regrets not taking care of himself earlier in his life -1/17. Begin lexapro 5mg  qhs             -neuropsych eval when provider is available             -antipsychotic agents: N/A 5. Neuropsych/cognition: This patient is capable of making decisions on his own behalf. 6. Skin/Wound Care: Routine skin checks 7. Fluids/Electrolytes/Nutrition: Routine in and outs with follow-up chemistries on Monday   -BMET improved 1/20---push fluids, continue H2O flushes thru tube    Latest Ref Rng & Units 12/28/2023    5:32 AM 12/21/2023    5:38 AM 12/18/2023    7:37 AM  BMP  Glucose 70 - 99 mg/dL 97  161  096   BUN 8 - 23 mg/dL 13  23  26    Creatinine 0.61 - 1.24 mg/dL 0.45  4.09  8.11   Sodium 135 - 145 mmol/L 133  133  134   Potassium 3.5 - 5.1 mmol/L 4.1  4.2  4.3   Chloride 98 - 111 mmol/L 103  99  99   CO2 22 - 32 mmol/L 22  24  26    Calcium  8.9 - 10.3 mg/dL 8.5  8.7  8.8    8.  Dysphagia.  Dysphagia #1 nectar thick liquids.   -SLP rec repeat D2 trial -dc cbg  CBG (last 3)  Recent Labs    12/29/23 1638 12/29/23 2058 12/30/23 0709  GLUCAP 112* 90 111*   9.  Hyperlipidemia.  Lipitor 20mg  daily 10.  GERD.  Nexium 20mg  daily 11.  Hypothyroidism.  Synthroid daily 12.  COPD exacerbation/respiratory distress.  Patient completed 5-day course of Unasyn.  Prednisone taper completed.   -pt on room air currently. 13. HTN?: unclear if this is a new diagnosis? No mention in notes. BPs elevated overnight. Monitor for now -1/29controlled off meds  Vitals:   12/26/23 1425 12/26/23 1948 12/27/23 0437 12/27/23 1512  BP: (!) 154/76 123/69 134/74 133/68   12/27/23 1953  12/28/23 0429 12/28/23 1607 12/28/23 2049  BP: 123/86 (!) 115/59 122/68 (!) 128/51   12/29/23 0527 12/29/23 1450 12/29/23 2026 12/30/23 0518  BP: (!) 110/53 104/64 135/68 113/67    14. Bowel regimen:    -last bm 1/16 , type 4. Continue colace  -12/27/23 LBM this morning, cont regimen  LOS: 19 days A FACE TO FACE EVALUATION WAS PERFORMED  Erick Colace 12/30/2023, 8:11 AM

## 2023-12-30 NOTE — Plan of Care (Signed)
  Problem: RH Balance Goal: LTG: Patient will maintain dynamic sitting balance (OT) Description: LTG:  Patient will maintain dynamic sitting balance with assistance during activities of daily living (OT) Outcome: Completed/Met   Problem: RH Balance Goal: LTG Patient will maintain dynamic standing with ADLs (OT) Description: LTG:  Patient will maintain dynamic standing balance with assist during activities of daily living (OT)  Outcome: Completed/Met   Problem: Sit to Stand Goal: LTG:  Patient will perform sit to stand in prep for activites of daily living with assistance level (OT) Description: LTG:  Patient will perform sit to stand in prep for activites of daily living with assistance level (OT) Outcome: Completed/Met   Problem: RH Eating Goal: LTG Patient will perform eating w/assist, cues/equip (OT) Description: LTG: Patient will perform eating with assist, with/without cues using equipment (OT) Outcome: Adequate for Discharge Flowsheets (Taken 12/30/2023 1234) LTG: Pt will perform eating with assistance level of: (Supervision needed for swallowing precautions only) --   Problem: RH Grooming Goal: LTG Patient will perform grooming w/assist,cues/equip (OT) Description: LTG: Patient will perform grooming with assist, with/without cues using equipment (OT) Outcome: Completed/Met   Problem: RH Bathing Goal: LTG Patient will bathe all body parts with assist levels (OT) Description: LTG: Patient will bathe all body parts with assist levels (OT) Outcome: Completed/Met   Problem: RH Dressing Goal: LTG Patient will perform upper body dressing (OT) Description: LTG Patient will perform upper body dressing with assist, with/without cues (OT). Outcome: Completed/Met   Problem: RH Dressing Goal: LTG Patient will perform lower body dressing w/assist (OT) Description: LTG: Patient will perform lower body dressing with assist, with/without cues in positioning using equipment (OT) Outcome:  Completed/Met   Problem: RH Toileting Goal: LTG Patient will perform toileting task (3/3 steps) with assistance level (OT) Description: LTG: Patient will perform toileting task (3/3 steps) with assistance level (OT)  Outcome: Completed/Met   Problem: RH Functional Use of Upper Extremity Goal: LTG Patient will use RT/LT upper extremity as a (OT) Description: LTG: Patient will use right/left upper extremity as a stabilizer/gross assist/diminished/nondominant/dominant level with assist, with/without cues during functional activity (OT) Outcome: Adequate for Discharge Note: Pt continues to need cues to engage using his L hand and arm.    Problem: RH Toilet Transfers Goal: LTG Patient will perform toilet transfers w/assist (OT) Description: LTG: Patient will perform toilet transfers with assist, with/without cues using equipment (OT) Outcome: Completed/Met   Problem: RH Tub/Shower Transfers Goal: LTG Patient will perform tub/shower transfers w/assist (OT) Description: LTG: Patient will perform tub/shower transfers with assist, with/without cues using equipment (OT) Outcome: Completed/Met   Problem: RH Memory Goal: LTG Patient will demonstrate ability for day to day recall/carry over during activities of daily living with assistance level (OT) Description: LTG:  Patient will demonstrate ability for day to day recall/carry over during activities of daily living with assistance level (OT). Outcome: Completed/Met   Problem: RH Awareness Goal: LTG: Patient will demonstrate awareness during functional activites type of (OT) Description: LTG: Patient will demonstrate awareness during functional activites type of (OT) Outcome: Completed/Met

## 2023-12-30 NOTE — Progress Notes (Addendum)
Speech Language Pathology Discharge Summary  Patient Details  Name: Ian Wong MRN: [id number] Date of Birth: Mar 29, 1961  Date of Discharge from SLP service:December 30, 2023  Today's Date: 12/30/2023 SLP Individual Time: 1101-1200 SLP Individual Time Calculation (min): 59 min   Skilled Therapeutic Interventions:   Skilled therapy session focused on re-evaluation of dysphagia and cognitive-linguistic goals. SLP presented patient with D3 textures for trial. Patient with mildly prolonged mastication times, however adequate oral clearance given mod i A. Patient with x1 immediate cough after thin liquids, however SLP encouraged single, small sips. No other s/sx of aspiraiton observed. SLP recommends continuation of current diet at this time with continued D3 trials with OP ST. SLP re-evaluated cognitive linguistic skills via completion of the Cognistat. Patient scored WFL on all subtests with the exception of moderate difficulty during visual spatial tasks indicating continued mildly complex problem solving deficits. Despite this, observed substantial improvements since evaluation in the areas of memory, attention and dysphagia. Patient left in bed with alarm set and call bell in reach.     Patient has met 5 of 6 long term goals.  Patient to discharge at overall Modified Independent;Supervision;Min level.  Reasons goals not met: continues to benefit from minA for mildly complex problem solving   Clinical Impression/Discharge Summary:  Pt has made good gains and has met 4 of 5 LTG's this admission due to improved dysphagia, communication and cognition. Pt is currently an overall supervisionA for cognitive tasks with the exception of minA for problem solving. Patient requires mod i cues for utilization of swallowing compensatory strategies to minimize overt s/sx of aspiration with D2/thin liquid diet. Patient is now 100% intelligible at the conversational level independently. Pt/family education  complete and pt will discharge home with 24 hour supervision from friends/family/etc. Pt would benefit from f/u ST services to maximize dysphagia and cognition in order to maximize functional independence.   Care Partner:  Caregiver Able to Provide Assistance: Yes  Type of Caregiver Assistance: Physical;Cognitive  Recommendation:  Home Health SLP;Outpatient SLP  Rationale for SLP Follow Up: Maximize swallowing safety;Maximize cognitive function and independence   Equipment: n/a   Reasons for discharge: Discharged from hospital   Patient/Family Agrees with Progress Made and Goals Achieved: Yes    Aamir Mclinden M.A., CF-SLP 12/30/2023, 12:10 PM

## 2023-12-30 NOTE — Plan of Care (Signed)
  Problem: RH Problem Solving Goal: LTG Patient will demonstrate problem solving for (SLP) Description: LTG:  Patient will demonstrate problem solving for basic/complex daily situations with cues  (SLP) Outcome: Not Met (continues to require minA)   Problem: RH Swallowing Goal: LTG Patient will consume least restrictive diet using compensatory strategies with assistance (SLP) Description: LTG:  Patient will consume least restrictive diet using compensatory strategies with assistance (SLP) Outcome: Completed/Met   Problem: RH Expression Communication Goal: LTG Patient will increase speech intelligibility (SLP) Description: LTG: Patient will increase speech intelligibility at word/phrase/conversation level with cues, % of the time (SLP) Outcome: Completed/Met   Problem: RH Memory Goal: LTG Patient will use memory compensatory aids to (SLP) Description: LTG:  Patient will use memory compensatory aids to recall biographical/new, daily complex information with cues (SLP) Outcome: Completed/Met   Problem: RH Attention Goal: LTG Patient will demonstrate this level of attention during functional activites (SLP) Description: LTG:  Patient will will demonstrate this level of attention during functional activites (SLP) Outcome: Completed/Met   Problem: RH Awareness Goal: LTG: Patient will demonstrate awareness during functional activites type of (SLP) Description: LTG: Patient will demonstrate awareness during functional activites type of (SLP) Outcome: Completed/Met

## 2023-12-30 NOTE — Patient Care Conference (Signed)
Inpatient RehabilitationTeam Conference and Plan of Care Update Date: 12/30/2023   Time: 10:13 AM    Patient Name: Ian Wong      Medical Record Number: [id number]  Date of Birth: 1961-11-29 Sex: Male         Room/Bed: 4M12C/4M12C-01 Payor Info: Payor: [place] [employer]AN / Plan: [place] [employer] / Product Type: *No Product type* /    Admit Date/Time:  12/11/2023  2:45 PM  Primary Diagnosis:  Right middle cerebral artery stroke [facility])  Hospital Problems: Principal Problem:   Right middle cerebral artery stroke (HCC) Active Problems:   Dysphagia, post-stroke   Spastic hemiparesis 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 [facility])    Expected Discharge Date: Expected Discharge Date: 12/31/23  Team Members Present: Physician leading conference: Dr. Claudette Laws Social Worker Present: Cecile Sheerer, LCSWA Nurse Present: Chana Bode, RN;Angelina Shirlee Latch, RN;Jeania Nater Marjo Bicker, RN PT Present: Ralph Leyden, PT OT Present: Primitivo Gauze, OT SLP Present: Everardo Pacific, SLP PPS Coordinator present : Fae Pippin, SLP     Current Status/Progress Goal Weekly Team Focus  Bowel/Bladder   Pt is continent of bowel/bladder  *** Pt will remain continent of bowel/bladder   Will assess qshift and PRN    Swallow/Nutrition/ Hydration   upgraded to D2/thin  *** Mod I  upgrade solids before d/c    ADL's   pt has met supervision goals for ADLS, improved functional use of LUE and awareness of L side  *** supervision   ADL training, LUE NMR, pt education    Mobility   Bed mobility = Mod I, sit<>stand = supervision, stand pivot = close supervision, ambulation = no AD with close supervision/ CGA and continues to demo L lean/ LOB with fatigue  *** overall supervision  Barriers: improving but continued L inattn, reduced L hemibody awareness UE>LE /// Work on: L hemibody NMR, standing balance, L sided strengthening, reducing LOA in  mobility, floor transfers    Communication   90% intelligible at the conversational level  *** mod I   implement and carryover of intelligibility strategies    Safety/Cognition/ Behavioral Observations  supervision-minA  *** supervision   memory strategies, attention to L, mildly complex problem solving    Pain   Pt currently denies pain  *** Pt will continue to deny pain   Will assess qshift and PRN    Skin   Pt's skin is intact  *** Pt's skin will remain intact  Will assess qshift and PRN      Discharge Planning:  Pt will d/c to home with his wife who will be his primary caregiver. pt needs to be Mod I to supervision as his wife cares for their disabled son. PCS referral submitted to insurance. Fam edu on Tuesday (1/28) 1pm-4pm with his wife. [facility]HA- [facility] for HHPT/OT/SLP/aide. Shower chair with back ordered with [employer].  SW will confirm there are no barriers to discharge.   Team Discussion: Post right MCA/CVA. Addressing left shoulder wellness. Responding to Lexapro. PO intake continues to improve. Family brings in food/liquids outside of recommended diet.  Patient on target to meet rehab goals: yes, meeting goals with discharge date of 12/31/2023  *See Care Plan and progress notes for long and short-term goals.   Revisions to Treatment Plan:  D2/thin liquid diet.   Teaching Needs: Medications, safety, self care, diet modifications, transfers, etc.   Current Barriers to Discharge: {BARRIERS TO WUJWJXBJY:78295}  Possible Resolutions to Barriers: ***  Medical Summary Current Status: left neglect and processing issues post CVA, diet upgraded , Left shoulder wellness  Barriers to Discharge: Other (comments)  Barriers to Discharge Comments: left neglect Possible Resolutions to Barriers/Weekly Focus: po intake is good, diet upgraded, cont cuing to left side, arrange OP therapy and MD f/u , d/c in am   Continued Need for Acute Rehabilitation Level of  Care: The patient requires daily medical management by a physician with specialized training in physical medicine and rehabilitation for the following reasons: Direction of a multidisciplinary physical rehabilitation program to maximize functional independence : Yes Medical management of patient stability for increased activity during participation in an intensive rehabilitation regime.: Yes Analysis of laboratory values and/or radiology reports with any subsequent need for medication adjustment and/or medical intervention. : Yes   I attest that I was present, lead the team conference, and concur with the assessment and plan of the team.   Jearld Adjutant 12/30/2023, 4:12 PM

## 2023-12-30 NOTE — Progress Notes (Signed)
Physical Therapy Discharge Summary  Patient Details  Name: Ian Wong MRN: [id number] Date of Birth: 1961/08/06  Date of Discharge from PT service:December 30, 2023  Patient has met {NUMBERS 0-12:18577} of {NUMBERS 0-12:18577} long term goals due to {due HQ:4696295}.  Patient to discharge at [facility] level {LOA:3049010}.   Patient's care partner {care partner:3041650} to provide the necessary {assistance:3041652} assistance at discharge.  Reasons goals not met: ***  Recommendation:  Patient will benefit from ongoing skilled PT services in {setting:3041680} to continue to advance safe functional mobility, address ongoing impairments in ***, and minimize fall risk.  Equipment: {equipment:3041657}  Reasons for discharge: {Reason for discharge:3049018}  Patient/family agrees with progress made and goals achieved: {Pt/Family agree with progress/goals:3049020}  PT Discharge Precautions/Restrictions Precautions Precautions: Fall Precaution Comments: L inattention Restrictions Weight Bearing Restrictions Per Provider Order: No Vital Signs Therapy Vitals Temp: 97.8 F (36.6 C) Pulse Rate: 74 Resp: 18 BP: 113/67 Patient Position (if appropriate): Lying Oxygen Therapy SpO2: 97 % O2 Device: Room Air Pain Pain Assessment Pain Scale: 0-10 Pain Score: 0-No pain Pain Interference Pain Interference Pain Effect on Sleep: 1. Rarely or not at all Pain Interference with Therapy Activities: 1. Rarely or not at all Pain Interference with Day-to-Day Activities: 1. Rarely or not at all Vision/Perception     Cognition Overall Cognitive Status: Impaired/Different from baseline Arousal/Alertness: Awake/alert Orientation Level: Oriented X4 Sensation Sensation Light Touch: Impaired by gross assessment Hot/Cold: Impaired by gross assessment Proprioception: Impaired by gross assessment Coordination Gross Motor Movements are Fluid and Coordinated: No Fine Motor Movements are  Fluid and Coordinated: No Motor  Motor Motor: Other (comment) (Hemiparesis) Motor - Discharge Observations: Hemiparesis L UE/LE  Mobility Bed Mobility Bed Mobility: Rolling Right;Sitting - Scoot to Edge of Bed;Sit to Supine;Supine to Sit Rolling Right: Independent Supine to Sit: Independent Sitting - Scoot to Edge of Bed: Independent Sit to Supine: Independent Transfers Transfers: Stand to Sit;Sit to Stand;Stand Pivot Transfers Sit to Stand: Supervision/Verbal cueing Stand to Sit: Supervision/Verbal cueing Stand Pivot Transfers: Supervision/Verbal cueing Stand Pivot Transfer Details: Verbal cues for technique;Verbal cues for precautions/safety;Verbal cues for sequencing Stand Pivot Transfer Details (indicate cue type and reason): Increasing awareness to L side Transfer (Assistive device): None Locomotion  Gait Ambulation: Yes Gait Assistance: Supervision/Verbal cueing Gait Distance (Feet): 300 Feet Assistive device: None Gait Assistance Details: Verbal cues for technique;Verbal cues for gait pattern;Verbal cues for precautions/safety;Verbal cues for sequencing Gait Assistance Details: Pt with decreased safety awareness to L side Gait Gait: Yes Gait Pattern: Impaired Gait Pattern: Step-through pattern;Poor foot clearance - left;Decreased weight shift to right Stairs / Additional Locomotion Stairs: Yes Stairs Assistance: Supervision/Verbal cueing Stair Management Technique: One rail Right Number of Stairs: 12 Height of Stairs: 7 Ramp: Supervision/Verbal cueing Wheelchair Mobility Wheelchair Mobility: No  Trunk/Postural Assessment  Cervical Assessment Cervical Assessment: Exceptions to [facility] (R cervical rotation 2/2 L innatention (can correct with VC, but unable to maintain)) Thoracic Assessment Thoracic Assessment: Within Functional Limits Lumbar Assessment Lumbar Assessment: Within Functional Limits Postural Control Postural Control: Deficits on evaluation Righting  Reactions: delayed and inadequate Protective Responses: decreased  Balance Berg Balance Test Sit to Stand: Able to stand without using hands and stabilize independently Standing Unsupported: Able to stand 2 minutes with supervision Sitting with Back Unsupported but Feet Supported on Floor or Stool: Able to sit safely and securely 2 minutes Stand to Sit: Sits safely with minimal use of hands Transfers: Able to transfer with verbal cueing and /or supervision Standing Unsupported with Eyes Closed:  Able to stand 10 seconds with supervision Standing Ubsupported with Feet Together: Able to place feet together independently and stand for 1 minute with supervision From Standing, Reach Forward with Outstretched Arm: Can reach confidently >25 cm (10") From Standing Position, Pick up Object from Floor: Able to pick up shoe, needs supervision From Standing Position, Turn to Look Behind Over each Shoulder: Looks behind one side only/other side shows less weight shift Turn 360 Degrees: Able to turn 360 degrees safely in 4 seconds or less Standing Unsupported, Alternately Place Feet on Step/Stool: Able to complete >2 steps/needs minimal assist Standing Unsupported, One Foot in Front: Able to plae foot ahead of the other independently and hold 30 seconds Standing on One Leg: Able to lift leg independently and hold equal to or more than 3 seconds Total Score: 43 Static Sitting Balance Static Sitting - Balance Support: Feet unsupported;No upper extremity supported Static Sitting - Level of Assistance: 7: Independent Dynamic Sitting Balance Dynamic Sitting - Balance Support: No upper extremity supported;Feet unsupported Dynamic Sitting - Level of Assistance: 7: Independent Dynamic Sitting - Balance Activities: Reaching across midline Sitting balance - Comments: Pt able to find midline (slight lean to the L) and able to correct with VC and no LOB Static Standing Balance Static Standing - Balance Support: No  upper extremity supported Static Standing - Level of Assistance: 5: Stand by assistance Dynamic Standing Balance Dynamic Standing - Balance Support: During functional activity;No upper extremity supported Dynamic Standing - Level of Assistance: 5: Stand by assistance Dynamic Standing - Balance Activities: Reaching across midline;Lateral lean/weight shifting Extremity Assessment        LLE Assessment LLE Assessment: Exceptions to [facility] LLE Strength Left Hip Flexion: 3+/5 Left Knee Flexion: 4+/5 Left Knee Extension: 3+/5 Left Ankle Dorsiflexion: 4/5 Left Ankle Plantar Flexion: 3/5   Farley Crooker PTA   12/30/2023, 9:18 AM

## 2023-12-31 NOTE — Plan of Care (Signed)
Pt improving pain management. No c/o of pain.

## 2023-12-31 NOTE — Progress Notes (Signed)
PROGRESS NOTE   Subjective/Complaints:  DIscussed no driving mainly due to Left neglect   ROS: Limited due to cognitive/behavioral, but denied CP, SOB, abd pain, n/v/d/c  Objective:   DG Swallowing Func-Speech Pathology Result Date: 12/29/2023 Table formatting from the original result was not included. Modified Barium Swallow Study Patient Details Name: Ian Wong MRN: [id number] Date of Birth: 1961/10/03 Today's Date: 12/29/2023 HPI/PMH: HPI: 63 yo male transfer from [facility] with L side weakness and R gaze deviation s/p TNK and thrombectomy 12/30. There was some concern for aspiration after vomiting episode during intubation so was kept on vent until 12/31. 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 Repeate CT 1/1 due to decreased wakefulness showed increasing mass effect on the R lateral ventricle.  PMH includes: GERD, hypothyroidism, seizures. MBS completed on 12/08/23 recommending nectar thick liquids. Repeat MBS to determine readiness to advance. Clinical Impression: Patient presents with mild oral dysphagia with a functional pharyngeal phase. Oral phase is characterized by reduced lingual control/coordination and prolonged bolus preparation resulting in occasional posterior spillage to the pyriform sinuses, prolonged mastication and trace-mild oral residuals. Oral residuals cleared through spontaneous additional swallow. Patient presents with largely functional pharyngeal phase remarkable for complete epiglottic inversion, laryngeal vestibule closure and adequate hyoid excursion.  There was x1 event of transient penetration during thin liquids, though no other events of penetration/aspiration present across consistencies. Suspected esophageal retention present during pill administration, though cleared through additional boluses of puree. Recommend continuation of dysphagia 2 solids with upgrade to thin liquids. SLP will continue  to follow at bedside to trial upgraded solids. Medications should be given whole in puree. Continue to utilize standardized swallowing precautions including sitting upright at 90 degrees, slow rate, small bites/sips, along with checking for oral residuals.   Factors that may increase risk of adverse event in presence of aspiration Rubye Oaks & Clearance Coots 2021): Factors that may increase risk of adverse event in presence of aspiration Rubye Oaks & Clearance Coots 2021): Frail or deconditioned; Reduced cognitive function Recommendations/Plan: Swallowing Evaluation Recommendations Swallowing Evaluation Recommendations Recommendations: PO diet PO Diet Recommendation: Dysphagia 2 (Finely chopped); Thin liquids (Level 0) Liquid Administration via: Cup; Spoon; Straw Medication Administration: Whole meds with puree Supervision: Staff to assist with self-feeding; Intermittent supervision/cueing for swallowing strategies Swallowing strategies  : Minimize environmental distractions; Slow rate; Small bites/sips; Check for pocketing or oral holding Postural changes: Position pt fully upright for meals; Stay upright 30-60 min after meals Oral care recommendations: Oral care BID (2x/day) Treatment Plan Treatment Plan Treatment recommendations: Therapy as outlined in treatment plan below Follow-up recommendations: Outpatient SLP Recommendations Comment: n/a Functional status assessment: Patient has had a recent decline in their functional status and demonstrates the ability to make significant improvements in function in a reasonable and predictable amount of time. Treatment frequency: Min 2x/week Treatment duration: 2 weeks Interventions: Aspiration precaution training; Compensatory techniques; Patient/family education; Trials of upgraded texture/liquids; Diet toleration management by SLP; Respiratory muscle strength training Recommendations Recommendations for follow up therapy are one component of a multi-disciplinary discharge planning process,  led by the attending physician.  Recommendations may be updated based on patient status, additional functional criteria and insurance authorization. Assessment: Orofacial Exam: Orofacial  Exam Oral Cavity: Oral Hygiene: WFL Oral Cavity - Dentition: Missing dentition Orofacial Anatomy: WFL Anatomy: No data recorded Boluses Administered: Boluses Administered Boluses Administered: Thin liquids (Level 0); Puree; Solid  Oral Impairment Domain: Oral Impairment Domain Lip Closure: No labial escape Tongue control during bolus hold: Escape to lateral buccal cavity/floor of mouth Bolus preparation/mastication: Slow prolonged chewing/mashing with complete recollection Bolus transport/lingual motion: Slow tongue motion; Repetitive/disorganized tongue motion Oral residue: Trace residue lining oral structures Location of oral residue : Floor of mouth Initiation of pharyngeal swallow : Pyriform sinuses; Posterior laryngeal surface of the epiglottis  Pharyngeal Impairment Domain: Pharyngeal Impairment Domain Soft palate elevation: No bolus between soft palate (SP)/pharyngeal wall (PW) Laryngeal elevation: Complete superior movement of thyroid cartilage with complete approximation of arytenoids to epiglottic petiole Anterior hyoid excursion: Complete anterior movement Epiglottic movement: Complete inversion Laryngeal vestibule closure: Complete, no air/contrast in laryngeal vestibule Pharyngeal stripping wave : Present - complete Pharyngeal contraction (A/P view only): N/A Pharyngoesophageal segment opening: Complete distension and complete duration, no obstruction of flow Tongue base retraction: No contrast between tongue base and posterior pharyngeal wall (PPW) Pharyngeal residue: Complete pharyngeal clearance Location of pharyngeal residue: N/A  Esophageal Impairment Domain: Esophageal Impairment Domain Esophageal clearance upright position: Esophageal retention Pill: Pill Consistency administered: Thin liquids (Level 0); Puree  Thin liquids (Level 0): Impaired (see clinical impressions) Puree: WFL Penetration/Aspiration Scale Score: Penetration/Aspiration Scale Score 1.  Material does not enter airway: Puree; Solid; Pill 2.  Material enters airway, remains ABOVE vocal cords then ejected out: Thin liquids (Level 0) Compensatory Strategies: Compensatory Strategies Compensatory strategies: No   General Information: No data recorded Diet Prior to this Study: Mildly thick liquids (Level 2, nectar thick); Dysphagia 2 (finely chopped)   No data recorded  Respiratory Status: WFL   Supplemental O2: None (Room air)   No data recorded Behavior/Cognition: Alert; Cooperative Self-Feeding Abilities: Able to self-feed No data recorded Volitional Cough: Able to elicit Volitional Swallow: Able to elicit Exam Limitations: No limitations Goal Planning: Prognosis for improved oropharyngeal function: Good Barriers to Reach Goals: Cognitive deficits No data recorded No data recorded Consulted and agree with results and recommendations: Patient Pain: No data recorded End of Session: Start Time:No data recorded Stop Time: No data recorded Time Calculation:No data recorded Charges: No data recorded SLP visit diagnosis: SLP Visit Diagnosis: Dysphagia, oral phase (R13.11) Past Medical History: Past Medical History: Diagnosis Date  GERD (gastroesophageal reflux disease)   Hypothyroidism   Seizures (HCC)  Past Surgical History: Past Surgical History: Procedure Laterality Date  AORTA - BILATERAL FEMORAL ARTERY BYPASS GRAFT Bilateral 01/07/2017  Procedure: AORTOBIFEMORAL BYPASS GRAFT;  Surgeon: Larina Earthly, MD;  Location: [facility] OR;  Service: Vascular;  Laterality: Bilateral;  I & D EXTREMITY Left 11/28/2014  Procedure: IRRIGATION AND DEBRIDEMENT;  REPAIR OF CRUSH INJURY;  Surgeon: Betha Loa, MD;  Location: [facility] OR;  Service: Orthopedics;  Laterality: Left;  INCISIONAL HERNIA REPAIR N/A 10/26/2017  Procedure: HERNIA REPAIR INCISIONAL WITH MESH;  Surgeon: Franky Macho, MD;   Location: [facility];  Service: General;  Laterality: N/A;  IR CT HEAD LTD  11/30/2023  IR INTRAVSC STENT CERV CAROTID W/EMB-PROT MOD SED INCL ANGIO  11/30/2023  IR PERCUTANEOUS ART THROMBECTOMY/INFUSION INTRACRANIAL INC DIAG ANGIO  11/30/2023  IR US GUIDE VASC ACCESS RIGHT  11/30/2023  RADIOLOGY WITH ANESTHESIA N/A 11/30/2023  Procedure: IR WITH ANESTHESIA;  Surgeon: Radiologist, Medication, MD;  Location: [facility] OR;  Service: Radiology;  Laterality: N/A; Cassidi F Sockwell 12/29/2023, 9:57 AM  No results for input(s): "WBC", "HGB", "HCT", "PLT" in the last 72 hours.    No results for input(s): "NA", "K", "CL", "CO2", "GLUCOSE", "BUN", "CREATININE", "CALCIUM" in the last 72 hours.          Intake/Output Summary (Last 24 hours) at 12/31/2023 0746 Last data filed at 12/30/2023 1805 Gross per 24 hour  Intake 720 ml  Output --  Net 720 ml        Physical Exam: Vital Signs Blood pressure 118/66, pulse 70, temperature 97.8 F (36.6 C), resp. rate 18, height 5\' 10"  (1.778 m), weight 96.6 kg, SpO2 100%.  General: No acute distress, resting in bed watching TV Mood and affect are appropriate Heart: Regular rate and rhythm no rubs murmurs or extra sounds Lungs: Clear to auscultation, breathing unlabored, no rales or wheezes, no rales heard  Abdomen: Positive bowel sounds, soft nontender to palpation, nondistended Extremities: No clubbing, cyanosis, or edema Skin: No evidence of breakdown, no evidence of rash over exposed surfaces  PRIOR EXAMS: Neurologic: Cranial nerves II through XII intact, motor strength is 5/5 in right  deltoid, bicep, tricep, grip, hip flexor, knee extensors, ankle dorsiflexor and plantar flexor 3 to 3+ LUE, 4- to 4/5 LLE  Musculoskeletal: Musculoskeletal: Full range of motion in all 4 extremities. No joint swelling. Reduced active left shoulder abduction    Assessment/Plan: 1. Functional deficits due to R MCA infarct Stable for D/C today F/u PCP in 3-4 weeks-  "clinic in [place]" F/u PM&R 2-3 weeks F/u Neuro No Driving  See D/C summary See D/C instructions  Care Tool:  Bathing    Body parts bathed by patient: Chest, Abdomen, Buttocks, Front perineal area, Right upper leg, Left upper leg, Face, Left arm, Right arm, Right lower leg, Left lower leg   Body parts bathed by helper: Right arm, Right lower leg, Left lower leg     Bathing assist Assist Level: Supervision/Verbal cueing     Upper Body Dressing/Undressing Upper body dressing   What is the patient wearing?: Pull over shirt    Upper body assist Assist Level: Supervision/Verbal cueing    Lower Body Dressing/Undressing Lower body dressing      What is the patient wearing?: Underwear/pull up, Pants     Lower body assist Assist for lower body dressing: Supervision/Verbal cueing     Toileting Toileting    Toileting assist Assist for toileting: Independent with assistive device     Transfers Chair/bed transfer  Transfers assist     Chair/bed transfer assist level: Supervision/Verbal cueing     Locomotion Ambulation   Ambulation assist      Assist level: Supervision/Verbal cueing Assistive device: No Device Max distance: 300 ft   Walk 10 feet activity   Assist     Assist level: Independent Assistive device: No Device   Walk 50 feet activity   Assist    Assist level: Supervision/Verbal cueing Assistive device: No Device    Walk 150 feet activity   Assist    Assist level: Supervision/Verbal cueing Assistive device: No Device    Walk 10 feet on uneven surface  activity   Assist Walk 10 feet on uneven surfaces activity did not occur: Safety/medical concerns   Assist level: Supervision/Verbal cueing Assistive device: Other (comment) (no device)   Wheelchair     Assist Is the patient using a wheelchair?: No (has refused w/c use for last 2 weeks of IPR stay) Type of Wheelchair: Manual Wheelchair activity did not occur:  Orthoptist  level: Dependent - Patient 0%      Wheelchair 50 feet with 2 turns activity    Assist    Wheelchair 50 feet with 2 turns activity did not occur: Refused   Assist Level: Dependent - Patient 0%   Wheelchair 150 feet activity     Assist  Wheelchair 150 feet activity did not occur: Refused   Assist Level: Dependent - Patient 0%   Blood pressure 118/66, pulse 70, temperature 97.8 F (36.6 C), resp. rate 18, height 5\' 10"  (1.778 m), weight 96.6 kg, SpO2 100%.  Medical Problem List and Plan: 1. Functional deficits secondary to right MCA infarct due to right ICA and MCA occlusion status post TNK NIR with TICI3 and right carotid stent             - D/C 12/31/23  2.  Antithrombotics: -DVT/anticoagulation:  Pharmaceutical: Lovenox 40mg  daily             -antiplatelet therapy: Aspirin 81 mg daily and Brilinta 90 mg twice daily 3. Pain Management: Tylenol as needed 4. Mood/Behavior/Sleep: Provide emotional support  - Told me he regrets not taking care of himself earlier in his life -1/17. Begin lexapro 5mg  qhs             -neuropsych eval when provider is available             -antipsychotic agents: N/A 5. Neuropsych/cognition: This patient is capable of making decisions on his own behalf. 6. Skin/Wound Care: Routine skin checks 7. Fluids/Electrolytes/Nutrition: Routine in and outs with follow-up chemistries on Monday   -BMET improved 1/20---push fluids, continue H2O flushes thru tube    Latest Ref Rng & Units 12/28/2023    5:32 AM 12/21/2023    5:38 AM 12/18/2023    7:37 AM  BMP  Glucose 70 - 99 mg/dL 97  578  469   BUN 8 - 23 mg/dL 13  23  26    Creatinine 0.61 - 1.24 mg/dL 6.29  5.28  4.13   Sodium 135 - 145 mmol/L 133  133  134   Potassium 3.5 - 5.1 mmol/L 4.1  4.2  4.3   Chloride 98 - 111 mmol/L 103  99  99   CO2 22 - 32 mmol/L 22  24  26    Calcium 8.9 - 10.3 mg/dL 8.5  8.7  8.8    8.  Dysphagia.  Dysphagia #1 nectar thick liquids.   -SLP  rec repeat D2 trial -dc cbg  CBG (last 3)  Recent Labs    12/30/23 1238 12/30/23 1638 12/30/23 2145  GLUCAP 104* 97 87   9.  Hyperlipidemia.  Lipitor 20mg  daily 10.  GERD.  Nexium 20mg  daily 11.  Hypothyroidism.  Synthroid daily 12.  COPD exacerbation/respiratory distress.  Patient completed 5-day course of Unasyn.  Prednisone taper completed.   -pt on room air currently. 13. HTN?: unclear if this is a new diagnosis? No mention in notes. BPs elevated overnight. Monitor for now One elevated reading (? Error) no meds goes to "clinic in [place]" Vitals:   12/27/23 1512 12/27/23 1953 12/28/23 0429 12/28/23 1607  BP: 133/68 123/86 (!) 115/59 122/68   12/28/23 2049 12/29/23 0527 12/29/23 1450 12/29/23 2026  BP: (!) 128/51 (!) 110/53 104/64 135/68   12/30/23 0518 12/30/23 1636 12/30/23 2141 12/31/23 0532  BP: 113/67 118/69 (!) 151/115 118/66    14. Bowel regimen:    -last bm 1/16 , type 4. Continue colace  -12/27/23 LBM this morning, cont  regimen  LOS: 20 days A FACE TO FACE EVALUATION WAS PERFORMED  Erick Colace 12/31/2023, 7:46 AM

## 2023-12-31 NOTE — Plan of Care (Signed)
  Problem: RH Balance Goal: LTG Patient will maintain dynamic standing balance (PT) Description: LTG:  Patient will maintain dynamic standing balance with assistance during mobility activities (PT) Outcome: Completed/Met Flowsheets (Taken 12/12/2023 1253 by Edwin Cap, PT) LTG: Pt will maintain dynamic standing balance during mobility activities with:: Supervision/Verbal cueing   Problem: Sit to Stand Goal: LTG:  Patient will perform sit to stand with assistance level (PT) Description: LTG:  Patient will perform sit to stand with assistance level (PT) Outcome: Completed/Met Flowsheets (Taken 12/31/2023 1011) LTG: PT will perform sit to stand in preparation for functional mobility with assistance level: Independent   Problem: RH Bed Mobility Goal: LTG Patient will perform bed mobility with assist (PT) Description: LTG: Patient will perform bed mobility with assistance, with/without cues (PT). Outcome: Completed/Met Flowsheets (Taken 12/12/2023 1253 by Edwin Cap, PT) LTG: Pt will perform bed mobility with assistance level of: Independent   Problem: RH Bed to Chair Transfers Goal: LTG Patient will perform bed/chair transfers w/assist (PT) Description: LTG: Patient will perform bed to chair transfers with assistance (PT). Outcome: Completed/Met Flowsheets (Taken 12/12/2023 1253 by Edwin Cap, PT) LTG: Pt will perform Bed to Chair Transfers with assistance level: Supervision/Verbal cueing   Problem: RH Car Transfers Goal: LTG Patient will perform car transfers with assist (PT) Description: LTG: Patient will perform car transfers with assistance (PT). Outcome: Completed/Met Flowsheets (Taken 12/12/2023 1253 by Edwin Cap, PT) LTG: Pt will perform car transfers with assist:: Supervision/Verbal cueing   Problem: RH Ambulation Goal: LTG Patient will ambulate in controlled environment (PT) Description: LTG: Patient will ambulate in a controlled environment, # of feet with  assistance (PT). Outcome: Completed/Met Flowsheets Taken 12/31/2023 1011 by Sharol Harness A, PT LTG: Pt will ambulate in controlled environ  assist needed:: Supervision/Verbal cueing Taken 12/12/2023 1253 by Edwin Cap, PT LTG: Ambulation distance in controlled environment: 300' Note: Close supervision/ CGA for community distances.  Goal: LTG Patient will ambulate in home environment (PT) Description: LTG: Patient will ambulate in home environment, # of feet with assistance (PT). Outcome: Completed/Met Flowsheets (Taken 12/12/2023 1253 by Edwin Cap, PT) LTG: Pt will ambulate in home environ  assist needed:: Supervision/Verbal cueing LTG: Ambulation distance in home environment: 50'   Problem: RH Stairs Goal: LTG Patient will ambulate up and down stairs w/assist (PT) Description: LTG: Patient will ambulate up and down # of stairs with assistance (PT) Outcome: Completed/Met Flowsheets (Taken 12/12/2023 1253 by Edwin Cap, PT) LTG: Pt will ambulate up/down stairs assist needed:: Supervision/Verbal cueing LTG: Pt will  ambulate up and down number of stairs: 12   Problem: RH Pre-functional/Other (Specify) Goal: RH LTG PT (Specify) 1 Description:  RH LTG PT (Specify) 1 Outcome: Completed/Met Flowsheets (Taken 12/12/2023 1253 by Edwin Cap, PT) LTG: Other PT (Specify) 1: Pt will ambulate up/down ramp with supervision and LRAD to simulate entry into home.

## 2023-12-31 NOTE — Progress Notes (Signed)
Inpatient Rehabilitation Discharge Medication Review by a Pharmacist  A complete drug regimen review was completed for this patient to identify any potential clinically significant medication issues.  High Risk Drug Classes Is patient taking? Indication by Medication  Antipsychotic No   Anticoagulant No   Antibiotic No   Opioid No   Antiplatelet Yes Aspirin and Brilinta - stroke  Hypoglycemics/insulin No   Vasoactive Medication No   Chemotherapy No   Other Yes Acetaminophen - pain Lexapro - MDD Atorvastatin - HLD Calcium and Omega 3 - supplementation Colace - constipation Esomprazole - GERD Synthroid - hypothyroidism     Type of Medication Issue Identified Description of Issue Recommendation(s)  Drug Interaction(s) (clinically significant)     Duplicate Therapy     Allergy     No Medication Administration End Date     Incorrect Dose     Additional Drug Therapy Needed     Significant med changes from prior encounter (inform family/care partners about these prior to discharge).    Other       Clinically significant medication issues were identified that warrant physician communication and completion of prescribed/recommended actions by midnight of the next day:  No  Name of provider notified for urgent issues identified:   Provider Method of Notification:     Pharmacist comments:   Time spent performing this drug regimen review (minutes):  15   Jeanella Cara, PharmD, [place] Clinical Pharmacist Please see AMION for all Pharmacists' Contact Phone Numbers 12/30/2023, 8:27 AM

## 2024-01-01 NOTE — Progress Notes (Signed)
Inpatient Rehabilitation Care Coordinator Discharge Note   Patient Details  Name: Ian Wong MRN: [id number] Date of Birth: 07/21/1961   Discharge location: D/c to home  Length of Stay: 19 days  Discharge activity level: Supervision  Home/community participation: Limited  Patient response BM:WUXLKG Literacy - How often do you need to have someone help you when you read instructions, pamphlets, or other written material from your doctor or pharmacy?: Never  Patient response MW:NUUVOZ Isolation - How often do you feel lonely or isolated from those around you?: Never  Services provided included: MD, RD, PT, OT, SLP, RN, TR, Pharmacy, Neuropsych, SW, CM  Financial Services:  Field seismologist Utilized: Media planner [place] Medicaid [employer]  Choices offered to/list presented to: patient and pt wife  Follow-up services arranged:  Home Health, DME Home Health Agency: [facility]  for HHPT/OT/SLP/aide    DME : [employer] for shower chair with back    Patient response to transportation need: Is the patient able to respond to transportation needs?: Yes In the past 12 months, has lack of transportation kept you from medical appointments or from getting medications?: No In the past 12 months, has lack of transportation kept you from meetings, work, or from getting things needed for daily living?: No   Patient/Family verbalized understanding of follow-up arrangements:  Yes  Individual responsible for coordination of the follow-up plan: contact pt or pt  wife  Confirmed correct DME delivered: Gretchen Short 01/01/2024    Comments (or additional information):family edu completed  Summary of Stay    Date/Time Discharge Planning CSW  12/28/23 1036 Pt will d/c to home with his wife who will be his primary caregiver. pt needs to be Mod I to supervision as his wife cares for their disabled son. PCS referral submitted to insurance. Fam edu on Tuesday (1/28) 1pm-4pm  with his wife. HHA- [facility] for HHPT/OT/SLP/aide. Shower chair with back ordered with [employer].  SW will confirm there are no barriers to discharge. AAC  12/21/23 1300 Pt will d/c to home with his wife who will be his primary caregiver. pt needs to be Mod I to supervision as his wife cares for their disabled son. PCS referral submitted to insurance.  SW will confirm there are no barriers to discharge. AAC  12/14/23 1505 Pt will d/c to home with his wife who will be his primary caregiver. pt needs to be Mod I to supervision as his wife cares for their disabled son. SW will submit PCS referral to his insurance. SW will confirm there are no barriers to discharge. AAC       Ian Wong Ian Wong Ian Wong

## 2024-01-04 ENCOUNTER — Telehealth: Payer: Self-pay

## 2024-01-04 NOTE — Telephone Encounter (Signed)
Transitional Care call--who you talked with    Are you/is patient experiencing any problems since coming home? Are there any questions regarding any aspect of care? No problems at this time  Are there any questions regarding medications administration/dosing? Are meds being taken as prescribed? Patient should review meds with caller to confirm No questions at this time Have there been any falls? No Has Home Health been to the house and/or have they contacted you? If not, have you tried to contact them? Can we help you contact them?  HH has contacted patient, patient decided not to move forward with aid at this time but will move forward with PT,OT, Speech Are bowels and bladder emptying properly? Are there any unexpected incontinence issues? If applicable, is patient following bowel/bladder programs? Yes Any fevers, problems with breathing, unexpected pain? No Are there any skin problems or new areas of breakdown? No Has the patient/family member arranged specialty MD follow up (ie cardiology/neurology/renal/surgical/etc)?  Can we help arrange? No Does the patient need any other services or support that we can help arrange? No Are caregivers following through as expected in assisting the patient? Yes Has the patient quit smoking, drinking alcohol, or using drugs as recommended? Yes  Appointment time 1:40 , arrive time 1:20  and who it is with here [facility] [address] [phone]

## 2024-01-06 ENCOUNTER — Emergency Department (HOSPITAL_COMMUNITY)
Admission: EM | Admit: 2024-01-06 | Discharge: 2024-01-06 | Disposition: A | Discharge status: Home / Self Care | Payer: Medicaid Other | Attending: Emergency Medicine | Admitting: Emergency Medicine

## 2024-01-06 ENCOUNTER — Emergency Department (HOSPITAL_COMMUNITY): Payer: Medicaid Other

## 2024-01-06 ENCOUNTER — Other Ambulatory Visit: Payer: Self-pay

## 2024-01-06 ENCOUNTER — Encounter (HOSPITAL_COMMUNITY): Payer: Self-pay | Admitting: *Deleted

## 2024-01-06 DIAGNOSIS — Z7982 Long term (current) use of aspirin: Secondary | ICD-10-CM | POA: Insufficient documentation

## 2024-01-06 DIAGNOSIS — R531 Weakness: Secondary | ICD-10-CM | POA: Diagnosis present

## 2024-01-06 DIAGNOSIS — Z20822 Contact with and (suspected) exposure to covid-19: Secondary | ICD-10-CM | POA: Insufficient documentation

## 2024-01-06 DIAGNOSIS — R42 Dizziness and giddiness: Secondary | ICD-10-CM | POA: Insufficient documentation

## 2024-01-06 DIAGNOSIS — R112 Nausea with vomiting, unspecified: Secondary | ICD-10-CM

## 2024-01-06 DIAGNOSIS — W19XXXA Unspecified fall, initial encounter: Secondary | ICD-10-CM | POA: Insufficient documentation

## 2024-01-06 LAB — CBC WITH DIFFERENTIAL/PLATELET
Abs Immature Granulocytes: 0.04 10*3/uL (ref 0.00–0.07)
Basophils Absolute: 0 10*3/uL (ref 0.0–0.1)
Basophils Relative: 1 %
Eosinophils Absolute: 0.1 10*3/uL (ref 0.0–0.5)
Eosinophils Relative: 1 %
HCT: 48 % (ref 39.0–52.0)
Hemoglobin: 16.5 g/dL (ref 13.0–17.0)
Immature Granulocytes: 1 %
Lymphocytes Relative: 5 %
Lymphs Abs: 0.3 10*3/uL — ABNORMAL LOW (ref 0.7–4.0)
MCH: 31.1 pg (ref 26.0–34.0)
MCHC: 34.4 g/dL (ref 30.0–36.0)
MCV: 90.6 fL (ref 80.0–100.0)
Monocytes Absolute: 0.6 10*3/uL (ref 0.1–1.0)
Monocytes Relative: 9 %
Neutro Abs: 5.5 10*3/uL (ref 1.7–7.7)
Neutrophils Relative %: 83 %
Platelets: 193 10*3/uL (ref 150–400)
RBC: 5.3 MIL/uL (ref 4.22–5.81)
RDW: 13.8 % (ref 11.5–15.5)
WBC: 6.6 10*3/uL (ref 4.0–10.5)
nRBC: 0 % (ref 0.0–0.2)

## 2024-01-06 LAB — COMPREHENSIVE METABOLIC PANEL
ALT: 61 U/L — ABNORMAL HIGH (ref 0–44)
AST: 32 U/L (ref 15–41)
Albumin: 3.8 g/dL (ref 3.5–5.0)
Alkaline Phosphatase: 69 U/L (ref 38–126)
Anion gap: 9 (ref 5–15)
BUN: 13 mg/dL (ref 8–23)
CO2: 24 mmol/L (ref 22–32)
Calcium: 9.3 mg/dL (ref 8.9–10.3)
Chloride: 105 mmol/L (ref 98–111)
Creatinine, Ser: 1.09 mg/dL (ref 0.61–1.24)
GFR, Estimated: >60 mL/min (ref >60)
Glucose, Bld: 154 mg/dL — ABNORMAL HIGH (ref 70–99)
Potassium: 3.8 mmol/L (ref 3.5–5.1)
Sodium: 138 mmol/L (ref 135–145)
Total Bilirubin: 0.8 mg/dL (ref 0.0–1.2)
Total Protein: 7.1 g/dL (ref 6.5–8.1)

## 2024-01-06 LAB — RESP PANEL BY RT-PCR (RSV, FLU A&B, COVID)  RVPGX2
Influenza A by PCR: NEGATIVE
Influenza B by PCR: NEGATIVE
Resp Syncytial Virus by PCR: NEGATIVE
SARS Coronavirus 2 by RT PCR: NEGATIVE

## 2024-01-06 LAB — CBG MONITORING, ED: Glucose-Capillary: 158 mg/dL — ABNORMAL HIGH (ref 70–99)

## 2024-01-06 LAB — TROPONIN I (HIGH SENSITIVITY)
Troponin I (High Sensitivity): <2 ng/L (ref <18)
Troponin I (High Sensitivity): <2 ng/L (ref <18)

## 2024-01-06 LAB — MAGNESIUM: Magnesium: 1.7 mg/dL (ref 1.7–2.4)

## 2024-01-06 MED ORDER — MECLIZINE HCL 12.5 MG PO TABS
25.0000 mg | ORAL_TABLET | Freq: Once | ORAL | Status: AC
  Administered 2024-01-06: 25 mg via ORAL
  Filled 2024-01-06: qty 2

## 2024-01-06 MED ORDER — ONDANSETRON 4 MG PO TBDP: 4.0000 mg | ORAL_TABLET | Freq: Three times a day (TID) | ORAL | 0 refills | Status: AC | PRN

## 2024-01-06 MED ORDER — ONDANSETRON HCL 4 MG/2ML IJ SOLN
4.0000 mg | Freq: Once | INTRAMUSCULAR | Status: AC
  Administered 2024-01-06: 4 mg via INTRAVENOUS
  Filled 2024-01-06: qty 2

## 2024-01-06 NOTE — Discharge Instructions (Signed)
 You were seen for your fall in the emergency department.   At home, please take the Zofran  for any nausea or vomiting.    Check your MyChart online for the results of any tests that had not resulted by the time you left the emergency department.   Follow-up with your primary doctor in 2-3 days regarding your visit.    Return immediately to the emergency department if you experience any of the following: Worsening pain, falls, vomiting despite medication, or any other concerning symptoms.    Thank you for visiting our Emergency Department. It was a pleasure taking care of you today.

## 2024-01-06 NOTE — ED Triage Notes (Signed)
 Pt BIB [facility] from home for fall and dizziness  Pt states he was getting up to let dog out when he got dizzy and fell  Pt c/o pain to left side of face

## 2024-01-06 NOTE — ED Provider Notes (Signed)
 [place] EMERGENCY DEPARTMENT AT [facility] Provider Note   CSN: [id number] Arrival date & time: 01/06/24  0530     History  Chief Complaint  Patient presents with   Ian Wong is a 63 y.o. male.  HPI Patient presents after a fall.  Medical history includes PAD, GERD, seizures, and recent stroke.  4 weeks ago, he had a right MCA stroke.  He underwent TNK, mechanical thrombectomy, and right carotid stenting and angioplasty.  He was placed on aspirin  and Brilinta .  He was discharged from inpatient rehab 6 days ago.  Patient has ongoing weakness in his left arm.  He states that he has been able to walk unassisted since his stroke.  Shiley prior to arrival, patient went to let his dog out.  As he was walking back to bed, he got swimmy headed, and had a fall.  His wife was concerned of syncopal episode or possible seizure, however, patient denies losing consciousness.  His wife was calling for him and he states that he does remember her calling but states that he did not feel like answering.  EMS and wife did not note any postictal phase.  Patient denies any areas of pain or suspected injury from the fall.  He is currently asymptomatic.  He did not want come to the ED with EMS but his wife insisted.    Home Medications Prior to Admission medications   Medication Sig Start Date End Date Taking? Authorizing Provider  acetaminophen  (TYLENOL ) 325 MG tablet Take 2 tablets (650 mg total) by mouth every 4 (four) hours as needed for mild pain (pain score 1-3) (or temp > 37.5 C (99.5 F)). 12/29/23   Angiulli, Toribio PARAS, PA-C  aspirin  81 MG chewable tablet Chew 1 tablet (81 mg total) by mouth daily. 12/12/23   Remi Pippin, NP  atorvastatin  (LIPITOR) 20 MG tablet Take 1 tablet (20 mg total) by mouth every evening. 12/30/23   Angiulli, Toribio PARAS, PA-C  calcium  carbonate (TUMS - DOSED IN MG ELEMENTAL CALCIUM ) 500 MG chewable tablet Chew 1 tablet (200 mg of elemental calcium  total)  by mouth 2 (two) times daily as needed for indigestion or heartburn. 12/11/23   Remi Pippin, NP  docusate (COLACE) 50 MG/5ML liquid Take 10 mLs (100 mg total) by mouth daily. 12/30/23   Angiulli, Toribio PARAS, PA-C  escitalopram  (LEXAPRO ) 5 MG tablet Take 1 tablet (5 mg total) by mouth at bedtime. 12/30/23   Angiulli, Toribio PARAS, PA-C  esomeprazole  (NEXIUM ) 20 MG capsule Take 1 capsule (20 mg total) by mouth daily before breakfast. 12/31/23   Angiulli, Toribio PARAS, PA-C  levothyroxine  (SYNTHROID ) 50 MCG tablet Take 1 tablet (50 mcg total) by mouth daily. 12/30/23   Angiulli, Toribio PARAS, PA-C  olopatadine  (PATANOL) 0.1 % ophthalmic solution Place 1 drop into the left eye 2 (two) times daily. 12/30/23   Angiulli, Toribio PARAS, PA-C  Omega-3 Fatty Acids (OMEGA 3 PO) Take 1 g by mouth 2 (two) times daily.    [provider]  ticagrelor  (BRILINTA ) 90 MG TABS tablet Take 1 tablet (90 mg total) by mouth 2 (two) times daily. 12/30/23   Angiulli, Toribio PARAS, PA-C      Allergies    Patient has no known allergies.    Review of Systems   Review of Systems  Neurological:  Positive for dizziness and light-headedness.  All other systems reviewed and are negative.   Physical Exam Updated Vital Signs BP 123/72  Pulse 98   Temp (!) 97.5 F (36.4 C) (Oral)   Resp (!) 21   Ht 5' 10 (1.778 m)   Wt 96.6 kg   SpO2 94%   BMI 30.56 kg/m  Physical Exam Vitals and nursing note reviewed.  Constitutional:      General: He is not in acute distress.    Appearance: Normal appearance. He is well-developed. He is not ill-appearing, toxic-appearing or diaphoretic.  HENT:     Head: Normocephalic and atraumatic.     Right Ear: External ear normal.     Left Ear: External ear normal.     Nose: Nose normal.     Mouth/Throat:     Mouth: Mucous membranes are moist.  Eyes:     Extraocular Movements: Extraocular movements intact.     Conjunctiva/sclera: Conjunctivae normal.  Cardiovascular:     Rate and Rhythm: Normal rate  and regular rhythm.  Pulmonary:     Effort: Pulmonary effort is normal. No respiratory distress.  Chest:     Chest wall: No tenderness.  Abdominal:     General: There is no distension.     Palpations: Abdomen is soft.     Tenderness: There is no abdominal tenderness.  Musculoskeletal:        General: No swelling, tenderness, deformity or signs of injury.     Cervical back: Normal range of motion and neck supple.  Skin:    General: Skin is warm and dry.     Capillary Refill: Capillary refill takes less than 2 seconds.     Coloration: Skin is not jaundiced or pale.  Neurological:     Mental Status: He is alert and oriented to person, place, and time. Mental status is at baseline.     Motor: Weakness (Baseline left arm weakness.) present.  Psychiatric:        Mood and Affect: Mood normal.        Behavior: Behavior normal.     ED Results / Procedures / Treatments   Labs (all labs ordered are listed, but only abnormal results are displayed) Labs Reviewed  CBC WITH DIFFERENTIAL/PLATELET - Abnormal; Notable for the following components:      Result Value   Lymphs Abs 0.3 (*)    All other components within normal limits  COMPREHENSIVE METABOLIC PANEL - Abnormal; Notable for the following components:   Glucose, Bld 154 (*)    ALT 61 (*)    All other components within normal limits  CBG MONITORING, ED - Abnormal; Notable for the following components:   Glucose-Capillary 158 (*)    All other components within normal limits  RESP PANEL BY RT-PCR (RSV, FLU A&B, COVID)  RVPGX2  MAGNESIUM   URINALYSIS, ROUTINE W REFLEX MICROSCOPIC  TROPONIN I (HIGH SENSITIVITY)  TROPONIN I (HIGH SENSITIVITY)    EKG EKG Interpretation Date/Time:  Wednesday January 06 2024 05:40:05 EST Ventricular Rate:  84 PR Interval:  140 QRS Duration:  83 QT Interval:  365 QTC Calculation: 432 R Axis:   62  Text Interpretation: Sinus rhythm Confirmed by Melvenia Motto ([id number]) on 01/06/2024 6:04:55  AM  Radiology CT CERVICAL SPINE WO CONTRAST Result Date: 01/06/2024 CLINICAL DATA:  63 year old male status post fall. Dizziness. Fell on left face. Status post large right MCA territory infarct in December. EXAM: CT CERVICAL SPINE WITHOUT CONTRAST TECHNIQUE: Multidetector CT imaging of the cervical spine was performed without intravenous contrast. Multiplanar CT image reconstructions were also generated. RADIATION DOSE REDUCTION: This exam was performed according to the  departmental dose-optimization program which includes automated exposure control, adjustment of the mA and/or kV according to patient size and/or use of iterative reconstruction technique. COMPARISON:  CT head and face today. Neck CT 12-30-2023. cervical spine CT 11/30/2023. FINDINGS: Alignment: Stable straightening of cervical lordosis. Cervicothoracic junction alignment is within normal limits. Bilateral posterior element alignment is within normal limits. Skull base and vertebrae: Bone mineralization is within normal limits. Visualized skull base is intact. No atlanto-occipital dissociation. C1 and C2 appear intact and aligned. No acute osseous abnormality identified. Soft tissues and spinal canal: No prevertebral fluid or swelling. No visible canal hematoma. Cervical carotid atherosclerosis and right carotid vascular stent redemonstrated. Disc levels: Mild for age cervical spine degeneration, chiefly disc and endplate degeneration at C5-C6 and C6-C7. Upper chest: Negative. IMPRESSION: 1. No acute traumatic injury identified in the cervical spine. 2. Mild for age cervical spine degeneration. 3. Cervical carotid atherosclerosis, right carotid vascular stent. Electronically Signed   By: VEAR Hurst M.D.   On: 01/06/2024 07:10   CT Maxillofacial Wo Contrast Result Date: 01/06/2024 CLINICAL DATA:  63 year old male status post fall. Dizziness. Fell on left face. Status post large right MCA territory infarct in December. EXAM: CT MAXILLOFACIAL WITHOUT  CONTRAST TECHNIQUE: Multidetector CT imaging of the maxillofacial structures was performed. Multiplanar CT image reconstructions were also generated. RADIATION DOSE REDUCTION: This exam was performed according to the departmental dose-optimization program which includes automated exposure control, adjustment of the mA and/or kV according to patient size and/or use of iterative reconstruction technique. COMPARISON:  Head and cervical spine CT today, and earlier. FINDINGS: Osseous: Mandible intact and normally located. Absent maxillary and carious mandible dentition. Bilateral maxilla, zygoma, pterygoid bones appear intact. Central skull base appears intact. Chronic nasal bone fractures appear stable from neck CT 12-30-2023, more pronounced on the right. Orbits: Chronic left lamina papyracea fracture. No acute orbital wall fracture. Globes and intraorbital soft tissues appears symmetric and negative. Sinuses: Paranasal sinuses, tympanic cavities and mastoids are essentially clear. Soft tissues: Mild superficial soft tissue hematoma or contusion overlying the left zygomaticomaxillary confluence, left premalar space series 2, image 32. Right carotid vascular stent and left carotid atherosclerosis redemonstrated in the neck. Otherwise negative visible noncontrast deep soft tissue spaces. Limited intracranial: Reported separately today. IMPRESSION: 1. Mild superficial soft tissue injury of the left face. 2. No acute facial fracture identified. Chronic nasal bone and left lamina papyracea fractures. 3. Carotid atherosclerosis, cervical right carotid vascular stent. Electronically Signed   By: VEAR Hurst M.D.   On: 01/06/2024 07:08   CT HEAD WO CONTRAST Result Date: 01/06/2024 CLINICAL DATA:  63 year old male status post fall. Dizziness. Fell on left face. Status post large right MCA territory infarct in December. EXAM: CT HEAD WITHOUT CONTRAST TECHNIQUE: Contiguous axial images were obtained from the base of the skull  through the vertex without intravenous contrast. RADIATION DOSE REDUCTION: This exam was performed according to the departmental dose-optimization program which includes automated exposure control, adjustment of the mA and/or kV according to patient size and/or use of iterative reconstruction technique. COMPARISON:  Brain MRI 02/31/2024. Face and cervical spine CT today reported separately. FINDINGS: Brain: Expected evolution of right MCA infarct from December and 12-30-2023 comparisons. Developing right hemisphere encephalomalacia now with ex vacuo enlargement of the right lateral ventricle. Superimposed Patchy and confluent widespread bilateral white matter hypodensity. No midline shift, ventriculomegaly, mass effect, evidence of mass lesion, intracranial hemorrhage or evidence of cortically based acute infarction. Vascular: Persistent hyperdense right MCA. Calcified atherosclerosis at the  skull base. Skull: Chronic left lamina papyracea fracture. No acute osseous abnormality identified. Sinuses/Orbits: Visualized paranasal sinuses and mastoids are well aerated. Other: No acute orbit or scalp soft tissue injury is identified. IMPRESSION: 1. No acute traumatic injury identified. Chronic left lamina papyracea fracture. 2. Expected evolution of large Right MCA territory infarct since December with developing encephalomalacia. No acute intracranial abnormality. Electronically Signed   By: VEAR Hurst M.D.   On: 01/06/2024 07:03    Procedures Procedures    Medications Ordered in ED Medications  ondansetron  (ZOFRAN ) injection 4 mg (4 mg Intravenous Given 01/06/24 0620)  meclizine  (ANTIVERT ) tablet 25 mg (25 mg Oral Given 01/06/24 0654)    ED Course/ Medical Decision Making/ A&P Clinical Course as of 01/06/24 0737  Wed Jan 06, 2024  0708 Assumed care from Dr Melvenia.  63 year old male history of PAD, GERD, seizures, and right MCA stroke with residual left-sided weakness who presents with fall with possible syncope vs  seizure. Pt says he remembers everything but wife is concerned it wasn't mechanical and that there may have been a seizure.  Unable to get additional history from her at this time. Has hx of seizures.  There are some concern that it was not a seizure because there was no post ictal phase and the patient reports being aware of the entire event. Has residual L arm weakness after a stroke recently on his exam today but no other deficits. When he sat up for x-ray had vertigo and vomited here in the ED. Getting CT scans. If negative and tolerating PO can be dc'd.  [RP]    Clinical Course User Index [RP] Yolande Lamar BROCKS, MD                                 Medical Decision Making Amount and/or Complexity of Data Reviewed Labs: ordered. Radiology: ordered.  Risk Prescription drug management.   This patient presents to the ED for concern of fall, this involves an extensive number of treatment options, and is a complaint that carries with it a high risk of complications and morbidity.  The differential diagnosis includes syncope, seizure, CVA, acute injuries   Co morbidities that complicate the patient evaluation  PAD, GERD, seizures, and recent stroke   Additional history obtained:  Additional history obtained from EMS, patient's sister External records from outside source obtained and reviewed including EMR   Lab Tests:  I Ordered, and personally interpreted labs.  The pertinent results include: Normal hemoglobin, no leukocytosis, normal kidney function, normal electrolytes, normal troponin   Imaging Studies ordered:  I ordered imaging studies including CT of head, face, cervical spine, chest x-ray I independently visualized and interpreted imaging which showed expected evolution of recent right MCA stroke without any other acute findings I agree with the radiologist interpretation   Cardiac Monitoring: / EKG:  The patient was maintained on a cardiac monitor.  I personally  viewed and interpreted the cardiac monitored which showed an underlying rhythm of: Sinus rhythm  Problem List / ED Course / Critical interventions / Medication management  Patient presents after fall.  He describes near syncopal symptoms of lightheadedness and dizziness that caused a fall.  On arrival in the ED, he is alert and oriented.  He is overall well-appearing.  He has no areas of new pain or tenderness.  He has no deformities on exam.  He has left arm weakness which is his baseline since  his recent stroke.  Patient was placed on cardiac monitor.  Workup was initiated.  Patient's lab work is unremarkable.  When sitting up for chest x-ray, patient had worsened dizziness and vomiting.  Zofran  was ordered.  Once nausea subsides, will provide dose of meclizine .  Initial lab work is reassuring.  Imaging study showed expected evolution of recent right MCA stroke.  Care of patient was signed out to oncoming ED provider. I ordered medication including Zofran  and meclizine  for dizziness and nausea Reevaluation of the patient after these medicines showed that the patient improved I have reviewed the patients home medicines and have made adjustments as needed   Social Determinants of Health:  Lives at home with wife, caregiver to special needs son        Final Clinical Impression(s) / ED Diagnoses Final diagnoses:  Fall, initial encounter    Rx / DC Orders ED Discharge Orders     None         Melvenia Motto, MD 01/06/24 [phone]

## 2024-01-06 NOTE — ED Provider Notes (Signed)
  Physical Exam  BP 121/73   Pulse 85   Temp (!) 97.5 F (36.4 C) (Oral)   Resp 20   Ht 5' 10 (1.778 m)   Wt 96.6 kg   SpO2 95%   BMI 30.56 kg/m   Physical Exam  Procedures  Procedures  ED Course / MDM   Clinical Course as of 01/06/24 0927  Wed Jan 06, 2024  0708 Assumed care from Dr Melvenia.  63 year old male history of PAD, GERD, seizures, and right MCA stroke with residual left-sided weakness who presents with fall with possible syncope vs seizure. Pt says he remembers everything but wife is concerned it wasn't mechanical and that there may have been a seizure.  Unable to get additional history from her at this time. Has hx of seizures.  There are some concern that it was not a seizure because there was no post ictal phase and the patient reports being aware of the entire event. Has residual L arm weakness after a stroke recently on his exam today but no other deficits. When he sat up for x-ray had vertigo and vomited here in the ED. Getting CT scans. If negative and tolerating PO can be dc'd.  [RP]  9074 Patient reevaluated.  Able to ambulate without assistance.  He is now tolerating p.o.  I did discuss the fall with him and he reports that he did not pass out at all.  Says that he was walking and his leg got tripped up on the wall and that he fell.  No loss of consciousness.  Not feeling dizzy at this time.  Will discharge him with a prescription of Zofran  since his family members saying that he has had some nausea and vomiting recently and diarrhea. [RP]    Clinical Course User Index [RP] Yolande Lamar BROCKS, MD   Medical Decision Making Amount and/or Complexity of Data Reviewed Labs: ordered. Radiology: ordered.  Risk Prescription drug management.      Yolande Lamar BROCKS, MD 01/06/24 [phone]

## 2024-01-06 NOTE — ED Notes (Signed)
 Received report from [employer] RN, Pt resting. Nad. Aware awaiting urine sample. Pt will try. A/o. Denies pain.

## 2024-01-07 ENCOUNTER — Telehealth: Payer: Self-pay | Admitting: Physical Medicine & Rehabilitation

## 2024-01-07 NOTE — Telephone Encounter (Signed)
 Ian Wong from [facility] call requesting to move home health OT evaluation to next week

## 2024-01-13 ENCOUNTER — Encounter: Payer: Medicaid Other | Attending: Registered Nurse | Admitting: Registered Nurse

## 2024-01-13 VITALS — BP 109/75 | HR 78 | Ht 70.0 in | Wt 191.0 lb

## 2024-01-13 DIAGNOSIS — I63511 Cerebral infarction due to unspecified occlusion or stenosis of right middle cerebral artery: Secondary | ICD-10-CM

## 2024-01-13 DIAGNOSIS — E7849 Other hyperlipidemia: Secondary | ICD-10-CM

## 2024-01-13 MED ORDER — TICAGRELOR 90 MG PO TABS: 90.0000 mg | ORAL_TABLET | Freq: Two times a day (BID) | ORAL | 0 refills | Status: AC

## 2024-01-13 NOTE — Progress Notes (Signed)
 Subjective:    Patient ID: Ian Wong, male    DOB: 21-Jun-1961, 63 y.o.   MRN: [id number]  HPI: Ian Wong is a 63 y.o. male who is here for Transitional Care Visit for follow up of his  Right Middle Cerebral Artery Stroke and Hyperlipidemia. He presented to [facility] on 11/30/2023, with right gaze deviation and left hemiplegia and slurred speech, via EMS..  Dr. Derry Lory H&P: 11/30/2023 Ian Wong is a 63 y.o. male with history of GERD, hypothyroidism, seizures who presented to [facility] ED with persistent right gaze deviation and left hemiplegia.  Last known well was 12:45 PM.  Family called EMS when his symptoms started.  They noted that he was weak on the left and his speech was slurring.  CT head without contrast with aspects of 10 and no large territory infarct or intracranial hemorrhage.  CTA demonstrated occlusion of the right ICA with distal intracranial reconstitution and then occlusion of the right MCA.  He was given TNKase and was transferred to [facility] for emergent thrombectomy.     I spoke with his wife Ms. [name] who confirmed that she had consented for both TNKase administration and and for thrombectomy.   I evaluated Ian Wong at the ED bridge upon arrival to [facility].  He still has persistent left-sided weakness and right gaze with left-sided sensory deficit, left hemianopsia and left sensory neglect.  He underwent  cerebral angiogram mechanical thrombectomy.   CT Head: WO Contrast:  IMPRESSION: 1. No acute intracranial abnormality. 2. Chronic small vessel disease. 3. Exam quality is markedly degraded by streak artifacts from outside the field of view.  MR: Brain WO Contrast:  IMPRESSION: 1. Fairly extensive acute right middle cerebral artery territory infarcts. There are a few superimposed small foci of susceptibility-weighted signal loss within the right basal ganglia and within/along the right  insula, which may reflect chronic microhemorrhages or acute petechial hemorrhage (Heidelberg class 1a, type HI1 scattered small petechiae, no mass effect). Consider a non-contrast head CT for further evaluation. 2. Few nonspecific punctate chronic microhemorrhages scattered elsewhere within the supratentorial brain. 3. Background moderate cerebral white matter chronic small vessel ischemic disease. 4. Chronic lacunar infarct within the left thalamus.   Dr. Roda Shutters note: 12/11/2023 HOSPITAL COURSE Stroke:  right MCA infarct due to right ICA and MCA occlusion s/p TNK and IR with TICI3 and right carotid stent, etiology:  large vessel disease Code Stroke CT head, negative for acute finding  CTA head & neck - R ICA occlusion with distal intracranial reconstitution before occlusion of the proximal right MCA M1 with poor opacification of the distal vessels.  S/p IR with TICI3 and right CAS MRI: moderate acute right MCA territory infarcts, chronic microhemorrhages versus acute petechial hemorrhages seen along the right basal ganglia. Chronic lacunar infarct within the left thalamus CT repeat stable infarct  2D Echo: LVEF 50 to 55% LDL 57 HgbA1c 5.9 UDS neg VTE prophylaxis - lovenox aspirin 81 mg daily prior to admission, continue aspirin 81 mg daily and Brilinta (ticagrelor) 90 mg bid for carotid stenting Therapy recommendations: CIR    [name] was admitted to inpatient rehabilitation on 12/11/2023 and discharged home on 12/31/2023. He denies any pain. He rates his pain 0. He is scheduled to receive Home Health Therapy with [facility]. He will call office, if [facility] doesn't call him by tomorrow. He verbalizes understanding.    Pain Inventory Average Pain 0 Pain Right Now  0 My pain is  none    BOWEL Number of stools per week: 5   BLADDER Normal    Mobility walk without assistance walk with assistance ability to climb steps?  no do you drive?  no  Function disabled: date  disabled . I need assistance with the following:  dressing, bathing, and meal prep  Neuro/Psych weakness numbness dizziness  Prior Studies Any changes since last visit?  no  Physicians involved in your care Any changes since last visit?  no   Family History  Problem Relation Age of Onset   COPD Mother    Other Brother    Social History   Socioeconomic History   Marital status: Married    Spouse name: Not on file   Number of children: Not on file   Years of education: Not on file   Highest education level: Not on file  Occupational History   Not on file  Tobacco Use   Smoking status: Former    Current packs/day: 0.00    Types: Cigarettes    Quit date: 12/2016    Years since quitting: 7.1   Smokeless tobacco: Never  Vaping Use   Vaping status: Never Used  Substance and Sexual Activity   Alcohol use: Not Currently    Comment: last drank etoh 1 1/2 months ago.   Drug use: No   Sexual activity: Not on file  Other Topics Concern   Not on file  Social History Narrative   Not on file   Social Drivers of Health   Financial Resource Strain: Not on file  Food Insecurity: Not on file  Transportation Needs: Not on file  Physical Activity: Not on file  Stress: Not on file  Social Connections: Not on file   Past Surgical History:  Procedure Laterality Date   AORTA - BILATERAL FEMORAL ARTERY BYPASS GRAFT Bilateral 01/07/2017   Procedure: AORTOBIFEMORAL BYPASS GRAFT;  Surgeon: Larina Earthly, MD;  Location: [facility] OR;  Service: Vascular;  Laterality: Bilateral;   I & D EXTREMITY Left 11/28/2014   Procedure: IRRIGATION AND DEBRIDEMENT;  REPAIR OF CRUSH INJURY;  Surgeon: Betha Loa, MD;  Location: [facility] OR;  Service: Orthopedics;  Laterality: Left;   INCISIONAL HERNIA REPAIR N/A 10/26/2017   Procedure: HERNIA REPAIR INCISIONAL WITH MESH;  Surgeon: Franky Macho, MD;  Location: [facility] ORS;  Service: General;  Laterality: N/A;   IR CT HEAD LTD  11/30/2023   IR INTRAVSC STENT CERV  CAROTID W/EMB-PROT MOD SED INCL ANGIO  11/30/2023   IR PERCUTANEOUS ART THROMBECTOMY/INFUSION INTRACRANIAL INC DIAG ANGIO  11/30/2023   IR US GUIDE VASC ACCESS RIGHT  11/30/2023   RADIOLOGY WITH ANESTHESIA N/A 11/30/2023   Procedure: IR WITH ANESTHESIA;  Surgeon: Radiologist, Medication, MD;  Location: [facility] OR;  Service: Radiology;  Laterality: N/A;   Past Medical History:  Diagnosis Date   GERD (gastroesophageal reflux disease)    Hypothyroidism    Seizures (HCC)    BP 109/75   Pulse 78   Ht 5\' 10"  (1.778 m)   Wt 191 lb (86.6 kg)   SpO2 97%   BMI 27.41 kg/m   Opioid Risk Score:   Fall Risk Score:  `1  Depression screen PHQ 2/9      No data to display            Review of Systems  All other systems reviewed and are negative.     Objective:   Physical Exam Vitals and nursing note reviewed.  Constitutional:  Appearance: Normal appearance.  Cardiovascular:     Rate and Rhythm: Normal rate and regular rhythm.     Pulses: Normal pulses.     Heart sounds: Normal heart sounds.  Pulmonary:     Effort: Pulmonary effort is normal.     Breath sounds: Normal breath sounds.  Musculoskeletal:     Comments: Normal Muscle Bulk and Muscle Testing Reveals:  Upper Extremities:Right: Full  ROM and Muscle Strength 5/5 Left Upper Extremity: Decreased ROM 45 Degrees and Muscle Strength 5/5  Lower Extremities: Full ROM and Muscle Strength 5/5 Arises from Table with ease Narrow Based     Skin:    General: Skin is warm and dry.  Neurological:     Mental Status: He is alert and oriented to person, place, and time.  Psychiatric:        Mood and Affect: Mood normal.        Behavior: Behavior normal.         Assessment & Plan:  1. Right Middle Cerebral Artery Stroke: Continue current medication regimen. He has a scheduled appointment with Neurology. Continue  Home Health Therapy with [facility].  2. Hyperlipidemia.: Continue current medication regimen.  F/U with your PCP    F/U with Dr Wynn Banker in 4- 6 weeks

## 2024-01-13 NOTE — Patient Instructions (Signed)
Call your Primary Care Physician to see if you can get a sooner appointment. Your Scheduled for 02/29/2024.   Check your maintenance medication to make sure you have refills on them.   Call [place] tomorrow if you don't hear from [facility] : By 1:00

## 2024-01-14 ENCOUNTER — Telehealth: Payer: Self-pay

## 2024-01-14 NOTE — Telephone Encounter (Signed)
Mallory, OT from [facility] 1wk7. Orders approved and given.

## 2024-02-03 NOTE — Progress Notes (Unsigned)
 [facility] [address] [place]. [place] [address] [phone]       HOSPITAL FOLLOW UP NOTE  [name] Date of Birth:  18-Nov-1961 Medical Record Number:  [id number]   Reason for Referral:  hospital stroke follow up    SUBJECTIVE:   CHIEF COMPLAINT:  No chief complaint on file.   HPI:   [name] is a 63 y.o. male with past medical history significant for seizures, hypothyroidism, GERD who presented to [facility] ED 11/30/2023 with persistent right gaze deviation and left hemiplegia. 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 Patient was given TNK at [facility] and was transferred to [facility] for emergent thrombectomy.  He was emergently taken to IR with persistent left-sided weakness, right gaze, left-sided sensory deficit, left hemianopsia and left sensory neglect s/p TICI 3 reperfusion and right carotid stent.  MRI showed moderate acute right MCA territory infarcts, chronic microhemorrhages vs acute petechial hemorrhages seen along the right BG and chronic left thalamic lacunar infarct.  Repeat CT head stable.  EF 50 to 55%.  LDL 57.  A1c 5.9.  Placed on aspirin and Brilinta post stenting and advised outpatient IR follow-up.  Remained on home dose atorvastatin 20 mg daily.  Residual deficits of moderate dysarthria, dysphagia, LUE weakness and dysmetria.  He was discharged to [facility] for ongoing therapy needs.        PERTINENT IMAGING  Code Stroke CT head, negative for acute finding  CTA head & neck - R ICA occlusion with distal intracranial reconstitution before occlusion of the proximal right MCA M1 with poor opacification of the distal vessels.  S/p IR with TICI3 and right CAS MRI: moderate acute right MCA territory infarcts, chronic microhemorrhages versus acute petechial hemorrhages seen along the right basal ganglia. Chronic lacunar infarct within the left thalamus CT repeat stable  infarct  2D Echo: LVEF 50 to 55% LDL 57 HgbA1c 5.9    ROS:   14 system review of systems performed and negative with exception of ***  PMH:  Past Medical History:  Diagnosis Date   GERD (gastroesophageal reflux disease)    Hypothyroidism    Seizures (HCC)     PSH:  Past Surgical History:  Procedure Laterality Date   AORTA - BILATERAL FEMORAL ARTERY BYPASS GRAFT Bilateral 01/07/2017   Procedure: AORTOBIFEMORAL BYPASS GRAFT;  Surgeon: Larina Earthly, MD;  Location: [facility] OR;  Service: Vascular;  Laterality: Bilateral;   I & D EXTREMITY Left 11/28/2014   Procedure: IRRIGATION AND DEBRIDEMENT;  REPAIR OF CRUSH INJURY;  Surgeon: Betha Loa, MD;  Location: [facility] OR;  Service: Orthopedics;  Laterality: Left;   INCISIONAL HERNIA REPAIR N/A 10/26/2017   Procedure: HERNIA REPAIR INCISIONAL WITH MESH;  Surgeon: Franky Macho, MD;  Location: [facility] ORS;  Service: General;  Laterality: N/A;   IR CT HEAD LTD  11/30/2023   IR INTRAVSC STENT CERV CAROTID W/EMB-PROT MOD SED INCL ANGIO  11/30/2023   IR PERCUTANEOUS ART THROMBECTOMY/INFUSION INTRACRANIAL INC DIAG ANGIO  11/30/2023   IR US GUIDE VASC ACCESS RIGHT  11/30/2023   RADIOLOGY WITH ANESTHESIA N/A 11/30/2023   Procedure: IR WITH ANESTHESIA;  Surgeon: Radiologist, Medication, MD;  Location: [facility] OR;  Service: Radiology;  Laterality: N/A;    Social History:  Social History   Socioeconomic History   Marital status: Married    Spouse name: Not on file   Number of children: Not on file  Years of education: Not on file   Highest education level: Not on file  Occupational History   Not on file  Tobacco Use   Smoking status: Former    Current packs/day: 0.00    Types: Cigarettes    Quit date: 12/2016    Years since quitting: 7.1   Smokeless tobacco: Never  Vaping Use   Vaping status: Never Used  Substance and Sexual Activity   Alcohol use: Not Currently    Comment: last drank etoh 1 1/2 months ago.   Drug use: No   Sexual activity: Not on  file  Other Topics Concern   Not on file  Social History Narrative   Not on file   Social Drivers of Health   Financial Resource Strain: Not on file  Food Insecurity: Not on file  Transportation Needs: Not on file  Physical Activity: Not on file  Stress: Not on file  Social Connections: Not on file  Intimate Partner Violence: Not on file    Family History:  Family History  Problem Relation Age of Onset   COPD Mother    Other Brother     Medications:   Current Outpatient Medications on File Prior to Visit  Medication Sig Dispense Refill   acetaminophen (TYLENOL) 325 MG tablet Take 2 tablets (650 mg total) by mouth every 4 (four) hours as needed for mild pain (pain score 1-3) (or temp > 37.5 C (99.5 F)).     aspirin 81 MG chewable tablet Chew 1 tablet (81 mg total) by mouth daily.     atorvastatin (LIPITOR) 20 MG tablet Take 1 tablet (20 mg total) by mouth every evening. 30 tablet 0   calcium carbonate (TUMS - DOSED IN MG ELEMENTAL CALCIUM) 500 MG chewable tablet Chew 1 tablet (200 mg of elemental calcium total) by mouth 2 (two) times daily as needed for indigestion or heartburn.     escitalopram (LEXAPRO) 5 MG tablet Take 1 tablet (5 mg total) by mouth at bedtime. 30 tablet 0   esomeprazole (NEXIUM) 20 MG capsule Take 1 capsule (20 mg total) by mouth daily before breakfast. 30 capsule 0   levothyroxine (SYNTHROID) 50 MCG tablet Take 1 tablet (50 mcg total) by mouth daily. 30 tablet 0   olopatadine (PATANOL) 0.1 % ophthalmic solution Place 1 drop into the left eye 2 (two) times daily. 5 mL 12   Omega-3 Fatty Acids (OMEGA 3 PO) Take 1 g by mouth 2 (two) times daily.     ondansetron (ZOFRAN-ODT) 4 MG disintegrating tablet Take 1 tablet (4 mg total) by mouth every 8 (eight) hours as needed for nausea or vomiting. 12 tablet 0   ticagrelor (BRILINTA) 90 MG TABS tablet Take 1 tablet (90 mg total) by mouth 2 (two) times daily. 60 tablet 0   No current facility-administered medications  on file prior to visit.    Allergies:  No Known Allergies    OBJECTIVE:  Physical Exam  There were no vitals filed for this visit. There is no height or weight on file to calculate BMI. No results found.   General: well developed, well nourished, seated, in no evident distress Head: head normocephalic and atraumatic.   Neck: supple with no carotid or supraclavicular bruits Cardiovascular: regular rate and rhythm, no murmurs Musculoskeletal: no deformity Skin:  no rash/petichiae Vascular:  Normal pulses all extremities   Neurologic Exam Mental Status: Awake and fully alert. Oriented to place and time. Recent and remote memory intact. Attention span, concentration and  fund of knowledge appropriate. Mood and affect appropriate.  Cranial Nerves: Fundoscopic exam reveals sharp disc margins. Pupils equal, briskly reactive to light. Extraocular movements full without nystagmus. Visual fields full to confrontation. Hearing intact. Facial sensation intact. Face, tongue, palate moves normally and symmetrically.  Motor: Normal bulk and tone. Normal strength in all tested extremity muscles Sensory.: intact to touch , pinprick , position and vibratory sensation.  Coordination: Rapid alternating movements normal in all extremities. Finger-to-nose and heel-to-shin performed accurately bilaterally. Gait and Station: Arises from chair without difficulty. Stance is normal. Gait demonstrates normal stride length and balance with ***. Tandem walk and heel toe ***.  Reflexes: 1+ and symmetric. Toes downgoing.     NIHSS  *** Modified Rankin  ***      ASSESSMENT: Ian Wong is a 63 y.o. year old male with right MCA infarcts due to right ICA and MCA occlusion on 11/30/2023 s/p TNK and IR with TICI 3 reperfusion and right carotid stent, etiology secondary to large vessel disease. Vascular risk factors include HLD, large vessel disease, hx of HTN and former tobacco use. Hx of remote seizures  at young age without any recurrent seizure activity and no current use of ASM.     PLAN:  Right MCA infarcts:  Residual deficit: ***.  Continue aspirin 81mg  daily and atorvastatin (Lipitor) for secondary stroke prevention managed/prescribed by PCP.   Discussed secondary stroke prevention measures and importance of close PCP follow up for aggressive stroke risk factor management including BP goal<130/90, HLD with LDL goal<70 and DM with A1c.<7 .  Stroke labs 11/2023: LDL 57, A1c 5.9 I have gone over the pathophysiology of stroke, warning signs and symptoms, risk factors and their management in some detail with instructions to go to the closest emergency room for symptoms of concern. S/p right carotid stent: Remains on Brilinta per IR recommendations.  Needs follow-up visit with IR who recommended carotid ultrasound 3 months post stent. Will reach out to IR to assist with scheduling ***    Follow up in *** or call earlier if needed   CC:  [facility] provider: Dr. Pearlean Brownie PCP: Patient, No Pcp Per    I spent *** minutes of face-to-face and non-face-to-face time with patient.  This included previsit chart review including review of recent hospitalization, lab review, study review, order entry, electronic health record documentation, patient education regarding recent stroke including etiology, secondary stroke prevention measures and importance of managing stroke risk factors, residual deficits and typical recovery time and answered all other questions to patient satisfaction   Ihor Austin, AGNP-BC  [facility] [address] [place], [place] [address]  Phone [phone] Fax [phone] Note: This document was prepared with digital dictation and possible smart phrase technology. Any transcriptional errors that result from this process are unintentional.

## 2024-02-04 ENCOUNTER — Encounter: Payer: Self-pay | Admitting: Adult Health

## 2024-02-04 ENCOUNTER — Ambulatory Visit: Payer: Medicaid Other | Admitting: Adult Health

## 2024-02-04 VITALS — BP 133/73 | HR 76 | Ht 71.0 in | Wt 190.0 lb

## 2024-02-04 DIAGNOSIS — R269 Unspecified abnormalities of gait and mobility: Secondary | ICD-10-CM

## 2024-02-04 DIAGNOSIS — I63231 Cerebral infarction due to unspecified occlusion or stenosis of right carotid arteries: Secondary | ICD-10-CM

## 2024-02-04 DIAGNOSIS — Z95828 Presence of other vascular implants and grafts: Secondary | ICD-10-CM

## 2024-02-04 DIAGNOSIS — G8114 Spastic hemiplegia affecting left nondominant side: Secondary | ICD-10-CM | POA: Diagnosis not present

## 2024-02-04 DIAGNOSIS — I69398 Other sequelae of cerebral infarction: Secondary | ICD-10-CM | POA: Diagnosis not present

## 2024-02-04 NOTE — Patient Instructions (Addendum)
 Continue working with home health therapies -consider outpatient therapies once completed  You should be contacted by interventional radiology to schedule a carotid ultrasound to follow-up on right carotid stent  Ongoing use of Brilinta will be determined by interventional radiology  Continue aspirin 81 mg daily  and atorvastatin for secondary stroke prevention  Continue to follow up with PCP regarding blood pressure and cholesterol management  Maintain strict control of hypertension with blood pressure goal below 130/90 and cholesterol with LDL cholesterol (bad cholesterol) goal below 70 mg/dL.   Signs of a Stroke? Follow the BEFAST method:  Balance Watch for a sudden loss of balance, trouble with coordination or vertigo Eyes Is there a sudden loss of vision in one or both eyes? Or double vision?  Face: Ask the person to smile. Does one side of the face droop or is it numb?  Arms: Ask the person to raise both arms. Does one arm drift downward? Is there weakness or numbness of a leg? Speech: Ask the person to repeat a simple phrase. Does the speech sound slurred/strange? Is the person confused ? Time: If you observe any of these signs, call 911.        Thank you for coming to see [place] at [facility]. I hope we have been able to provide you high quality care today.  You may receive a patient satisfaction survey over the next few weeks. We would appreciate your feedback and comments so that we may continue to improve ourselves and the health of our patients.   Stroke Prevention Some medical conditions and lifestyle choices can lead to a higher risk for a stroke. You can help to prevent a stroke by eating healthy foods and exercising. It also helps to not smoke and to manage any health problems you may have. How can this condition affect me? A stroke is an emergency. It should be treated right away. A stroke can lead to brain damage or threaten your life. There is a  better chance of surviving and getting better after a stroke if you get medical help right away. What can increase my risk? The following medical conditions may increase your risk of a stroke: Diseases of the heart and blood vessels (cardiovascular disease). High blood pressure (hypertension). Diabetes. High cholesterol. Sickle cell disease. Problems with blood clotting. Being very overweight. Sleeping problems (obstructivesleep apnea). Other risk factors include: Being older than age 75. A history of blood clots, stroke, or mini-stroke (TIA). Race, ethnic background, or a family history of stroke. Smoking or using tobacco products. Taking birth control pills, especially if you smoke. Heavy alcohol and drug use. Not being active. What actions can I take to prevent this? Manage your health conditions High cholesterol. Eat a healthy diet. If this is not enough to manage your cholesterol, you may need to take medicines. Take medicines as told by your doctor. High blood pressure. Try to keep your blood pressure below 130/80. If your blood pressure cannot be managed through a healthy diet and regular exercise, you may need to take medicines. Take medicines as told by your doctor. Ask your doctor if you should check your blood pressure at home. Have your blood pressure checked every year. Diabetes. Eat a healthy diet and get regular exercise. If your blood sugar (glucose) cannot be managed through diet and exercise, you may need to take medicines. Take medicines as told by your doctor. Talk to your doctor about getting checked for sleeping problems. Signs of a problem can  include: Snoring a lot. Feeling very tired. Make sure that you manage any other conditions you have. Nutrition  Follow instructions from your doctor about what to eat or drink. You may be told to: Eat and drink fewer calories each day. Limit how much salt (sodium) you use to 1,500 milligrams (mg) each day. Use  only healthy fats for cooking, such as olive oil, canola oil, and sunflower oil. Eat healthy foods. To do this: Choose foods that are high in fiber. These include whole grains, and fresh fruits and vegetables. Eat at least 5 servings of fruits and vegetables a day. Try to fill one-half of your plate with fruits and vegetables at each meal. Choose low-fat (lean) proteins. These include low-fat cuts of meat, chicken without skin, fish, tofu, beans, and nuts. Eat low-fat dairy products. Avoid foods that: Are high in salt. Have saturated fat. Have trans fat. Have cholesterol. Are processed or pre-made. Count how many carbohydrates you eat and drink each day. Lifestyle If you drink alcohol: Limit how much you have to: 0-1 drink a day for women who are not pregnant. 0-2 drinks a day for men. Know how much alcohol is in your drink. In the [place] one drink equals one 12 oz bottle of beer ( ), one 5 oz glass of wine ( ), or one 1 oz glass of hard liquor (44mL). Do not smoke or use any products that have nicotine or tobacco. If you need help quitting, ask your doctor. Avoid secondhand smoke. Do not use drugs. Activity  Try to stay at a healthy weight. Get at least 30 minutes of exercise on most days, such as: Fast walking. Biking. Swimming. Medicines Take over-the-counter and prescription medicines only as told by your doctor. Avoid taking birth control pills. Talk to your doctor about the risks of taking birth control pills if: You are over 66 years old. You smoke. You get very bad headaches. You have had a blood clot. Where to find more information American Stroke Association: www.strokeassociation.org Get help right away if: You or a loved one has any signs of a stroke. "BE FAST" is an easy way to remember the warning signs: B - Balance. Dizziness, sudden trouble walking, or loss of balance. E - Eyes. Trouble seeing or a change in how you see. F - Face. Sudden weakness or  loss of feeling of the face. The face or eyelid may droop on one side. A - Arms. Weakness or loss of feeling in an arm. This happens all of a sudden and most often on one side of the body. S - Speech. Sudden trouble speaking, slurred speech, or trouble understanding what people say. T - Time. Time to call emergency services. Write down what time symptoms started. You or a loved one has other signs of a stroke, such as: A sudden, very bad headache with no known cause. Feeling like you may vomit (nausea). Vomiting. A seizure. These symptoms may be an emergency. Get help right away. Call your local emergency services (911 in the [place] Do not wait to see if the symptoms will go away. Do not drive yourself to the hospital. Summary You can help to prevent a stroke by eating healthy, exercising, and not smoking. It also helps to manage any health problems you have. Do not smoke or use any products that contain nicotine or tobacco. Get help right away if you or a loved one has any signs of a stroke. This information is not intended to replace advice given to  you by your health care provider. Make sure you discuss any questions you have with your health care provider. Document Revised: 10/20/2022 Document Reviewed: 10/20/2022 Elsevier Patient Education  2024 [employer].

## 2024-02-05 NOTE — Progress Notes (Signed)
 I agree with the above plan

## 2024-02-12 ENCOUNTER — Encounter: Payer: Self-pay | Admitting: Physical Medicine & Rehabilitation

## 2024-02-12 ENCOUNTER — Encounter: Payer: Medicaid Other | Attending: Physical Medicine & Rehabilitation | Admitting: Physical Medicine & Rehabilitation

## 2024-02-12 VITALS — BP 122/72 | HR 70 | Ht 71.0 in | Wt 188.0 lb

## 2024-02-12 DIAGNOSIS — I63511 Cerebral infarction due to unspecified occlusion or stenosis of right middle cerebral artery: Secondary | ICD-10-CM | POA: Insufficient documentation

## 2024-02-12 DIAGNOSIS — G8114 Spastic hemiplegia affecting left nondominant side: Secondary | ICD-10-CM | POA: Diagnosis present

## 2024-02-12 DIAGNOSIS — M7542 Impingement syndrome of left shoulder: Secondary | ICD-10-CM | POA: Diagnosis not present

## 2024-02-12 NOTE — Progress Notes (Signed)
 Subjective:    Patient ID: Ian Wong, male    DOB: June 24, 1961, 63 y.o.   MRN: [id number] 63 y.o. right-handed male with history significant for GERD hypothyroidism, remote seizure, PAD with bilateral femoral artery bypass 2018, quit smoking 7 years ago.  Per chart review lives with spouse.  Mobile home with a ramped entrance.  Independent prior to admission.  They do have a son that is disabled.  Presented to [facility] 11/30/2023 with right gaze deviation and left hemiplegia/slurred speech of acute onset.  Cranial CT scan showed hyperdense right M1 MCA concerning for thrombus.  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  Patient did receive TNK at [facility].  CT angiogram of the head and neck occluded right ICA at its origin with nonopacification throughout the neck.  Severe right vertebral artery origin stenosis.  Severe stenosis of the intradural small/nondominant left vertebral artery.  Approximately 70% stenosis of the left ICA origin.  Admission chemistries unremarkable except WBC 14,400 hemoglobin A1c 5.9.  Patient was transferred to [facility] underwent cerebral angiogram mechanical thrombectomy as well as right carotid stenting and angioplasty 11/30/2023 per Dr. Geralyn Flash.  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  Few nonspecific punctate chronic microhemorrhages scattered elsewhere within the supratentorial brain.  Chronic lacunar infarct within the left thalamus.  Echocardiogram ejection fraction of 50 to 55% no wall motion abnormalities.  Follow-up placed on low-dose aspirin as well as Brilinta 90 mg twice daily for CVA prophylaxis.  Lovenox for DVT prophylaxis.  Hospital course dysphagia currently on dysphagia #2 nectar thick liquid diet with nasogastric tube feeds for nutritional support.  There was question of COPD exacerbation respiratory distress some exertional wheezing placed on  prednisone with taper completing a 5-day course of Unasyn.  He was being weaned from nasal cannula oxygen.  Patient with mild hyponatremia 133 and monitored.  Therapy evaluations completed due to patient decreased functional ability left side weakness was admitted for a comprehensive rehab program.  Admit date: 12/11/2023 Discharge date: 12/31/2023 HPI  Needs help buttoning shirt   Hx of bilateral pain in shoulder  Left shoulder pain worse since CVA   PT, OT through home health , working on walking and Left shoulder  Pain Inventory Average Pain 10 Pain Right Now 0 My pain is intermittent and sharp  In the last 24 hours, has pain interfered with the following? General activity 10 Relation with others 0 Enjoyment of life 10 What TIME of day is your pain at its worst? varies Sleep (in general) Good  Pain is worse with:  liftiing up arm Pain improves with: medication Relief from Meds: 7  Family History  Problem Relation Age of Onset   COPD Mother    Other Brother    Social History   Socioeconomic History   Marital status: Married    Spouse name: Not on file   Number of children: Not on file   Years of education: Not on file   Highest education level: Not on file  Occupational History   Not on file  Tobacco Use   Smoking status: Former    Current packs/day: 0.00    Types: Cigarettes    Quit date: 12/2016    Years since quitting: 7.2   Smokeless tobacco: Never  Vaping Use   Vaping status: Never Used  Substance and Sexual Activity   Alcohol use: Not Currently    Comment: last drank etoh 1 1/2  months ago.   Drug use: No   Sexual activity: Not on file  Other Topics Concern   Not on file  Social History Narrative   Not on file   Social Drivers of Health   Financial Resource Strain: Not on file  Food Insecurity: Not on file  Transportation Needs: Not on file  Physical Activity: Not on file  Stress: Not on file  Social Connections: Not on file   Past Surgical  History:  Procedure Laterality Date   AORTA - BILATERAL FEMORAL ARTERY BYPASS GRAFT Bilateral 01/07/2017   Procedure: AORTOBIFEMORAL BYPASS GRAFT;  Surgeon: Larina Earthly, MD;  Location: [facility] OR;  Service: Vascular;  Laterality: Bilateral;   I & D EXTREMITY Left 11/28/2014   Procedure: IRRIGATION AND DEBRIDEMENT;  REPAIR OF CRUSH INJURY;  Surgeon: Betha Loa, MD;  Location: [facility] OR;  Service: Orthopedics;  Laterality: Left;   INCISIONAL HERNIA REPAIR N/A 10/26/2017   Procedure: HERNIA REPAIR INCISIONAL WITH MESH;  Surgeon: Franky Macho, MD;  Location: [facility] ORS;  Service: General;  Laterality: N/A;   IR CT HEAD LTD  11/30/2023   IR INTRAVSC STENT CERV CAROTID W/EMB-PROT MOD SED INCL ANGIO  11/30/2023   IR PERCUTANEOUS ART THROMBECTOMY/INFUSION INTRACRANIAL INC DIAG ANGIO  11/30/2023   IR US GUIDE VASC ACCESS RIGHT  11/30/2023   RADIOLOGY WITH ANESTHESIA N/A 11/30/2023   Procedure: IR WITH ANESTHESIA;  Surgeon: Radiologist, Medication, MD;  Location: [facility] OR;  Service: Radiology;  Laterality: N/A;   Past Surgical History:  Procedure Laterality Date   AORTA - BILATERAL FEMORAL ARTERY BYPASS GRAFT Bilateral 01/07/2017   Procedure: AORTOBIFEMORAL BYPASS GRAFT;  Surgeon: Larina Earthly, MD;  Location[facility] OR;  Service: Vascular;  Laterality: Bilateral;   I & D EXTREMITY Left 11/28/2014   Procedure: IRRIGATION AND DEBRIDEMENT;  REPAIR OF CRUSH INJURY;  Surgeon: Betha Loa, MD;  Location: [facility] OR;  Service: Orthopedics;  Laterality: Left;   INCISIONAL HERNIA REPAIR N/A 10/26/2017   Procedure: HERNIA REPAIR INCISIONAL WITH MESH;  Surgeon: Franky Macho, MD;  Location: [facility];  Service: General;  Laterality: N/A;   IR CT HEAD LTD  11/30/2023   IR INTRAVSC STENT CERV CAROTID W/EMB-PROT MOD SED INCL ANGIO  11/30/2023   IR PERCUTANEOUS ART THROMBECTOMY/INFUSION INTRACRANIAL INC DIAG ANGIO  11/30/2023   IR US GUIDE VASC ACCESS RIGHT  11/30/2023   RADIOLOGY WITH ANESTHESIA N/A 11/30/2023   Procedure: IR WITH  ANESTHESIA;  Surgeon: Radiologist, Medication, MD;  Location: [facility] OR;  Service: Radiology;  Laterality: N/A;   Past Medical History:  Diagnosis Date   GERD (gastroesophageal reflux disease)    Hypothyroidism    Seizures (HCC)    BP 122/72   Pulse 70   Ht 5\' 11"  (1.803 m)   Wt 188 lb (85.3 kg)   SpO2 93%   BMI 26.22 kg/m   Opioid Risk Score:   Fall Risk Score:  `1  Depression screen PHQ 2/9     01/13/2024    1:32 PM  Depression screen PHQ 2/9  Decreased Interest 0  Down, Depressed, Hopeless 0  PHQ - 2 Score 0  Altered sleeping 0  Tired, decreased energy 0  Change in appetite 0  Feeling bad or failure about yourself  0  Trouble concentrating 0  Moving slowly or fidgety/restless 0  Suicidal thoughts 0  PHQ-9 Score 0  Difficult doing work/chores Not difficult at all     Review of Systems  Musculoskeletal:  Left shoulder pain  Neurological:  Positive for weakness.  All other systems reviewed and are negative.      Objective:   Physical Exam  Positive impingement sign left shoulder there is no pain with external rotation left shoulder Mood and affect are appropriate Speech with mild dysarthria Motor strength is 3 - at the left deltoid bicep tricep grip 4 at the hip flexor knee extensor ankle dorsiflexor Ambulates without assistive device no evidence of drag or instability Sensation reduced to pinprick as well as light touch and temperature in the left upper extremity compared to the right side. There is no evidence of left neglect during functional tasks     Assessment & Plan:  1.  Right MCA infarct with residual left upper extremity greater than lower extremity weakness.  Overall progressing with functional activities.  He is still requiring some assistance for ADLs mainly buttoning buttons.  He ambulates without assistive device We discussed that once he completes home health therapy he may benefit from additional outpatient therapy 2.  Left shoulder pain  likely impingement syndrome, this is impeding his ADLs well to a subacromial injection today Shoulder injection left   Indication: Left shoulder pain not relieved by medication management and other conservative care.  Informed consent was obtained after describing risks and benefits of the procedure with the patient, this includes bleeding, bruising, infection and medication side effects. The patient wishes to proceed and has given written consent. Patient was placed in a seated position. The left shoulder was marked and prepped with betadine in the subacromial area. A 25-gauge 1-1/2 inch needle was inserted into the subacromial area. After negative draw back for blood, a solution containing 1 mL of 6 mg per ML betamethasone and 4 mL of 1% lidocaine was injected. A band aid was applied. The patient tolerated the procedure well. Post procedure instructions were given.

## 2024-02-12 NOTE — Patient Instructions (Addendum)
 Cortisone injection Left shoulder should have maximal effect starting on 2 -3 days and lasting 6-12 wks

## 2024-02-16 ENCOUNTER — Ambulatory Visit (HOSPITAL_COMMUNITY)
Admission: RE | Admit: 2024-02-16 | Discharge: 2024-02-16 | Disposition: A | Discharge status: Home / Self Care | Source: Ambulatory Visit | Attending: Radiology | Admitting: Radiology

## 2024-02-16 DIAGNOSIS — I63511 Cerebral infarction due to unspecified occlusion or stenosis of right middle cerebral artery: Secondary | ICD-10-CM | POA: Diagnosis present

## 2024-02-18 ENCOUNTER — Telehealth: Payer: Self-pay | Admitting: *Deleted

## 2024-02-18 DIAGNOSIS — M7542 Impingement syndrome of left shoulder: Secondary | ICD-10-CM

## 2024-02-18 DIAGNOSIS — I63511 Cerebral infarction due to unspecified occlusion or stenosis of right middle cerebral artery: Secondary | ICD-10-CM

## 2024-02-18 DIAGNOSIS — G8114 Spastic hemiplegia affecting left nondominant side: Secondary | ICD-10-CM

## 2024-02-18 NOTE — Telephone Encounter (Signed)
 [facility] called to report they will be d/c services next week and the pt would like to do outpt therapy in [place]. Order placed for outpt therapy OT/PT in [place].I have let [facility] know they only have PT in [place] so they will have to go to [facility]. She will call back tomorrow with decision after she talks with the wife of Mr Weekes.

## 2024-02-22 ENCOUNTER — Telehealth: Payer: Self-pay | Admitting: Physical Medicine & Rehabilitation

## 2024-02-22 DIAGNOSIS — M7542 Impingement syndrome of left shoulder: Secondary | ICD-10-CM

## 2024-02-22 DIAGNOSIS — G8114 Spastic hemiplegia affecting left nondominant side: Secondary | ICD-10-CM

## 2024-02-22 DIAGNOSIS — I63511 Cerebral infarction due to unspecified occlusion or stenosis of right middle cerebral artery: Secondary | ICD-10-CM

## 2024-02-22 NOTE — Telephone Encounter (Signed)
 Mrs Schoenfeld called because [facility] called her. She is not able to get Mr Ian Wong to [facility].  It is 35- 40 min away and they have a handicapped child in the home as well as him and his stroke, and Mrs Lukes is on a walker due to sciatica pain.  [place] is [address] up [address] from where they live. Can they not go to [place]?

## 2024-02-22 NOTE — Telephone Encounter (Signed)
 [facility] called back and said to just send him to [facility] PT therapy and they would address upper limb issues, but Dr Wynn Banker wants him to go to [facility] outpt therapy due to him having had a stroke. Referrals placed.

## 2024-02-22 NOTE — Telephone Encounter (Signed)
 [facility] left vm and said pts wife said outpatient therapy will be continued in [place] and if you have any questions give her a call

## 2024-02-23 ENCOUNTER — Other Ambulatory Visit: Payer: Self-pay | Admitting: Registered Nurse

## 2024-02-24 ENCOUNTER — Telehealth: Payer: Self-pay | Admitting: Registered Nurse

## 2024-02-24 NOTE — Telephone Encounter (Signed)
 Call placed to Ian Wong, she reports Ian Wong PCP prescribed two months of his Brilinta today.  He was seen at [facility] on 02/04/2024, note was reviewed.  She has called Intervention Radiologist  today,as instructed by her PCP, and spoke with staff, they will be calling her back after they speak with the intervention radiologist.  She was instructed to F/U if she doesn't received a call in a week, she verbalizes understanding. He has a scheduled appointment with Dr Wynn Banker in May, 2025.  She verbalizes understanding.

## 2024-02-25 ENCOUNTER — Telehealth (HOSPITAL_COMMUNITY): Payer: Self-pay

## 2024-02-25 NOTE — Telephone Encounter (Signed)
-----   Message from Villa Herb sent at 02/25/2024  1:34 PM EDT ----- Regarding: Follow up Hi,  Dr. Corliss Skains has reviewed this patient's imaging and recommends 6 month carotid US.  He also had a question about coming off Brilinta, he was originally a patient of [facility]'s and has had no follow up with [place] since procedure in December 2024. As such he will need to see Dr. Corliss Skains for follow up to discuss the Brilinta. There's an IR rad eval order in his chart that was placed 12/01/23, let me know if you need a new order.   Thanks, Carollee Herter

## 2024-03-02 ENCOUNTER — Ambulatory Visit: Attending: Physical Medicine & Rehabilitation

## 2024-03-02 DIAGNOSIS — M6281 Muscle weakness (generalized): Secondary | ICD-10-CM | POA: Insufficient documentation

## 2024-03-02 DIAGNOSIS — M7542 Impingement syndrome of left shoulder: Secondary | ICD-10-CM | POA: Insufficient documentation

## 2024-03-02 DIAGNOSIS — G8114 Spastic hemiplegia affecting left nondominant side: Secondary | ICD-10-CM | POA: Diagnosis not present

## 2024-03-02 DIAGNOSIS — I63511 Cerebral infarction due to unspecified occlusion or stenosis of right middle cerebral artery: Secondary | ICD-10-CM | POA: Diagnosis present

## 2024-03-02 NOTE — Therapy (Signed)
 OUTPATIENT PHYSICAL THERAPY NEURO EVALUATION   Patient Name: Ian Wong MRN: [id number] DOB:June 17, 1961, 63 y.o., male Today's Date: 03/02/2024  REFERRING PROVIDER: Erick Colace, MD   END OF SESSION:  PT End of Session - 03/02/24 1346     Visit Number 1    Number of Visits 12    Date for PT Re-Evaluation 05/27/24    PT Start Time 1347    PT Stop Time 1420    PT Time Calculation (min) 33 min    Activity Tolerance Patient tolerated treatment well    Behavior During Therapy [facility] for tasks assessed/performed             Past Medical History:  Diagnosis Date   GERD (gastroesophageal reflux disease)    Hypothyroidism    Seizures (HCC)    Past Surgical History:  Procedure Laterality Date   AORTA - BILATERAL FEMORAL ARTERY BYPASS GRAFT Bilateral 01/07/2017   Procedure: AORTOBIFEMORAL BYPASS GRAFT;  Surgeon: Larina Earthly, MD;  Location: [facility] OR;  Service: Vascular;  Laterality: Bilateral;   I & D EXTREMITY Left 11/28/2014   Procedure: IRRIGATION AND DEBRIDEMENT;  REPAIR OF CRUSH INJURY;  Surgeon: Betha Loa, MD;  Location: [facility] OR;  Service: Orthopedics;  Laterality: Left;   INCISIONAL HERNIA REPAIR N/A 10/26/2017   Procedure: HERNIA REPAIR INCISIONAL WITH MESH;  Surgeon: Franky Macho, MD;  Location: [facility] ORS;  Service: General;  Laterality: N/A;   IR CT HEAD LTD  11/30/2023   IR INTRAVSC STENT CERV CAROTID W/EMB-PROT MOD SED INCL ANGIO  11/30/2023   IR PERCUTANEOUS ART THROMBECTOMY/INFUSION INTRACRANIAL INC DIAG ANGIO  11/30/2023   IR US GUIDE VASC ACCESS RIGHT  11/30/2023   RADIOLOGY WITH ANESTHESIA N/A 11/30/2023   Procedure: IR WITH ANESTHESIA;  Surgeon: Radiologist, Medication, MD;  Location: [facility] OR;  Service: Radiology;  Laterality: N/A;   Patient Active Problem List   Diagnosis Date Noted   Dysphagia, post-stroke 12/16/2023   Spastic hemiparesis 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 (HCC) 12/16/2023   Right middle cerebral artery stroke  (HCC) 12/11/2023   Acute ischemic right MCA stroke (HCC) 11/30/2023   Acute right MCA stroke (HCC) 11/30/2023   Acute hypoxic respiratory failure (HCC) 11/30/2023   Acute metabolic encephalopathy 11/30/2023   Aspiration pneumonia (HCC) 11/30/2023   Incisional hernia, without obstruction or gangrene    Aortoiliac occlusive disease (HCC) 01/07/2017   Leriche syndrome (HCC)    PAD (peripheral artery disease) (HCC) 01/03/2017   Critical lower limb ischemia (HCC) 01/03/2017   Cellulitis 12/29/2016   Cellulitis of left lower extremity     ONSET DATE: November 30, 2023  REFERRING DIAG: Right middle cerebral artery stroke, Left spastic hemiparesis, Shoulder impingement syndrome, left   THERAPY DIAG:  Right middle cerebral artery stroke (HCC)  Muscle weakness (generalized)  Rationale for Evaluation and Treatment: Rehabilitation  SUBJECTIVE:  SUBJECTIVE STATEMENT: Patient reports that he had a stroke on 11/30/23. He feels that this has primarily effected his left shoulder and arm. 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 He is right hand dominant. He has fallen once when he tripped on his rug at home on 01/06/24. He was transported to [facility], but he was cleared and was not admitted. He requires assistance from his wife to help him get dressed.  Pt accompanied by: self  PERTINENT HISTORY: peripheral artery disease, history of a CVA, and history of a seizure  PAIN:  Are you having pain? Yes: NPRS scale: Current: 0/10 Worst: 10/10 Pain location: left shoulder Pain description: intermittent, "hurts" Aggravating factors: raising his arm  Relieving factors: rest and medication  PRECAUTIONS: Fall  RED FLAGS: None   WEIGHT BEARING  RESTRICTIONS: No  FALLS: Has patient fallen in last 6 months? Yes. Number of falls 1  LIVING ENVIRONMENT: Lives with: lives with their family Lives in: House/apartment Stairs:  Yes, but he primarily uses a ramp to enter and exit his home Has following equipment at home: shower chair, Grab bars, and Ramped entry  PLOF: Independent  PATIENT GOALS: improved arm mobility  OBJECTIVE:  Note: Objective measures were completed at Evaluation unless otherwise noted.  DIAGNOSTIC FINDINGS: 01/06/24 IMPRESSION: 1. No acute traumatic injury identified. Chronic left lamina papyracea fracture. 2. Expected evolution of large Right MCA territory infarct since December with developing encephalomalacia. No acute intracranial abnormality. COGNITION: Overall cognitive status: Within functional limits for tasks assessed   SENSATION: Patient reports numbness in his left hand since his stroke in December 2024  COORDINATION: No significant deficits observed  UPPER EXTREMITY ROM:  Active ROM Right eval Left eval  Shoulder flexion 142 72; painful  Shoulder extension    Shoulder abduction 120 70; painful   Shoulder adduction    Shoulder extension    Shoulder internal rotation To L2 To sacrum  Shoulder external rotation To T3 To left ear  Elbow flexion    Elbow extension    Wrist flexion    Wrist extension    Wrist ulnar deviation    Wrist radial deviation    Wrist pronation    Wrist supination     (Blank rows = not tested)   UPPER EXTREMITY MMT:  MMT Right eval Left eval  Shoulder flexion 4+/5   Shoulder extension    Shoulder abduction 5/5   Shoulder adduction    Shoulder extension    Shoulder internal rotation 4/5   Shoulder external rotation 4+/5   Middle trapezius    Lower trapezius    Elbow flexion    Elbow extension    Wrist flexion    Wrist extension    Wrist ulnar deviation    Wrist radial deviation    Wrist pronation    Wrist supination    Grip strength 97 15    (Blank rows = not tested)   LOWER EXTREMITY MMT:    MMT Right Eval Left Eval  Hip flexion 4/5 3+/5  Hip extension    Hip abduction    Hip adduction    Hip internal rotation    Hip external rotation    Knee flexion 5/5 4+/5  Knee extension 5/5 4+/5  Ankle dorsiflexion 4+/5 4/5  Ankle plantarflexion    Ankle inversion    Ankle eversion    (Blank rows = not tested)  TRANSFERS: Assistive device utilized: None  Sit to stand: Complete Independence Stand to sit: Complete Independence  GAIT: Gait pattern: step to pattern and shuffling Assistive device utilized: None Level of assistance: SBA  FUNCTIONAL TESTS:  5 times sit to stand: 13.88 seconds without UE support                                                                                                                 TREATMENT DATE:     PATIENT EDUCATION: Education details: POC, healing, objective findings, benefits of therapy, and goals for physical therapy  Person educated: Patient Education method: Explanation Education comprehension: verbalized understanding  HOME EXERCISE PROGRAM:   GOALS: Goals reviewed with patient? Yes  SHORT TERM GOALS: Target date: 03/23/24  Patient will be independent with his initial HEP.  Baseline: Goal status: INITIAL  2.  Patient will be able to demonstrate at least 90 degrees of left shoulder flexion for improved function picking up items from his cabinet.  Baseline:  Goal status: INITIAL  3.  Patient will be able to demonstrate at least 90 degrees of left shoulder abduction for improved function reaching.  Baseline:  Goal status: INITIAL  4.  Patient will improve his left hand grip strength to at least 25 pounds for improved function grasping items.  Baseline:  Goal status: INITIAL  LONG TERM GOALS: Target date: 04/13/24  Patient will be independent with his advanced HEP.  Baseline:  Goal status: INITIAL  2.  Patient will improve his five time sit to stand time to  12 seconds or less to reduce his fall risk.  Baseline:  Goal status: INITIAL  3.  Patient will be able to demonstrate at least 120 degrees of left shoulder flexion for improved function washing his hair.  Baseline:  Goal status: INITIAL  4.  Patient will be able to demonstrate at least 120 degrees of left shoulder abduction for improved function reaching overhead.  Baseline:  Goal status: INITIAL  5.  Patient will improve his left hand grip strength to at least 40 pounds for improved function carrying groceries.  Baseline:  Goal status: INITIAL  ASSESSMENT:  CLINICAL IMPRESSION: Patient is a 63 y.o. male who was seen today for physical therapy evaluation and treatment for left sided weakness secondary to a right middle cerebral artery stroke on 11/30/23. He is at an elevated fall risk as evidenced by his objective measures and his history of falling. He also exhibited reduced left upper extremity active range of motion and muscular strength compared to his right upper extremity. Recommend that he continue with skilled physical therapy to address his impairments to maximize his functional mobility.  OBJECTIVE IMPAIRMENTS: Abnormal gait, decreased activity tolerance, difficulty walking, decreased ROM, decreased strength, impaired sensation, impaired UE functional use, and pain.   ACTIVITY LIMITATIONS: carrying, lifting, reach over head, and locomotion level  PARTICIPATION LIMITATIONS: meal prep, cleaning, laundry, shopping, community activity, and yard work  PERSONAL FACTORS: Past/current experiences, Time since onset of injury/illness/exacerbation, Transportation, and 3+ comorbidities: peripheral artery disease, history of a CVA, and  history of a seizure  are also affecting patient's functional outcome.   REHAB POTENTIAL: Good  CLINICAL DECISION MAKING: Evolving/moderate complexity  EVALUATION COMPLEXITY: Moderate  PLAN:  PT FREQUENCY: 2x/week  PT DURATION: 6 weeks  PLANNED  INTERVENTIONS: 97164- PT Re-evaluation, 97110-Therapeutic exercises, 97530- Therapeutic activity, [id number]- Neuromuscular re-education, 97535- Self Care, 84132- Manual therapy, [phone]- Gait training, [phone]- Electrical stimulation (unattended), Patient/Family education, Balance training, Cryotherapy, and Moist heat  PLAN FOR NEXT SESSION: Nustep, upper and lower extremity strength, balance interventions, pulleys, AAROM, and gait training   Granville Lewis, PT 03/02/2024, 3:58 PM

## 2024-03-08 ENCOUNTER — Ambulatory Visit

## 2024-03-08 DIAGNOSIS — I63511 Cerebral infarction due to unspecified occlusion or stenosis of right middle cerebral artery: Secondary | ICD-10-CM

## 2024-03-08 DIAGNOSIS — M6281 Muscle weakness (generalized): Secondary | ICD-10-CM

## 2024-03-08 NOTE — Therapy (Signed)
 OUTPATIENT PHYSICAL THERAPY NEURO TREATMENT   Patient Name: Ian Wong MRN: [id number] DOB:Jan 22, 1961, 63 y.o., male Today's Date: 03/08/2024  REFERRING PROVIDER: Erick Colace, MD   END OF SESSION:  PT End of Session - 03/08/24 1350     Visit Number 2    Number of Visits 12    Date for PT Re-Evaluation 05/27/24    PT Start Time 1345    PT Stop Time 1430    PT Time Calculation (min) 45 min    Activity Tolerance Patient tolerated treatment well    Behavior During Therapy [facility] for tasks assessed/performed             Past Medical History:  Diagnosis Date   GERD (gastroesophageal reflux disease)    Hypothyroidism    Seizures (HCC)    Past Surgical History:  Procedure Laterality Date   AORTA - BILATERAL FEMORAL ARTERY BYPASS GRAFT Bilateral 01/07/2017   Procedure: AORTOBIFEMORAL BYPASS GRAFT;  Surgeon: Larina Earthly, MD;  Location: [facility] OR;  Service: Vascular;  Laterality: Bilateral;   I & D EXTREMITY Left 11/28/2014   Procedure: IRRIGATION AND DEBRIDEMENT;  REPAIR OF CRUSH INJURY;  Surgeon: Betha Loa, MD;  Location: [facility] OR;  Service: Orthopedics;  Laterality: Left;   INCISIONAL HERNIA REPAIR N/A 10/26/2017   Procedure: HERNIA REPAIR INCISIONAL WITH MESH;  Surgeon: Franky Macho, MD;  Location: [facility] ORS;  Service: General;  Laterality: N/A;   IR CT HEAD LTD  11/30/2023   IR INTRAVSC STENT CERV CAROTID W/EMB-PROT MOD SED INCL ANGIO  11/30/2023   IR PERCUTANEOUS ART THROMBECTOMY/INFUSION INTRACRANIAL INC DIAG ANGIO  11/30/2023   IR US GUIDE VASC ACCESS RIGHT  11/30/2023   RADIOLOGY WITH ANESTHESIA N/A 11/30/2023   Procedure: IR WITH ANESTHESIA;  Surgeon: Radiologist, Medication, MD;  Location: [facility] OR;  Service: Radiology;  Laterality: N/A;   Patient Active Problem List   Diagnosis Date Noted   Dysphagia, post-stroke 12/16/2023   Spastic hemiparesis 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 (HCC) 12/16/2023   Right middle cerebral artery stroke (HCC)  12/11/2023   Acute ischemic right MCA stroke (HCC) 11/30/2023   Acute right MCA stroke (HCC) 11/30/2023   Acute hypoxic respiratory failure (HCC) 11/30/2023   Acute metabolic encephalopathy 11/30/2023   Aspiration pneumonia (HCC) 11/30/2023   Incisional hernia, without obstruction or gangrene    Aortoiliac occlusive disease (HCC) 01/07/2017   Leriche syndrome (HCC)    PAD (peripheral artery disease) (HCC) 01/03/2017   Critical lower limb ischemia (HCC) 01/03/2017   Cellulitis 12/29/2016   Cellulitis of left lower extremity     ONSET DATE: November 30, 2023  REFERRING DIAG: Right middle cerebral artery stroke, Left spastic hemiparesis, Shoulder impingement syndrome, left   THERAPY DIAG:  Right middle cerebral artery stroke (HCC)  Muscle weakness (generalized)  Rationale for Evaluation and Treatment: Rehabilitation  SUBJECTIVE:  SUBJECTIVE STATEMENT: Pt reports 3/10 left shoulder pain today.    Pt accompanied by: self  PERTINENT HISTORY: peripheral artery disease, history of a CVA, and history of a seizure  PAIN:  Are you having pain? Yes: NPRS scale: 3/10 Pain location: left shoulder Pain description: intermittent, "hurts" Aggravating factors: raising his arm  Relieving factors: rest and medication  PRECAUTIONS: Fall  RED FLAGS: None   WEIGHT BEARING RESTRICTIONS: No  FALLS: Has patient fallen in last 6 months? Yes. Number of falls 1  LIVING ENVIRONMENT: Lives with: lives with their family Lives in: House/apartment Stairs:  Yes, but he primarily uses a ramp to enter and exit his home Has following equipment at home: shower chair, Grab bars, and Ramped entry  PLOF: Independent  PATIENT GOALS: improved arm mobility  OBJECTIVE:  Note: Objective measures were completed at  Evaluation unless otherwise noted.  DIAGNOSTIC FINDINGS: 01/06/24 IMPRESSION: 1. No acute traumatic injury identified. Chronic left lamina papyracea fracture. 2. Expected evolution of large Right MCA territory infarct since December with developing encephalomalacia. No acute intracranial abnormality. COGNITION: Overall cognitive status: Within functional limits for tasks assessed   SENSATION: Patient reports numbness in his left hand since his stroke in December 2024  COORDINATION: No significant deficits observed  UPPER EXTREMITY ROM:  Active ROM Right eval Left eval  Shoulder flexion 142 72; painful  Shoulder extension    Shoulder abduction 120 70; painful   Shoulder adduction    Shoulder extension    Shoulder internal rotation To L2 To sacrum  Shoulder external rotation To T3 To left ear  Elbow flexion    Elbow extension    Wrist flexion    Wrist extension    Wrist ulnar deviation    Wrist radial deviation    Wrist pronation    Wrist supination     (Blank rows = not tested)   UPPER EXTREMITY MMT:  MMT Right eval Left eval  Shoulder flexion 4+/5   Shoulder extension    Shoulder abduction 5/5   Shoulder adduction    Shoulder extension    Shoulder internal rotation 4/5   Shoulder external rotation 4+/5   Middle trapezius    Lower trapezius    Elbow flexion    Elbow extension    Wrist flexion    Wrist extension    Wrist ulnar deviation    Wrist radial deviation    Wrist pronation    Wrist supination    Grip strength 97 15   (Blank rows = not tested)   LOWER EXTREMITY MMT:    MMT Right Eval Left Eval  Hip flexion 4/5 3+/5  Hip extension    Hip abduction    Hip adduction    Hip internal rotation    Hip external rotation    Knee flexion 5/5 4+/5  Knee extension 5/5 4+/5  Ankle dorsiflexion 4+/5 4/5  Ankle plantarflexion    Ankle inversion    Ankle eversion    (Blank rows = not tested)  TRANSFERS: Assistive device utilized: None  Sit to  stand: Complete Independence Stand to sit: Complete Independence  GAIT: Gait pattern: step to pattern and shuffling Assistive device utilized: None Level of assistance: SBA  FUNCTIONAL TESTS:  5 times sit to stand: 13.88 seconds without UE support  TREATMENT DATE:      03/08/24                                EXERCISE LOG  Exercise Repetitions and Resistance Comments  Nustep  Lvl 3 x 15 mins   UBE 120 RPM x 8 mins (forward/backward)   Pulleys 4 mins   AA Flexion Cane x 15 reps   AA Chest Press Cane x 15 reps   Bicep Curls 3# x 15 reps 2-way   LAQs 5# x 20 reps bil   Seated Marches  5# x 20 reps bil   Seated ham Curls Green x 20 reps bil    Blank cell = exercise not performed today   PATIENT EDUCATION: Education details: POC, healing, objective findings, benefits of therapy, and goals for physical therapy  Person educated: Patient Education method: Explanation Education comprehension: verbalized understanding  HOME EXERCISE PROGRAM:   GOALS: Goals reviewed with patient? Yes  SHORT TERM GOALS: Target date: 03/23/24  Patient will be independent with his initial HEP.  Baseline: Goal status: INITIAL  2.  Patient will be able to demonstrate at least 90 degrees of left shoulder flexion for improved function picking up items from his cabinet.  Baseline:  Goal status: INITIAL  3.  Patient will be able to demonstrate at least 90 degrees of left shoulder abduction for improved function reaching.  Baseline:  Goal status: INITIAL  4.  Patient will improve his left hand grip strength to at least 25 pounds for improved function grasping items.  Baseline:  Goal status: INITIAL  LONG TERM GOALS: Target date: 04/13/24  Patient will be independent with his advanced HEP.  Baseline:  Goal status: INITIAL  2.  Patient will improve his five time sit to stand time to 12  seconds or less to reduce his fall risk.  Baseline:  Goal status: INITIAL  3.  Patient will be able to demonstrate at least 120 degrees of left shoulder flexion for improved function washing his hair.  Baseline:  Goal status: INITIAL  4.  Patient will be able to demonstrate at least 120 degrees of left shoulder abduction for improved function reaching overhead.  Baseline:  Goal status: INITIAL  5.  Patient will improve his left hand grip strength to at least 40 pounds for improved function carrying groceries.  Baseline:  Goal status: INITIAL  ASSESSMENT:  CLINICAL IMPRESSION:  Pt arrives for today's treatment session reporting 3/10 left shoulder pain.  Pt introduced to Nustep and UBE for warm up today with minimal discomfort.  Pt does demonstrate difficulty maintaining grip with left hand on UBE.  PT instructed in various UE and LE exercises today to increased function, strength, and ROM while reducing pain.  Pt requiring min cues for proper technique with all newly added exercises.  Pt reported decreased left shoulder pain at completion of today's treatment session.  OBJECTIVE IMPAIRMENTS: Abnormal gait, decreased activity tolerance, difficulty walking, decreased ROM, decreased strength, impaired sensation, impaired UE functional use, and pain.   ACTIVITY LIMITATIONS: carrying, lifting, reach over head, and locomotion level  PARTICIPATION LIMITATIONS: meal prep, cleaning, laundry, shopping, community activity, and yard work  PERSONAL FACTORS: Past/current experiences, Time since onset of injury/illness/exacerbation, Transportation, and 3+ comorbidities: peripheral artery disease, history of a CVA, and history of a seizure  are also affecting patient's functional outcome.   REHAB POTENTIAL: Good  CLINICAL DECISION MAKING: Evolving/moderate complexity  EVALUATION COMPLEXITY: Moderate  PLAN:  PT FREQUENCY: 2x/week  PT DURATION: 6 weeks  PLANNED INTERVENTIONS: 09811- PT  Re-evaluation, 97110-Therapeutic exercises, 97530- Therapeutic activity, 97112- Neuromuscular re-education, 97535- Self Care, 91478- Manual therapy, [phone]- Gait training, [phone]- Electrical stimulation (unattended), Patient/Family education, Balance training, Cryotherapy, and Moist heat  PLAN FOR NEXT SESSION: Nustep, upper and lower extremity strength, balance interventions, pulleys, AAROM, and gait training   Newman Pies, PTA 03/08/2024, 4:16 PM

## 2024-03-10 ENCOUNTER — Ambulatory Visit (HOSPITAL_COMMUNITY)

## 2024-03-11 ENCOUNTER — Encounter

## 2024-03-15 ENCOUNTER — Telehealth: Payer: Self-pay

## 2024-03-15 NOTE — Patient Outreach (Signed)
 Telephone outreach to patient's wife to obtain mRS was successfully completed. MRS= 3  Vanice Sarah [facility] Health Care Management Assistant  Direct Dial: [phone]  Fax: [phone] Website: [email or web address]

## 2024-03-17 ENCOUNTER — Ambulatory Visit

## 2024-03-17 DIAGNOSIS — I63511 Cerebral infarction due to unspecified occlusion or stenosis of right middle cerebral artery: Secondary | ICD-10-CM | POA: Diagnosis not present

## 2024-03-17 DIAGNOSIS — M6281 Muscle weakness (generalized): Secondary | ICD-10-CM

## 2024-03-17 NOTE — Therapy (Signed)
 OUTPATIENT PHYSICAL THERAPY NEURO TREATMENT   Patient Name: Ian Wong MRN: [id number] DOB:24-Sep-1961, 63 y.o., male Today's Date: 03/17/2024  REFERRING PROVIDER: Genetta Kenning, MD   END OF SESSION:  PT End of Session - 03/17/24 1106     Visit Number 3    Number of Visits 12    Date for PT Re-Evaluation 05/27/24    PT Start Time 1100    PT Stop Time 1150    PT Time Calculation (min) 50 min    Activity Tolerance No increased pain;Patient limited by pain    Behavior During Therapy [facility] for tasks assessed/performed             Past Medical History:  Diagnosis Date   GERD (gastroesophageal reflux disease)    Hypothyroidism    Seizures (HCC)    Past Surgical History:  Procedure Laterality Date   AORTA - BILATERAL FEMORAL ARTERY BYPASS GRAFT Bilateral 01/07/2017   Procedure: AORTOBIFEMORAL BYPASS GRAFT;  Surgeon: Mayo Speck, MD;  Location: [facility] OR;  Service: Vascular;  Laterality: Bilateral;   I & D EXTREMITY Left 11/28/2014   Procedure: IRRIGATION AND DEBRIDEMENT;  REPAIR OF CRUSH INJURY;  Surgeon: Brunilda Capra, MD;  Location: [facility] OR;  Service: Orthopedics;  Laterality: Left;   INCISIONAL HERNIA REPAIR N/A 10/26/2017   Procedure: HERNIA REPAIR INCISIONAL WITH MESH;  Surgeon: Alanda Allegra, MD;  Location: [facility] ORS;  Service: General;  Laterality: N/A;   IR CT HEAD LTD  11/30/2023   IR INTRAVSC STENT CERV CAROTID W/EMB-PROT MOD SED INCL ANGIO  11/30/2023   IR PERCUTANEOUS ART THROMBECTOMY/INFUSION INTRACRANIAL INC DIAG ANGIO  11/30/2023   IR US  GUIDE VASC ACCESS RIGHT  11/30/2023   RADIOLOGY WITH ANESTHESIA N/A 11/30/2023   Procedure: IR WITH ANESTHESIA;  Surgeon: Radiologist, Medication, MD;  Location: [facility] OR;  Service: Radiology;  Laterality: N/A;   Patient Active Problem List   Diagnosis Date Noted   Dysphagia, post-stroke 12/16/2023   Spastic hemiparesis 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 (HCC) 12/16/2023   Right middle cerebral artery  stroke (HCC) 12/11/2023   Acute ischemic right MCA stroke (HCC) 11/30/2023   Acute right MCA stroke (HCC) 11/30/2023   Acute hypoxic respiratory failure (HCC) 11/30/2023   Acute metabolic encephalopathy 11/30/2023   Aspiration pneumonia (HCC) 11/30/2023   Incisional hernia, without obstruction or gangrene    Aortoiliac occlusive disease (HCC) 01/07/2017   Leriche syndrome (HCC)    PAD (peripheral artery disease) (HCC) 01/03/2017   Critical lower limb ischemia (HCC) 01/03/2017   Cellulitis 12/29/2016   Cellulitis of left lower extremity     ONSET DATE: November 30, 2023  REFERRING DIAG: Right middle cerebral artery stroke, Left spastic hemiparesis, Shoulder impingement syndrome, left   THERAPY DIAG:  Right middle cerebral artery stroke (HCC)  Muscle weakness (generalized)  Rationale for Evaluation and Treatment: Rehabilitation  SUBJECTIVE:  SUBJECTIVE STATEMENT:  Patient stated his left shoulder is hurting today and reported 6/10 pain at the start of treatment.   Pt accompanied by: self  PERTINENT HISTORY: peripheral artery disease, history of a CVA, and history of a seizure  PAIN:  Are you having pain? Yes: NPRS scale: 6/10 Pain location: left shoulder Pain description: intermittent, "hurts" Aggravating factors: raising his arm  Relieving factors: rest and medication  PRECAUTIONS: Fall  RED FLAGS: None   WEIGHT BEARING RESTRICTIONS: No  FALLS: Has patient fallen in last 6 months? Yes. Number of falls 1  LIVING ENVIRONMENT: Lives with: lives with their family Lives in: House/apartment Stairs:  Yes, but he primarily uses a ramp to enter and exit his home Has following equipment at home: shower chair, Grab bars, and Ramped entry  PLOF: Independent  PATIENT GOALS: improved arm  mobility  OBJECTIVE:  Note: Objective measures were completed at Evaluation unless otherwise noted.  DIAGNOSTIC FINDINGS: 01/06/24 IMPRESSION: 1. No acute traumatic injury identified. Chronic left lamina papyracea fracture. 2. Expected evolution of large Right MCA territory infarct since December with developing encephalomalacia. No acute intracranial abnormality. COGNITION: Overall cognitive status: Within functional limits for tasks assessed   SENSATION: Patient reports numbness in his left hand since his stroke in December 2024  COORDINATION: No significant deficits observed  UPPER EXTREMITY ROM:  Active ROM Right eval Left eval  Shoulder flexion 142 72; painful  Shoulder extension    Shoulder abduction 120 70; painful   Shoulder adduction    Shoulder extension    Shoulder internal rotation To L2 To sacrum  Shoulder external rotation To T3 To left ear  Elbow flexion    Elbow extension    Wrist flexion    Wrist extension    Wrist ulnar deviation    Wrist radial deviation    Wrist pronation    Wrist supination     (Blank rows = not tested)   UPPER EXTREMITY MMT:  MMT Right eval Left eval  Shoulder flexion 4+/5   Shoulder extension    Shoulder abduction 5/5   Shoulder adduction    Shoulder extension    Shoulder internal rotation 4/5   Shoulder external rotation 4+/5   Middle trapezius    Lower trapezius    Elbow flexion    Elbow extension    Wrist flexion    Wrist extension    Wrist ulnar deviation    Wrist radial deviation    Wrist pronation    Wrist supination    Grip strength 97 15   (Blank rows = not tested)   LOWER EXTREMITY MMT:    MMT Right Eval Left Eval  Hip flexion 4/5 3+/5  Hip extension    Hip abduction    Hip adduction    Hip internal rotation    Hip external rotation    Knee flexion 5/5 4+/5  Knee extension 5/5 4+/5  Ankle dorsiflexion 4+/5 4/5  Ankle plantarflexion    Ankle inversion    Ankle eversion    (Blank rows =  not tested)  TRANSFERS: Assistive device utilized: None  Sit to stand: Complete Independence Stand to sit: Complete Independence  GAIT: Gait pattern: step to pattern and shuffling Assistive device utilized: None Level of assistance: SBA  FUNCTIONAL TESTS:  5 times sit to stand: 13.88 seconds without UE support  TREATMENT DATE:                                       03/17/24 EXERCISE LOG   Exercise Repetitions and Resistance Comments  Nustep  Level 4 x 15 minutes  TE  UBE  120 RPM x 8 minutes  TE  Standing Heel Raises   3 sets of 12 reps  TA  Sit to stands   3 sets of 12 reps  TA  Heel toe walking   2 sets of 1 minute  NR  Resisted Row   3 sets of 12 reps red band  NR  SH ER walkouts   2 sets of 8 reps yellow bands  3 steps out and in NR       Blank cell = exercise not performed today     03/08/24                                EXERCISE LOG  Exercise Repetitions and Resistance Comments  Nustep  Lvl 3 x 15 mins   UBE 120 RPM x 8 mins (forward/backward)   Pulleys 4 mins   AA Flexion Cane x 15 reps   AA Chest Press Cane x 15 reps   Bicep Curls 3# x 15 reps 2-way   LAQs 5# x 20 reps bil   Seated Marches  5# x 20 reps bil   Seated ham Curls Green x 20 reps bil    Blank cell = exercise not performed today   PATIENT EDUCATION: Education details: POC, healing, objective findings, benefits of therapy, and goals for physical therapy  Person educated: Patient Education method: Explanation Education comprehension: verbalized understanding  HOME EXERCISE PROGRAM: [id number]  GOALS: Goals reviewed with patient? Yes  SHORT TERM GOALS: Target date: 03/23/24  Patient will be independent with his initial HEP.  Baseline: Goal status: INITIAL  2.  Patient will be able to demonstrate at least 90 degrees of left shoulder flexion for improved function picking up items from  his cabinet.  Baseline:  Goal status: INITIAL  3.  Patient will be able to demonstrate at least 90 degrees of left shoulder abduction for improved function reaching.  Baseline:  Goal status: INITIAL  4.  Patient will improve his left hand grip strength to at least 25 pounds for improved function grasping items.  Baseline:  Goal status: INITIAL  LONG TERM GOALS: Target date: 04/13/24  Patient will be independent with his advanced HEP.  Baseline:  Goal status: INITIAL  2.  Patient will improve his five time sit to stand time to 12 seconds or less to reduce his fall risk.  Baseline:  Goal status: INITIAL  3.  Patient will be able to demonstrate at least 120 degrees of left shoulder flexion for improved function washing his hair.  Baseline:  Goal status: INITIAL  4.  Patient will be able to demonstrate at least 120 degrees of left shoulder abduction for improved function reaching overhead.  Baseline:  Goal status: INITIAL  5.  Patient will improve his left hand grip strength to at least 40 pounds for improved function carrying groceries.  Baseline:  Goal status: INITIAL  ASSESSMENT:  CLINICAL IMPRESSION:   Patient arrived for today's treatment session reporting 6/10 left shoulder pain. He had some pain and difficulty with left hand gripping in today's  treatment. He needed verbal and tactile cueing for ER walkouts, resisted rows, heel/toe walking, heel raises to ensure proper mechanics and muscle engagement. He was provided an HEP and demonstrated and verbalized compliance. He reported increased mobility and 4/10 pain at the end of treatment session. Patient will continue to benefit from skilled physical therapy to address impairments and reduce pain while improving ability to perform ADL's.   OBJECTIVE IMPAIRMENTS: Abnormal gait, decreased activity tolerance, difficulty walking, decreased ROM, decreased strength, impaired sensation, impaired UE functional use, and pain.    ACTIVITY LIMITATIONS: carrying, lifting, reach over head, and locomotion level  PARTICIPATION LIMITATIONS: meal prep, cleaning, laundry, shopping, community activity, and yard work  PERSONAL FACTORS: Past/current experiences, Time since onset of injury/illness/exacerbation, Transportation, and 3+ comorbidities: peripheral artery disease, history of a CVA, and history of a seizure  are also affecting patient's functional outcome.   REHAB POTENTIAL: Good  CLINICAL DECISION MAKING: Evolving/moderate complexity  EVALUATION COMPLEXITY: Moderate  PLAN:  PT FREQUENCY: 2x/week  PT DURATION: 6 weeks  PLANNED INTERVENTIONS: 97164- PT Re-evaluation, 97110-Therapeutic exercises, 97530- Therapeutic activity, [id number]- Neuromuscular re-education, 97535- Self Care, 82956- Manual therapy, [id number]- Gait training, [phone]- Electrical stimulation (unattended), Patient/Family education, Balance training, Cryotherapy, and Moist heat  PLAN FOR NEXT SESSION:  Nustep, UBE, continue upper and lower extremity strength, continue balance interventions, pulleys, AAROM, and gait training.    Deryl Flora, PTA 03/17/2024, 12:04 PM

## 2024-03-23 ENCOUNTER — Ambulatory Visit

## 2024-03-23 ENCOUNTER — Other Ambulatory Visit (HOSPITAL_COMMUNITY): Payer: Self-pay

## 2024-03-23 DIAGNOSIS — M6281 Muscle weakness (generalized): Secondary | ICD-10-CM

## 2024-03-23 DIAGNOSIS — I63511 Cerebral infarction due to unspecified occlusion or stenosis of right middle cerebral artery: Secondary | ICD-10-CM

## 2024-03-23 NOTE — Therapy (Signed)
 OUTPATIENT PHYSICAL THERAPY NEURO TREATMENT   Patient Name: Ian Wong MRN: [id number] DOB:Apr 30, 1961, 63 y.o., male Today's Date: 03/23/2024  REFERRING PROVIDER: Genetta Kenning, MD   END OF SESSION:  PT End of Session - 03/23/24 1438     Visit Number 4    Number of Visits 12    Date for PT Re-Evaluation 05/27/24    PT Start Time 1430    PT Stop Time 1515    PT Time Calculation (min) 45 min    Activity Tolerance No increased pain;Patient limited by pain    Behavior During Therapy [facility] for tasks assessed/performed             Past Medical History:  Diagnosis Date   GERD (gastroesophageal reflux disease)    Hypothyroidism    Seizures (HCC)    Past Surgical History:  Procedure Laterality Date   AORTA - BILATERAL FEMORAL ARTERY BYPASS GRAFT Bilateral 01/07/2017   Procedure: AORTOBIFEMORAL BYPASS GRAFT;  Surgeon: Mayo Speck, MD;  Location: [facility] OR;  Service: Vascular;  Laterality: Bilateral;   I & D EXTREMITY Left 11/28/2014   Procedure: IRRIGATION AND DEBRIDEMENT;  REPAIR OF CRUSH INJURY;  Surgeon: Brunilda Capra, MD;  Location: [facility] OR;  Service: Orthopedics;  Laterality: Left;   INCISIONAL HERNIA REPAIR N/A 10/26/2017   Procedure: HERNIA REPAIR INCISIONAL WITH MESH;  Surgeon: Alanda Allegra, MD;  Location: [facility] ORS;  Service: General;  Laterality: N/A;   IR CT HEAD LTD  11/30/2023   IR INTRAVSC STENT CERV CAROTID W/EMB-PROT MOD SED INCL ANGIO  11/30/2023   IR PERCUTANEOUS ART THROMBECTOMY/INFUSION INTRACRANIAL INC DIAG ANGIO  11/30/2023   IR US  GUIDE VASC ACCESS RIGHT  11/30/2023   RADIOLOGY WITH ANESTHESIA N/A 11/30/2023   Procedure: IR WITH ANESTHESIA;  Surgeon: Radiologist, Medication, MD;  Location: [facility] OR;  Service: Radiology;  Laterality: N/A;   Patient Active Problem List   Diagnosis Date Noted   Dysphagia, post-stroke 12/16/2023   Spastic hemiparesis 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 (HCC) 12/16/2023   Right middle cerebral artery  stroke (HCC) 12/11/2023   Acute ischemic right MCA stroke (HCC) 11/30/2023   Acute right MCA stroke (HCC) 11/30/2023   Acute hypoxic respiratory failure (HCC) 11/30/2023   Acute metabolic encephalopathy 11/30/2023   Aspiration pneumonia (HCC) 11/30/2023   Incisional hernia, without obstruction or gangrene    Aortoiliac occlusive disease (HCC) 01/07/2017   Leriche syndrome (HCC)    PAD (peripheral artery disease) (HCC) 01/03/2017   Critical lower limb ischemia (HCC) 01/03/2017   Cellulitis 12/29/2016   Cellulitis of left lower extremity     ONSET DATE: November 30, 2023  REFERRING DIAG: Right middle cerebral artery stroke, Left spastic hemiparesis, Shoulder impingement syndrome, left   THERAPY DIAG:  Right middle cerebral artery stroke (HCC)  Muscle weakness (generalized)  Rationale for Evaluation and Treatment: Rehabilitation  SUBJECTIVE:  SUBJECTIVE STATEMENT:  Patient stated his left shoulder is hurting today and reported 6/10 pain at the start of treatment.   Pt accompanied by: self  PERTINENT HISTORY: peripheral artery disease, history of a CVA, and history of a seizure  PAIN:  Are you having pain? Yes: NPRS scale: 6/10 Pain location: left shoulder Pain description: intermittent, "hurts" Aggravating factors: raising his arm  Relieving factors: rest and medication  PRECAUTIONS: Fall  RED FLAGS: None   WEIGHT BEARING RESTRICTIONS: No  FALLS: Has patient fallen in last 6 months? Yes. Number of falls 1  LIVING ENVIRONMENT: Lives with: lives with their family Lives in: House/apartment Stairs:  Yes, but he primarily uses a ramp to enter and exit his home Has following equipment at home: shower chair, Grab bars, and Ramped entry  PLOF: Independent  PATIENT GOALS: improved arm  mobility  OBJECTIVE:  Note: Objective measures were completed at Evaluation unless otherwise noted.  DIAGNOSTIC FINDINGS: 01/06/24 IMPRESSION: 1. No acute traumatic injury identified. Chronic left lamina papyracea fracture. 2. Expected evolution of large Right MCA territory infarct since December with developing encephalomalacia. No acute intracranial abnormality. COGNITION: Overall cognitive status: Within functional limits for tasks assessed   SENSATION: Patient reports numbness in his left hand since his stroke in December 2024  COORDINATION: No significant deficits observed  UPPER EXTREMITY ROM:  Active ROM Right eval Left eval  Shoulder flexion 142 72; painful  Shoulder extension    Shoulder abduction 120 70; painful   Shoulder adduction    Shoulder extension    Shoulder internal rotation To L2 To sacrum  Shoulder external rotation To T3 To left ear  Elbow flexion    Elbow extension    Wrist flexion    Wrist extension    Wrist ulnar deviation    Wrist radial deviation    Wrist pronation    Wrist supination     (Blank rows = not tested)   UPPER EXTREMITY MMT:  MMT Right eval Left eval  Shoulder flexion 4+/5   Shoulder extension    Shoulder abduction 5/5   Shoulder adduction    Shoulder extension    Shoulder internal rotation 4/5   Shoulder external rotation 4+/5   Middle trapezius    Lower trapezius    Elbow flexion    Elbow extension    Wrist flexion    Wrist extension    Wrist ulnar deviation    Wrist radial deviation    Wrist pronation    Wrist supination    Grip strength 97 15   (Blank rows = not tested)   LOWER EXTREMITY MMT:    MMT Right Eval Left Eval  Hip flexion 4/5 3+/5  Hip extension    Hip abduction    Hip adduction    Hip internal rotation    Hip external rotation    Knee flexion 5/5 4+/5  Knee extension 5/5 4+/5  Ankle dorsiflexion 4+/5 4/5  Ankle plantarflexion    Ankle inversion    Ankle eversion    (Blank rows =  not tested)  TRANSFERS: Assistive device utilized: None  Sit to stand: Complete Independence Stand to sit: Complete Independence  GAIT: Gait pattern: step to pattern and shuffling Assistive device utilized: None Level of assistance: SBA  FUNCTIONAL TESTS:  5 times sit to stand: 13.88 seconds without UE support  TREATMENT DATE:    03/23/24    EXERCISE LOG   Exercise Repetitions and Resistance Comments  Nustep  Level 4 x 15 minutes  TE  UBE   TE  Pulley 5 mins   Rockerboard 3 mins TA  Forward Step Ups 6" box x 25 reps  TA  Ball Squeeze 3 mins   Rows Red x 25 reps bil NR  Extension Red x 25 reps bil  NR  LAQs 5# x 25 reps bil  3 steps out and in NR  Seated Marches 5# x 25 reps bil    Blank cell = exercise not performed today     03/08/24                                EXERCISE LOG  Exercise Repetitions and Resistance Comments  Nustep  Lvl 3 x 15 mins   UBE 120 RPM x 8 mins (forward/backward)   Pulleys 4 mins   AA Flexion Cane x 15 reps   AA Chest Press Cane x 15 reps   Bicep Curls 3# x 15 reps 2-way   LAQs 5# x 20 reps bil   Seated Marches  5# x 20 reps bil   Seated ham Curls Green x 20 reps bil    Blank cell = exercise not performed today   PATIENT EDUCATION: Education details: POC, healing, objective findings, benefits of therapy, and goals for physical therapy  Person educated: Patient Education method: Explanation Education comprehension: verbalized understanding  HOME EXERCISE PROGRAM: [id number]  GOALS: Goals reviewed with patient? Yes  SHORT TERM GOALS: Target date: 03/23/24  Patient will be independent with his initial HEP.  Baseline: Goal status: INITIAL  2.  Patient will be able to demonstrate at least 90 degrees of left shoulder flexion for improved function picking up items from his cabinet.  Baseline:  Goal status: INITIAL  3.  Patient  will be able to demonstrate at least 90 degrees of left shoulder abduction for improved function reaching.  Baseline:  Goal status: INITIAL  4.  Patient will improve his left hand grip strength to at least 25 pounds for improved function grasping items.  Baseline:  Goal status: INITIAL  LONG TERM GOALS: Target date: 04/13/24  Patient will be independent with his advanced HEP.  Baseline:  Goal status: INITIAL  2.  Patient will improve his five time sit to stand time to 12 seconds or less to reduce his fall risk.  Baseline:  Goal status: INITIAL  3.  Patient will be able to demonstrate at least 120 degrees of left shoulder flexion for improved function washing his hair.  Baseline:  Goal status: INITIAL  4.  Patient will be able to demonstrate at least 120 degrees of left shoulder abduction for improved function reaching overhead.  Baseline:  Goal status: INITIAL  5.  Patient will improve his left hand grip strength to at least 40 pounds for improved function carrying groceries.  Baseline:  Goal status: INITIAL  ASSESSMENT:  CLINICAL IMPRESSION:    Patient arrived for today's treatment session reporting 6/10 left shoulder pain.  Pt reports that his left hand tends to swell when he leaves it down by his side.  Pt able to tolerate increased reps with previously performed exercises.  Pt reports performed HEP at home regularly without any issues.  Pt denied any change in pain at completion of today's treatment session.  OBJECTIVE IMPAIRMENTS: Abnormal gait,  decreased activity tolerance, difficulty walking, decreased ROM, decreased strength, impaired sensation, impaired UE functional use, and pain.   ACTIVITY LIMITATIONS: carrying, lifting, reach over head, and locomotion level  PARTICIPATION LIMITATIONS: meal prep, cleaning, laundry, shopping, community activity, and yard work  PERSONAL FACTORS: Past/current experiences, Time since onset of injury/illness/exacerbation,  Transportation, and 3+ comorbidities: peripheral artery disease, history of a CVA, and history of a seizure  are also affecting patient's functional outcome.   REHAB POTENTIAL: Good  CLINICAL DECISION MAKING: Evolving/moderate complexity  EVALUATION COMPLEXITY: Moderate  PLAN:  PT FREQUENCY: 2x/week  PT DURATION: 6 weeks  PLANNED INTERVENTIONS: 97164- PT Re-evaluation, 97110-Therapeutic exercises, 97530- Therapeutic activity, [id number]- Neuromuscular re-education, 97535- Self Care, 28413- Manual therapy, [id number]- Gait training, [phone]- Electrical stimulation (unattended), Patient/Family education, Balance training, Cryotherapy, and Moist heat  PLAN FOR NEXT SESSION:  Nustep, UBE, continue upper and lower extremity strength, continue balance interventions, pulleys, AAROM, and gait training.    Deryl Flora, PTA 03/23/2024, 3:20 PM

## 2024-03-24 ENCOUNTER — Ambulatory Visit: Admitting: *Deleted

## 2024-03-24 ENCOUNTER — Encounter: Payer: Self-pay | Admitting: *Deleted

## 2024-03-24 DIAGNOSIS — I63511 Cerebral infarction due to unspecified occlusion or stenosis of right middle cerebral artery: Secondary | ICD-10-CM | POA: Diagnosis not present

## 2024-03-24 DIAGNOSIS — M6281 Muscle weakness (generalized): Secondary | ICD-10-CM

## 2024-03-24 NOTE — Therapy (Signed)
 OUTPATIENT PHYSICAL THERAPY NEURO TREATMENT   Patient Name: Ian Wong MRN: [id number] DOB:09-24-1961, 63 y.o., male Today's Date: 03/24/2024  REFERRING PROVIDER: Genetta Kenning, MD   END OF SESSION:  PT End of Session - 03/24/24 1535     Visit Number 5    Number of Visits 12    Date for PT Re-Evaluation 05/27/24    PT Start Time 1515    PT Stop Time 1604    PT Time Calculation (min) 49 min             Past Medical History:  Diagnosis Date   GERD (gastroesophageal reflux disease)    Hypothyroidism    Seizures (HCC)    Past Surgical History:  Procedure Laterality Date   AORTA - BILATERAL FEMORAL ARTERY BYPASS GRAFT Bilateral 01/07/2017   Procedure: AORTOBIFEMORAL BYPASS GRAFT;  Surgeon: Mayo Speck, MD;  Location: [facility] OR;  Service: Vascular;  Laterality: Bilateral;   I & D EXTREMITY Left 11/28/2014   Procedure: IRRIGATION AND DEBRIDEMENT;  REPAIR OF CRUSH INJURY;  Surgeon: Brunilda Capra, MD;  Location: [facility] OR;  Service: Orthopedics;  Laterality: Left;   INCISIONAL HERNIA REPAIR N/A 10/26/2017   Procedure: HERNIA REPAIR INCISIONAL WITH MESH;  Surgeon: Alanda Allegra, MD;  Location: [facility] ORS;  Service: General;  Laterality: N/A;   IR CT HEAD LTD  11/30/2023   IR INTRAVSC STENT CERV CAROTID W/EMB-PROT MOD SED INCL ANGIO  11/30/2023   IR PERCUTANEOUS ART THROMBECTOMY/INFUSION INTRACRANIAL INC DIAG ANGIO  11/30/2023   IR US  GUIDE VASC ACCESS RIGHT  11/30/2023   RADIOLOGY WITH ANESTHESIA N/A 11/30/2023   Procedure: IR WITH ANESTHESIA;  Surgeon: Radiologist, Medication, MD;  Location: [facility] OR;  Service: Radiology;  Laterality: N/A;   Patient Active Problem List   Diagnosis Date Noted   Dysphagia, post-stroke 12/16/2023   Spastic hemiparesis 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 (HCC) 12/16/2023   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   Acute ischemic right MCA stroke (HCC) 11/30/2023   Acute right MCA stroke (HCC) 11/30/2023   Acute  hypoxic respiratory failure (HCC) 11/30/2023   Acute metabolic encephalopathy 11/30/2023   Aspiration pneumonia (HCC) 11/30/2023   Incisional hernia, without obstruction or gangrene    Aortoiliac occlusive disease (HCC) 01/07/2017   Leriche syndrome (HCC)    PAD (peripheral artery disease) (HCC) 01/03/2017   Critical lower limb ischemia (HCC) 01/03/2017   Cellulitis 12/29/2016   Cellulitis of left lower extremity     ONSET DATE: November 30, 2023  REFERRING DIAG: Right middle cerebral artery stroke, Left spastic hemiparesis, Shoulder impingement syndrome, left   THERAPY DIAG:  Right middle cerebral artery stroke (HCC)  Muscle weakness (generalized)  Rationale for Evaluation and Treatment: Rehabilitation  SUBJECTIVE:  SUBJECTIVE STATEMENT:  Patient stated his left shoulder is hurting is doing better today and reported 4-5/10 pain at the start of treatment.   Pt accompanied by: self  PERTINENT HISTORY: peripheral artery disease, history of a CVA, and history of a seizure  PAIN:  Are you having pain? Yes: NPRS scale: 4-5/10 Pain location: left shoulder Pain description: intermittent, "hurts" Aggravating factors: raising his arm  Relieving factors: rest and medication  PRECAUTIONS: Fall  RED FLAGS: None   WEIGHT BEARING RESTRICTIONS: No  FALLS: Has patient fallen in last 6 months? Yes. Number of falls 1  LIVING ENVIRONMENT: Lives with: lives with their family Lives in: House/apartment Stairs:  Yes, but he primarily uses a ramp to enter and exit his home Has following equipment at home: shower chair, Grab bars, and Ramped entry  PLOF: Independent  PATIENT GOALS: improved arm mobility  OBJECTIVE:  Note: Objective measures were completed at Evaluation unless otherwise  noted.  DIAGNOSTIC FINDINGS: 01/06/24 IMPRESSION: 1. No acute traumatic injury identified. Chronic left lamina papyracea fracture. 2. Expected evolution of large Right MCA territory infarct since December with developing encephalomalacia. No acute intracranial abnormality. COGNITION: Overall cognitive status: Within functional limits for tasks assessed   SENSATION: Patient reports numbness in his left hand since his stroke in December 2024  COORDINATION: No significant deficits observed  UPPER EXTREMITY ROM:  Active ROM Right eval Left eval  Shoulder flexion 142 72; painful  Shoulder extension    Shoulder abduction 120 70; painful   Shoulder adduction    Shoulder extension    Shoulder internal rotation To L2 To sacrum  Shoulder external rotation To T3 To left ear  Elbow flexion    Elbow extension    Wrist flexion    Wrist extension    Wrist ulnar deviation    Wrist radial deviation    Wrist pronation    Wrist supination     (Blank rows = not tested)   UPPER EXTREMITY MMT:  MMT Right eval Left eval  Shoulder flexion 4+/5   Shoulder extension    Shoulder abduction 5/5   Shoulder adduction    Shoulder extension    Shoulder internal rotation 4/5   Shoulder external rotation 4+/5   Middle trapezius    Lower trapezius    Elbow flexion    Elbow extension    Wrist flexion    Wrist extension    Wrist ulnar deviation    Wrist radial deviation    Wrist pronation    Wrist supination    Grip strength 97 15   (Blank rows = not tested)   LOWER EXTREMITY MMT:    MMT Right Eval Left Eval  Hip flexion 4/5 3+/5  Hip extension    Hip abduction    Hip adduction    Hip internal rotation    Hip external rotation    Knee flexion 5/5 4+/5  Knee extension 5/5 4+/5  Ankle dorsiflexion 4+/5 4/5  Ankle plantarflexion    Ankle inversion    Ankle eversion    (Blank rows = not tested)  TRANSFERS: Assistive device utilized: None  Sit to stand: Complete  Independence Stand to sit: Complete Independence  GAIT: Gait pattern: step to pattern and shuffling Assistive device utilized: None Level of assistance: SBA  FUNCTIONAL TESTS:  5 times sit to stand: 13.88 seconds without UE support  TREATMENT DATE:    03/24/24    EXERCISE LOG  LT side weakness  Exercise Repetitions and Resistance Comments  Nustep  Level 4 x 15 minutes  TE  UBE  X 5 mins TE  Pulley 3  mins   Ball on table CW/CCW  3x10   each way   Ball/wall X 5  DC  To challenging  Rockerboard 3 mins TA  Forward Step Ups 6" box x 25 reps  TA  Tandem stance X 4  mins alternating front/ back  foot   Ball Squeeze    Rows Red  NR  Extension Red   NR  LAQs 5# x  3 x 10  reps bil  3 steps out and in NR  Seated Marches     Blank cell = exercise not performed today     03/08/24                                EXERCISE LOG  Exercise Repetitions and Resistance Comments  Nustep  Lvl 3 x 15 mins   UBE 120 RPM x 8 mins (forward/backward)   Pulleys 4 mins   AA Flexion Cane x 15 reps   AA Chest Press Cane x 15 reps   Bicep Curls 3# x 15 reps 2-way   LAQs 5# x 20 reps bil   Seated Marches  5# x 20 reps bil   Seated ham Curls Green x 20 reps bil    Blank cell = exercise not performed today   PATIENT EDUCATION: Education details: POC, healing, objective findings, benefits of therapy, and goals for physical therapy  Person educated: Patient Education method: Explanation Education comprehension: verbalized understanding  HOME EXERCISE PROGRAM: [id number]  GOALS: Goals reviewed with patient? Yes  SHORT TERM GOALS: Target date: 03/23/24  Patient will be independent with his initial HEP.  Baseline: Goal status: INITIAL  2.  Patient will be able to demonstrate at least 90 degrees of left shoulder flexion for improved function picking up items from his cabinet.  Baseline:   Goal status: INITIAL  3.  Patient will be able to demonstrate at least 90 degrees of left shoulder abduction for improved function reaching.  Baseline:  Goal status: INITIAL  4.  Patient will improve his left hand grip strength to at least 25 pounds for improved function grasping items.  Baseline:  Goal status: INITIAL  LONG TERM GOALS: Target date: 04/13/24  Patient will be independent with his advanced HEP.  Baseline:  Goal status: INITIAL  2.  Patient will improve his five time sit to stand time to 12 seconds or less to reduce his fall risk.  Baseline:  Goal status: INITIAL  3.  Patient will be able to demonstrate at least 120 degrees of left shoulder flexion for improved function washing his hair.  Baseline:  Goal status: INITIAL  4.  Patient will be able to demonstrate at least 120 degrees of left shoulder abduction for improved function reaching overhead.  Baseline:  Goal status: INITIAL  5.  Patient will improve his left hand grip strength to at least 40 pounds for improved function carrying groceries.  Baseline:  Goal status: INITIAL  ASSESSMENT:  CLINICAL IMPRESSION:    Patient arrived for today's treatment session reporting 4-5/10 left shoulder pain. Rx focused on RT UE and LT UE therex for strengthening as well as proprioception act.'s and were tolerated better today. Pt able to grip  better today with no assistance needed on UBE or pulleys. Attempted Ball /wall rolls, but very challenging for LT UE so were  DC.    OBJECTIVE IMPAIRMENTS: Abnormal gait, decreased activity tolerance, difficulty walking, decreased ROM, decreased strength, impaired sensation, impaired UE functional use, and pain.   ACTIVITY LIMITATIONS: carrying, lifting, reach over head, and locomotion level  PARTICIPATION LIMITATIONS: meal prep, cleaning, laundry, shopping, community activity, and yard work  PERSONAL FACTORS: Past/current experiences, Time since onset of  injury/illness/exacerbation, Transportation, and 3+ comorbidities: peripheral artery disease, history of a CVA, and history of a seizure  are also affecting patient's functional outcome.   REHAB POTENTIAL: Good  CLINICAL DECISION MAKING: Evolving/moderate complexity  EVALUATION COMPLEXITY: Moderate  PLAN:  PT FREQUENCY: 2x/week  PT DURATION: 6 weeks  PLANNED INTERVENTIONS: 97164- PT Re-evaluation, 97110-Therapeutic exercises, 97530- Therapeutic activity, [id number]- Neuromuscular re-education, 97535- Self Care, 16109- Manual therapy, [id number]- Gait training, [phone]- Electrical stimulation (unattended), Patient/Family education, Balance training, Cryotherapy, and Moist heat  PLAN FOR NEXT SESSION:  Nustep, UBE, continue upper and lower extremity strength LT , continue balance interventions, pulleys, AAROM, and gait training.    Colton Engdahl,CHRIS, PTA 03/24/2024, 6:34 PM

## 2024-03-25 ENCOUNTER — Ambulatory Visit (HOSPITAL_COMMUNITY)
Admission: RE | Admit: 2024-03-25 | Discharge: 2024-03-25 | Disposition: A | Discharge status: Home / Self Care | Source: Ambulatory Visit | Attending: Radiology | Admitting: Radiology

## 2024-03-25 DIAGNOSIS — I63511 Cerebral infarction due to unspecified occlusion or stenosis of right middle cerebral artery: Secondary | ICD-10-CM

## 2024-03-28 HISTORY — PX: IR RADIOLOGIST EVAL & MGMT: IMG5224

## 2024-03-31 ENCOUNTER — Ambulatory Visit: Attending: Physical Medicine & Rehabilitation

## 2024-03-31 DIAGNOSIS — M6281 Muscle weakness (generalized): Secondary | ICD-10-CM | POA: Diagnosis present

## 2024-03-31 DIAGNOSIS — I63511 Cerebral infarction due to unspecified occlusion or stenosis of right middle cerebral artery: Secondary | ICD-10-CM | POA: Insufficient documentation

## 2024-03-31 NOTE — Therapy (Signed)
 OUTPATIENT PHYSICAL THERAPY NEURO TREATMENT   Patient Name: Ian Wong MRN: [id number] DOB:11/01/1961, 63 y.o., male Today's Date: 03/31/2024  REFERRING PROVIDER: Genetta Kenning, MD   END OF SESSION:  PT End of Session - 03/31/24 1546     Visit Number 6    Number of Visits 12    Date for PT Re-Evaluation 05/27/24    PT Start Time 1515    PT Stop Time 1558    PT Time Calculation (min) 43 min    Activity Tolerance Patient tolerated treatment well    Behavior During Therapy [facility] for tasks assessed/performed              Past Medical History:  Diagnosis Date   GERD (gastroesophageal reflux disease)    Hypothyroidism    Seizures (HCC)    Past Surgical History:  Procedure Laterality Date   AORTA - BILATERAL FEMORAL ARTERY BYPASS GRAFT Bilateral 01/07/2017   Procedure: AORTOBIFEMORAL BYPASS GRAFT;  Surgeon: Mayo Speck, MD;  Location: [facility] OR;  Service: Vascular;  Laterality: Bilateral;   I & D EXTREMITY Left 11/28/2014   Procedure: IRRIGATION AND DEBRIDEMENT;  REPAIR OF CRUSH INJURY;  Surgeon: Brunilda Capra, MD;  Location: [facility] OR;  Service: Orthopedics;  Laterality: Left;   INCISIONAL HERNIA REPAIR N/A 10/26/2017   Procedure: HERNIA REPAIR INCISIONAL WITH MESH;  Surgeon: Alanda Allegra, MD;  Location: [facility] ORS;  Service: General;  Laterality: N/A;   IR CT HEAD LTD  11/30/2023   IR INTRAVSC STENT CERV CAROTID W/EMB-PROT MOD SED INCL ANGIO  11/30/2023   IR PERCUTANEOUS ART THROMBECTOMY/INFUSION INTRACRANIAL INC DIAG ANGIO  11/30/2023   IR RADIOLOGIST EVAL & MGMT  03/28/2024   IR US  GUIDE VASC ACCESS RIGHT  11/30/2023   RADIOLOGY WITH ANESTHESIA N/A 11/30/2023   Procedure: IR WITH ANESTHESIA;  Surgeon: Radiologist, Medication, MD;  Location: [facility] OR;  Service: Radiology;  Laterality: N/A;   Patient Active Problem List   Diagnosis Date Noted   Dysphagia, post-stroke 12/16/2023   Spastic hemiparesis 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 (HCC) 12/16/2023    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   Acute ischemic right MCA stroke (HCC) 11/30/2023   Acute right MCA stroke (HCC) 11/30/2023   Acute hypoxic respiratory failure (HCC) 11/30/2023   Acute metabolic encephalopathy 11/30/2023   Aspiration pneumonia (HCC) 11/30/2023   Incisional hernia, without obstruction or gangrene    Aortoiliac occlusive disease (HCC) 01/07/2017   Leriche syndrome (HCC)    PAD (peripheral artery disease) (HCC) 01/03/2017   Critical lower limb ischemia (HCC) 01/03/2017   Cellulitis 12/29/2016   Cellulitis of left lower extremity     ONSET DATE: November 30, 2023  REFERRING DIAG: Right middle cerebral artery stroke, Left spastic hemiparesis, Shoulder impingement syndrome, left   THERAPY DIAG:  Right middle cerebral artery stroke (HCC)  Muscle weakness (generalized)  Rationale for Evaluation and Treatment: Rehabilitation  SUBJECTIVE:  SUBJECTIVE STATEMENT:  Patient reports that he feels good today.   Pt accompanied by: self  PERTINENT HISTORY: peripheral artery disease, history of a CVA, and history of a seizure  PAIN:  Are you having pain? Yes: NPRS scale: no pain score reported Pain location: left shoulder Pain description: intermittent, "hurts" Aggravating factors: raising his arm  Relieving factors: rest and medication  PRECAUTIONS: Fall  RED FLAGS: None   WEIGHT BEARING RESTRICTIONS: No  FALLS: Has patient fallen in last 6 months? Yes. Number of falls 1  LIVING ENVIRONMENT: Lives with: lives with their family Lives in: House/apartment Stairs:  Yes, but he primarily uses a ramp to enter and exit his home Has following equipment at home: shower chair, Grab bars, and Ramped entry  PLOF: Independent  PATIENT GOALS: improved arm  mobility  OBJECTIVE:  Note: Objective measures were completed at Evaluation unless otherwise noted.  DIAGNOSTIC FINDINGS: 01/06/24 IMPRESSION: 1. No acute traumatic injury identified. Chronic left lamina papyracea fracture. 2. Expected evolution of large Right MCA territory infarct since December with developing encephalomalacia. No acute intracranial abnormality. COGNITION: Overall cognitive status: Within functional limits for tasks assessed   SENSATION: Patient reports numbness in his left hand since his stroke in December 2024  COORDINATION: No significant deficits observed  UPPER EXTREMITY ROM:  Active ROM Right eval Left eval  Shoulder flexion 142 72; painful  Shoulder extension    Shoulder abduction 120 70; painful   Shoulder adduction    Shoulder extension    Shoulder internal rotation To L2 To sacrum  Shoulder external rotation To T3 To left ear  Elbow flexion    Elbow extension    Wrist flexion    Wrist extension    Wrist ulnar deviation    Wrist radial deviation    Wrist pronation    Wrist supination     (Blank rows = not tested)   UPPER EXTREMITY MMT:  MMT Right eval Left eval  Shoulder flexion 4+/5   Shoulder extension    Shoulder abduction 5/5   Shoulder adduction    Shoulder extension    Shoulder internal rotation 4/5   Shoulder external rotation 4+/5   Middle trapezius    Lower trapezius    Elbow flexion    Elbow extension    Wrist flexion    Wrist extension    Wrist ulnar deviation    Wrist radial deviation    Wrist pronation    Wrist supination    Grip strength 97 15   (Blank rows = not tested)   LOWER EXTREMITY MMT:    MMT Right Eval Left Eval  Hip flexion 4/5 3+/5  Hip extension    Hip abduction    Hip adduction    Hip internal rotation    Hip external rotation    Knee flexion 5/5 4+/5  Knee extension 5/5 4+/5  Ankle dorsiflexion 4+/5 4/5  Ankle plantarflexion    Ankle inversion    Ankle eversion    (Blank rows =  not tested)  TRANSFERS: Assistive device utilized: None  Sit to stand: Complete Independence Stand to sit: Complete Independence  GAIT: Gait pattern: step to pattern and shuffling Assistive device utilized: None Level of assistance: SBA  FUNCTIONAL TESTS:  5 times sit to stand: 13.88 seconds without UE support  TREATMENT DATE:                                     03/31/24 EXERCISE LOG  Exercise Repetitions and Resistance Comments  Nustep  L4 x 15 minutes    Sit to stand  2 x 12 reps  From elevated table  Gripping  1.5# x 4 x 10 reps    Side stepping on foam  3 minutes  Intermittent UE support from parallel bars   Resisted row  Red t-band x 2 x 12 reps each    AA cane ABD  2 minutes   Step up  6" step x 3 minutes  Leading with LLE   Isometric ball squeeze  24 reps w/ 3 second hold     Blank cell = exercise not performed today   03/24/24    EXERCISE LOG  LT side weakness  Exercise Repetitions and Resistance Comments  Nustep  Level 4 x 15 minutes  TE  UBE  X 5 mins TE  Pulley 3  mins   Ball on table CW/CCW  3x10   each way   Ball/wall X 5  DC  To challenging  Rockerboard 3 mins TA  Forward Step Ups 6" box x 25 reps  TA  Tandem stance X 4  mins alternating front/ back  foot   Ball Squeeze    Rows Red  NR  Extension Red   NR  LAQs 5# x  3 x 10  reps bil  3 steps out and in NR  Seated Marches     Blank cell = exercise not performed today     03/08/24                                EXERCISE LOG  Exercise Repetitions and Resistance Comments  Nustep  Lvl 3 x 15 mins   UBE 120 RPM x 8 mins (forward/backward)   Pulleys 4 mins   AA Flexion Cane x 15 reps   AA Chest Press Cane x 15 reps   Bicep Curls 3# x 15 reps 2-way   LAQs 5# x 20 reps bil   Seated Marches  5# x 20 reps bil   Seated ham Curls Green x 20 reps bil    Blank cell = exercise not performed today    PATIENT EDUCATION: Education details: POC, healing, objective findings, benefits of therapy, and goals for physical therapy  Person educated: Patient Education method: Explanation Education comprehension: verbalized understanding  HOME EXERCISE PROGRAM: [id number]  GOALS: Goals reviewed with patient? Yes  SHORT TERM GOALS: Target date: 03/23/24  Patient will be independent with his initial HEP.  Baseline: Goal status: INITIAL  2.  Patient will be able to demonstrate at least 90 degrees of left shoulder flexion for improved function picking up items from his cabinet.  Baseline:  Goal status: INITIAL  3.  Patient will be able to demonstrate at least 90 degrees of left shoulder abduction for improved function reaching.  Baseline:  Goal status: INITIAL  4.  Patient will improve his left hand grip strength to at least 25 pounds for improved function grasping items.  Baseline:  Goal status: INITIAL  LONG TERM GOALS: Target date: 04/13/24  Patient will be independent with his advanced HEP.  Baseline:  Goal status: INITIAL  2.  Patient will improve  his five time sit to stand time to 12 seconds or less to reduce his fall risk.  Baseline:  Goal status: INITIAL  3.  Patient will be able to demonstrate at least 120 degrees of left shoulder flexion for improved function washing his hair.  Baseline:  Goal status: INITIAL  4.  Patient will be able to demonstrate at least 120 degrees of left shoulder abduction for improved function reaching overhead.  Baseline:  Goal status: INITIAL  5.  Patient will improve his left hand grip strength to at least 40 pounds for improved function carrying groceries.  Baseline:  Goal status: INITIAL  ASSESSMENT:  CLINICAL IMPRESSION:   Patient was progressed with multiple new interventions for improved functional mobility with activities such as reaching and grasping. He required moderate multimodal cueing with resisted rows for proper exercise  performance to prevent trunk rotation. He reported feeling alright upon the conclusion of treatment. He continues to require skilled physical therapy to address his remaining impairments to maximize his functional mobility.   OBJECTIVE IMPAIRMENTS: Abnormal gait, decreased activity tolerance, difficulty walking, decreased ROM, decreased strength, impaired sensation, impaired UE functional use, and pain.   ACTIVITY LIMITATIONS: carrying, lifting, reach over head, and locomotion level  PARTICIPATION LIMITATIONS: meal prep, cleaning, laundry, shopping, community activity, and yard work  PERSONAL FACTORS: Past/current experiences, Time since onset of injury/illness/exacerbation, Transportation, and 3+ comorbidities: peripheral artery disease, history of a CVA, and history of a seizure  are also affecting patient's functional outcome.   REHAB POTENTIAL: Good  CLINICAL DECISION MAKING: Evolving/moderate complexity  EVALUATION COMPLEXITY: Moderate  PLAN:  PT FREQUENCY: 2x/week  PT DURATION: 6 weeks  PLANNED INTERVENTIONS: 97164- PT Re-evaluation, 97110-Therapeutic exercises, 97530- Therapeutic activity, [id number]- Neuromuscular re-education, 97535- Self Care, 16109- Manual therapy, [id number]- Gait training, [phone]- Electrical stimulation (unattended), Patient/Family education, Balance training, Cryotherapy, and Moist heat  PLAN FOR NEXT SESSION:  Nustep, UBE, continue upper and lower extremity strength LT , continue balance interventions, pulleys, AAROM, and gait training.    Lane Pinon, PT 03/31/2024, 4:22 PM

## 2024-04-04 ENCOUNTER — Ambulatory Visit

## 2024-04-04 DIAGNOSIS — I63511 Cerebral infarction due to unspecified occlusion or stenosis of right middle cerebral artery: Secondary | ICD-10-CM

## 2024-04-04 DIAGNOSIS — M6281 Muscle weakness (generalized): Secondary | ICD-10-CM

## 2024-04-04 NOTE — Therapy (Signed)
 OUTPATIENT PHYSICAL THERAPY NEURO TREATMENT   Patient Name: Ian Wong MRN: [id number] DOB:[birth year], 62 y.o., male Today's Date: 04/04/2024  REFERRING PROVIDER: Genetta Kenning, MD   END OF SESSION:  PT End of Session - 04/04/24 1106     Visit Number 7    Number of Visits 12    Date for PT Re-Evaluation 05/27/24    PT Start Time 1102    PT Stop Time 1142    PT Time Calculation (min) 40 min    Activity Tolerance Patient tolerated treatment well    Behavior During Therapy [facility] for tasks assessed/performed              Past Medical History:  Diagnosis Date   GERD (gastroesophageal reflux disease)    Hypothyroidism    Seizures (HCC)    Past Surgical History:  Procedure Laterality Date   AORTA - BILATERAL FEMORAL ARTERY BYPASS GRAFT Bilateral 01/07/2017   Procedure: AORTOBIFEMORAL BYPASS GRAFT;  Surgeon: Mayo Speck, MD;  Location: [facility] OR;  Service: Vascular;  Laterality: Bilateral;   I & D EXTREMITY Left 11/28/2014   Procedure: IRRIGATION AND DEBRIDEMENT;  REPAIR OF CRUSH INJURY;  Surgeon: Brunilda Capra, MD;  Location: [facility] OR;  Service: Orthopedics;  Laterality: Left;   INCISIONAL HERNIA REPAIR N/A 10/26/2017   Procedure: HERNIA REPAIR INCISIONAL WITH MESH;  Surgeon: Alanda Allegra, MD;  Location: [facility] ORS;  Service: General;  Laterality: N/A;   IR CT HEAD LTD  11/30/2023   IR INTRAVSC STENT CERV CAROTID W/EMB-PROT MOD SED INCL ANGIO  11/30/2023   IR PERCUTANEOUS ART THROMBECTOMY/INFUSION INTRACRANIAL INC DIAG ANGIO  11/30/2023   IR RADIOLOGIST EVAL & MGMT  03/28/2024   IR US  GUIDE VASC ACCESS RIGHT  11/30/2023   RADIOLOGY WITH ANESTHESIA N/A 11/30/2023   Procedure: IR WITH ANESTHESIA;  Surgeon: Radiologist, Medication, MD;  Location: [facility] OR;  Service: Radiology;  Laterality: N/A;   Patient Active Problem List   Diagnosis Date Noted   Dysphagia, post-stroke 12/16/2023   Spastic hemiparesis 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 (HCC) 12/16/2023    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   Acute ischemic right MCA stroke (HCC) 11/30/2023   Acute right MCA stroke (HCC) 11/30/2023   Acute hypoxic respiratory failure (HCC) 11/30/2023   Acute metabolic encephalopathy 11/30/2023   Aspiration pneumonia (HCC) 11/30/2023   Incisional hernia, without obstruction or gangrene    Aortoiliac occlusive disease (HCC) 01/07/2017   Leriche syndrome (HCC)    PAD (peripheral artery disease) (HCC) 01/03/2017   Critical lower limb ischemia (HCC) 01/03/2017   Cellulitis 12/29/2016   Cellulitis of left lower extremity     ONSET DATE: November 30, 2023  REFERRING DIAG: Right middle cerebral artery stroke, Left spastic hemiparesis, Shoulder impingement syndrome, left   THERAPY DIAG:  Right middle cerebral artery stroke (HCC)  Muscle weakness (generalized)  Rationale for Evaluation and Treatment: Rehabilitation  SUBJECTIVE:  SUBJECTIVE STATEMENT:  Patient reports 5/10 left shoulder pain today.   Pt accompanied by: self  PERTINENT HISTORY: peripheral artery disease, history of a CVA, and history of a seizure  PAIN:  Are you having pain? Yes: NPRS scale: 5/10 Pain location: left shoulder Pain description: intermittent, "hurts" Aggravating factors: raising his arm  Relieving factors: rest and medication  PRECAUTIONS: Fall  RED FLAGS: None   WEIGHT BEARING RESTRICTIONS: No  FALLS: Has patient fallen in last 6 months? Yes. Number of falls 1  LIVING ENVIRONMENT: Lives with: lives with their family Lives in: House/apartment Stairs:  Yes, but he primarily uses a ramp to enter and exit his home Has following equipment at home: shower chair, Grab bars, and Ramped entry  PLOF: Independent  PATIENT GOALS: improved arm mobility  OBJECTIVE:   Note: Objective measures were completed at Evaluation unless otherwise noted.  DIAGNOSTIC FINDINGS: 01/06/24 IMPRESSION: 1. No acute traumatic injury identified. Chronic left lamina papyracea fracture. 2. Expected evolution of large Right MCA territory infarct since December with developing encephalomalacia. No acute intracranial abnormality. COGNITION: Overall cognitive status: Within functional limits for tasks assessed   SENSATION: Patient reports numbness in his left hand since his stroke in December 2024  COORDINATION: No significant deficits observed  UPPER EXTREMITY ROM:  Active ROM Right eval Left eval  Shoulder flexion 142 72; painful  Shoulder extension    Shoulder abduction 120 70; painful   Shoulder adduction    Shoulder extension    Shoulder internal rotation To L2 To sacrum  Shoulder external rotation To T3 To left ear  Elbow flexion    Elbow extension    Wrist flexion    Wrist extension    Wrist ulnar deviation    Wrist radial deviation    Wrist pronation    Wrist supination     (Blank rows = not tested)   UPPER EXTREMITY MMT:  MMT Right eval Left eval  Shoulder flexion 4+/5   Shoulder extension    Shoulder abduction 5/5   Shoulder adduction    Shoulder extension    Shoulder internal rotation 4/5   Shoulder external rotation 4+/5   Middle trapezius    Lower trapezius    Elbow flexion    Elbow extension    Wrist flexion    Wrist extension    Wrist ulnar deviation    Wrist radial deviation    Wrist pronation    Wrist supination    Grip strength 97 15   (Blank rows = not tested)   LOWER EXTREMITY MMT:    MMT Right Eval Left Eval  Hip flexion 4/5 3+/5  Hip extension    Hip abduction    Hip adduction    Hip internal rotation    Hip external rotation    Knee flexion 5/5 4+/5  Knee extension 5/5 4+/5  Ankle dorsiflexion 4+/5 4/5  Ankle plantarflexion    Ankle inversion    Ankle eversion    (Blank rows = not  tested)  TRANSFERS: Assistive device utilized: None  Sit to stand: Complete Independence Stand to sit: Complete Independence  GAIT: Gait pattern: step to pattern and shuffling Assistive device utilized: None Level of assistance: SBA  FUNCTIONAL TESTS:  5 times sit to stand: 13.88 seconds without UE support  TREATMENT DATE:     04/04/14                                 EXERCISE LOG  Exercise Repetitions and Resistance Comments  Nustep  Lvl 4 x 15 mins   Pulleys 5 mins   5 STS 11.7 seconds   Cybex Knee Flexion 30# x 3.5 mins   Cybex Knee Extension 10# x 3.5 mins   Cybex Leg Pres 2 plates; seat 7 x 3 mins    Blank cell = exercise not performed today                                    03/31/24 EXERCISE LOG  Exercise Repetitions and Resistance Comments  Nustep  L4 x 15 minutes    Sit to stand  2 x 12 reps  From elevated table  Gripping  1.5# x 4 x 10 reps    Side stepping on foam  3 minutes  Intermittent UE support from parallel bars   Resisted row  Red t-band x 2 x 12 reps each    AA cane ABD  2 minutes   Step up  6" step x 3 minutes  Leading with LLE   Isometric ball squeeze  24 reps w/ 3 second hold     Blank cell = exercise not performed today   03/24/24    EXERCISE LOG  LT side weakness  Exercise Repetitions and Resistance Comments  Nustep  Level 4 x 15 minutes  TE  UBE  X 5 mins TE  Pulley 3  mins   Ball on table CW/CCW  3x10   each way   Ball/wall X 5  DC  To challenging  Rockerboard 3 mins TA  Forward Step Ups 6" box x 25 reps  TA  Tandem stance X 4  mins alternating front/ back  foot   Ball Squeeze    Rows Red  NR  Extension Red   NR  LAQs 5# x  3 x 10  reps bil  3 steps out and in NR  Seated Marches     Blank cell = exercise not performed today    PATIENT EDUCATION: Education details: POC, healing, objective findings, benefits of therapy, and  goals for physical therapy  Person educated: Patient Education method: Explanation Education comprehension: verbalized understanding  HOME EXERCISE PROGRAM: [id number]  GOALS: Goals reviewed with patient? Yes  SHORT TERM GOALS: Target date: 03/23/24  Patient will be independent with his initial HEP.  Baseline: Goal status: MET  2.  Patient will be able to demonstrate at least 90 degrees of left shoulder flexion for improved function picking up items from his cabinet.  Baseline: 5/5: 91 degrees Goal status: MET  3.  Patient will be able to demonstrate at least 90 degrees of left shoulder abduction for improved function reaching.  Baseline: 75 degrees Goal status: IN PROGRESS  4.  Patient will improve his left hand grip strength to at least 25 pounds for improved function grasping items.  Baseline: 5/5: 15# Goal status: IN PROGRESS  LONG TERM GOALS: Target date: 04/13/24  Patient will be independent with his advanced HEP.  Baseline:  Goal status: IN PROGRESS  2.  Patient will improve his five time sit to stand time to 12 seconds or less to reduce his fall risk.  Baseline:  5/5: 11.7 seconds Goal status: MET  3.  Patient will be able to demonstrate at least 120 degrees of left shoulder flexion for improved function washing his hair.  Baseline:  Goal status: IN PROGRESS  4.  Patient will be able to demonstrate at least 120 degrees of left shoulder abduction for improved function reaching overhead.  Baseline:  Goal status: IN PROGRESS  5.  Patient will improve his left hand grip strength to at least 40 pounds for improved function carrying groceries.  Baseline:  Goal status: IN PROGRESS  ASSESSMENT:  CLINICAL IMPRESSION:   Pt arrives for today's treatment session reporting 5/10 left shoulder pain.  Pt states that he has already been doing shoulder exercises this morning.  Pt able to demonstrate 91 degrees of left shoulder flexion with heavy compensatory movements, meeting  his STG.  Pt has made progress with his abduction and grip strength.  Pt able to perform 5 STS in 11.7 seconds, meeting his LTG.  Pt concentrated on LE exercises today per pt's request due to increased shoulder pain.  Pt requiring min cues for eccentric control with all cybex exercises today.  Pt reported BLE fatigue at completion of today's treatment session without any change in left shoulder pain.   OBJECTIVE IMPAIRMENTS: Abnormal gait, decreased activity tolerance, difficulty walking, decreased ROM, decreased strength, impaired sensation, impaired UE functional use, and pain.   ACTIVITY LIMITATIONS: carrying, lifting, reach over head, and locomotion level  PARTICIPATION LIMITATIONS: meal prep, cleaning, laundry, shopping, community activity, and yard work  PERSONAL FACTORS: Past/current experiences, Time since onset of injury/illness/exacerbation, Transportation, and 3+ comorbidities: peripheral artery disease, history of a CVA, and history of a seizure  are also affecting patient's functional outcome.   REHAB POTENTIAL: Good  CLINICAL DECISION MAKING: Evolving/moderate complexity  EVALUATION COMPLEXITY: Moderate  PLAN:  PT FREQUENCY: 2x/week  PT DURATION: 6 weeks  PLANNED INTERVENTIONS: 97164- PT Re-evaluation, 97110-Therapeutic exercises, 97530- Therapeutic activity, [id number]- Neuromuscular re-education, 97535- Self Care, 16109- Manual therapy, [id number]- Gait training, [phone]- Electrical stimulation (unattended), Patient/Family education, Balance training, Cryotherapy, and Moist heat  PLAN FOR NEXT SESSION:  Nustep, UBE, continue upper and lower extremity strength LT , continue balance interventions, pulleys, AAROM, and gait training.    Deryl Flora, PTA 04/04/2024, 11:47 AM

## 2024-04-08 ENCOUNTER — Encounter: Payer: Self-pay | Admitting: *Deleted

## 2024-04-08 ENCOUNTER — Ambulatory Visit: Admitting: *Deleted

## 2024-04-08 DIAGNOSIS — I63511 Cerebral infarction due to unspecified occlusion or stenosis of right middle cerebral artery: Secondary | ICD-10-CM | POA: Diagnosis not present

## 2024-04-08 DIAGNOSIS — M6281 Muscle weakness (generalized): Secondary | ICD-10-CM

## 2024-04-08 NOTE — Therapy (Signed)
 OUTPATIENT PHYSICAL THERAPY NEURO TREATMENT   Patient Name: Ian Wong MRN: [id number] DOB:Dec 19, 1960, 63 y.o., male Today's Date: 04/08/2024  REFERRING PROVIDER: Genetta Kenning, MD   END OF SESSION:  PT End of Session - 04/08/24 1204     Visit Number 8    Number of Visits 12    Date for PT Re-Evaluation 05/27/24    PT Start Time 1157    PT Stop Time 1230    PT Time Calculation (min) 33 min              Past Medical History:  Diagnosis Date   GERD (gastroesophageal reflux disease)    Hypothyroidism    Seizures (HCC)    Past Surgical History:  Procedure Laterality Date   AORTA - BILATERAL FEMORAL ARTERY BYPASS GRAFT Bilateral 01/07/2017   Procedure: AORTOBIFEMORAL BYPASS GRAFT;  Surgeon: Mayo Speck, MD;  Location: [facility] OR;  Service: Vascular;  Laterality: Bilateral;   I & D EXTREMITY Left 11/28/2014   Procedure: IRRIGATION AND DEBRIDEMENT;  REPAIR OF CRUSH INJURY;  Surgeon: Brunilda Capra, MD;  Location: [facility] OR;  Service: Orthopedics;  Laterality: Left;   INCISIONAL HERNIA REPAIR N/A 10/26/2017   Procedure: HERNIA REPAIR INCISIONAL WITH MESH;  Surgeon: Alanda Allegra, MD;  Location: [facility] ORS;  Service: General;  Laterality: N/A;   IR CT HEAD LTD  11/30/2023   IR INTRAVSC STENT CERV CAROTID W/EMB-PROT MOD SED INCL ANGIO  11/30/2023   IR PERCUTANEOUS ART THROMBECTOMY/INFUSION INTRACRANIAL INC DIAG ANGIO  11/30/2023   IR RADIOLOGIST EVAL & MGMT  03/28/2024   IR US  GUIDE VASC ACCESS RIGHT  11/30/2023   RADIOLOGY WITH ANESTHESIA N/A 11/30/2023   Procedure: IR WITH ANESTHESIA;  Surgeon: Radiologist, Medication, MD;  Location: [facility] OR;  Service: Radiology;  Laterality: N/A;   Patient Active Problem List   Diagnosis Date Noted   Dysphagia, post-stroke 12/16/2023   Spastic hemiparesis 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 (HCC) 12/16/2023   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   Acute ischemic right MCA stroke (HCC) 11/30/2023   Acute  right MCA stroke (HCC) 11/30/2023   Acute hypoxic respiratory failure (HCC) 11/30/2023   Acute metabolic encephalopathy 11/30/2023   Aspiration pneumonia (HCC) 11/30/2023   Incisional hernia, without obstruction or gangrene    Aortoiliac occlusive disease (HCC) 01/07/2017   Leriche syndrome (HCC)    PAD (peripheral artery disease) (HCC) 01/03/2017   Critical lower limb ischemia (HCC) 01/03/2017   Cellulitis 12/29/2016   Cellulitis of left lower extremity     ONSET DATE: November 30, 2023  REFERRING DIAG: Right middle cerebral artery stroke, Left spastic hemiparesis, Shoulder impingement syndrome, left   THERAPY DIAG:  Right middle cerebral artery stroke (HCC)  Muscle weakness (generalized)  Rationale for Evaluation and Treatment: Rehabilitation  SUBJECTIVE:  SUBJECTIVE STATEMENT:  Patient reports 6/10 left shoulder pain today. Hurting more today.Work on legs today  Pt accompanied by: self  PERTINENT HISTORY: peripheral artery disease, history of a CVA, and history of a seizure  PAIN:  Are you having pain? Yes: NPRS scale: 6/10 Pain location: left shoulder Pain description: intermittent, "hurts" Aggravating factors: raising his arm  Relieving factors: rest and medication  PRECAUTIONS: Fall  RED FLAGS: None   WEIGHT BEARING RESTRICTIONS: No  FALLS: Has patient fallen in last 6 months? Yes. Number of falls 1  LIVING ENVIRONMENT: Lives with: lives with their family Lives in: House/apartment Stairs: Yes, but he primarily uses a ramp to enter and exit his home Has following equipment at home: shower chair, Grab bars, and Ramped entry  PLOF: Independent  PATIENT GOALS: improved arm mobility  OBJECTIVE:  Note: Objective measures were completed at Evaluation unless otherwise  noted.  DIAGNOSTIC FINDINGS: 01/06/24 IMPRESSION: 1. No acute traumatic injury identified. Chronic left lamina papyracea fracture. 2. Expected evolution of large Right MCA territory infarct since December with developing encephalomalacia. No acute intracranial abnormality. COGNITION: Overall cognitive status: Within functional limits for tasks assessed   SENSATION: Patient reports numbness in his left hand since his stroke in December 2024  COORDINATION: No significant deficits observed  UPPER EXTREMITY ROM:  Active ROM Right eval Left eval  Shoulder flexion 142 72; painful  Shoulder extension    Shoulder abduction 120 70; painful   Shoulder adduction    Shoulder extension    Shoulder internal rotation To L2 To sacrum  Shoulder external rotation To T3 To left ear  Elbow flexion    Elbow extension    Wrist flexion    Wrist extension    Wrist ulnar deviation    Wrist radial deviation    Wrist pronation    Wrist supination     (Blank rows = not tested)   UPPER EXTREMITY MMT:  MMT Right eval Left eval  Shoulder flexion 4+/5   Shoulder extension    Shoulder abduction 5/5   Shoulder adduction    Shoulder extension    Shoulder internal rotation 4/5   Shoulder external rotation 4+/5   Middle trapezius    Lower trapezius    Elbow flexion    Elbow extension    Wrist flexion    Wrist extension    Wrist ulnar deviation    Wrist radial deviation    Wrist pronation    Wrist supination    Grip strength 97 15   (Blank rows = not tested)   LOWER EXTREMITY MMT:    MMT Right Eval Left Eval  Hip flexion 4/5 3+/5  Hip extension    Hip abduction    Hip adduction    Hip internal rotation    Hip external rotation    Knee flexion 5/5 4+/5  Knee extension 5/5 4+/5  Ankle dorsiflexion 4+/5 4/5  Ankle plantarflexion    Ankle inversion    Ankle eversion    (Blank rows = not tested)  TRANSFERS: Assistive device utilized: None  Sit to stand: Complete  Independence Stand to sit: Complete Independence  GAIT: Gait pattern: step to pattern and shuffling Assistive device utilized: None Level of assistance: SBA  FUNCTIONAL TESTS:  5 times sit to stand: 13.88 seconds without UE support  TREATMENT DATE:     04/08/14                                 EXERCISE LOG   LT side  Exercise Repetitions and Resistance Comments  Nustep  Lvl 4 x 10 mins   Pulleys    5 STS    Cybex Knee Flexion 30# x 3.5 mins   Cybex Knee Extension 20# x 3.5 mins   Cybex Leg Pres    Rocker board  X 4 mins   tandem stance X 4 with each foot forward   Ball/wall attempted To challenging  Ball on table CW/CCW circles 3x10 each way Added to HEP   Blank cell = exercise not performed today  Reviewed HEP pulleys and tband exs, and wts                                   03/31/24 EXERCISE LOG  Exercise Repetitions and Resistance Comments  Nustep  L4 x 15 minutes    Sit to stand  2 x 12 reps  From elevated table  Gripping  1.5# x 4 x 10 reps    Side stepping on foam  3 minutes  Intermittent UE support from parallel bars   Resisted row  Red t-band x 2 x 12 reps each    AA cane ABD  2 minutes   Step up  6" step x 3 minutes  Leading with LLE   Isometric ball squeeze  24 reps w/ 3 second hold     Blank cell = exercise not performed today   03/24/24    EXERCISE LOG  LT side weakness  Exercise Repetitions and Resistance Comments  Nustep  Level 4 x 15 minutes  TE  UBE  X 5 mins TE  Pulley 3  mins   Ball on table CW/CCW  3x10   each way   Ball/wall X 5  DC  To challenging  Rockerboard 3 mins TA  Forward Step Ups 6" box x 25 reps  TA  Tandem stance X 4  mins alternating front/ back  foot   Ball Squeeze    Rows Red  NR  Extension Red   NR  LAQs 5# x  3 x 10  reps bil  3 steps out and in NR  Seated Marches     Blank cell = exercise not performed today     PATIENT EDUCATION: Education details: POC, healing, objective findings, benefits of therapy, and goals for physical therapy  Person educated: Patient Education method: Explanation Education comprehension: verbalized understanding  HOME EXERCISE PROGRAM: [id number]  GOALS: Goals reviewed with patient? Yes  SHORT TERM GOALS: Target date: 03/23/24  Patient will be independent with his initial HEP.  Baseline: Goal status: MET  2.  Patient will be able to demonstrate at least 90 degrees of left shoulder flexion for improved function picking up items from his cabinet.  Baseline: 5/5: 91 degrees Goal status: MET  3.  Patient will be able to demonstrate at least 90 degrees of left shoulder abduction for improved function reaching.  Baseline: 75 degrees Goal status: IN PROGRESS  4.  Patient will improve his left hand grip strength to at least 25 pounds for improved function grasping items.  Baseline: 5/5: 15# Goal status: IN PROGRESS  LONG TERM GOALS: Target date: 04/13/24  Patient  will be independent with his advanced HEP.  Baseline:  Goal status: IN PROGRESS  2.  Patient will improve his five time sit to stand time to 12 seconds or less to reduce his fall risk.  Baseline: 5/5: 11.7 seconds Goal status: MET  3.  Patient will be able to demonstrate at least 120 degrees of left shoulder flexion for improved function washing his hair.  Baseline:  Goal status: IN PROGRESS  4.  Patient will be able to demonstrate at least 120 degrees of left shoulder abduction for improved function reaching overhead.  Baseline:  Goal status: IN PROGRESS  5.  Patient will improve his left hand grip strength to at least 40 pounds for improved function carrying groceries.  Baseline:  Goal status: IN PROGRESS  ASSESSMENT:  CLINICAL IMPRESSION:   Pt arrives  today reporting increased shldr pain and wanted to focus more on LE's. Rx focused on LE strengthening as well as balance act,'s and review  of HEP for shldr with added ball on table CW/CCW circles. Pt did fairly well with strengthening exs, but  challenged with balance act,'s and needs CGA/SBA during.   OBJECTIVE IMPAIRMENTS: Abnormal gait, decreased activity tolerance, difficulty walking, decreased ROM, decreased strength, impaired sensation, impaired UE functional use, and pain.   ACTIVITY LIMITATIONS: carrying, lifting, reach over head, and locomotion level  PARTICIPATION LIMITATIONS: meal prep, cleaning, laundry, shopping, community activity, and yard work  PERSONAL FACTORS: Past/current experiences, Time since onset of injury/illness/exacerbation, Transportation, and 3+ comorbidities: peripheral artery disease, history of a CVA, and history of a seizure are also affecting patient's functional outcome.   REHAB POTENTIAL: Good  CLINICAL DECISION MAKING: Evolving/moderate complexity  EVALUATION COMPLEXITY: Moderate  PLAN:  PT FREQUENCY: 2x/week  PT DURATION: 6 weeks  PLANNED INTERVENTIONS: 97164- PT Re-evaluation, 97110-Therapeutic exercises, 97530- Therapeutic activity, [id number]- Neuromuscular re-education, 97535- Self Care, 16109- Manual therapy, [id number]- Gait training, [phone]- Electrical stimulation (unattended), Patient/Family education, Balance training, Cryotherapy, and Moist heat  PLAN FOR NEXT SESSION:  Nustep, UBE, continue upper and lower extremity strength LT , continue balance interventions, pulleys, AAROM, and gait training.    Daly Whipkey,CHRIS, PTA 04/08/2024, 12:42 PM

## 2024-04-14 ENCOUNTER — Encounter: Admitting: Physical Medicine & Rehabilitation

## 2024-04-14 ENCOUNTER — Ambulatory Visit

## 2024-04-14 DIAGNOSIS — I63511 Cerebral infarction due to unspecified occlusion or stenosis of right middle cerebral artery: Secondary | ICD-10-CM

## 2024-04-14 DIAGNOSIS — M6281 Muscle weakness (generalized): Secondary | ICD-10-CM

## 2024-04-14 NOTE — Therapy (Signed)
 OUTPATIENT PHYSICAL THERAPY NEURO TREATMENT   Patient Name: Ian Wong MRN: [id number] DOB:July 04, 1961, 63 y.o., male Today's Date: 04/14/2024  REFERRING PROVIDER: Genetta Kenning, MD   END OF SESSION:  PT End of Session - 04/14/24 1436     Visit Number 9    Number of Visits 12    Date for PT Re-Evaluation 05/27/24    PT Start Time 1432    PT Stop Time 1515    PT Time Calculation (min) 43 min    Activity Tolerance Patient tolerated treatment well    Behavior During Therapy [facility] for tasks assessed/performed               Past Medical History:  Diagnosis Date   GERD (gastroesophageal reflux disease)    Hypothyroidism    Seizures (HCC)    Past Surgical History:  Procedure Laterality Date   AORTA - BILATERAL FEMORAL ARTERY BYPASS GRAFT Bilateral 01/07/2017   Procedure: AORTOBIFEMORAL BYPASS GRAFT;  Surgeon: Mayo Speck, MD;  Location: [facility] OR;  Service: Vascular;  Laterality: Bilateral;   I & D EXTREMITY Left 11/28/2014   Procedure: IRRIGATION AND DEBRIDEMENT;  REPAIR OF CRUSH INJURY;  Surgeon: Brunilda Capra, MD;  Location: [facility] OR;  Service: Orthopedics;  Laterality: Left;   INCISIONAL HERNIA REPAIR N/A 10/26/2017   Procedure: HERNIA REPAIR INCISIONAL WITH MESH;  Surgeon: Alanda Allegra, MD;  Location: [facility] ORS;  Service: General;  Laterality: N/A;   IR CT HEAD LTD  11/30/2023   IR INTRAVSC STENT CERV CAROTID W/EMB-PROT MOD SED INCL ANGIO  11/30/2023   IR PERCUTANEOUS ART THROMBECTOMY/INFUSION INTRACRANIAL INC DIAG ANGIO  11/30/2023   IR RADIOLOGIST EVAL & MGMT  03/28/2024   IR US  GUIDE VASC ACCESS RIGHT  11/30/2023   RADIOLOGY WITH ANESTHESIA N/A 11/30/2023   Procedure: IR WITH ANESTHESIA;  Surgeon: Radiologist, Medication, MD;  Location: [facility] OR;  Service: Radiology;  Laterality: N/A;   Patient Active Problem List   Diagnosis Date Noted   Dysphagia, post-stroke 12/16/2023   Spastic hemiparesis 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 (HCC)  12/16/2023   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   Acute ischemic right MCA stroke (HCC) 11/30/2023   Acute right MCA stroke (HCC) 11/30/2023   Acute hypoxic respiratory failure (HCC) 11/30/2023   Acute metabolic encephalopathy 11/30/2023   Aspiration pneumonia (HCC) 11/30/2023   Incisional hernia, without obstruction or gangrene    Aortoiliac occlusive disease (HCC) 01/07/2017   Leriche syndrome (HCC)    PAD (peripheral artery disease) (HCC) 01/03/2017   Critical lower limb ischemia (HCC) 01/03/2017   Cellulitis 12/29/2016   Cellulitis of left lower extremity     ONSET DATE: November 30, 2023  REFERRING DIAG: Right middle cerebral artery stroke, Left spastic hemiparesis, Shoulder impingement syndrome, left   THERAPY DIAG:  Right middle cerebral artery stroke (HCC)  Muscle weakness (generalized)  Rationale for Evaluation and Treatment: Rehabilitation  SUBJECTIVE:  SUBJECTIVE STATEMENT:  Patient reports that his shoulder is bothering him about as much as his last appointment. He can tell that he is getting better since starting therapy.   Pt accompanied by: self  PERTINENT HISTORY: peripheral artery disease, history of a CVA, and history of a seizure  PAIN:  Are you having pain? Yes: NPRS scale: 5/10 Pain location: left shoulder Pain description: intermittent, "hurts" Aggravating factors: raising his arm  Relieving factors: rest and medication  PRECAUTIONS: Fall  RED FLAGS: None   WEIGHT BEARING RESTRICTIONS: No  FALLS: Has patient fallen in last 6 months? Yes. Number of falls 1  LIVING ENVIRONMENT: Lives with: lives with their family Lives in: House/apartment Stairs: Yes, but he primarily uses a ramp to enter and exit his home Has following equipment at home:  shower chair, Grab bars, and Ramped entry  PLOF: Independent  PATIENT GOALS: improved arm mobility  OBJECTIVE:  Note: Objective measures were completed at Evaluation unless otherwise noted.  DIAGNOSTIC FINDINGS: 01/06/24 IMPRESSION: 1. No acute traumatic injury identified. Chronic left lamina papyracea fracture. 2. Expected evolution of large Right MCA territory infarct since December with developing encephalomalacia. No acute intracranial abnormality. COGNITION: Overall cognitive status: Within functional limits for tasks assessed   SENSATION: Patient reports numbness in his left hand since his stroke in December 2024  COORDINATION: No significant deficits observed  UPPER EXTREMITY ROM:  Active ROM Right eval Left eval  Shoulder flexion 142 72; painful  Shoulder extension    Shoulder abduction 120 70; painful   Shoulder adduction    Shoulder extension    Shoulder internal rotation To L2 To sacrum  Shoulder external rotation To T3 To left ear  Elbow flexion    Elbow extension    Wrist flexion    Wrist extension    Wrist ulnar deviation    Wrist radial deviation    Wrist pronation    Wrist supination     (Blank rows = not tested)   UPPER EXTREMITY MMT:  MMT Right eval Left eval  Shoulder flexion 4+/5   Shoulder extension    Shoulder abduction 5/5   Shoulder adduction    Shoulder extension    Shoulder internal rotation 4/5   Shoulder external rotation 4+/5   Middle trapezius    Lower trapezius    Elbow flexion    Elbow extension    Wrist flexion    Wrist extension    Wrist ulnar deviation    Wrist radial deviation    Wrist pronation    Wrist supination    Grip strength 97 15   (Blank rows = not tested)   LOWER EXTREMITY MMT:    MMT Right Eval Left Eval  Hip flexion 4/5 3+/5  Hip extension    Hip abduction    Hip adduction    Hip internal rotation    Hip external rotation    Knee flexion 5/5 4+/5  Knee extension 5/5 4+/5  Ankle  dorsiflexion 4+/5 4/5  Ankle plantarflexion    Ankle inversion    Ankle eversion    (Blank rows = not tested)  TRANSFERS: Assistive device utilized: None  Sit to stand: Complete Independence Stand to sit: Complete Independence  GAIT: Gait pattern: step to pattern and shuffling Assistive device utilized: None Level of assistance: SBA  FUNCTIONAL TESTS:  5 times sit to stand: 13.88 seconds without UE support  TREATMENT DATE:                                     04/14/24 EXERCISE LOG  Exercise Repetitions and Resistance Comments  Nustep L4 x 11 minutes   Pulleys  2 minutes  Flexion  L shoulder assessment AROM and grip strength   Cybex knee flexion 50# x 2 minutes   Cybex knee extension 30# x 2 x 1 minute    Ball roll out  3 minutes For shoulder flexion  Isometric ball press  3 minutes w/ 3 second hold   Ball circles  3 minutes  CW and CCW w/ ball on the table  Isometric ball squeeze  3 minutes w/ 3 second hold  For shoulder IR   Blank cell = exercise not performed today    04/08/14                                 EXERCISE LOG   LT side  Exercise Repetitions and Resistance Comments  Nustep  Lvl 4 x 10 mins   Pulleys    5 STS    Cybex Knee Flexion 30# x 3.5 mins   Cybex Knee Extension 20# x 3.5 mins   Cybex Leg Pres    Rocker board  X 4 mins   tandem stance X 4 with each foot forward   Ball/wall attempted To challenging  Ball on table CW/CCW circles 3x10 each way Added to HEP   Blank cell = exercise not performed today  Reviewed HEP pulleys and tband exs, and wts                                   03/31/24 EXERCISE LOG  Exercise Repetitions and Resistance Comments  Nustep  L4 x 15 minutes    Sit to stand  2 x 12 reps  From elevated table  Gripping  1.5# x 4 x 10 reps    Side stepping on foam  3 minutes  Intermittent UE support from parallel bars   Resisted row   Red t-band x 2 x 12 reps each    AA cane ABD  2 minutes   Step up  6" step x 3 minutes  Leading with LLE   Isometric ball squeeze  24 reps w/ 3 second hold     Blank cell = exercise not performed today   PATIENT EDUCATION: Education details: POC, healing, objective findings, benefits of therapy, and goals for physical therapy  Person educated: Patient Education method: Explanation Education comprehension: verbalized understanding  HOME EXERCISE PROGRAM: [id number]  GOALS: Goals reviewed with patient? Yes  SHORT TERM GOALS: Target date: 03/23/24  Patient will be independent with his initial HEP.  Baseline: Goal status: MET  2.  Patient will be able to demonstrate at least 90 degrees of left shoulder flexion for improved function picking up items from his cabinet.  Baseline: 5/5: 91 degrees 04/14/24: 95 degrees Goal status: MET  3.  Patient will be able to demonstrate at least 90 degrees of left shoulder abduction for improved function reaching.  Baseline: 75 degrees 04/14/24: 71 degrees Goal status: IN PROGRESS  4.  Patient will improve his left hand grip strength to at least 25 pounds for improved function grasping items.  Baseline:  5/5: 15# 04/14/24: 10# Goal status: IN PROGRESS  LONG TERM GOALS: Target date: 04/13/24  Patient will be independent with his advanced HEP.  Baseline:  Goal status: IN PROGRESS  2.  Patient will improve his five time sit to stand time to 12 seconds or less to reduce his fall risk.  Baseline: 5/5: 11.7 seconds Goal status: MET  3.  Patient will be able to demonstrate at least 120 degrees of left shoulder flexion for improved function washing his hair.  Baseline: see short term goals Goal status: IN PROGRESS  4.  Patient will be able to demonstrate at least 120 degrees of left shoulder abduction for improved function reaching overhead.  Baseline: see short term goals Goal status: IN PROGRESS  5.  Patient will improve his left hand grip  strength to at least 40 pounds for improved function carrying groceries.  Baseline: see short term goals Goal status: IN PROGRESS  ASSESSMENT:  CLINICAL IMPRESSION:   Patient is making fair progress with skilled physical therapy as evidenced by his subjective reports, objective measures, functional mobility, and progress toward his goals since his initial evaluation. He was able to meet his short term left shoulder flexion goal. However, he has yet to meet any of his long term goals for physical therapy at this time. Today's treatment focused primarily on left shoulder and upper extremity interventions with moderate difficulty. He reported that his shoulder felt better upon the conclusion of treatment. Recommend that he continue with his current plan of care to address his remaining impairments to maximize his functional mobility.   OBJECTIVE IMPAIRMENTS: Abnormal gait, decreased activity tolerance, difficulty walking, decreased ROM, decreased strength, impaired sensation, impaired UE functional use, and pain.   ACTIVITY LIMITATIONS: carrying, lifting, reach over head, and locomotion level  PARTICIPATION LIMITATIONS: meal prep, cleaning, laundry, shopping, community activity, and yard work  PERSONAL FACTORS: Past/current experiences, Time since onset of injury/illness/exacerbation, Transportation, and 3+ comorbidities: peripheral artery disease, history of a CVA, and history of a seizure are also affecting patient's functional outcome.   REHAB POTENTIAL: Good  CLINICAL DECISION MAKING: Evolving/moderate complexity  EVALUATION COMPLEXITY: Moderate  PLAN:  PT FREQUENCY: 2x/week  PT DURATION: 6 weeks  PLANNED INTERVENTIONS: 97164- PT Re-evaluation, 97110-Therapeutic exercises, 97530- Therapeutic activity, [id number]- Neuromuscular re-education, 97535- Self Care, [id number]- Manual therapy, [id number]- Gait training, [phone]- Electrical stimulation (unattended), Patient/Family education, Balance training,  Cryotherapy, and Moist heat  PLAN FOR NEXT SESSION:  Nustep, UBE, continue upper and lower extremity strength LT , continue balance interventions, pulleys, AAROM, and gait training.    Lane Pinon, PT 04/14/2024, 3:24 PM

## 2024-04-20 ENCOUNTER — Ambulatory Visit

## 2024-04-20 DIAGNOSIS — I63511 Cerebral infarction due to unspecified occlusion or stenosis of right middle cerebral artery: Secondary | ICD-10-CM

## 2024-04-20 DIAGNOSIS — M6281 Muscle weakness (generalized): Secondary | ICD-10-CM

## 2024-04-20 NOTE — Therapy (Signed)
 OUTPATIENT PHYSICAL THERAPY NEURO TREATMENT   Patient Name: Ian Wong MRN: [id number] DOB:1961/02/15, 63 y.o., male Today's Date: 04/20/2024  REFERRING PROVIDER: Genetta Kenning, MD   END OF SESSION:  PT End of Session - 04/20/24 1403     Visit Number 10    Number of Visits 12    Date for PT Re-Evaluation 05/27/24    PT Start Time 1347    PT Stop Time 1429    PT Time Calculation (min) 42 min    Activity Tolerance Patient tolerated treatment well    Behavior During Therapy [facility] for tasks assessed/performed                Past Medical History:  Diagnosis Date   GERD (gastroesophageal reflux disease)    Hypothyroidism    Seizures (HCC)    Past Surgical History:  Procedure Laterality Date   AORTA - BILATERAL FEMORAL ARTERY BYPASS GRAFT Bilateral 01/07/2017   Procedure: AORTOBIFEMORAL BYPASS GRAFT;  Surgeon: Mayo Speck, MD;  Location: [facility] OR;  Service: Vascular;  Laterality: Bilateral;   I & D EXTREMITY Left 11/28/2014   Procedure: IRRIGATION AND DEBRIDEMENT;  REPAIR OF CRUSH INJURY;  Surgeon: Brunilda Capra, MD;  Location: [facility] OR;  Service: Orthopedics;  Laterality: Left;   INCISIONAL HERNIA REPAIR N/A 10/26/2017   Procedure: HERNIA REPAIR INCISIONAL WITH MESH;  Surgeon: Alanda Allegra, MD;  Location: [facility] ORS;  Service: General;  Laterality: N/A;   IR CT HEAD LTD  11/30/2023   IR INTRAVSC STENT CERV CAROTID W/EMB-PROT MOD SED INCL ANGIO  11/30/2023   IR PERCUTANEOUS ART THROMBECTOMY/INFUSION INTRACRANIAL INC DIAG ANGIO  11/30/2023   IR RADIOLOGIST EVAL & MGMT  03/28/2024   IR US  GUIDE VASC ACCESS RIGHT  11/30/2023   RADIOLOGY WITH ANESTHESIA N/A 11/30/2023   Procedure: IR WITH ANESTHESIA;  Surgeon: Radiologist, Medication, MD;  Location: [facility] OR;  Service: Radiology;  Laterality: N/A;   Patient Active Problem List   Diagnosis Date Noted   Dysphagia, post-stroke 12/16/2023   Spastic hemiparesis 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 (HCC)  12/16/2023   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   Acute ischemic right MCA stroke (HCC) 11/30/2023   Acute right MCA stroke (HCC) 11/30/2023   Acute hypoxic respiratory failure (HCC) 11/30/2023   Acute metabolic encephalopathy 11/30/2023   Aspiration pneumonia (HCC) 11/30/2023   Incisional hernia, without obstruction or gangrene    Aortoiliac occlusive disease (HCC) 01/07/2017   Leriche syndrome (HCC)    PAD (peripheral artery disease) (HCC) 01/03/2017   Critical lower limb ischemia (HCC) 01/03/2017   Cellulitis 12/29/2016   Cellulitis of left lower extremity     ONSET DATE: November 30, 2023  REFERRING DIAG: Right middle cerebral artery stroke, Left spastic hemiparesis, Shoulder impingement syndrome, left   THERAPY DIAG:  Right middle cerebral artery stroke (HCC)  Muscle weakness (generalized)  Rationale for Evaluation and Treatment: Rehabilitation  SUBJECTIVE:  SUBJECTIVE STATEMENT:  Patient reports that he feels good today.   Pt accompanied by: self  PERTINENT HISTORY: peripheral artery disease, history of a CVA, and history of a seizure  PAIN:  Are you having pain? Yes: NPRS scale: 5/10 Pain location: left shoulder Pain description: intermittent, "hurts" Aggravating factors: raising his arm  Relieving factors: rest and medication  PRECAUTIONS: Fall  RED FLAGS: None   WEIGHT BEARING RESTRICTIONS: No  FALLS: Has patient fallen in last 6 months? Yes. Number of falls 1  LIVING ENVIRONMENT: Lives with: lives with their family Lives in: House/apartment Stairs: Yes, but he primarily uses a ramp to enter and exit his home Has following equipment at home: shower chair, Grab bars, and Ramped entry  PLOF: Independent  PATIENT GOALS: improved arm  mobility  OBJECTIVE:  Note: Objective measures were completed at Evaluation unless otherwise noted.  DIAGNOSTIC FINDINGS: 01/06/24 IMPRESSION: 1. No acute traumatic injury identified. Chronic left lamina papyracea fracture. 2. Expected evolution of large Right MCA territory infarct since December with developing encephalomalacia. No acute intracranial abnormality. COGNITION: Overall cognitive status: Within functional limits for tasks assessed   SENSATION: Patient reports numbness in his left hand since his stroke in December 2024  COORDINATION: No significant deficits observed  UPPER EXTREMITY ROM:  Active ROM Right eval Left eval  Shoulder flexion 142 72; painful  Shoulder extension    Shoulder abduction 120 70; painful   Shoulder adduction    Shoulder extension    Shoulder internal rotation To L2 To sacrum  Shoulder external rotation To T3 To left ear  Elbow flexion    Elbow extension    Wrist flexion    Wrist extension    Wrist ulnar deviation    Wrist radial deviation    Wrist pronation    Wrist supination     (Blank rows = not tested)   UPPER EXTREMITY MMT:  MMT Right eval Left eval  Shoulder flexion 4+/5   Shoulder extension    Shoulder abduction 5/5   Shoulder adduction    Shoulder extension    Shoulder internal rotation 4/5   Shoulder external rotation 4+/5   Middle trapezius    Lower trapezius    Elbow flexion    Elbow extension    Wrist flexion    Wrist extension    Wrist ulnar deviation    Wrist radial deviation    Wrist pronation    Wrist supination    Grip strength 97 15   (Blank rows = not tested)   LOWER EXTREMITY MMT:    MMT Right Eval Left Eval  Hip flexion 4/5 3+/5  Hip extension    Hip abduction    Hip adduction    Hip internal rotation    Hip external rotation    Knee flexion 5/5 4+/5  Knee extension 5/5 4+/5  Ankle dorsiflexion 4+/5 4/5  Ankle plantarflexion    Ankle inversion    Ankle eversion    (Blank rows =  not tested)  TRANSFERS: Assistive device utilized: None  Sit to stand: Complete Independence Stand to sit: Complete Independence  GAIT: Gait pattern: step to pattern and shuffling Assistive device utilized: None Level of assistance: SBA  FUNCTIONAL TESTS:  5 times sit to stand: 13.88 seconds without UE support  TREATMENT DATE:                                     04/20/24 EXERCISE LOG  Exercise Repetitions and Resistance Comments  Nustep  L4 x 16 minutes   Pulleys  2 minutes   Isometric ball press  3 minutes w/ 3 second hold   Ball roll out  2.5 minutes  For horizontal ABD and ADD   AA shoulder ER  2 minutes  With cane  Cybex knee extension 30# x 2 minutes   Cybex knee flexion  50# x 2 minutes   Isometric ball squeeze  2 minutes w/ 5 second hold  For shoulder IR  Sit to stand  20 reps     Blank cell = exercise not performed today                                    04/14/24 EXERCISE LOG  Exercise Repetitions and Resistance Comments  Nustep L4 x 11 minutes   Pulleys  2 minutes  Flexion  L shoulder assessment AROM and grip strength   Cybex knee flexion 50# x 2 minutes   Cybex knee extension 30# x 2 x 1 minute    Ball roll out  3 minutes For shoulder flexion  Isometric ball press  3 minutes w/ 3 second hold   Ball circles  3 minutes  CW and CCW w/ ball on the table  Isometric ball squeeze  3 minutes w/ 3 second hold  For shoulder IR   Blank cell = exercise not performed today    04/08/14                                 EXERCISE LOG   LT side  Exercise Repetitions and Resistance Comments  Nustep  Lvl 4 x 10 mins   Pulleys    5 STS    Cybex Knee Flexion 30# x 3.5 mins   Cybex Knee Extension 20# x 3.5 mins   Cybex Leg Pres    Rocker board  X 4 mins   tandem stance X 4 with each foot forward   Ball/wall attempted To challenging  Ball on table CW/CCW circles 3x10  each way Added to HEP   Blank cell = exercise not performed today  Reviewed HEP pulleys and tband exs, and wts  PATIENT EDUCATION: Education details: POC, healing, objective findings, benefits of therapy, and goals for physical therapy  Person educated: Patient Education method: Explanation Education comprehension: verbalized understanding  HOME EXERCISE PROGRAM: [id number]  GOALS: Goals reviewed with patient? Yes  SHORT TERM GOALS: Target date: 03/23/24  Patient will be independent with his initial HEP.  Baseline: Goal status: MET  2.  Patient will be able to demonstrate at least 90 degrees of left shoulder flexion for improved function picking up items from his cabinet.  Baseline: 5/5: 91 degrees 04/14/24: 95 degrees Goal status: MET  3.  Patient will be able to demonstrate at least 90 degrees of left shoulder abduction for improved function reaching.  Baseline: 75 degrees 04/14/24: 71 degrees Goal status: IN PROGRESS  4.  Patient will improve his left hand grip strength to at least 25 pounds for improved function grasping items.  Baseline: 5/5: 15# 04/14/24: 10# Goal  status: IN PROGRESS  LONG TERM GOALS: Target date: 04/13/24  Patient will be independent with his advanced HEP.  Baseline:  Goal status: IN PROGRESS  2.  Patient will improve his five time sit to stand time to 12 seconds or less to reduce his fall risk.  Baseline: 5/5: 11.7 seconds Goal status: MET  3.  Patient will be able to demonstrate at least 120 degrees of left shoulder flexion for improved function washing his hair.  Baseline: see short term goals Goal status: IN PROGRESS  4.  Patient will be able to demonstrate at least 120 degrees of left shoulder abduction for improved function reaching overhead.  Baseline: see short term goals Goal status: IN PROGRESS  5.  Patient will improve his left hand grip strength to at least 40 pounds for improved function carrying groceries.  Baseline: see short  term goals Goal status: IN PROGRESS  ASSESSMENT:  CLINICAL IMPRESSION:   Patient was progressed with multiple new and familiar interventions for improved rotator cuff engagement to facilitate improved left shoulder mobility. He required moderate verbal and tactile cueing with active assisted external rotation to limit trunk rotation to promote left shoulder mobility. He experienced a mild increase in upper extremity fatigue with today's interventions which resulted in treatment being transitioned to lower extremity interventions. He reported feeling a little sore upon the conclusion of treatment. He continues to require skilled physical therapy to address her remaining impairments to maximize his functional mobility.   OBJECTIVE IMPAIRMENTS: Abnormal gait, decreased activity tolerance, difficulty walking, decreased ROM, decreased strength, impaired sensation, impaired UE functional use, and pain.   ACTIVITY LIMITATIONS: carrying, lifting, reach over head, and locomotion level  PARTICIPATION LIMITATIONS: meal prep, cleaning, laundry, shopping, community activity, and yard work  PERSONAL FACTORS: Past/current experiences, Time since onset of injury/illness/exacerbation, Transportation, and 3+ comorbidities: peripheral artery disease, history of a CVA, and history of a seizure are also affecting patient's functional outcome.   REHAB POTENTIAL: Good  CLINICAL DECISION MAKING: Evolving/moderate complexity  EVALUATION COMPLEXITY: Moderate  PLAN:  PT FREQUENCY: 2x/week  PT DURATION: 6 weeks  PLANNED INTERVENTIONS: 97164- PT Re-evaluation, 97110-Therapeutic exercises, 97530- Therapeutic activity, [id number]- Neuromuscular re-education, 97535- Self Care, [id number]- Manual therapy, [id number]- Gait training, [phone]- Electrical stimulation (unattended), Patient/Family education, Balance training, Cryotherapy, and Moist heat  PLAN FOR NEXT SESSION:  Nustep, UBE, continue upper and lower extremity strength LT ,  continue balance interventions, pulleys, AAROM, and gait training.    Lane Pinon, PT 04/20/2024, 2:52 PM

## 2024-04-22 ENCOUNTER — Ambulatory Visit

## 2024-04-22 DIAGNOSIS — I63511 Cerebral infarction due to unspecified occlusion or stenosis of right middle cerebral artery: Secondary | ICD-10-CM

## 2024-04-22 DIAGNOSIS — M6281 Muscle weakness (generalized): Secondary | ICD-10-CM

## 2024-04-22 NOTE — Therapy (Signed)
 OUTPATIENT PHYSICAL THERAPY NEURO TREATMENT   Patient Name: Ian Wong MRN: [id number] DOB:October 04, 1961, 63 y.o., male Today's Date: 04/22/2024  REFERRING PROVIDER: Genetta Kenning, MD   END OF SESSION:  PT End of Session - 04/22/24 1150     Visit Number 11    Number of Visits 12    Date for PT Re-Evaluation 05/27/24    PT Start Time 1147    PT Stop Time 1230    PT Time Calculation (min) 43 min    Activity Tolerance Patient tolerated treatment well    Behavior During Therapy [facility] for tasks assessed/performed                 Past Medical History:  Diagnosis Date   GERD (gastroesophageal reflux disease)    Hypothyroidism    Seizures (HCC)    Past Surgical History:  Procedure Laterality Date   AORTA - BILATERAL FEMORAL ARTERY BYPASS GRAFT Bilateral 01/07/2017   Procedure: AORTOBIFEMORAL BYPASS GRAFT;  Surgeon: Mayo Speck, MD;  Location: [facility] OR;  Service: Vascular;  Laterality: Bilateral;   I & D EXTREMITY Left 11/28/2014   Procedure: IRRIGATION AND DEBRIDEMENT;  REPAIR OF CRUSH INJURY;  Surgeon: Brunilda Capra, MD;  Location: [facility] OR;  Service: Orthopedics;  Laterality: Left;   INCISIONAL HERNIA REPAIR N/A 10/26/2017   Procedure: HERNIA REPAIR INCISIONAL WITH MESH;  Surgeon: Alanda Allegra, MD;  Location: [facility] ORS;  Service: General;  Laterality: N/A;   IR CT HEAD LTD  11/30/2023   IR INTRAVSC STENT CERV CAROTID W/EMB-PROT MOD SED INCL ANGIO  11/30/2023   IR PERCUTANEOUS ART THROMBECTOMY/INFUSION INTRACRANIAL INC DIAG ANGIO  11/30/2023   IR RADIOLOGIST EVAL & MGMT  03/28/2024   IR US  GUIDE VASC ACCESS RIGHT  11/30/2023   RADIOLOGY WITH ANESTHESIA N/A 11/30/2023   Procedure: IR WITH ANESTHESIA;  Surgeon: Radiologist, Medication, MD;  Location: [facility] OR;  Service: Radiology;  Laterality: N/A;   Patient Active Problem List   Diagnosis Date Noted   Dysphagia, post-stroke 12/16/2023   Spastic hemiparesis 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 (HCC)  12/16/2023   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   Acute ischemic right MCA stroke (HCC) 11/30/2023   Acute right MCA stroke (HCC) 11/30/2023   Acute hypoxic respiratory failure (HCC) 11/30/2023   Acute metabolic encephalopathy 11/30/2023   Aspiration pneumonia (HCC) 11/30/2023   Incisional hernia, without obstruction or gangrene    Aortoiliac occlusive disease (HCC) 01/07/2017   Leriche syndrome (HCC)    PAD (peripheral artery disease) (HCC) 01/03/2017   Critical lower limb ischemia (HCC) 01/03/2017   Cellulitis 12/29/2016   Cellulitis of left lower extremity     ONSET DATE: November 30, 2023  REFERRING DIAG: Right middle cerebral artery stroke, Left spastic hemiparesis, Shoulder impingement syndrome, left   THERAPY DIAG:  Right middle cerebral artery stroke (HCC)  Muscle weakness (generalized)  Rationale for Evaluation and Treatment: Rehabilitation  SUBJECTIVE:  SUBJECTIVE STATEMENT:  Patient reports that he feels good today. He feels that his legs are a lot better, but his shoulder is still hurting.   Pt accompanied by: self  PERTINENT HISTORY: peripheral artery disease, history of a CVA, and history of a seizure  PAIN:  Are you having pain? Yes: NPRS scale: no pain score provided Pain location: left shoulder Pain description: intermittent, "hurts" Aggravating factors: raising his arm  Relieving factors: rest and medication  PRECAUTIONS: Fall  RED FLAGS: None   WEIGHT BEARING RESTRICTIONS: No  FALLS: Has patient fallen in last 6 months? Yes. Number of falls 1  LIVING ENVIRONMENT: Lives with: lives with their family Lives in: House/apartment Stairs: Yes, but he primarily uses a ramp to enter and exit his home Has following equipment at home: shower chair,  Grab bars, and Ramped entry  PLOF: Independent  PATIENT GOALS: improved arm mobility  OBJECTIVE:  Note: Objective measures were completed at Evaluation unless otherwise noted.  DIAGNOSTIC FINDINGS: 01/06/24 IMPRESSION: 1. No acute traumatic injury identified. Chronic left lamina papyracea fracture. 2. Expected evolution of large Right MCA territory infarct since December with developing encephalomalacia. No acute intracranial abnormality. COGNITION: Overall cognitive status: Within functional limits for tasks assessed   SENSATION: Patient reports numbness in his left hand since his stroke in December 2024  COORDINATION: No significant deficits observed  UPPER EXTREMITY ROM:  Active ROM Right eval Left eval  Shoulder flexion 142 72; painful  Shoulder extension    Shoulder abduction 120 70; painful   Shoulder adduction    Shoulder extension    Shoulder internal rotation To L2 To sacrum  Shoulder external rotation To T3 To left ear  Elbow flexion    Elbow extension    Wrist flexion    Wrist extension    Wrist ulnar deviation    Wrist radial deviation    Wrist pronation    Wrist supination     (Blank rows = not tested)   UPPER EXTREMITY MMT:  MMT Right eval Left eval  Shoulder flexion 4+/5   Shoulder extension    Shoulder abduction 5/5   Shoulder adduction    Shoulder extension    Shoulder internal rotation 4/5   Shoulder external rotation 4+/5   Middle trapezius    Lower trapezius    Elbow flexion    Elbow extension    Wrist flexion    Wrist extension    Wrist ulnar deviation    Wrist radial deviation    Wrist pronation    Wrist supination    Grip strength 97 15   (Blank rows = not tested)   LOWER EXTREMITY MMT:    MMT Right Eval Left Eval  Hip flexion 4/5 3+/5  Hip extension    Hip abduction    Hip adduction    Hip internal rotation    Hip external rotation    Knee flexion 5/5 4+/5  Knee extension 5/5 4+/5  Ankle dorsiflexion 4+/5 4/5   Ankle plantarflexion    Ankle inversion    Ankle eversion    (Blank rows = not tested)  TRANSFERS: Assistive device utilized: None  Sit to stand: Complete Independence Stand to sit: Complete Independence  GAIT: Gait pattern: step to pattern and shuffling Assistive device utilized: None Level of assistance: SBA  FUNCTIONAL TESTS:  5 times sit to stand: 13.88 seconds without UE support  TREATMENT DATE:                                     04/22/24 EXERCISE LOG  Exercise Repetitions and Resistance Comments  Nustep  L4 x 15.5 minutes   Cybex knee flexion  40# x 2.5 minutes   Cybex knee extension  20# x 2.5 minutes   Side stepping on foam 3 minutes BUE support from parallel bars  Rocker board  5 minutes  BUE support from parallel bars   Blank cell = exercise not performed today                                    04/20/24 EXERCISE LOG  Exercise Repetitions and Resistance Comments  Nustep  L4 x 16 minutes   Pulleys  2 minutes   Isometric ball press  3 minutes w/ 3 second hold   Ball roll out  2.5 minutes  For horizontal ABD and ADD   AA shoulder ER  2 minutes  With cane  Cybex knee extension 30# x 2 minutes   Cybex knee flexion  50# x 2 minutes   Isometric ball squeeze  2 minutes w/ 5 second hold  For shoulder IR  Sit to stand  20 reps     Blank cell = exercise not performed today                                    04/14/24 EXERCISE LOG  Exercise Repetitions and Resistance Comments  Nustep L4 x 11 minutes   Pulleys  2 minutes  Flexion  L shoulder assessment AROM and grip strength   Cybex knee flexion 50# x 2 minutes   Cybex knee extension 30# x 2 x 1 minute    Ball roll out  3 minutes For shoulder flexion  Isometric ball press  3 minutes w/ 3 second hold   Ball circles  3 minutes  CW and CCW w/ ball on the table  Isometric ball squeeze  3 minutes w/ 3 second  hold  For shoulder IR   Blank cell = exercise not performed today   PATIENT EDUCATION: Education details: POC Person educated: Patient Education method: Explanation Education comprehension: verbalized understanding  HOME EXERCISE PROGRAM: [id number]  GOALS: Goals reviewed with patient? Yes  SHORT TERM GOALS: Target date: 03/23/24  Patient will be independent with his initial HEP.  Baseline: Goal status: MET  2.  Patient will be able to demonstrate at least 90 degrees of left shoulder flexion for improved function picking up items from his cabinet.  Baseline: 5/5: 91 degrees 04/14/24: 95 degrees Goal status: MET  3.  Patient will be able to demonstrate at least 90 degrees of left shoulder abduction for improved function reaching.  Baseline: 75 degrees 04/14/24: 71 degrees Goal status: IN PROGRESS  4.  Patient will improve his left hand grip strength to at least 25 pounds for improved function grasping items.  Baseline: 5/5: 15# 04/14/24: 10# Goal status: IN PROGRESS  LONG TERM GOALS: Target date: 04/13/24  Patient will be independent with his advanced HEP.  Baseline:  Goal status: IN PROGRESS  2.  Patient will improve his five time sit to stand time to 12 seconds or less to reduce his fall risk.  Baseline: 5/5: 11.7 seconds Goal status: MET  3.  Patient will be able to demonstrate at least 120 degrees of left shoulder flexion for improved function washing his hair.  Baseline: see short term goals Goal status: IN PROGRESS  4.  Patient will be able to demonstrate at least 120 degrees of left shoulder abduction for improved function reaching overhead.  Baseline: see short term goals Goal status: IN PROGRESS  5.  Patient will improve his left hand grip strength to at least 40 pounds for improved function carrying groceries.  Baseline: see short term goals Goal status: IN PROGRESS  ASSESSMENT:  CLINICAL IMPRESSION:   Today's treatment focused on lower extremity  interventions due to his right shoulder pain. She required minimal cueing with today's interventions for proper pacing to facilitate increased lower extremity demand. He reported feeling alright upon the conclusion of treatment. He continues to require skilled physical therapy to address his remaining impairments to maximize his functional mobility.   OBJECTIVE IMPAIRMENTS: Abnormal gait, decreased activity tolerance, difficulty walking, decreased ROM, decreased strength, impaired sensation, impaired UE functional use, and pain.   ACTIVITY LIMITATIONS: carrying, lifting, reach over head, and locomotion level  PARTICIPATION LIMITATIONS: meal prep, cleaning, laundry, shopping, community activity, and yard work  PERSONAL FACTORS: Past/current experiences, Time since onset of injury/illness/exacerbation, Transportation, and 3+ comorbidities: peripheral artery disease, history of a CVA, and history of a seizure are also affecting patient's functional outcome.   REHAB POTENTIAL: Good  CLINICAL DECISION MAKING: Evolving/moderate complexity  EVALUATION COMPLEXITY: Moderate  PLAN:  PT FREQUENCY: 2x/week  PT DURATION: 6 weeks  PLANNED INTERVENTIONS: 97164- PT Re-evaluation, 97110-Therapeutic exercises, 97530- Therapeutic activity, [id number]- Neuromuscular re-education, 97535- Self Care, 16109- Manual therapy, [id number]- Gait training, [phone]- Electrical stimulation (unattended), Patient/Family education, Balance training, Cryotherapy, and Moist heat  PLAN FOR NEXT SESSION:  Nustep, UBE, continue upper and lower extremity strength LT , continue balance interventions, pulleys, AAROM, and gait training.    Lane Pinon, PT 04/22/2024, 1:07 PM

## 2024-04-27 ENCOUNTER — Ambulatory Visit

## 2024-04-27 DIAGNOSIS — M6281 Muscle weakness (generalized): Secondary | ICD-10-CM

## 2024-04-27 DIAGNOSIS — I63511 Cerebral infarction due to unspecified occlusion or stenosis of right middle cerebral artery: Secondary | ICD-10-CM | POA: Diagnosis not present

## 2024-04-27 NOTE — Therapy (Signed)
 OUTPATIENT PHYSICAL THERAPY NEURO TREATMENT   Patient Name: Breydon Senters MRN: [id number] DOB:1961/01/16, 63 y.o., male Today's Date: 04/27/2024  REFERRING PROVIDER: Genetta Kenning, MD   END OF SESSION:  PT End of Session - 04/27/24 1348     Visit Number 12    Number of Visits 12    Date for PT Re-Evaluation 05/27/24    PT Start Time 1345    Activity Tolerance Patient tolerated treatment well    Behavior During Therapy [facility] for tasks assessed/performed                 Past Medical History:  Diagnosis Date   GERD (gastroesophageal reflux disease)    Hypothyroidism    Seizures (HCC)    Past Surgical History:  Procedure Laterality Date   AORTA - BILATERAL FEMORAL ARTERY BYPASS GRAFT Bilateral 01/07/2017   Procedure: AORTOBIFEMORAL BYPASS GRAFT;  Surgeon: Mayo Speck, MD;  Location: [facility] OR;  Service: Vascular;  Laterality: Bilateral;   I & D EXTREMITY Left 11/28/2014   Procedure: IRRIGATION AND DEBRIDEMENT;  REPAIR OF CRUSH INJURY;  Surgeon: Brunilda Capra, MD;  Location: [facility] OR;  Service: Orthopedics;  Laterality: Left;   INCISIONAL HERNIA REPAIR N/A 10/26/2017   Procedure: HERNIA REPAIR INCISIONAL WITH MESH;  Surgeon: Alanda Allegra, MD;  Location: [facility] ORS;  Service: General;  Laterality: N/A;   IR CT HEAD LTD  11/30/2023   IR INTRAVSC STENT CERV CAROTID W/EMB-PROT MOD SED INCL ANGIO  11/30/2023   IR PERCUTANEOUS ART THROMBECTOMY/INFUSION INTRACRANIAL INC DIAG ANGIO  11/30/2023   IR RADIOLOGIST EVAL & MGMT  03/28/2024   IR US  GUIDE VASC ACCESS RIGHT  11/30/2023   RADIOLOGY WITH ANESTHESIA N/A 11/30/2023   Procedure: IR WITH ANESTHESIA;  Surgeon: Radiologist, Medication, MD;  Location: [facility] OR;  Service: Radiology;  Laterality: N/A;   Patient Active Problem List   Diagnosis Date Noted   Dysphagia, post-stroke 12/16/2023   Spastic hemiparesis 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 (HCC) 12/16/2023   Right middle cerebral artery stroke (HCC)  12/11/2023   Acute ischemic right MCA stroke (HCC) 11/30/2023   Acute right MCA stroke (HCC) 11/30/2023   Acute hypoxic respiratory failure (HCC) 11/30/2023   Acute metabolic encephalopathy 11/30/2023   Aspiration pneumonia (HCC) 11/30/2023   Incisional hernia, without obstruction or gangrene    Aortoiliac occlusive disease (HCC) 01/07/2017   Leriche syndrome (HCC)    PAD (peripheral artery disease) (HCC) 01/03/2017   Critical lower limb ischemia (HCC) 01/03/2017   Cellulitis 12/29/2016   Cellulitis of left lower extremity     ONSET DATE: November 30, 2023  REFERRING DIAG: Right middle cerebral artery stroke, Left spastic hemiparesis, Shoulder impingement syndrome, left   THERAPY DIAG:  Right middle cerebral artery stroke (HCC)  Muscle weakness (generalized)  Rationale for Evaluation and Treatment: Rehabilitation  SUBJECTIVE:  SUBJECTIVE STATEMENT:  Patient reports that he feels good today. He feels that his legs are a lot better, but his shoulder is still hurting.   Pt accompanied by: self  PERTINENT HISTORY: peripheral artery disease, history of a CVA, and history of a seizure  PAIN:  Are you having pain? Yes: NPRS scale: no pain score provided Pain location: left shoulder Pain description: intermittent, "hurts" Aggravating factors: raising his arm  Relieving factors: rest and medication  PRECAUTIONS: Fall  RED FLAGS: None   WEIGHT BEARING RESTRICTIONS: No  FALLS: Has patient fallen in last 6 months? Yes. Number of falls 1  LIVING ENVIRONMENT: Lives with: lives with their family Lives in: House/apartment Stairs: Yes, but he primarily uses a ramp to enter and exit his home Has following equipment at home: shower chair, Grab bars, and Ramped entry  PLOF: Independent  PATIENT  GOALS: improved arm mobility  OBJECTIVE:  Note: Objective measures were completed at Evaluation unless otherwise noted.  DIAGNOSTIC FINDINGS: 01/06/24 IMPRESSION: 1. No acute traumatic injury identified. Chronic left lamina papyracea fracture. 2. Expected evolution of large Right MCA territory infarct since December with developing encephalomalacia. No acute intracranial abnormality. COGNITION: Overall cognitive status: Within functional limits for tasks assessed   SENSATION: Patient reports numbness in his left hand since his stroke in December 2024  COORDINATION: No significant deficits observed  UPPER EXTREMITY ROM:  Active ROM Right eval Left eval  Shoulder flexion 142 72; painful  Shoulder extension    Shoulder abduction 120 70; painful   Shoulder adduction    Shoulder extension    Shoulder internal rotation To L2 To sacrum  Shoulder external rotation To T3 To left ear  Elbow flexion    Elbow extension    Wrist flexion    Wrist extension    Wrist ulnar deviation    Wrist radial deviation    Wrist pronation    Wrist supination     (Blank rows = not tested)   UPPER EXTREMITY MMT:  MMT Right eval Left eval  Shoulder flexion 4+/5   Shoulder extension    Shoulder abduction 5/5   Shoulder adduction    Shoulder extension    Shoulder internal rotation 4/5   Shoulder external rotation 4+/5   Middle trapezius    Lower trapezius    Elbow flexion    Elbow extension    Wrist flexion    Wrist extension    Wrist ulnar deviation    Wrist radial deviation    Wrist pronation    Wrist supination    Grip strength 97 15   (Blank rows = not tested)   LOWER EXTREMITY MMT:    MMT Right Eval Left Eval  Hip flexion 4/5 3+/5  Hip extension    Hip abduction    Hip adduction    Hip internal rotation    Hip external rotation    Knee flexion 5/5 4+/5  Knee extension 5/5 4+/5  Ankle dorsiflexion 4+/5 4/5  Ankle plantarflexion    Ankle inversion    Ankle eversion     (Blank rows = not tested)  TRANSFERS: Assistive device utilized: None  Sit to stand: Complete Independence Stand to sit: Complete Independence  GAIT: Gait pattern: step to pattern and shuffling Assistive device utilized: None Level of assistance: SBA  FUNCTIONAL TESTS:  5 times sit to stand: 13.88 seconds without UE support  TREATMENT DATE:    04/27/24     EXERCISE LOG  Exercise Repetitions and Resistance Comments  Recumbent Bike  Lvl 2 x 15.5 minutes   Pulleys 5 mins   Cybex knee flexion     Cybex knee extension     Side stepping on foam  BUE support from parallel bars  Rocker board   BUE support from parallel bars   Blank cell = exercise not performed today   Manual Therapy Soft Tissue Mobilization: left shoulder, STW/M to left middle deltoid and bicep to decrease pain and tone   Modalities  Date:  Unattended Estim: Shoulder, IFC 80-150 Hz, 15 mins, Pain and Tone Hot Pack: Shoulder, 15 mins, Pain and Tone                                    04/20/24 EXERCISE LOG  Exercise Repetitions and Resistance Comments  Nustep  L4 x 16 minutes   Pulleys  2 minutes   Isometric ball press  3 minutes w/ 3 second hold   Ball roll out  2.5 minutes  For horizontal ABD and ADD   AA shoulder ER  2 minutes  With cane  Cybex knee extension 30# x 2 minutes   Cybex knee flexion  50# x 2 minutes   Isometric ball squeeze  2 minutes w/ 5 second hold  For shoulder IR  Sit to stand  20 reps     Blank cell = exercise not performed today                                    04/14/24 EXERCISE LOG  Exercise Repetitions and Resistance Comments  Nustep L4 x 11 minutes   Pulleys  2 minutes  Flexion  L shoulder assessment AROM and grip strength   Cybex knee flexion 50# x 2 minutes   Cybex knee extension 30# x 2 x 1 minute    Ball roll out  3 minutes For shoulder flexion  Isometric ball  press  3 minutes w/ 3 second hold   Ball circles  3 minutes  CW and CCW w/ ball on the table  Isometric ball squeeze  3 minutes w/ 3 second hold  For shoulder IR   Blank cell = exercise not performed today   PATIENT EDUCATION: Education details: POC Person educated: Patient Education method: Explanation Education comprehension: verbalized understanding  HOME EXERCISE PROGRAM: [id number]  GOALS: Goals reviewed with patient? Yes  SHORT TERM GOALS: Target date: 03/23/24  Patient will be independent with his initial HEP.  Baseline: Goal status: MET  2.  Patient will be able to demonstrate at least 90 degrees of left shoulder flexion for improved function picking up items from his cabinet.  Baseline: 5/5: 91 degrees 04/14/24: 95 degrees Goal status: MET  3.  Patient will be able to demonstrate at least 90 degrees of left shoulder abduction for improved function reaching.  Baseline: 75 degrees 04/14/24: 71 degrees; 5/28: 96 degrees (AA) Goal status: MET  4.  Patient will improve his left hand grip strength to at least 25 pounds for improved function grasping items.  Baseline: 5/5: 15# 04/14/24: 10# Goal status: IN PROGRESS  LONG TERM GOALS: Target date: 04/13/24  Patient will be independent with his advanced HEP.  Baseline:  Goal status: IN PROGRESS  2.  Patient will  improve his five time sit to stand time to 12 seconds or less to reduce his fall risk.  Baseline: 5/5: 11.7 seconds Goal status: MET  3.  Patient will be able to demonstrate at least 120 degrees of left shoulder flexion for improved function washing his hair.  Baseline: see short term goals Goal status: IN PROGRESS  4.  Patient will be able to demonstrate at least 120 degrees of left shoulder abduction for improved function reaching overhead.  Baseline: see short term goals Goal status: IN PROGRESS  5.  Patient will improve his left hand grip strength to at least 40 pounds for improved function carrying  groceries.  Baseline: see short term goals Goal status: IN PROGRESS  ASSESSMENT:  CLINICAL IMPRESSION:   Pt arrives for today's treatment session reporting 5/10 left shoulder pain today.  Pt able to perform 100 degrees of left shoulder flexion and 96 degrees of AA left shoulder flexion, meeting his short term goals and making good progress towards his long term goals.  Pt left hand grip strength remains at 10# at this point in time.  STW/M performed to left deltoid and bicep to decrease pain and tone.  Normal responses to estim and MH noted upon removal.  Pt reported 3/10 left shoulder pain at completion of today's treatment session.  OBJECTIVE IMPAIRMENTS: Abnormal gait, decreased activity tolerance, difficulty walking, decreased ROM, decreased strength, impaired sensation, impaired UE functional use, and pain.   ACTIVITY LIMITATIONS: carrying, lifting, reach over head, and locomotion level  PARTICIPATION LIMITATIONS: meal prep, cleaning, laundry, shopping, community activity, and yard work  PERSONAL FACTORS: Past/current experiences, Time since onset of injury/illness/exacerbation, Transportation, and 3+ comorbidities: peripheral artery disease, history of a CVA, and history of a seizure are also affecting patient's functional outcome.   REHAB POTENTIAL: Good  CLINICAL DECISION MAKING: Evolving/moderate complexity  EVALUATION COMPLEXITY: Moderate  PLAN:  PT FREQUENCY: 2x/week  PT DURATION: 6 weeks  PLANNED INTERVENTIONS: 97164- PT Re-evaluation, 97110-Therapeutic exercises, 97530- Therapeutic activity, [id number]- Neuromuscular re-education, 97535- Self Care, [id number]- Manual therapy, [id number]- Gait training, [phone]- Electrical stimulation (unattended), Patient/Family education, Balance training, Cryotherapy, and Moist heat  PLAN FOR NEXT SESSION:  Nustep, UBE, continue upper and lower extremity strength LT , continue balance interventions, pulleys, AAROM, and gait training.    Deryl Flora, PTA 04/27/2024, 1:48 PM

## 2024-04-28 NOTE — Addendum Note (Signed)
 Addended by: Lane Pinon on: 04/28/2024 08:19 AM   Modules accepted: Orders

## 2024-05-02 ENCOUNTER — Ambulatory Visit: Attending: Physical Medicine & Rehabilitation

## 2024-05-02 DIAGNOSIS — I63511 Cerebral infarction due to unspecified occlusion or stenosis of right middle cerebral artery: Secondary | ICD-10-CM | POA: Diagnosis present

## 2024-05-02 DIAGNOSIS — M6281 Muscle weakness (generalized): Secondary | ICD-10-CM | POA: Diagnosis present

## 2024-05-02 NOTE — Therapy (Signed)
 OUTPATIENT PHYSICAL THERAPY NEURO TREATMENT   Patient Name: Ian Wong MRN: [id number] DOB:Apr 20, 1961, 63 y.o., male Today's Date: 05/02/2024  REFERRING PROVIDER: Genetta Kenning, MD   END OF SESSION:  PT End of Session - 05/02/24 1105     Visit Number 13    Number of Visits 18    Date for PT Re-Evaluation 05/27/24    PT Start Time 1105    PT Stop Time 1200    PT Time Calculation (min) 55 min    Activity Tolerance Patient tolerated treatment well    Behavior During Therapy WFL for tasks assessed/performed                 Past Medical History:  Diagnosis Date   GERD (gastroesophageal reflux disease)    Hypothyroidism    Seizures (HCC)    Past Surgical History:  Procedure Laterality Date   AORTA - BILATERAL FEMORAL ARTERY BYPASS GRAFT Bilateral 01/07/2017   Procedure: AORTOBIFEMORAL BYPASS GRAFT;  Surgeon: Mayo Speck, MD;  Location: [facility] OR;  Service: Vascular;  Laterality: Bilateral;   I & D EXTREMITY Left 11/28/2014   Procedure: IRRIGATION AND DEBRIDEMENT;  REPAIR OF CRUSH INJURY;  Surgeon: Brunilda Capra, MD;  Location: [facility] OR;  Service: Orthopedics;  Laterality: Left;   INCISIONAL HERNIA REPAIR N/A 10/26/2017   Procedure: HERNIA REPAIR INCISIONAL WITH MESH;  Surgeon: Alanda Allegra, MD;  Location: [facility] ORS;  Service: General;  Laterality: N/A;   IR CT HEAD LTD  11/30/2023   IR INTRAVSC STENT CERV CAROTID W/EMB-PROT MOD SED INCL ANGIO  11/30/2023   IR PERCUTANEOUS ART THROMBECTOMY/INFUSION INTRACRANIAL INC DIAG ANGIO  11/30/2023   IR RADIOLOGIST EVAL & MGMT  03/28/2024   IR US  GUIDE VASC ACCESS RIGHT  11/30/2023   RADIOLOGY WITH ANESTHESIA N/A 11/30/2023   Procedure: IR WITH ANESTHESIA;  Surgeon: Radiologist, Medication, MD;  Location: [facility] OR;  Service: Radiology;  Laterality: N/A;   Patient Active Problem List   Diagnosis Date Noted   Dysphagia, post-stroke 12/16/2023   Spastic hemiparesis 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 (HCC)  12/16/2023   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   Acute ischemic right MCA stroke (HCC) 11/30/2023   Acute right MCA stroke (HCC) 11/30/2023   Acute hypoxic respiratory failure (HCC) 11/30/2023   Acute metabolic encephalopathy 11/30/2023   Aspiration pneumonia (HCC) 11/30/2023   Incisional hernia, without obstruction or gangrene    Aortoiliac occlusive disease (HCC) 01/07/2017   Leriche syndrome (HCC)    PAD (peripheral artery disease) (HCC) 01/03/2017   Critical lower limb ischemia (HCC) 01/03/2017   Cellulitis 12/29/2016   Cellulitis of left lower extremity     ONSET DATE: November 30, 2023  REFERRING DIAG: Right middle cerebral artery stroke, Left spastic hemiparesis, Shoulder impingement syndrome, left   THERAPY DIAG:  Right middle cerebral artery stroke (HCC)  Muscle weakness (generalized)  Rationale for Evaluation and Treatment: Rehabilitation  SUBJECTIVE:  SUBJECTIVE STATEMENT:  Patient reports 6/10 left shoulder pain today.   Pt accompanied by: self  PERTINENT HISTORY: peripheral artery disease, history of a CVA, and history of a seizure  PAIN:  Are you having pain? Yes: NPRS scale: 6/10 Pain location: left shoulder Pain description: intermittent, "hurts" Aggravating factors: raising his arm  Relieving factors: rest and medication  PRECAUTIONS: Fall  RED FLAGS: None   WEIGHT BEARING RESTRICTIONS: No  FALLS: Has patient fallen in last 6 months? Yes. Number of falls 1  LIVING ENVIRONMENT: Lives with: lives with their family Lives in: House/apartment Stairs: Yes, but he primarily uses a ramp to enter and exit his home Has following equipment at home: shower chair, Grab bars, and Ramped entry  PLOF: Independent  PATIENT GOALS: improved arm  mobility  OBJECTIVE:  Note: Objective measures were completed at Evaluation unless otherwise noted.  DIAGNOSTIC FINDINGS: 01/06/24 IMPRESSION: 1. No acute traumatic injury identified. Chronic left lamina papyracea fracture. 2. Expected evolution of large Right MCA territory infarct since December with developing encephalomalacia. No acute intracranial abnormality. COGNITION: Overall cognitive status: Within functional limits for tasks assessed   SENSATION: Patient reports numbness in his left hand since his stroke in December 2024  COORDINATION: No significant deficits observed  UPPER EXTREMITY ROM:  Active ROM Right eval Left eval  Shoulder flexion 142 72; painful  Shoulder extension    Shoulder abduction 120 70; painful   Shoulder adduction    Shoulder extension    Shoulder internal rotation To L2 To sacrum  Shoulder external rotation To T3 To left ear  Elbow flexion    Elbow extension    Wrist flexion    Wrist extension    Wrist ulnar deviation    Wrist radial deviation    Wrist pronation    Wrist supination     (Blank rows = not tested)   UPPER EXTREMITY MMT:  MMT Right eval Left eval  Shoulder flexion 4+/5   Shoulder extension    Shoulder abduction 5/5   Shoulder adduction    Shoulder extension    Shoulder internal rotation 4/5   Shoulder external rotation 4+/5   Middle trapezius    Lower trapezius    Elbow flexion    Elbow extension    Wrist flexion    Wrist extension    Wrist ulnar deviation    Wrist radial deviation    Wrist pronation    Wrist supination    Grip strength 97 15   (Blank rows = not tested)   LOWER EXTREMITY MMT:    MMT Right Eval Left Eval  Hip flexion 4/5 3+/5  Hip extension    Hip abduction    Hip adduction    Hip internal rotation    Hip external rotation    Knee flexion 5/5 4+/5  Knee extension 5/5 4+/5  Ankle dorsiflexion 4+/5 4/5  Ankle plantarflexion    Ankle inversion    Ankle eversion    (Blank rows =  not tested)  TRANSFERS: Assistive device utilized: None  Sit to stand: Complete Independence Stand to sit: Complete Independence  GAIT: Gait pattern: step to pattern and shuffling Assistive device utilized: None Level of assistance: SBA  FUNCTIONAL TESTS:  5 times sit to stand: 13.88 seconds without UE support  TREATMENT DATE:    04/27/24     EXERCISE LOG  Exercise Repetitions and Resistance Comments  Recumbent Bike  Lvl 3 x 17 minutes   Ball Roll Outs 3 mins   Cybex knee flexion  50# x 3 mins   Cybex knee extension  20# x 3 mins   Side stepping on foam  BUE support from parallel bars  Rocker board   BUE support from parallel bars   Blank cell = exercise not performed today   Manual Therapy Soft Tissue Mobilization: left shoulder, STW/M to left middle deltoid and bicep to decrease pain and tone   Modalities  Date:  Unattended Estim: Shoulder, IFC 80-150 Hz, 15 mins, Pain and Tone Hot Pack: Shoulder, 15 mins, Pain and Tone                                    04/20/24 EXERCISE LOG  Exercise Repetitions and Resistance Comments  Nustep  L4 x 16 minutes   Pulleys  2 minutes   Isometric ball press  3 minutes w/ 3 second hold   Ball roll out  2.5 minutes  For horizontal ABD and ADD   AA shoulder ER  2 minutes  With cane  Cybex knee extension 30# x 2 minutes   Cybex knee flexion  50# x 2 minutes   Isometric ball squeeze  2 minutes w/ 5 second hold  For shoulder IR  Sit to stand  20 reps     Blank cell = exercise not performed today                                    04/14/24 EXERCISE LOG  Exercise Repetitions and Resistance Comments  Nustep L4 x 11 minutes   Pulleys  2 minutes  Flexion  L shoulder assessment AROM and grip strength   Cybex knee flexion 50# x 2 minutes   Cybex knee extension 30# x 2 x 1 minute    Ball roll out  3 minutes For shoulder flexion   Isometric ball press  3 minutes w/ 3 second hold   Ball circles  3 minutes  CW and CCW w/ ball on the table  Isometric ball squeeze  3 minutes w/ 3 second hold  For shoulder IR   Blank cell = exercise not performed today   PATIENT EDUCATION: Education details: POC Person educated: Patient Education method: Explanation Education comprehension: verbalized understanding  HOME EXERCISE PROGRAM: [id number]  GOALS: Goals reviewed with patient? Yes  SHORT TERM GOALS: Target date: 03/23/24  Patient will be independent with his initial HEP.  Baseline: Goal status: MET  2.  Patient will be able to demonstrate at least 90 degrees of left shoulder flexion for improved function picking up items from his cabinet.  Baseline: 5/5: 91 degrees 04/14/24: 95 degrees Goal status: MET  3.  Patient will be able to demonstrate at least 90 degrees of left shoulder abduction for improved function reaching.  Baseline: 75 degrees 04/14/24: 71 degrees; 5/28: 96 degrees (AA) Goal status: MET  4.  Patient will improve his left hand grip strength to at least 25 pounds for improved function grasping items.  Baseline: 5/5: 15# 04/14/24: 10# Goal status: IN PROGRESS  LONG TERM GOALS: Target date: 04/13/24  Patient will be independent with his advanced HEP.  Baseline:  Goal  status: IN PROGRESS  2.  Patient will improve his five time sit to stand time to 12 seconds or less to reduce his fall risk.  Baseline: 5/5: 11.7 seconds Goal status: MET  3.  Patient will be able to demonstrate at least 120 degrees of left shoulder flexion for improved function washing his hair.  Baseline: see short term goals Goal status: IN PROGRESS  4.  Patient will be able to demonstrate at least 120 degrees of left shoulder abduction for improved function reaching overhead.  Baseline: see short term goals Goal status: IN PROGRESS  5.  Patient will improve his left hand grip strength to at least 40 pounds for improved function  carrying groceries.  Baseline: see short term goals Goal status: IN PROGRESS  ASSESSMENT:  CLINICAL IMPRESSION:   Pt arrives for today's treatment session reporting 6/10 left shoulder pain today.  Pt reporting continued increased pain with left shoulder.  Pt introduced to physio-ball roll outs on elevated treatment table.  Pt able to tolerate increased time with cybex exercises today without discomfort, but does report fatigue.  STW/M performed to left deltoid and bicep to decrease pain and tone.  Normal responses to estim and MH noted upon removal.  Pt reported 4/10 left shoulder pain at completion of today's treatment session.   OBJECTIVE IMPAIRMENTS: Abnormal gait, decreased activity tolerance, difficulty walking, decreased ROM, decreased strength, impaired sensation, impaired UE functional use, and pain.   ACTIVITY LIMITATIONS: carrying, lifting, reach over head, and locomotion level  PARTICIPATION LIMITATIONS: meal prep, cleaning, laundry, shopping, community activity, and yard work  PERSONAL FACTORS: Past/current experiences, Time since onset of injury/illness/exacerbation, Transportation, and 3+ comorbidities: peripheral artery disease, history of a CVA, and history of a seizure are also affecting patient's functional outcome.   REHAB POTENTIAL: Good  CLINICAL DECISION MAKING: Evolving/moderate complexity  EVALUATION COMPLEXITY: Moderate  PLAN:  PT FREQUENCY: 2x/week  PT DURATION: 6 weeks  PLANNED INTERVENTIONS: 97164- PT Re-evaluation, 97110-Therapeutic exercises, 97530- Therapeutic activity, [id number]- Neuromuscular re-education, 97535- Self Care, 16109- Manual therapy, [id number]- Gait training, [id number]- Electrical stimulation (unattended), Patient/Family education, Balance training, Cryotherapy, and Moist heat  PLAN FOR NEXT SESSION:  Nustep, UBE, continue upper and lower extremity strength LT , continue balance interventions, pulleys, AAROM, and gait training.    Deryl Flora,  PTA 05/02/2024, 12:01 PM

## 2024-05-04 ENCOUNTER — Ambulatory Visit

## 2024-05-04 DIAGNOSIS — M6281 Muscle weakness (generalized): Secondary | ICD-10-CM

## 2024-05-04 DIAGNOSIS — I63511 Cerebral infarction due to unspecified occlusion or stenosis of right middle cerebral artery: Secondary | ICD-10-CM

## 2024-05-04 NOTE — Therapy (Signed)
 OUTPATIENT PHYSICAL THERAPY NEURO TREATMENT   Patient Name: Ian Wong MRN: [id number] DOB:[birth year], 63 y.o., male Today's Date: 05/04/2024  REFERRING PROVIDER: Genetta Kenning, MD   END OF SESSION:  PT End of Session - 05/04/24 1342     Visit Number 14    Number of Visits 18    Date for PT Re-Evaluation 05/27/24    PT Start Time 1345    PT Stop Time 1437    PT Time Calculation (min) 52 min    Activity Tolerance Patient tolerated treatment well    Behavior During Therapy [facility] for tasks assessed/performed                  Past Medical History:  Diagnosis Date   GERD (gastroesophageal reflux disease)    Hypothyroidism    Seizures (HCC)    Past Surgical History:  Procedure Laterality Date   AORTA - BILATERAL FEMORAL ARTERY BYPASS GRAFT Bilateral 01/07/2017   Procedure: AORTOBIFEMORAL BYPASS GRAFT;  Surgeon: Mayo Speck, MD;  Location: [facility] OR;  Service: Vascular;  Laterality: Bilateral;   I & D EXTREMITY Left 11/28/2014   Procedure: IRRIGATION AND DEBRIDEMENT;  REPAIR OF CRUSH INJURY;  Surgeon: Brunilda Capra, MD;  Location: [facility] OR;  Service: Orthopedics;  Laterality: Left;   INCISIONAL HERNIA REPAIR N/A 10/26/2017   Procedure: HERNIA REPAIR INCISIONAL WITH MESH;  Surgeon: Alanda Allegra, MD;  Location: [facility] ORS;  Service: General;  Laterality: N/A;   IR CT HEAD LTD  11/30/2023   IR INTRAVSC STENT CERV CAROTID W/EMB-PROT MOD SED INCL ANGIO  11/30/2023   IR PERCUTANEOUS ART THROMBECTOMY/INFUSION INTRACRANIAL INC DIAG ANGIO  11/30/2023   IR RADIOLOGIST EVAL & MGMT  03/28/2024   IR US  GUIDE VASC ACCESS RIGHT  11/30/2023   RADIOLOGY WITH ANESTHESIA N/A 11/30/2023   Procedure: IR WITH ANESTHESIA;  Surgeon: Radiologist, Medication, MD;  Location: [facility] OR;  Service: Radiology;  Laterality: N/A;   Patient Active Problem List   Diagnosis Date Noted   Dysphagia, post-stroke 12/16/2023   Spastic hemiparesis 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 (HCC)  12/16/2023   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   Acute ischemic right MCA stroke (HCC) 11/30/2023   Acute right MCA stroke (HCC) 11/30/2023   Acute hypoxic respiratory failure (HCC) 11/30/2023   Acute metabolic encephalopathy 11/30/2023   Aspiration pneumonia (HCC) 11/30/2023   Incisional hernia, without obstruction or gangrene    Aortoiliac occlusive disease (HCC) 01/07/2017   Leriche syndrome (HCC)    PAD (peripheral artery disease) (HCC) 01/03/2017   Critical lower limb ischemia (HCC) 01/03/2017   Cellulitis 12/29/2016   Cellulitis of left lower extremity     ONSET DATE: November 30, 2023  REFERRING DIAG: Right middle cerebral artery stroke, Left spastic hemiparesis, Shoulder impingement syndrome, left   THERAPY DIAG:  Right middle cerebral artery stroke (HCC)  Muscle weakness (generalized)  Rationale for Evaluation and Treatment: Rehabilitation  SUBJECTIVE:  SUBJECTIVE STATEMENT:  Patient reports that he feels alright today. However, his shoulder does not feel any better or worse compared to his last appointment.   Pt accompanied by: self  PERTINENT HISTORY: peripheral artery disease, history of a CVA, and history of a seizure  PAIN:  Are you having pain? Yes: NPRS scale: 6/10 Pain location: left shoulder Pain description: intermittent, "hurts" Aggravating factors: raising his arm  Relieving factors: rest and medication  PRECAUTIONS: Fall  RED FLAGS: None   WEIGHT BEARING RESTRICTIONS: No  FALLS: Has patient fallen in last 6 months? Yes. Number of falls 1  LIVING ENVIRONMENT: Lives with: lives with their family Lives in: House/apartment Stairs: Yes, but he primarily uses a ramp to enter and exit his home Has following equipment at home: shower chair, Grab  bars, and Ramped entry  PLOF: Independent  PATIENT GOALS: improved arm mobility  OBJECTIVE:  Note: Objective measures were completed at Evaluation unless otherwise noted.  DIAGNOSTIC FINDINGS: 01/06/24 IMPRESSION: 1. No acute traumatic injury identified. Chronic left lamina papyracea fracture. 2. Expected evolution of large Right MCA territory infarct since December with developing encephalomalacia. No acute intracranial abnormality. COGNITION: Overall cognitive status: Within functional limits for tasks assessed   SENSATION: Patient reports numbness in his left hand since his stroke in December 2024  COORDINATION: No significant deficits observed  UPPER EXTREMITY ROM:  Active ROM Right eval Left eval  Shoulder flexion 142 72; painful  Shoulder extension    Shoulder abduction 120 70; painful   Shoulder adduction    Shoulder extension    Shoulder internal rotation To L2 To sacrum  Shoulder external rotation To T3 To left ear  Elbow flexion    Elbow extension    Wrist flexion    Wrist extension    Wrist ulnar deviation    Wrist radial deviation    Wrist pronation    Wrist supination     (Blank rows = not tested)   UPPER EXTREMITY MMT:  MMT Right eval Left eval  Shoulder flexion 4+/5   Shoulder extension    Shoulder abduction 5/5   Shoulder adduction    Shoulder extension    Shoulder internal rotation 4/5   Shoulder external rotation 4+/5   Middle trapezius    Lower trapezius    Elbow flexion    Elbow extension    Wrist flexion    Wrist extension    Wrist ulnar deviation    Wrist radial deviation    Wrist pronation    Wrist supination    Grip strength 97 15   (Blank rows = not tested)   LOWER EXTREMITY MMT:    MMT Right Eval Left Eval  Hip flexion 4/5 3+/5  Hip extension    Hip abduction    Hip adduction    Hip internal rotation    Hip external rotation    Knee flexion 5/5 4+/5  Knee extension 5/5 4+/5  Ankle dorsiflexion 4+/5 4/5  Ankle  plantarflexion    Ankle inversion    Ankle eversion    (Blank rows = not tested)  TRANSFERS: Assistive device utilized: None  Sit to stand: Complete Independence Stand to sit: Complete Independence  GAIT: Gait pattern: step to pattern and shuffling Assistive device utilized: None Level of assistance: SBA  FUNCTIONAL TESTS:  5 times sit to stand: 13.88 seconds without UE support  TREATMENT DATE:                                     05/04/24 EXERCISE LOG  Exercise Repetitions and Resistance Comments  Nustep  L5 x 18.5 minutes   Cybex knee flexion  50# x 3 minutes   Cybex knee extension 30# x 2 minutes    Walking on foam  2 minutes  Narrow BOS; BUE support from parallel bars  Seated hip ADD isometric  15 reps w/ 5 second hold    Ball squeeze  3 minutes w/ 5 second hold  For shoulder IR    Blank cell = exercise not performed today  Modalities: no redness or adverse reaction to today's modalities  Date:  Unattended Estim: left deltoid, pre mod @ 80-150 Hz, 15 mins, Pain and Tone Hot Pack: Shoulder, 15 mins, Pain and Tone  04/27/24     EXERCISE LOG  Exercise Repetitions and Resistance Comments  Recumbent Bike  Lvl 3 x 17 minutes   Ball Roll Outs 3 mins   Cybex knee flexion  50# x 3 mins   Cybex knee extension  20# x 3 mins   Side stepping on foam  BUE support from parallel bars  Rocker board   BUE support from parallel bars   Blank cell = exercise not performed today   Manual Therapy Soft Tissue Mobilization: left shoulder, STW/M to left middle deltoid and bicep to decrease pain and tone   Modalities  Date:  Unattended Estim: Shoulder, IFC 80-150 Hz, 15 mins, Pain and Tone Hot Pack: Shoulder, 15 mins, Pain and Tone                                    04/20/24 EXERCISE LOG  Exercise Repetitions and Resistance Comments  Nustep  L4 x 16 minutes   Pulleys  2  minutes   Isometric ball press  3 minutes w/ 3 second hold   Ball roll out  2.5 minutes  For horizontal ABD and ADD   AA shoulder ER  2 minutes  With cane  Cybex knee extension 30# x 2 minutes   Cybex knee flexion  50# x 2 minutes   Isometric ball squeeze  2 minutes w/ 5 second hold  For shoulder IR  Sit to stand  20 reps     Blank cell = exercise not performed today   PATIENT EDUCATION: Education details: healing and TENS Person educated: Patient Education method: Explanation Education comprehension: verbalized understanding  HOME EXERCISE PROGRAM: [id number]  GOALS: Goals reviewed with patient? Yes  SHORT TERM GOALS: Target date: 03/23/24  Patient will be independent with his initial HEP.  Baseline: Goal status: MET  2.  Patient will be able to demonstrate at least 90 degrees of left shoulder flexion for improved function picking up items from his cabinet.  Baseline: 5/5: 91 degrees 04/14/24: 95 degrees Goal status: MET  3.  Patient will be able to demonstrate at least 90 degrees of left shoulder abduction for improved function reaching.  Baseline: 75 degrees 04/14/24: 71 degrees; 5/28: 96 degrees (AA) Goal status: MET  4.  Patient will improve his left hand grip strength to at least 25 pounds for improved function grasping items.  Baseline: 5/5: 15# 04/14/24: 10# Goal status: IN PROGRESS  LONG TERM GOALS: Target date: 04/13/24  Patient will be independent with his advanced HEP.  Baseline:  Goal status: IN PROGRESS  2.  Patient will improve his five time sit to stand time to 12 seconds or less to reduce his fall risk.  Baseline: 5/5: 11.7 seconds Goal status: MET  3.  Patient will be able to demonstrate at least 120 degrees of left shoulder flexion for improved function washing his hair.  Baseline: see short term goals Goal status: IN PROGRESS  4.  Patient will be able to demonstrate at least 120 degrees of left shoulder abduction for improved function reaching  overhead.  Baseline: see short term goals Goal status: IN PROGRESS  5.  Patient will improve his left hand grip strength to at least 40 pounds for improved function carrying groceries.  Baseline: see short term goals Goal status: IN PROGRESS  ASSESSMENT:  CLINICAL IMPRESSION:   Patient was progressed with multiple familiar interventions for improved lower extremity strength and stability. He required minimal cueing with today's weighted interventions for improved eccentric control to facilitate increase lower extremity demand. He experienced no significant increase in pain or discomfort with any of today's interventions. He reported feeling "a little better" upon the conclusion of treatment. He continues to require skilled physical therapy to address his remaining impairments to maximize his functional mobility.   OBJECTIVE IMPAIRMENTS: Abnormal gait, decreased activity tolerance, difficulty walking, decreased ROM, decreased strength, impaired sensation, impaired UE functional use, and pain.   ACTIVITY LIMITATIONS: carrying, lifting, reach over head, and locomotion level  PARTICIPATION LIMITATIONS: meal prep, cleaning, laundry, shopping, community activity, and yard work  PERSONAL FACTORS: Past/current experiences, Time since onset of injury/illness/exacerbation, Transportation, and 3+ comorbidities: peripheral artery disease, history of a CVA, and history of a seizure are also affecting patient's functional outcome.   REHAB POTENTIAL: Good  CLINICAL DECISION MAKING: Evolving/moderate complexity  EVALUATION COMPLEXITY: Moderate  PLAN:  PT FREQUENCY: 2x/week  PT DURATION: 6 weeks  PLANNED INTERVENTIONS: 97164- PT Re-evaluation, 97110-Therapeutic exercises, 97530- Therapeutic activity, [id number]- Neuromuscular re-education, 97535- Self Care, 16109- Manual therapy, [id number]- Gait training, [phone]- Electrical stimulation (unattended), Patient/Family education, Balance training, Cryotherapy, and  Moist heat  PLAN FOR NEXT SESSION:  Nustep, UBE, continue upper and lower extremity strength LT , continue balance interventions, pulleys, AAROM, and gait training.    Lane Pinon, PT 05/04/2024, 2:38 PM

## 2024-05-09 ENCOUNTER — Ambulatory Visit: Admitting: *Deleted

## 2024-05-11 ENCOUNTER — Ambulatory Visit

## 2024-05-11 DIAGNOSIS — M6281 Muscle weakness (generalized): Secondary | ICD-10-CM

## 2024-05-11 DIAGNOSIS — I63511 Cerebral infarction due to unspecified occlusion or stenosis of right middle cerebral artery: Secondary | ICD-10-CM | POA: Diagnosis not present

## 2024-05-11 NOTE — Therapy (Signed)
 OUTPATIENT PHYSICAL THERAPY NEURO TREATMENT   Patient Name: Ian Wong MRN: [id number] DOB:03/21/1961, 63 y.o., male Today's Date: 05/11/2024  REFERRING PROVIDER: Genetta Kenning, MD   END OF SESSION:  PT End of Session - 05/11/24 1349     Visit Number 15    Number of Visits 18    Date for PT Re-Evaluation 05/27/24    PT Start Time 1348    PT Stop Time 1445    PT Time Calculation (min) 57 min    Activity Tolerance Patient tolerated treatment well    Behavior During Therapy [facility] for tasks assessed/performed                  Past Medical History:  Diagnosis Date   GERD (gastroesophageal reflux disease)    Hypothyroidism    Seizures (HCC)    Past Surgical History:  Procedure Laterality Date   AORTA - BILATERAL FEMORAL ARTERY BYPASS GRAFT Bilateral 01/07/2017   Procedure: AORTOBIFEMORAL BYPASS GRAFT;  Surgeon: Mayo Speck, MD;  Location: [facility] OR;  Service: Vascular;  Laterality: Bilateral;   I & D EXTREMITY Left 11/28/2014   Procedure: IRRIGATION AND DEBRIDEMENT;  REPAIR OF CRUSH INJURY;  Surgeon: Brunilda Capra, MD;  Location: [facility] OR;  Service: Orthopedics;  Laterality: Left;   INCISIONAL HERNIA REPAIR N/A 10/26/2017   Procedure: HERNIA REPAIR INCISIONAL WITH MESH;  Surgeon: Alanda Allegra, MD;  Location: [facility] ORS;  Service: General;  Laterality: N/A;   IR CT HEAD LTD  11/30/2023   IR INTRAVSC STENT CERV CAROTID W/EMB-PROT MOD SED INCL ANGIO  11/30/2023   IR PERCUTANEOUS ART THROMBECTOMY/INFUSION INTRACRANIAL INC DIAG ANGIO  11/30/2023   IR RADIOLOGIST EVAL & MGMT  03/28/2024   IR US  GUIDE VASC ACCESS RIGHT  11/30/2023   RADIOLOGY WITH ANESTHESIA N/A 11/30/2023   Procedure: IR WITH ANESTHESIA;  Surgeon: Radiologist, Medication, MD;  Location: [facility] OR;  Service: Radiology;  Laterality: N/A;   Patient Active Problem List   Diagnosis Date Noted   Dysphagia, post-stroke 12/16/2023   Spastic hemiparesis 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 (HCC)  12/16/2023   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   Acute ischemic right MCA stroke (HCC) 11/30/2023   Acute right MCA stroke (HCC) 11/30/2023   Acute hypoxic respiratory failure (HCC) 11/30/2023   Acute metabolic encephalopathy 11/30/2023   Aspiration pneumonia (HCC) 11/30/2023   Incisional hernia, without obstruction or gangrene    Aortoiliac occlusive disease (HCC) 01/07/2017   Leriche syndrome (HCC)    PAD (peripheral artery disease) (HCC) 01/03/2017   Critical lower limb ischemia (HCC) 01/03/2017   Cellulitis 12/29/2016   Cellulitis of left lower extremity     ONSET DATE: November 30, 2023  REFERRING DIAG: Right middle cerebral artery stroke, Left spastic hemiparesis, Shoulder impingement syndrome, left   THERAPY DIAG:  Right middle cerebral artery stroke (HCC)  Muscle weakness (generalized)  Rationale for Evaluation and Treatment: Rehabilitation  SUBJECTIVE:  SUBJECTIVE STATEMENT:  Patient reports 5/10 left shoulder pain.    Pt accompanied by: self  PERTINENT HISTORY: peripheral artery disease, history of a CVA, and history of a seizure  PAIN:  Are you having pain? Yes: NPRS scale: 5/10 Pain location: left shoulder Pain description: intermittent, hurts Aggravating factors: raising his arm  Relieving factors: rest and medication  PRECAUTIONS: Fall  RED FLAGS: None   WEIGHT BEARING RESTRICTIONS: No  FALLS: Has patient fallen in last 6 months? Yes. Number of falls 1  LIVING ENVIRONMENT: Lives with: lives with their family Lives in: House/apartment Stairs: Yes, but he primarily uses a ramp to enter and exit his home Has following equipment at home: shower chair, Grab bars, and Ramped entry  PLOF: Independent  PATIENT GOALS: improved arm  mobility  OBJECTIVE:  Note: Objective measures were completed at Evaluation unless otherwise noted.  DIAGNOSTIC FINDINGS: 01/06/24 IMPRESSION: 1. No acute traumatic injury identified. Chronic left lamina papyracea fracture. 2. Expected evolution of large Right MCA territory infarct since December with developing encephalomalacia. No acute intracranial abnormality. COGNITION: Overall cognitive status: Within functional limits for tasks assessed   SENSATION: Patient reports numbness in his left hand since his stroke in December 2024  COORDINATION: No significant deficits observed  UPPER EXTREMITY ROM:  Active ROM Right eval Left eval  Shoulder flexion 142 72; painful  Shoulder extension    Shoulder abduction 120 70; painful   Shoulder adduction    Shoulder extension    Shoulder internal rotation To L2 To sacrum  Shoulder external rotation To T3 To left ear  Elbow flexion    Elbow extension    Wrist flexion    Wrist extension    Wrist ulnar deviation    Wrist radial deviation    Wrist pronation    Wrist supination     (Blank rows = not tested)   UPPER EXTREMITY MMT:  MMT Right eval Left eval  Shoulder flexion 4+/5   Shoulder extension    Shoulder abduction 5/5   Shoulder adduction    Shoulder extension    Shoulder internal rotation 4/5   Shoulder external rotation 4+/5   Middle trapezius    Lower trapezius    Elbow flexion    Elbow extension    Wrist flexion    Wrist extension    Wrist ulnar deviation    Wrist radial deviation    Wrist pronation    Wrist supination    Grip strength 97 15   (Blank rows = not tested)   LOWER EXTREMITY MMT:    MMT Right Eval Left Eval  Hip flexion 4/5 3+/5  Hip extension    Hip abduction    Hip adduction    Hip internal rotation    Hip external rotation    Knee flexion 5/5 4+/5  Knee extension 5/5 4+/5  Ankle dorsiflexion 4+/5 4/5  Ankle plantarflexion    Ankle inversion    Ankle eversion    (Blank rows =  not tested)  TRANSFERS: Assistive device utilized: None  Sit to stand: Complete Independence Stand to sit: Complete Independence  GAIT: Gait pattern: step to pattern and shuffling Assistive device utilized: None Level of assistance: SBA  FUNCTIONAL TESTS:  5 times sit to stand: 13.88 seconds without UE support  TREATMENT DATE:                                     05/11/24 EXERCISE LOG  Exercise Repetitions and Resistance Comments  Nustep  L5 x   Cybex knee flexion  50# x 3 minutes   Cybex knee extension 30# x 3 minutes    Side-Stepping 3.5 mins Airex Narrow BOS; BUE support from parallel bars  Tandem Gait 3.5 mins Airex   Rockerboard 5 mins   Seated hip ADD isometric     Ball squeeze   For shoulder IR    Blank cell = exercise not performed today  Modalities: no redness or adverse reaction to today's modalities  Date:  Unattended Estim: left deltoid, pre mod @ 80-150 Hz, 15 mins, Pain and Tone Hot Pack: Shoulder, 15 mins, Pain and Tone  04/27/24     EXERCISE LOG  Exercise Repetitions and Resistance Comments  Recumbent Bike  Lvl 3 x 17 minutes   Ball Roll Outs 3 mins   Cybex knee flexion  50# x 3 mins   Cybex knee extension  20# x 3 mins   Side stepping on foam  BUE support from parallel bars  Rocker board   BUE support from parallel bars   Blank cell = exercise not performed today   Manual Therapy Soft Tissue Mobilization: left shoulder, STW/M to left middle deltoid and bicep to decrease pain and tone   Modalities  Date:  Unattended Estim: Shoulder, IFC 80-150 Hz, 15 mins, Pain and Tone Hot Pack: Shoulder, 15 mins, Pain and Tone                                    04/20/24 EXERCISE LOG  Exercise Repetitions and Resistance Comments  Nustep  L4 x 16 minutes   Pulleys  2 minutes   Isometric ball press  3 minutes w/ 3 second hold   Ball roll out   2.5 minutes  For horizontal ABD and ADD   AA shoulder ER  2 minutes  With cane  Cybex knee extension 30# x 2 minutes   Cybex knee flexion  50# x 2 minutes   Isometric ball squeeze  2 minutes w/ 5 second hold  For shoulder IR  Sit to stand  20 reps     Blank cell = exercise not performed today   PATIENT EDUCATION: Education details: healing and TENS Person educated: Patient Education method: Explanation Education comprehension: verbalized understanding  HOME EXERCISE PROGRAM: [id number]  GOALS: Goals reviewed with patient? Yes  SHORT TERM GOALS: Target date: 03/23/24  Patient will be independent with his initial HEP.  Baseline: Goal status: MET  2.  Patient will be able to demonstrate at least 90 degrees of left shoulder flexion for improved function picking up items from his cabinet.  Baseline: 5/5: 91 degrees 04/14/24: 95 degrees Goal status: MET  3.  Patient will be able to demonstrate at least 90 degrees of left shoulder abduction for improved function reaching.  Baseline: 75 degrees 04/14/24: 71 degrees; 5/28: 96 degrees (AA) Goal status: MET  4.  Patient will improve his left hand grip strength to at least 25 pounds for improved function grasping items.  Baseline: 5/5: 15# 04/14/24: 10# Goal status: IN PROGRESS  LONG TERM GOALS: Target date: 04/13/24  Patient will be independent  with his advanced HEP.  Baseline:  Goal status: IN PROGRESS  2.  Patient will improve his five time sit to stand time to 12 seconds or less to reduce his fall risk.  Baseline: 5/5: 11.7 seconds Goal status: MET  3.  Patient will be able to demonstrate at least 120 degrees of left shoulder flexion for improved function washing his hair.  Baseline: see short term goals Goal status: IN PROGRESS  4.  Patient will be able to demonstrate at least 120 degrees of left shoulder abduction for improved function reaching overhead.  Baseline: see short term goals Goal status: IN PROGRESS  5.   Patient will improve his left hand grip strength to at least 40 pounds for improved function carrying groceries.  Baseline: see short term goals Goal status: IN PROGRESS  ASSESSMENT:  CLINICAL IMPRESSION:   Pt arrives for today's treatment session reporting 5/10 left shoulder and left hand pain.  Pt demonstrating increased left hand edema and unable to create a fist with left hand.  Pt's chief limitation is left UE pain and decreased ROM.  Pt states that LUE has not been imaged in any way since his CVA.  Pt able to demonstrate approximately 90 degrees of left shoulder flexion and abduction with AA.  Pt has pulley system at home that he purchased, but has issues with left hand grip due to pain and weakness which makes use increasingly difficult.  Concern for subluxation or other injury to left shoulder.  Normal responses to estim noted upon removal.  Pt reported decreased pain at completion of today's treatment session.   OBJECTIVE IMPAIRMENTS: Abnormal gait, decreased activity tolerance, difficulty walking, decreased ROM, decreased strength, impaired sensation, impaired UE functional use, and pain.   ACTIVITY LIMITATIONS: carrying, lifting, reach over head, and locomotion level  PARTICIPATION LIMITATIONS: meal prep, cleaning, laundry, shopping, community activity, and yard work  PERSONAL FACTORS: Past/current experiences, Time since onset of injury/illness/exacerbation, Transportation, and 3+ comorbidities: peripheral artery disease, history of a CVA, and history of a seizure are also affecting patient's functional outcome.   REHAB POTENTIAL: Good  CLINICAL DECISION MAKING: Evolving/moderate complexity  EVALUATION COMPLEXITY: Moderate  PLAN:  PT FREQUENCY: 2x/week  PT DURATION: 6 weeks  PLANNED INTERVENTIONS: 97164- PT Re-evaluation, 97110-Therapeutic exercises, 97530- Therapeutic activity, [id number]- Neuromuscular re-education, 97535- Self Care, [id number]- Manual therapy, [id number]- Gait training,  [phone]- Electrical stimulation (unattended), Patient/Family education, Balance training, Cryotherapy, and Moist heat  PLAN FOR NEXT SESSION:  Nustep, UBE, continue upper and lower extremity strength LT , continue balance interventions, pulleys, AAROM, and gait training.    Deryl Flora, PTA 05/11/2024, 3:10 PM

## 2024-05-17 ENCOUNTER — Encounter: Payer: Self-pay | Admitting: Physical Medicine & Rehabilitation

## 2024-05-17 ENCOUNTER — Ambulatory Visit (HOSPITAL_COMMUNITY)
Admission: RE | Admit: 2024-05-17 | Discharge: 2024-05-17 | Disposition: A | Discharge status: Home / Self Care | Source: Ambulatory Visit | Attending: Physical Medicine & Rehabilitation | Admitting: Physical Medicine & Rehabilitation

## 2024-05-17 ENCOUNTER — Encounter: Attending: Physical Medicine & Rehabilitation | Admitting: Physical Medicine & Rehabilitation

## 2024-05-17 VITALS — BP 116/70 | HR 86 | Ht 71.0 in | Wt 187.0 lb

## 2024-05-17 DIAGNOSIS — M7542 Impingement syndrome of left shoulder: Secondary | ICD-10-CM | POA: Insufficient documentation

## 2024-05-17 MED ORDER — GABAPENTIN 300 MG PO CAPS: 300.0000 mg | ORAL_CAPSULE | Freq: Two times a day (BID) | ORAL | 1 refills | Status: DC

## 2024-05-17 NOTE — Progress Notes (Signed)
 Subjective:    Patient ID: Ian Wong, male    DOB: 1961-02-05, 63 y.o.   MRN: [id number] 63 y.o. right-handed male with history significant for GERD hypothyroidism, remote seizure, PAD with bilateral femoral artery bypass 2018, quit smoking 7 years ago.  Per chart review lives with spouse.  Mobile home with a ramped entrance.  Independent prior to admission.  They do have a son that is disabled.  Presented to [facility] 11/30/2023 with right gaze deviation and left hemiplegia/slurred speech of acute onset.  Cranial CT scan showed hyperdense right M1 MCA concerning for thrombus.  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  Patient did receive TNK at [facility]  CT angiogram of the head and neck occluded right ICA at its origin with nonopacification throughout the neck.  Severe right vertebral artery origin stenosis.  Severe stenosis of the intradural small/nondominant left vertebral artery.  Approximately 70% stenosis of the left ICA origin.  Admission chemistries unremarkable except WBC 14,400 hemoglobin A1c 5.9.  Patient was transferred to [facility] underwent cerebral angiogram mechanical thrombectomy as well as right carotid stenting and angioplasty 11/30/2023 per Dr. Rooney Cole.  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  HPI 63 year old male with MCA distribution infarct Who returns today with chief complaint of left shoulder pain.  Pain does interfere with physical therapy.  Mainly occurs with movement of the shoulder.  He fell with his initial stroke approximately 6 months ago and again on 01/06/2024 but no recent falls. While on inpatient rehab he did have some shoulder pain as well.  This did respond to physical therapy The patient still feels like he has reduced sensation in the left upper extremity.  He also has continued weakness.  He is receiving physical therapy on an outpatient basis.   His shoulder pain in therapy is 5/10 Pain Inventory Average Pain 6 Pain Right Now 6 My pain is intermittent, aching, and numbness   In the last 24 hours, has pain interfered with the following? General activity 6 Relation with others 6 Enjoyment of life 6 What TIME of day is your pain at its worst? night Sleep (in general) Fair  Pain is worse with: inactivity Pain improves with: medication Relief from Meds: a little   Family History  Problem Relation Age of Onset   COPD Mother    Other Brother    Social History   Socioeconomic History   Marital status: Married    Spouse name: Not on file   Number of children: Not on file   Years of education: Not on file   Highest education level: Not on file  Occupational History   Not on file  Tobacco Use   Smoking status: Former    Current packs/day: 0.00    Types: Cigarettes    Quit date: 12/2016    Years since quitting: 7.4   Smokeless tobacco: Never  Vaping Use   Vaping status: Never Used  Substance and Sexual Activity   Alcohol use: Not Currently    Comment: last drank etoh 1 1/2 months ago.   Drug use: No   Sexual activity: Not on file  Other Topics Concern   Not on file  Social History Narrative   Not on file   Social Drivers of Health   Financial Resource Strain: Not on file  Food Insecurity: Not on file  Transportation Needs: Not on file  Physical Activity: Not on file  Stress:  Not on file  Social Connections: Not on file   Past Surgical History:  Procedure Laterality Date   AORTA - BILATERAL FEMORAL ARTERY BYPASS GRAFT Bilateral 01/07/2017   Procedure: AORTOBIFEMORAL BYPASS GRAFT;  Surgeon: Mayo Speck, MD;  Location: [facility] OR;  Service: Vascular;  Laterality: Bilateral;   I & D EXTREMITY Left 11/28/2014   Procedure: IRRIGATION AND DEBRIDEMENT;  REPAIR OF CRUSH INJURY;  Surgeon: Brunilda Capra, MD;  Location: [facility] OR;  Service: Orthopedics;  Laterality: Left;   INCISIONAL HERNIA REPAIR N/A 10/26/2017    Procedure: HERNIA REPAIR INCISIONAL WITH MESH;  Surgeon: Alanda Allegra, MD;  Location: [facility] ORS;  Service: General;  Laterality: N/A;   IR CT HEAD LTD  11/30/2023   IR INTRAVSC STENT CERV CAROTID W/EMB-PROT MOD SED INCL ANGIO  11/30/2023   IR PERCUTANEOUS ART THROMBECTOMY/INFUSION INTRACRANIAL INC DIAG ANGIO  11/30/2023   IR RADIOLOGIST EVAL & MGMT  03/28/2024   IR US  GUIDE VASC ACCESS RIGHT  11/30/2023   RADIOLOGY WITH ANESTHESIA N/A 11/30/2023   Procedure: IR WITH ANESTHESIA;  Surgeon: Radiologist, Medication, MD;  Location: [facility] OR;  Service: Radiology;  Laterality: N/A;   Past Surgical History:  Procedure Laterality Date   AORTA - BILATERAL FEMORAL ARTERY BYPASS GRAFT Bilateral 01/07/2017   Procedure: AORTOBIFEMORAL BYPASS GRAFT;  Surgeon: Mayo Speck, MD;  Location: [facility] OR;  Service: Vascular;  Laterality: Bilateral;   I & D EXTREMITY Left 11/28/2014   Procedure: IRRIGATION AND DEBRIDEMENT;  REPAIR OF CRUSH INJURY;  Surgeon: Brunilda Capra, MD;  Location: [facility] OR;  Service: Orthopedics;  Laterality: Left;   INCISIONAL HERNIA REPAIR N/A 10/26/2017   Procedure: HERNIA REPAIR INCISIONAL WITH MESH;  Surgeon: Alanda Allegra, MD;  Location: [facility] OR[facility];  Service: General;  Laterality: N/A;   IR CT HEAD LTD  11/30/2023   IR INTRAVSC STENT CERV CAROTID W/EMB-PROT MOD SED INCL ANGIO  11/30/2023   IR PERCUTANEOUS ART THROMBECTOMY/INFUSION INTRACRANIAL INC DIAG ANGIO  11/30/2023   IR RADIOLOGIST EVAL & MGMT  03/28/2024   IR US  GUIDE VASC ACCESS RIGHT  11/30/2023   RADIOLOGY WITH ANESTHESIA N/A 11/30/2023   Procedure: IR WITH ANESTHESIA;  Surgeon: Radiologist, Medication, MD;  Location: [facility] OR;  Service: Radiology;  Laterality: N/A;   Past Medical History:  Diagnosis Date   GERD (gastroesophageal reflux disease)    Hypothyroidism    Seizures (HCC)    There were no vitals taken for this visit.  Opioid Risk Score:   Fall Risk Score:  `1  Depression screen PHQ 2/9     05/17/2024    2:29 PM 01/13/2024     1:32 PM  Depression screen PHQ 2/9  Decreased Interest 1 0  Down, Depressed, Hopeless 1 0  PHQ - 2 Score 2 0  Altered sleeping  0  Tired, decreased energy  0  Change in appetite  0  Feeling bad or failure about yourself   0  Trouble concentrating  0  Moving slowly or fidgety/restless  0  Suicidal thoughts  0  PHQ-9 Score  0  Difficult doing work/chores  Not difficult at all    Review of Systems  Musculoskeletal:        Pain in the left shoulder down to left hand  Skin:        Left hand swelling  Neurological:  Positive for numbness (left hand numbness).  All other systems reviewed and are negative.     Objective:   Physical Exam  Assessment & Plan:  1.  Left shoulder pain post stroke likely multifactorial.  The patient has subluxation as well as limited range of motion consistent with adhesive capsulitis.  In addition he has altered sensation of the left arm and he has some pain that appears to be more neurogenic in nature. X-ray of the left shoulder 05/17/2024 reviewed.  No evidence of fracture or dislocation. There is a mild inferior subluxation Glenohumeral joint without significant osteoarthritis or degenerative changes, AC joint also without any significant degenerative changes. Await official radiology report Will continue physical therapy Start gabapentin 300 mg twice daily Continue Tylenol  as needed I will see the patient back in about 1 month perform shoulder injection if pain is not significantly improved  Discussed my recommendations with patient and his wife.  The x-ray was not available at the time of the visit and this  was pended subsequent to visit.

## 2024-05-17 NOTE — Patient Instructions (Signed)
Adhesive Capsulitis  Adhesive capsulitis, also called frozen shoulder, causes the shoulder to become stiff and painful to move. This condition happens when there is inflammation of the tendons and ligaments that surround the shoulder joint (shoulder capsule). Tendons are tissues that connect muscle to bone. Ligaments are tissues that connect bones to each other. What are the causes? This condition may be caused by: An injury to your shoulder joint. Straining your shoulder. Not moving your shoulder for a period of time. This can happen if your arm was injured or in a sling. In some cases, the cause is not known. What increases the risk? You are more likely to develop this condition if: You are male. You are older than 62 years of age. You have certain other conditions, such as: Diabetes. Thyroid problems. Stroke. You recently had surgery, especially shoulder or neck surgery. What are the signs or symptoms? Symptoms of this condition include: Pain in your shoulder when you move your arm. There may also be pain when parts of your shoulder are touched. The pain may be worse at night or when you are resting. Not being able to move your shoulder normally. Sudden muscle tightening (muscle spasms). How is this diagnosed? This condition is diagnosed with a physical exam and imaging tests, such as an X-ray or MRI. How is this treated? This condition may be treated with: Treatment of the injury or condition that caused the adhesive capsulitis. Medicines for pain, inflammation, or muscle spasms. Injections of medicine (steroids) into the shoulder joint. Physical therapy. This involves doing exercises to get the shoulder moving again. Shoulder manipulation. This is a procedure to move the shoulder into another position. It is done after you are given a medicine to make you fall asleep (general anesthesia). The joint may also be injected with salt water at high pressure to break down  scarring. Surgery. This may be done in severe cases when other treatments do not work. Some less common treatments include: Injection of hyaluronic acid into the shoulder joint. This substance is a normal part of the fluid inside joints. It helps lubricate the joint and can lower inflammation. Injection of platelet-rich plasma into the shoulder joint. This uses a type of blood cell from your own blood that may speed up the healing process. Sending energy waves to the affected area (extracorporeal shock wave therapy). Most people recover completely from adhesive capsulitis, but some may not get back full shoulder movement. Follow these instructions at home: Managing pain, stiffness, and swelling     If told, put ice on the injured area. Put ice in a plastic bag. Place a towel between your skin and the bag. Leave the ice on for 20 minutes, 2-3 times a day. If told, apply heat to the affected area before you exercise. Use the heat source that your health care provider recommends, such as a moist heat pack or a heating pad. Place a towel between your skin and the heat source. Leave the heat on for 20-30 minutes. If your skin turns bright red, remove the ice or heat right away to prevent skin damage. The risk of damage is higher if you cannot feel pain, heat, or cold. General instructions Take over-the-counter and prescription medicines only as told by your provider. If you are being treated with physical therapy, do exercises as told. Avoid activities or exercises that put a lot of demand on your shoulder, such as throwing. These can make pain worse. Contact a health care provider if: You have  new symptoms. Your symptoms get worse. This information is not intended to replace advice given to you by your health care provider. Make sure you discuss any questions you have with your health care provider. Document Revised: 09/01/2022 Document Reviewed: 09/01/2022 Elsevier Patient Education  2024  [employer].

## 2024-05-18 ENCOUNTER — Ambulatory Visit

## 2024-05-18 DIAGNOSIS — M6281 Muscle weakness (generalized): Secondary | ICD-10-CM

## 2024-05-18 DIAGNOSIS — I63511 Cerebral infarction due to unspecified occlusion or stenosis of right middle cerebral artery: Secondary | ICD-10-CM | POA: Diagnosis not present

## 2024-05-18 NOTE — Therapy (Addendum)
 OUTPATIENT PHYSICAL THERAPY NEURO TREATMENT   Patient Name: Ian Wong MRN: [id number] DOB:03-21-1961, 63 y.o., male Today's Date: 05/18/2024  REFERRING PROVIDER: Carilyn Prentice BRAVO, MD   END OF SESSION:  PT End of Session - 05/18/24 1349     Visit Number 16    Number of Visits 18    Date for PT Re-Evaluation 05/27/24    PT Start Time 1345    PT Stop Time 1443    PT Time Calculation (min) 58 min    Activity Tolerance Patient tolerated treatment well    Behavior During Therapy [facility] for tasks assessed/performed               Past Medical History:  Diagnosis Date   GERD (gastroesophageal reflux disease)    Hypothyroidism    Seizures (HCC)    Past Surgical History:  Procedure Laterality Date   AORTA - BILATERAL FEMORAL ARTERY BYPASS GRAFT Bilateral 01/07/2017   Procedure: AORTOBIFEMORAL BYPASS GRAFT;  Surgeon: Krystal JULIANNA Doing, MD;  Location: [facility] OR;  Service: Vascular;  Laterality: Bilateral;   I & D EXTREMITY Left 11/28/2014   Procedure: IRRIGATION AND DEBRIDEMENT;  REPAIR OF CRUSH INJURY;  Surgeon: Franky Curia, MD;  Location: [facility] OR;  Service: Orthopedics;  Laterality: Left;   INCISIONAL HERNIA REPAIR N/A 10/26/2017   Procedure: HERNIA REPAIR INCISIONAL WITH MESH;  Surgeon: Mavis Anes, MD;  Location: [facility] ORS;  Service: General;  Laterality: N/A;   IR CT HEAD LTD  11/30/2023   IR INTRAVSC STENT CERV CAROTID W/EMB-PROT MOD SED INCL ANGIO  11/30/2023   IR PERCUTANEOUS ART THROMBECTOMY/INFUSION INTRACRANIAL INC DIAG ANGIO  11/30/2023   IR RADIOLOGIST EVAL & MGMT  03/28/2024   IR US  GUIDE VASC ACCESS RIGHT  11/30/2023   RADIOLOGY WITH ANESTHESIA N/A 11/30/2023   Procedure: IR WITH ANESTHESIA;  Surgeon: Radiologist, Medication, MD;  Location: [facility] OR;  Service: Radiology;  Laterality: N/A;   Patient Active Problem List   Diagnosis Date Noted   Dysphagia, post-stroke 12/16/2023   Spastic hemiparesis 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 (HCC)  12/16/2023   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   Acute ischemic right MCA stroke (HCC) 11/30/2023   Acute right MCA stroke (HCC) 11/30/2023   Acute hypoxic respiratory failure (HCC) 11/30/2023   Acute metabolic encephalopathy 11/30/2023   Aspiration pneumonia (HCC) 11/30/2023   Incisional hernia, without obstruction or gangrene    Aortoiliac occlusive disease (HCC) 01/07/2017   Leriche syndrome (HCC)    PAD (peripheral artery disease) (HCC) 01/03/2017   Critical lower limb ischemia (HCC) 01/03/2017   Cellulitis 12/29/2016   Cellulitis of left lower extremity     ONSET DATE: November 30, 2023  REFERRING DIAG: Right middle cerebral artery stroke, Left spastic hemiparesis, Shoulder impingement syndrome, left   THERAPY DIAG:  Right middle cerebral artery stroke (HCC)  Muscle weakness (generalized)  Rationale for Evaluation and Treatment: Rehabilitation  SUBJECTIVE:  SUBJECTIVE STATEMENT:  Patient reports 5/10 left shoulder pain.  Reports he had x-rays done on his left shoulder yesterday, but they have not resulted as of yet.  Pt accompanied by: self  PERTINENT HISTORY: peripheral artery disease, history of a CVA, and history of a seizure  PAIN:  Are you having pain? Yes: NPRS scale: 5/10 Pain location: left shoulder Pain description: intermittent, hurts Aggravating factors: raising his arm  Relieving factors: rest and medication  PRECAUTIONS: Fall  RED FLAGS: None   WEIGHT BEARING RESTRICTIONS: No  FALLS: Has patient fallen in last 6 months? Yes. Number of falls 1  LIVING ENVIRONMENT: Lives with: lives with their family Lives in: House/apartment Stairs: Yes, but he primarily uses a ramp to enter and exit his home Has following equipment at home: shower chair,  Grab bars, and Ramped entry  PLOF: Independent  PATIENT GOALS: improved arm mobility  OBJECTIVE:  Note: Objective measures were completed at Evaluation unless otherwise noted.  DIAGNOSTIC FINDINGS: 01/06/24 IMPRESSION: 1. No acute traumatic injury identified. Chronic left lamina papyracea fracture. 2. Expected evolution of large Right MCA territory infarct since December with developing encephalomalacia. No acute intracranial abnormality. COGNITION: Overall cognitive status: Within functional limits for tasks assessed   SENSATION: Patient reports numbness in his left hand since his stroke in December 2024  COORDINATION: No significant deficits observed  UPPER EXTREMITY ROM:  Active ROM Right eval Left eval  Shoulder flexion 142 72; painful  Shoulder extension    Shoulder abduction 120 70; painful   Shoulder adduction    Shoulder extension    Shoulder internal rotation To L2 To sacrum  Shoulder external rotation To T3 To left ear  Elbow flexion    Elbow extension    Wrist flexion    Wrist extension    Wrist ulnar deviation    Wrist radial deviation    Wrist pronation    Wrist supination     (Blank rows = not tested)   UPPER EXTREMITY MMT:  MMT Right eval Left eval  Shoulder flexion 4+/5   Shoulder extension    Shoulder abduction 5/5   Shoulder adduction    Shoulder extension    Shoulder internal rotation 4/5   Shoulder external rotation 4+/5   Middle trapezius    Lower trapezius    Elbow flexion    Elbow extension    Wrist flexion    Wrist extension    Wrist ulnar deviation    Wrist radial deviation    Wrist pronation    Wrist supination    Grip strength 97 15   (Blank rows = not tested)   LOWER EXTREMITY MMT:    MMT Right Eval Left Eval  Hip flexion 4/5 3+/5  Hip extension    Hip abduction    Hip adduction    Hip internal rotation    Hip external rotation    Knee flexion 5/5 4+/5  Knee extension 5/5 4+/5  Ankle dorsiflexion 4+/5 4/5   Ankle plantarflexion    Ankle inversion    Ankle eversion    (Blank rows = not tested)  TRANSFERS: Assistive device utilized: None  Sit to stand: Complete Independence Stand to sit: Complete Independence  GAIT: Gait pattern: step to pattern and shuffling Assistive device utilized: None Level of assistance: SBA  FUNCTIONAL TESTS:  5 times sit to stand: 13.88 seconds without UE support  TREATMENT DATE:      05/18/24                                 EXERCISE LOG  Exercise Repetitions and Resistance Comments  Nustep Lvl 4 x 10 mins   Pulleys  5 mins   UE Ranger 6 mins multi-directional   AA Flexion Cane 3 sets of 10 reps   AA Chest press Cane 3 sets of 10 reps    Blank cell = exercise not performed today   Manual Therapy Soft Tissue Mobilization: left shoulder, STW/M to left middle deltoid and bicep to decrease pain and tone.   Modalities  Date:  Unattended Estim: Shoulder, IFC 80-150 Hz, 15 mins, Pain and Tone                                     05/11/24 EXERCISE LOG  Exercise Repetitions and Resistance Comments  Nustep  L5 x   Cybex knee flexion  50# x 3 minutes   Cybex knee extension 30# x 3 minutes    Side-Stepping 3.5 mins Airex Narrow BOS; BUE support from parallel bars  Tandem Gait 3.5 mins Airex   Rockerboard 5 mins   Seated hip ADD isometric     Ball squeeze   For shoulder IR    Blank cell = exercise not performed today  Modalities: no redness or adverse reaction to today's modalities  Date:  Unattended Estim: left deltoid, pre mod @ 80-150 Hz, 15 mins, Pain and Tone Hot Pack: Shoulder, 15 mins, Pain and Tone  04/27/24     EXERCISE LOG  Exercise Repetitions and Resistance Comments  Recumbent Bike  Lvl 3 x 17 minutes   Ball Roll Outs 3 mins   Cybex knee flexion  50# x 3 mins   Cybex knee extension  20# x 3 mins   Side stepping on  foam  BUE support from parallel bars  Rocker board   BUE support from parallel bars   Blank cell = exercise not performed today   Manual Therapy Soft Tissue Mobilization: left shoulder, STW/M to left middle deltoid and bicep to decrease pain and tone   Modalities  Date:  Unattended Estim: Shoulder, IFC 80-150 Hz, 15 mins, Pain and Tone Hot Pack: Shoulder, 15 mins, Pain and Tone                                    04/20/24 EXERCISE LOG  Exercise Repetitions and Resistance Comments  Nustep  L4 x 16 minutes   Pulleys  2 minutes   Isometric ball press  3 minutes w/ 3 second hold   Ball roll out  2.5 minutes  For horizontal ABD and ADD   AA shoulder ER  2 minutes  With cane  Cybex knee extension 30# x 2 minutes   Cybex knee flexion  50# x 2 minutes   Isometric ball squeeze  2 minutes w/ 5 second hold  For shoulder IR  Sit to stand  20 reps     Blank cell = exercise not performed today   PATIENT EDUCATION: Education details: healing and TENS Person educated: Patient Education method: Explanation Education comprehension: verbalized understanding  HOME EXERCISE PROGRAM: [id number]  GOALS: Goals reviewed with patient? Yes  SHORT TERM GOALS: Target date: 03/23/24  Patient will be independent with his initial HEP.  Baseline: Goal status: MET  2.  Patient will be able to demonstrate at least 90 degrees of left shoulder flexion for improved function picking up items from his cabinet.  Baseline: 5/5: 91 degrees 04/14/24: 95 degrees Goal status: MET  3.  Patient will be able to demonstrate at least 90 degrees of left shoulder abduction for improved function reaching.  Baseline: 75 degrees 04/14/24: 71 degrees; 5/28: 96 degrees (AA) Goal status: MET  4.  Patient will improve his left hand grip strength to at least 25 pounds for improved function grasping items.  Baseline: 5/5: 15# 04/14/24: 10# Goal status: IN PROGRESS  LONG TERM GOALS: Target date: 04/13/24  Patient will be  independent with his advanced HEP.  Baseline:  Goal status: MET  2.  Patient will improve his five time sit to stand time to 12 seconds or less to reduce his fall risk.  Baseline: 5/5: 11.7 seconds Goal status: MET  3.  Patient will be able to demonstrate at least 120 degrees of left shoulder flexion for improved function washing his hair.  Baseline: 6/18: 98 degrees AA Goal status: IN PROGRESS  4.  Patient will be able to demonstrate at least 120 degrees of left shoulder abduction for improved function reaching overhead.  Baseline: 97 degrees AA Goal status: IN PROGRESS  5.  Patient will improve his left hand grip strength to at least 40 pounds for improved function carrying groceries.  Baseline: see short term goals Goal status: IN PROGRESS  ASSESSMENT:  CLINICAL IMPRESSION:   Pt arrives for today's treatment session reporting 5/10 left shoulder and left hand pain.  Pt having to end Nustep warm up at 10 mins due to increased left shoulder pain.  Pt states that he feels that his shoulder progression has reached a plateau at this point in time.  Pt able to demonstrate 98 degrees of AA shoulder flexion and 96 degrees of AA shoulder abduction.  Pt plans to go on hold at this time.  STW/M performed to left middle deltoid and bicep to decrease pain and tone.  Normal responses to estim noted upon removal.  Pt reported minimal decrease in pain at completion of today's treatment session.   PHYSICAL THERAPY DISCHARGE SUMMARY  Visits from Start of Care: 16  Current functional level related to goals / functional outcomes: Patient was able to partially meet his goals for skilled physical therapy.    Remaining deficits: AROM and muscular strength   Education / Equipment: HEP    Patient agrees to discharge. Patient goals were partially met. Patient is being discharged due to maximized rehab potential.   Lacinda Fass, PT, DPT    OBJECTIVE IMPAIRMENTS: Abnormal gait, decreased activity  tolerance, difficulty walking, decreased ROM, decreased strength, impaired sensation, impaired UE functional use, and pain.   ACTIVITY LIMITATIONS: carrying, lifting, reach over head, and locomotion level  PARTICIPATION LIMITATIONS: meal prep, cleaning, laundry, shopping, community activity, and yard work  PERSONAL FACTORS: Past/current experiences, Time since onset of injury/illness/exacerbation, Transportation, and 3+ comorbidities: peripheral artery disease, history of a CVA, and history of a seizure are also affecting patient's functional outcome.   REHAB POTENTIAL: Good  CLINICAL DECISION MAKING: Evolving/moderate complexity  EVALUATION COMPLEXITY: Moderate  PLAN:  PT FREQUENCY: 2x/week  PT DURATION: 6 weeks  PLANNED INTERVENTIONS: 97164- PT Re-evaluation, 97110-Therapeutic exercises, 97530- Therapeutic activity, 97112- Neuromuscular re-education, 97535- Self Care, [id number]- Manual therapy, [id number]- Gait training,  [id number]- Electrical stimulation (unattended), Patient/Family education, Balance training, Cryotherapy, and Moist heat  PLAN FOR NEXT SESSION:  Nustep, UBE, continue upper and lower extremity strength LT , continue balance interventions, pulleys, AAROM, and gait training.    Delon DELENA Gosling, PTA 05/18/2024, 3:23 PM

## 2024-06-07 ENCOUNTER — Ambulatory Visit
Admission: RE | Admit: 2024-06-07 | Discharge: 2024-06-07 | Disposition: A | Discharge status: Home / Self Care | Source: Ambulatory Visit | Attending: Physical Medicine & Rehabilitation | Admitting: Physical Medicine & Rehabilitation

## 2024-06-07 ENCOUNTER — Encounter: Attending: Physical Medicine & Rehabilitation | Admitting: Physical Medicine & Rehabilitation

## 2024-06-07 ENCOUNTER — Encounter: Payer: Self-pay | Admitting: Physical Medicine & Rehabilitation

## 2024-06-07 VITALS — BP 146/77 | HR 68 | Ht 71.0 in | Wt 191.6 lb

## 2024-06-07 DIAGNOSIS — M7542 Impingement syndrome of left shoulder: Secondary | ICD-10-CM | POA: Diagnosis not present

## 2024-06-07 DIAGNOSIS — I63511 Cerebral infarction due to unspecified occlusion or stenosis of right middle cerebral artery: Secondary | ICD-10-CM | POA: Insufficient documentation

## 2024-06-07 DIAGNOSIS — M25532 Pain in left wrist: Secondary | ICD-10-CM | POA: Diagnosis present

## 2024-06-07 NOTE — Progress Notes (Signed)
 Subjective:    Patient ID: Ian Wong, male    DOB: Nov 03, 1961, 63 y.o.   MRN: [id number] 63 y.o. right-handed male with history significant for GERD hypothyroidism, remote seizure, PAD with bilateral femoral artery bypass 2018, quit smoking 7 years ago. Per chart review lives with spouse. Mobile home with a ramped entrance. Independent prior to admission. They do have a son that is disabled. Presented to [facility] 11/30/2023 with right gaze deviation and left hemiplegia/slurred speech of acute onset. Cranial CT scan showed hyperdense right M1 MCA concerning for thrombus. 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 Patient did receive TNK at [facility]. CT angiogram of the head and neck occluded right ICA at its origin with nonopacification throughout the neck. Severe right vertebral artery origin stenosis. Severe stenosis of the intradural small/nondominant left vertebral artery. Approximately 70% stenosis of the left ICA origin. Admission chemistries unremarkable except WBC 14,400 hemoglobin A1c 5.9. Patient was transferred to [facility] underwent cerebral angiogram mechanical thrombectomy as well as right carotid stenting and angioplasty 11/30/2023 per Dr. Matilde. 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  HPI  Shoulder x-ray images as well as report reviewed mild AC joint arthritis.  Patient has discontinued with outpatient PT, patient has pain with shoulder abduction and flexion greater than 70 degrees.  Review of physical therapy notes, last day of service is 05/18/2024.  It was not a discharge note.  Had ongoing goals.  According to patient they wanted MD to review patient prior to resuming therapy.  Fell at home and had bruising of Left wrist and forearm as well as increased pain, this happened 1 to 2 weeks ago and he states it is feeling better.  Pain Inventory Average Pain 6 Pain  Right Now 6 My pain is aching  In the last 24 hours, has pain interfered with the following? General activity 7 Relation with others 5 Enjoyment of life 8 What TIME of day is your pain at its worst? night Sleep (in general) Fair  Pain is worse with: unsure Pain improves with: medication Relief from Meds: 3  Family History  Problem Relation Age of Onset   COPD Mother    Other Brother    Social History   Socioeconomic History   Marital status: Married    Spouse name: Not on file   Number of children: Not on file   Years of education: Not on file   Highest education level: Not on file  Occupational History   Not on file  Tobacco Use   Smoking status: Former    Current packs/day: 0.00    Types: Cigarettes    Quit date: 12/2016    Years since quitting: 7.5   Smokeless tobacco: Never  Vaping Use   Vaping status: Never Used  Substance and Sexual Activity   Alcohol use: Not Currently    Comment: last drank etoh 1 1/2 months ago.   Drug use: No   Sexual activity: Not on file  Other Topics Concern   Not on file  Social History Narrative   Not on file   Social Drivers of Health   Financial Resource Strain: Not on file  Food Insecurity: Not on file  Transportation Needs: Not on file  Physical Activity: Not on file  Stress: Not on file  Social Connections: Not on file   Past Surgical History:  Procedure Laterality Date   AORTA - BILATERAL FEMORAL ARTERY BYPASS GRAFT  Bilateral 01/07/2017   Procedure: AORTOBIFEMORAL BYPASS GRAFT;  Surgeon: Krystal JULIANNA Doing, MD;  Location: [facility] OR;  Service: Vascular;  Laterality: Bilateral;   I & D EXTREMITY Left 11/28/2014   Procedure: IRRIGATION AND DEBRIDEMENT;  REPAIR OF CRUSH INJURY;  Surgeon: Franky Curia, MD;  Location: [facility] OR;  Service: Orthopedics;  Laterality: Left;   INCISIONAL HERNIA REPAIR N/A 10/26/2017   Procedure: HERNIA REPAIR INCISIONAL WITH MESH;  Surgeon: Mavis Anes, MD;  Location: [facility] OR[facility];  Service: General;   Laterality: N/A;   IR CT HEAD LTD  11/30/2023   IR INTRAVSC STENT CERV CAROTID W/EMB-PROT MOD SED INCL ANGIO  11/30/2023   IR PERCUTANEOUS ART THROMBECTOMY/INFUSION INTRACRANIAL INC DIAG ANGIO  11/30/2023   IR RADIOLOGIST EVAL & MGMT  03/28/2024   IR US  GUIDE VASC ACCESS RIGHT  11/30/2023   RADIOLOGY WITH ANESTHESIA N/A 11/30/2023   Procedure: IR WITH ANESTHESIA;  Surgeon: Radiologist, Medication, MD;  Location: [facility] OR;  Service: Radiology;  Laterality: N/A;   Past Surgical History:  Procedure Laterality Date   AORTA - BILATERAL FEMORAL ARTERY BYPASS GRAFT Bilateral 01/07/2017   Procedure: AORTOBIFEMORAL BYPASS GRAFT;  Surgeon: Krystal JULIANNA Doing, MD;  Location: [facility] OR;  Service: Vascular;  Laterality: Bilateral;   I & D EXTREMITY Left 11/28/2014   Procedure: IRRIGATION AND DEBRIDEMENT;  REPAIR OF CRUSH INJURY;  Surgeon: Franky Curia, MD;  Location: [facility] OR;  Service: Orthopedics;  Laterality: Left;   INCISIONAL HERNIA REPAIR N/A 10/26/2017   Procedure: HERNIA REPAIR INCISIONAL WITH MESH;  Surgeon: Mavis Anes, MD;  Location: [facility];  Service: General;  Laterality: N/A;   IR CT HEAD LTD  11/30/2023   IR INTRAVSC STENT CERV CAROTID W/EMB-PROT MOD SED INCL ANGIO  11/30/2023   IR PERCUTANEOUS ART THROMBECTOMY/INFUSION INTRACRANIAL INC DIAG ANGIO  11/30/2023   IR RADIOLOGIST EVAL & MGMT  03/28/2024   IR US  GUIDE VASC ACCESS RIGHT  11/30/2023   RADIOLOGY WITH ANESTHESIA N/A 11/30/2023   Procedure: IR WITH ANESTHESIA;  Surgeon: Radiologist, Medication, MD;  Location: [facility] OR;  Service: Radiology;  Laterality: N/A;   Past Medical History:  Diagnosis Date   GERD (gastroesophageal reflux disease)    Hypothyroidism    Seizures (HCC)    BP (!) 146/77   Pulse 68   Ht 5' 11 (1.803 m)   Wt 191 lb 9.6 oz (86.9 kg)   SpO2 95%   BMI 26.72 kg/m   Opioid Risk Score:   Fall Risk Score:  `1  Depression screen PHQ 2/9     05/17/2024    2:29 PM 01/13/2024    1:32 PM  Depression screen PHQ 2/9  Decreased  Interest 1 0  Down, Depressed, Hopeless 1 0  PHQ - 2 Score 2 0  Altered sleeping  0  Tired, decreased energy  0  Change in appetite  0  Feeling bad or failure about yourself   0  Trouble concentrating  0  Moving slowly or fidgety/restless  0  Suicidal thoughts  0  PHQ-9 Score  0  Difficult doing work/chores  Not difficult at all      Review of Systems  Musculoskeletal:        Left arm pain  All other systems reviewed and are negative.      Objective:   Physical Exam General No acute distress Mood and affect flat but otherwise unchanged Left shoulder has no evidence of significant subluxation no tenderness over the acromioclavicular joint.  Positive impingement sign at 80 degrees  there is some pain with external rotation as well with loss of range of motion of around 50% 3 -/5 left deltoid due to pain, 4 - at the bicep tricep and grip there is some limitation of pain with grip due to left wrist issues. There is mild tenderness to palpation over the distal radius.  No tenderness over the head of the radius no tenderness over the ulna there is bruising about the wrist as well as in the forearm both laterally and medially. There is minimal pain with wrist flexion extension passively or with finger flexion extension or with pronation supination. Ambulates without assistive device no evidence of toe drag or knee instability      Assessment & Plan:  1.  History of right MCA distribution infarct with residual left upper extremity right lower extremity weakness.  He still has some balance issues as evidenced by his fall a couple weeks ago.  He did injure his forearm and wrist area and there is still bruising and some tenderness around the distal radius.  No evidence of wrist effusion.  Will check wrist x-rays I have recommended a splint until swelling and bruising go down. 2.  Left shoulder pain likely multifactorial shoulder impingement syndrome plus adhesive capsulitis will repeat  glenohumeral injection with [place] Shoulder injection left glenohumeral Without ultrasound guidance)  Indication: Left shoulder pain not relieved by medication management and other conservative care.  Informed consent was obtained after describing risks and benefits of the procedure with the patient, this includes bleeding, bruising, infection and medication side effects. The patient wishes to proceed and has given written consent. Patient was placed in a seated position. The left shoulder was marked and prepped with betadine  in the subacromial area. A 25-gauge 1-1/2 inch needle was inserted into the subacromial area. After negative draw back for blood, a solution containing 1 mL of 6 mg per ML betamethasone and 4 mL of 1% lidocaine  was injected. A band aid was applied. The patient tolerated the procedure well. Post procedure instructions were given.  Return to clinic in 4 weeks

## 2024-06-07 NOTE — Patient Instructions (Addendum)
 Please wear the Left wrist brace you have at home until wrist swelling and bruising improves  Get xray at [facility] imaging [address]

## 2024-06-13 ENCOUNTER — Ambulatory Visit: Payer: Self-pay | Admitting: *Deleted

## 2024-06-13 NOTE — Telephone Encounter (Signed)
 I left message on VM per DPR.

## 2024-06-13 NOTE — Telephone Encounter (Signed)
-----   Message from Prentice FORBES Compton sent at 06/10/2024 10:51 AM EDT ----- Please inform Ian Wong , no fracture, some wrist arthritis noted ----- Message ----- From: Interface, Rad Results In Sent: 06/09/2024   5:35 PM EDT To: Prentice FORBES Compton, MD

## 2024-06-14 ENCOUNTER — Ambulatory Visit: Attending: Physical Medicine & Rehabilitation

## 2024-06-14 DIAGNOSIS — I63511 Cerebral infarction due to unspecified occlusion or stenosis of right middle cerebral artery: Secondary | ICD-10-CM | POA: Insufficient documentation

## 2024-06-14 DIAGNOSIS — M6281 Muscle weakness (generalized): Secondary | ICD-10-CM | POA: Diagnosis present

## 2024-06-14 DIAGNOSIS — M25512 Pain in left shoulder: Secondary | ICD-10-CM | POA: Insufficient documentation

## 2024-06-14 DIAGNOSIS — M25612 Stiffness of left shoulder, not elsewhere classified: Secondary | ICD-10-CM | POA: Insufficient documentation

## 2024-06-14 DIAGNOSIS — G8929 Other chronic pain: Secondary | ICD-10-CM | POA: Diagnosis present

## 2024-06-14 DIAGNOSIS — Z9181 History of falling: Secondary | ICD-10-CM | POA: Insufficient documentation

## 2024-06-14 NOTE — Therapy (Signed)
 OUTPATIENT PHYSICAL THERAPY NEURO EVALUATION   Patient Name: Ian Wong MRN: [id number] DOB:11-07-1961, 63 y.o., male Today's Date: 06/14/2024  REFERRING PROVIDER: Carilyn Prentice BRAVO, MD   END OF SESSION:  PT End of Session - 06/14/24 1349     Visit Number 1    Number of Visits 6    Date for PT Re-Evaluation 07/29/24    PT Start Time 1350    PT Stop Time 1430    PT Time Calculation (min) 40 min    Activity Tolerance Patient tolerated treatment well    Behavior During Therapy [facility] for tasks assessed/performed          Past Medical History:  Diagnosis Date   GERD (gastroesophageal reflux disease)    Hypothyroidism    Seizures (HCC)    Past Surgical History:  Procedure Laterality Date   AORTA - BILATERAL FEMORAL ARTERY BYPASS GRAFT Bilateral 01/07/2017   Procedure: AORTOBIFEMORAL BYPASS GRAFT;  Surgeon: Krystal JULIANNA Doing, MD;  Location: [facility] OR;  Service: Vascular;  Laterality: Bilateral;   I & D EXTREMITY Left 11/28/2014   Procedure: IRRIGATION AND DEBRIDEMENT;  REPAIR OF CRUSH INJURY;  Surgeon: Franky Curia, MD;  Location: [facility] OR;  Service: Orthopedics;  Laterality: Left;   INCISIONAL HERNIA REPAIR N/A 10/26/2017   Procedure: HERNIA REPAIR INCISIONAL WITH MESH;  Surgeon: Mavis Anes, MD;  Location: [facility] ORS;  Service: General;  Laterality: N/A;   IR CT HEAD LTD  11/30/2023   IR INTRAVSC STENT CERV CAROTID W/EMB-PROT MOD SED INCL ANGIO  11/30/2023   IR PERCUTANEOUS ART THROMBECTOMY/INFUSION INTRACRANIAL INC DIAG ANGIO  11/30/2023   IR RADIOLOGIST EVAL & MGMT  03/28/2024   IR US  GUIDE VASC ACCESS RIGHT  11/30/2023   RADIOLOGY WITH ANESTHESIA N/A 11/30/2023   Procedure: IR WITH ANESTHESIA;  Surgeon: Radiologist, Medication, MD;  Location: [facility] OR;  Service: Radiology;  Laterality: N/A;   Patient Active Problem List   Diagnosis Date Noted   Dysphagia, post-stroke 12/16/2023   Spastic hemiparesis 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 (HCC) 12/16/2023    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   Acute ischemic right MCA stroke (HCC) 11/30/2023   Acute right MCA stroke (HCC) 11/30/2023   Acute hypoxic respiratory failure (HCC) 11/30/2023   Acute metabolic encephalopathy 11/30/2023   Aspiration pneumonia (HCC) 11/30/2023   Incisional hernia, without obstruction or gangrene    Aortoiliac occlusive disease (HCC) 01/07/2017   Leriche syndrome (HCC)    PAD (peripheral artery disease) (HCC) 01/03/2017   Critical lower limb ischemia (HCC) 01/03/2017   Cellulitis 12/29/2016   Cellulitis of left lower extremity     ONSET DATE: 11/29/24  REFERRING DIAG: Right middle cerebral artery stroke   THERAPY DIAG:  Right middle cerebral artery stroke (HCC)  Chronic left shoulder pain  Stiffness of left shoulder, not elsewhere classified  History of falling  Rationale for Evaluation and Treatment: Rehabilitation  SUBJECTIVE:  SUBJECTIVE STATEMENT: Patient reports that he fell a few weeks while walking up stairs at home. He though he had his left hand on his railing, but when he tried to step up he lost his balance and fell.  He had a stroke on 11/30/23. He had physical therapy from 03/02/24-05/18/24. He feels that this helped some, but his left shoulder continues to bother him. He tries to do his home exercise program every day. His shoulders her a little before his stroke, but nothing like it does now. He has trouble holding onto objects with his left hand.  Pt accompanied by: self  PERTINENT HISTORY: History of a CVA, and history of seizures  PAIN:  Are you having pain? Yes: NPRS scale: Current: 5/10 Best: 0/10 Worst: 8-9/10 Pain location: left anterior shoulder Pain description: nagging, sharp pain  Aggravating factors: raising his arm, lifting   Relieving factors: injection (for a few day), topical cream  PRECAUTIONS: Fall  RED FLAGS: None   WEIGHT BEARING RESTRICTIONS: No  FALLS: Has patient fallen in last 6 months? Yes. Number of falls 1  LIVING ENVIRONMENT: Lives with: lives with their family Lives in: House/apartment Stairs: Yes: External: 6-7 steps; can reach both Has following equipment at home: shower chair, Grab bars, and Ramped entry  PLOF: Independent  PATIENT GOALS: be able to use his arm more   NEXT MD FOLLOW UP: 07/25/24  OBJECTIVE:  Note: Objective measures were completed at Evaluation unless otherwise noted.  DIAGNOSTIC FINDINGS: 05/17/24 left shoulder x-ray IMPRESSION: No acute displaced fracture or dislocation. COGNITION: Overall cognitive status: Within functional limits for tasks assessed   SENSATION: Light touch: Impaired in LUE (C6-7) and LLE (L3-S1) dermatomes  Patient reports numbness in his left hand since his stroke on 11/30/23.  MUSCLE TONE: [facility] for activities assessed  UPPER EXTREMITY ROM:  Active ROM Right eval Left eval  Shoulder flexion 127 62 limited-pain 101 (PROM)  Shoulder extension    Shoulder abduction 116 55 limited -pain 86 (PROM)   Shoulder adduction    Shoulder extension    Shoulder internal rotation    Shoulder external rotation    Elbow flexion    Elbow extension    Wrist flexion    Wrist extension    Wrist ulnar deviation    Wrist radial deviation    Wrist pronation    Wrist supination     (Blank rows = not tested)   LOWER EXTREMITY ROM: WFL for activities assessed  LOWER EXTREMITY MMT:    MMT Right Eval Left Eval  Hip flexion 4+/5 4/5  Hip extension    Hip abduction    Hip adduction    Hip internal rotation    Hip external rotation    Knee flexion 4+/5 4+/5  Knee extension 4+/5 4-/5  Ankle dorsiflexion 4+/5 4+/5  Ankle plantarflexion    Ankle inversion    Ankle eversion    (Blank rows = not tested)  JOINT MOBILITY:  Left  glenohumeral:  Inferior: WFL and nonpainful  Posterior: hypomobile and nonpainful   TRANSFERS: Sit to stand: Complete Independence  Assistive device utilized: None     Stand to sit: Complete Independence  Assistive device utilized: None      GAIT: Findings: Gait Characteristics: step to pattern and ataxic, Assistive device utilized:None, and Level of assistance: SBA  FUNCTIONAL TESTS:  5 times sit to stand: 16.69 seconds without UE support from armrests Timed up and go (TUG): 15 seconds  TREATMENT DATE:     PATIENT EDUCATION: Education details: Plan of care, objective findings, and goals for physical therapy Person educated: Patient Education method: Explanation Education comprehension: verbalized understanding  HOME EXERCISE PROGRAM:   GOALS: Goals reviewed with patient? No  LONG TERM GOALS: Target date: 07/12/24  Patient will be independent with his HEP. Baseline:  Goal status: INITIAL  2.  Patient will be able to complete his daily activities without his familiar symptoms exceeding 6/10. Baseline:  Goal status: INITIAL  3.  Patient will be able to demonstrate at least 90 degrees of active left shoulder flexion for improved function reaching. Baseline:  Goal status: INITIAL  4.  Patient will be able to carry at least 5 pounds in his left upper extremity for improved function carrying groceries. Baseline:  Goal status: INITIAL  5.  Patient will improve his 5 times sit to stand time to 12 seconds or less to reduce his fall risk. Baseline:  Goal status: INITIAL  6.  Patient will improve his timed up and go time to 12 seconds or less to reduce his fall risk. Baseline:  Goal status: INITIAL  ASSESSMENT:  CLINICAL IMPRESSION: Patient is a 64 y.o. male who was seen today for physical therapy evaluation and treatment for chronic left shoulder pain and a history of  falling secondary to a stroke on 11/30/23.  He presented with moderate left shoulder pain and irritability with left shoulder active range of motion being the most aggravating to his familiar symptoms.  He is also a high risk of falling as evidenced by his gait mechanics, objective measures, functional testing, and his history of falling.  Recommend that he continue with skilled physical therapy to address his impairments to maximize his safety and functional mobility.  OBJECTIVE IMPAIRMENTS: Abnormal gait, decreased balance, decreased mobility, difficulty walking, decreased ROM, decreased strength, hypomobility, impaired flexibility, impaired sensation, impaired tone, impaired UE functional use, and pain.   ACTIVITY LIMITATIONS: carrying, lifting, stairs, transfers, reach over head, and locomotion level  PARTICIPATION LIMITATIONS: meal prep, cleaning, community activity, and yard work  PERSONAL FACTORS: Past/current experiences, Time since onset of injury/illness/exacerbation, and 3+ comorbidities: History of a CVA, and history of seizures are also affecting patient's functional outcome.   REHAB POTENTIAL: Fair    CLINICAL DECISION MAKING: Evolving/moderate complexity  EVALUATION COMPLEXITY: Moderate  PLAN:  PT FREQUENCY: 1-2x/week  PT DURATION: 4 weeks  PLANNED INTERVENTIONS: 97164- PT Re-evaluation, 97750- Physical Performance Testing, 97110-Therapeutic exercises, 97530- Therapeutic activity, [id number]- Neuromuscular re-education, 97535- Self Care, 02859- Manual therapy, [phone]- Gait training, [phone]- Electrical stimulation (unattended), 97016- Vasopneumatic device, Patient/Family education, Balance training, Stair training, Joint mobilization, Cryotherapy, and Moist heat  PLAN FOR NEXT SESSION: NuStep, isometrics, active assisted range of motion, manual therapy, and modalities as needed   Lacinda JAYSON Fass, PT 06/14/2024, 5:25 PM

## 2024-06-16 ENCOUNTER — Ambulatory Visit

## 2024-06-16 DIAGNOSIS — M25612 Stiffness of left shoulder, not elsewhere classified: Secondary | ICD-10-CM

## 2024-06-16 DIAGNOSIS — G8929 Other chronic pain: Secondary | ICD-10-CM

## 2024-06-16 DIAGNOSIS — M6281 Muscle weakness (generalized): Secondary | ICD-10-CM

## 2024-06-16 DIAGNOSIS — I63511 Cerebral infarction due to unspecified occlusion or stenosis of right middle cerebral artery: Secondary | ICD-10-CM | POA: Diagnosis not present

## 2024-06-16 DIAGNOSIS — Z9181 History of falling: Secondary | ICD-10-CM

## 2024-06-16 NOTE — Therapy (Signed)
 OUTPATIENT PHYSICAL THERAPY NEURO EVALUATION   Patient Name: Ian Wong MRN: [id number] DOB:[birth year], 63 y.o., male Today's Date: 06/16/2024  REFERRING PROVIDER: Carilyn Prentice BRAVO, MD   END OF SESSION:  PT End of Session - 06/16/24 1353     Visit Number 2    Number of Visits 6    Date for PT Re-Evaluation 07/29/24    PT Start Time 1345    PT Stop Time 1430    PT Time Calculation (min) 45 min    Activity Tolerance Patient tolerated treatment well    Behavior During Therapy [facility] for tasks assessed/performed          Past Medical History:  Diagnosis Date   GERD (gastroesophageal reflux disease)    Hypothyroidism    Seizures (HCC)    Past Surgical History:  Procedure Laterality Date   AORTA - BILATERAL FEMORAL ARTERY BYPASS GRAFT Bilateral 01/07/2017   Procedure: AORTOBIFEMORAL BYPASS GRAFT;  Surgeon: Krystal JULIANNA Doing, MD;  Location: [facility] OR;  Service: Vascular;  Laterality: Bilateral;   I & D EXTREMITY Left 11/28/2014   Procedure: IRRIGATION AND DEBRIDEMENT;  REPAIR OF CRUSH INJURY;  Surgeon: Franky Curia, MD;  Location: [facility] OR;  Service: Orthopedics;  Laterality: Left;   INCISIONAL HERNIA REPAIR N/A 10/26/2017   Procedure: HERNIA REPAIR INCISIONAL WITH MESH;  Surgeon: Mavis Anes, MD;  Location: [facility] ORS;  Service: General;  Laterality: N/A;   IR CT HEAD LTD  11/30/2023   IR INTRAVSC STENT CERV CAROTID W/EMB-PROT MOD SED INCL ANGIO  11/30/2023   IR PERCUTANEOUS ART THROMBECTOMY/INFUSION INTRACRANIAL INC DIAG ANGIO  11/30/2023   IR RADIOLOGIST EVAL & MGMT  03/28/2024   IR US  GUIDE VASC ACCESS RIGHT  11/30/2023   RADIOLOGY WITH ANESTHESIA N/A 11/30/2023   Procedure: IR WITH ANESTHESIA;  Surgeon: Radiologist, Medication, MD;  Location: [facility] OR;  Service: Radiology;  Laterality: N/A;   Patient Active Problem List   Diagnosis Date Noted   Dysphagia, post-stroke 12/16/2023   Spastic hemiparesis 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 (HCC) 12/16/2023    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   Acute ischemic right MCA stroke (HCC) 11/30/2023   Acute right MCA stroke (HCC) 11/30/2023   Acute hypoxic respiratory failure (HCC) 11/30/2023   Acute metabolic encephalopathy 11/30/2023   Aspiration pneumonia (HCC) 11/30/2023   Incisional hernia, without obstruction or gangrene    Aortoiliac occlusive disease (HCC) 01/07/2017   Leriche syndrome (HCC)    PAD (peripheral artery disease) (HCC) 01/03/2017   Critical lower limb ischemia (HCC) 01/03/2017   Cellulitis 12/29/2016   Cellulitis of left lower extremity     ONSET DATE: 11/29/24  REFERRING DIAG: Right middle cerebral artery stroke   THERAPY DIAG:  Right middle cerebral artery stroke (HCC)  Chronic left shoulder pain  Stiffness of left shoulder, not elsewhere classified  History of falling  Muscle weakness (generalized)  Rationale for Evaluation and Treatment: Rehabilitation  SUBJECTIVE:  SUBJECTIVE STATEMENT: Pt reports 5/10 left shoulder pain today.  Pt accompanied by: self  PERTINENT HISTORY: History of a CVA, and history of seizures  PAIN:  Are you having pain? Yes: NPRS scale: 5/10 Pain location: left anterior shoulder Pain description: nagging, sharp pain  Aggravating factors: raising his arm, lifting  Relieving factors: injection (for a few day), topical cream  PRECAUTIONS: Fall  RED FLAGS: None   WEIGHT BEARING RESTRICTIONS: No  FALLS: Has patient fallen in last 6 months? Yes. Number of falls 1  LIVING ENVIRONMENT: Lives with: lives with their family Lives in: House/apartment Stairs: Yes: External: 6-7 steps; can reach both Has following equipment at home: shower chair, Grab bars, and Ramped entry  PLOF: Independent  PATIENT GOALS: be able to use his  arm more   NEXT MD FOLLOW UP: 07/25/24  OBJECTIVE:  Note: Objective measures were completed at Evaluation unless otherwise noted.  DIAGNOSTIC FINDINGS: 05/17/24 left shoulder x-ray IMPRESSION: No acute displaced fracture or dislocation. COGNITION: Overall cognitive status: Within functional limits for tasks assessed   SENSATION: Light touch: Impaired in LUE (C6-7) and LLE (L3-S1) dermatomes  Patient reports numbness in his left hand since his stroke on 11/30/23.  MUSCLE TONE: [facility] for activities assessed  UPPER EXTREMITY ROM:  Active ROM Right eval Left eval  Shoulder flexion 127 62 limited-pain 101 (PROM)  Shoulder extension    Shoulder abduction 116 55 limited -pain 86 (PROM)   Shoulder adduction    Shoulder extension    Shoulder internal rotation    Shoulder external rotation    Elbow flexion    Elbow extension    Wrist flexion    Wrist extension    Wrist ulnar deviation    Wrist radial deviation    Wrist pronation    Wrist supination     (Blank rows = not tested)   LOWER EXTREMITY ROM: WFL for activities assessed  LOWER EXTREMITY MMT:    MMT Right Eval Left Eval  Hip flexion 4+/5 4/5  Hip extension    Hip abduction    Hip adduction    Hip internal rotation    Hip external rotation    Knee flexion 4+/5 4+/5  Knee extension 4+/5 4-/5  Ankle dorsiflexion 4+/5 4+/5  Ankle plantarflexion    Ankle inversion    Ankle eversion    (Blank rows = not tested)  JOINT MOBILITY:  Left glenohumeral:  Inferior: WFL and nonpainful  Posterior: hypomobile and nonpainful   TRANSFERS: Sit to stand: Complete Independence  Assistive device utilized: None     Stand to sit: Complete Independence  Assistive device utilized: None      GAIT: Findings: Gait Characteristics: step to pattern and ataxic, Assistive device utilized:None, and Level of assistance: SBA  FUNCTIONAL TESTS:  5 times sit to stand: 16.69 seconds without UE support from armrests Timed up and go  (TUG): 15 seconds                                                                                                       TREATMENT DATE:  06/16/24                                  EXERCISE LOG  Exercise Repetitions and Resistance Comments  Nustep Lvl 2 x 15 mins   AA Flexion Cane 2 sets of 10 reps   AA Chest Press Cane 2 sets of 10 reps    2-way Bicep Curls 1# x 20 reps each   LAQs 5# x 20 reps bil   Seated Marches 5# x 20 reps bil    Blank cell = exercise not performed today   PATIENT EDUCATION: Education details: Plan of care, objective findings, and goals for physical therapy Person educated: Patient Education method: Explanation Education comprehension: verbalized understanding  HOME EXERCISE PROGRAM:   GOALS: Goals reviewed with patient? No  LONG TERM GOALS: Target date: 07/12/24  Patient will be independent with his HEP. Baseline:  Goal status: INITIAL  2.  Patient will be able to complete his daily activities without his familiar symptoms exceeding 6/10. Baseline:  Goal status: INITIAL  3.  Patient will be able to demonstrate at least 90 degrees of active left shoulder flexion for improved function reaching. Baseline:  Goal status: INITIAL  4.  Patient will be able to carry at least 5 pounds in his left upper extremity for improved function carrying groceries. Baseline:  Goal status: INITIAL  5.  Patient will improve his 5 times sit to stand time to 12 seconds or less to reduce his fall risk. Baseline:  Goal status: INITIAL  6.  Patient will improve his timed up and go time to 12 seconds or less to reduce his fall risk. Baseline:  Goal status: INITIAL  ASSESSMENT:  CLINICAL IMPRESSION: Pt arrives for today's treatment session reporting 5/10 left shoulder pain.  Small piece of theraband used to assist with left hand staying on cane for AA exercises.  Pt comfortable with use of theraband.  Pt requiring cues to avoid performing lumbar extension with each  reps of AA flexion.  Pt given seated rest breaks as needed due to fatigue.  Pt states that he is using pulleys at home daily.  Pt reported minimal decrease in left shoulder pain at completion of today's treatment session.    OBJECTIVE IMPAIRMENTS: Abnormal gait, decreased balance, decreased mobility, difficulty walking, decreased ROM, decreased strength, hypomobility, impaired flexibility, impaired sensation, impaired tone, impaired UE functional use, and pain.   ACTIVITY LIMITATIONS: carrying, lifting, stairs, transfers, reach over head, and locomotion level  PARTICIPATION LIMITATIONS: meal prep, cleaning, community activity, and yard work  PERSONAL FACTORS: Past/current experiences, Time since onset of injury/illness/exacerbation, and 3+ comorbidities: History of a CVA, and history of seizures are also affecting patient's functional outcome.   REHAB POTENTIAL: Fair    CLINICAL DECISION MAKING: Evolving/moderate complexity  EVALUATION COMPLEXITY: Moderate  PLAN:  PT FREQUENCY: 1-2x/week  PT DURATION: 4 weeks  PLANNED INTERVENTIONS: 02835- PT Re-evaluation, 97750- Physical Performance Testing, 97110-Therapeutic exercises, 97530- Therapeutic activity, [id number]- Neuromuscular re-education, 97535- Self Care, 02859- Manual therapy, [phone]- Gait training, [phone]- Electrical stimulation (unattended), 97016- Vasopneumatic device, Patient/Family education, Balance training, Stair training, Joint mobilization, Cryotherapy, and Moist heat  PLAN FOR NEXT SESSION: NuStep, isometrics, active assisted range of motion, manual therapy, and modalities as needed   Delon DELENA Gosling, PTA 06/16/2024, 3:40 PM

## 2024-06-20 ENCOUNTER — Ambulatory Visit

## 2024-06-20 DIAGNOSIS — M25612 Stiffness of left shoulder, not elsewhere classified: Secondary | ICD-10-CM

## 2024-06-20 DIAGNOSIS — G8929 Other chronic pain: Secondary | ICD-10-CM

## 2024-06-20 DIAGNOSIS — I63511 Cerebral infarction due to unspecified occlusion or stenosis of right middle cerebral artery: Secondary | ICD-10-CM

## 2024-06-20 DIAGNOSIS — M6281 Muscle weakness (generalized): Secondary | ICD-10-CM

## 2024-06-20 DIAGNOSIS — Z9181 History of falling: Secondary | ICD-10-CM

## 2024-06-20 NOTE — Therapy (Signed)
 OUTPATIENT PHYSICAL THERAPY NEURO TREATMENT   Patient Name: Ian Wong MRN: [id number] DOB:08-17-1961, 63 y.o., male Today's Date: 06/20/2024  REFERRING PROVIDER: Carilyn Prentice BRAVO, MD   END OF SESSION:  PT End of Session - 06/20/24 1439     Visit Number 3    Number of Visits 6    Date for PT Re-Evaluation 07/29/24    PT Start Time 1430    PT Stop Time 1515    PT Time Calculation (min) 45 min    Activity Tolerance Patient tolerated treatment well    Behavior During Therapy [facility] for tasks assessed/performed          Past Medical History:  Diagnosis Date   GERD (gastroesophageal reflux disease)    Hypothyroidism    Seizures (HCC)    Past Surgical History:  Procedure Laterality Date   AORTA - BILATERAL FEMORAL ARTERY BYPASS GRAFT Bilateral 01/07/2017   Procedure: AORTOBIFEMORAL BYPASS GRAFT;  Surgeon: Krystal JULIANNA Doing, MD;  Location: [facility] OR;  Service: Vascular;  Laterality: Bilateral;   I & D EXTREMITY Left 11/28/2014   Procedure: IRRIGATION AND DEBRIDEMENT;  REPAIR OF CRUSH INJURY;  Surgeon: Franky Curia, MD;  Location: [facility] OR;  Service: Orthopedics;  Laterality: Left;   INCISIONAL HERNIA REPAIR N/A 10/26/2017   Procedure: HERNIA REPAIR INCISIONAL WITH MESH;  Surgeon: Mavis Anes, MD;  Location: [facility] ORS;  Service: General;  Laterality: N/A;   IR CT HEAD LTD  11/30/2023   IR INTRAVSC STENT CERV CAROTID W/EMB-PROT MOD SED INCL ANGIO  11/30/2023   IR PERCUTANEOUS ART THROMBECTOMY/INFUSION INTRACRANIAL INC DIAG ANGIO  11/30/2023   IR RADIOLOGIST EVAL & MGMT  03/28/2024   IR US  GUIDE VASC ACCESS RIGHT  11/30/2023   RADIOLOGY WITH ANESTHESIA N/A 11/30/2023   Procedure: IR WITH ANESTHESIA;  Surgeon: Radiologist, Medication, MD;  Location: [facility] OR;  Service: Radiology;  Laterality: N/A;   Patient Active Problem List   Diagnosis Date Noted   Dysphagia, post-stroke 12/16/2023   Spastic hemiparesis 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 (HCC) 12/16/2023   Right  middle cerebral artery stroke (HCC) 12/11/2023   Acute ischemic right MCA stroke (HCC) 11/30/2023   Acute right MCA stroke (HCC) 11/30/2023   Acute hypoxic respiratory failure (HCC) 11/30/2023   Acute metabolic encephalopathy 11/30/2023   Aspiration pneumonia (HCC) 11/30/2023   Incisional hernia, without obstruction or gangrene    Aortoiliac occlusive disease (HCC) 01/07/2017   Leriche syndrome (HCC)    PAD (peripheral artery disease) (HCC) 01/03/2017   Critical lower limb ischemia (HCC) 01/03/2017   Cellulitis 12/29/2016   Cellulitis of left lower extremity     ONSET DATE: 11/29/24  REFERRING DIAG: Right middle cerebral artery stroke   THERAPY DIAG:  Right middle cerebral artery stroke (HCC)  Chronic left shoulder pain  Stiffness of left shoulder, not elsewhere classified  History of falling  Muscle weakness (generalized)  Rationale for Evaluation and Treatment: Rehabilitation  SUBJECTIVE:  SUBJECTIVE STATEMENT: Pt reports 5/10 left shoulder pain today.  Pt accompanied by: self  PERTINENT HISTORY: History of a CVA, and history of seizures  PAIN:  Are you having pain? Yes: NPRS scale: 5/10 Pain location: left anterior shoulder Pain description: nagging, sharp pain  Aggravating factors: raising his arm, lifting  Relieving factors: injection (for a few day), topical cream  PRECAUTIONS: Fall  RED FLAGS: None   WEIGHT BEARING RESTRICTIONS: No  FALLS: Has patient fallen in last 6 months? Yes. Number of falls 1  LIVING ENVIRONMENT: Lives with: lives with their family Lives in: House/apartment Stairs: Yes: External: 6-7 steps; can reach both Has following equipment at home: shower chair, Grab bars, and Ramped entry  PLOF: Independent  PATIENT GOALS: be able to use his arm  more   NEXT MD FOLLOW UP: 07/25/24  OBJECTIVE:  Note: Objective measures were completed at Evaluation unless otherwise noted.  DIAGNOSTIC FINDINGS: 05/17/24 left shoulder x-ray IMPRESSION: No acute displaced fracture or dislocation. COGNITION: Overall cognitive status: Within functional limits for tasks assessed   SENSATION: Light touch: Impaired in LUE (C6-7) and LLE (L3-S1) dermatomes  Patient reports numbness in his left hand since his stroke on 11/30/23.  MUSCLE TONE: [facility] for activities assessed  UPPER EXTREMITY ROM:  Active ROM Right eval Left eval  Shoulder flexion 127 62 limited-pain 101 (PROM)  Shoulder extension    Shoulder abduction 116 55 limited -pain 86 (PROM)   Shoulder adduction    Shoulder extension    Shoulder internal rotation    Shoulder external rotation    Elbow flexion    Elbow extension    Wrist flexion    Wrist extension    Wrist ulnar deviation    Wrist radial deviation    Wrist pronation    Wrist supination     (Blank rows = not tested)   LOWER EXTREMITY ROM: WFL for activities assessed  LOWER EXTREMITY MMT:    MMT Right Eval Left Eval  Hip flexion 4+/5 4/5  Hip extension    Hip abduction    Hip adduction    Hip internal rotation    Hip external rotation    Knee flexion 4+/5 4+/5  Knee extension 4+/5 4-/5  Ankle dorsiflexion 4+/5 4+/5  Ankle plantarflexion    Ankle inversion    Ankle eversion    (Blank rows = not tested)  JOINT MOBILITY:  Left glenohumeral:  Inferior: WFL and nonpainful  Posterior: hypomobile and nonpainful   TRANSFERS: Sit to stand: Complete Independence  Assistive device utilized: None     Stand to sit: Complete Independence  Assistive device utilized: None      GAIT: Findings: Gait Characteristics: step to pattern and ataxic, Assistive device utilized:None, and Level of assistance: SBA  FUNCTIONAL TESTS:  5 times sit to stand: 16.69 seconds without UE support from armrests Timed up and go (TUG):  15 seconds                                                                                                       TREATMENT DATE:  06/01/24                                  EXERCISE LOG  Exercise Repetitions and Resistance Comments  Nustep Lvl 2 x 15 mins   AA Flexion Cane 2 sets of 15 reps   AA Chest Press Cane 2 sets of 15 reps    2-way Bicep Curls 1# x 25 reps each   Ball Squeezes 3 mins   LAQs 5# x 25 reps bil   Seated Marches 5# x 25 reps bil   Seated Ham Curls Green x 25 reps bil   STS  5 reps; eccentric control    Blank cell = exercise not performed today   PATIENT EDUCATION: Education details: Plan of care, objective findings, and goals for physical therapy Person educated: Patient Education method: Explanation Education comprehension: verbalized understanding  HOME EXERCISE PROGRAM:   GOALS: Goals reviewed with patient? No  LONG TERM GOALS: Target date: 07/12/24  Patient will be independent with his HEP. Baseline:  Goal status: INITIAL  2.  Patient will be able to complete his daily activities without his familiar symptoms exceeding 6/10. Baseline:  Goal status: INITIAL  3.  Patient will be able to demonstrate at least 90 degrees of active left shoulder flexion for improved function reaching. Baseline:  Goal status: INITIAL  4.  Patient will be able to carry at least 5 pounds in his left upper extremity for improved function carrying groceries. Baseline:  Goal status: INITIAL  5.  Patient will improve his 5 times sit to stand time to 12 seconds or less to reduce his fall risk. Baseline:  Goal status: INITIAL  6.  Patient will improve his timed up and go time to 12 seconds or less to reduce his fall risk. Baseline:  Goal status: INITIAL  ASSESSMENT:  CLINICAL IMPRESSION: Pt arrives for today's treatment session reporting 5/10 left shoulder pain.  Pt able to tolerate increased reps with all exercises performed today.  Pt introduced to seated ham curls  with cues required for eccentric control.  Pt able to perform all STS transfers without use of UE support.  Urged pt to make appointment with orthopedic about his shoulder.  Also discussed use and purchase of compression glove.  Pt reported minimal decrease in left shoulder pain at completion of today's treatment session.  OBJECTIVE IMPAIRMENTS: Abnormal gait, decreased balance, decreased mobility, difficulty walking, decreased ROM, decreased strength, hypomobility, impaired flexibility, impaired sensation, impaired tone, impaired UE functional use, and pain.   ACTIVITY LIMITATIONS: carrying, lifting, stairs, transfers, reach over head, and locomotion level  PARTICIPATION LIMITATIONS: meal prep, cleaning, community activity, and yard work  PERSONAL FACTORS: Past/current experiences, Time since onset of injury/illness/exacerbation, and 3+ comorbidities: History of a CVA, and history of seizures are also affecting patient's functional outcome.   REHAB POTENTIAL: Fair    CLINICAL DECISION MAKING: Evolving/moderate complexity  EVALUATION COMPLEXITY: Moderate  PLAN:  PT FREQUENCY: 1-2x/week  PT DURATION: 4 weeks  PLANNED INTERVENTIONS: 02835- PT Re-evaluation, 97750- Physical Performance Testing, 97110-Therapeutic exercises, 97530- Therapeutic activity, [id number]- Neuromuscular re-education, 97535- Self Care, 02859- Manual therapy, [phone]- Gait training, [phone]- Electrical stimulation (unattended), 97016- Vasopneumatic device, Patient/Family education, Balance training, Stair training, Joint mobilization, Cryotherapy, and Moist heat  PLAN FOR NEXT SESSION: NuStep, isometrics, active assisted range of motion, manual therapy, and modalities as needed   Ian Wong, PTA 06/20/2024, 3:24 PM

## 2024-06-22 ENCOUNTER — Ambulatory Visit

## 2024-06-22 DIAGNOSIS — I63511 Cerebral infarction due to unspecified occlusion or stenosis of right middle cerebral artery: Secondary | ICD-10-CM | POA: Diagnosis not present

## 2024-06-22 DIAGNOSIS — Z9181 History of falling: Secondary | ICD-10-CM

## 2024-06-22 DIAGNOSIS — G8929 Other chronic pain: Secondary | ICD-10-CM

## 2024-06-22 DIAGNOSIS — M25612 Stiffness of left shoulder, not elsewhere classified: Secondary | ICD-10-CM

## 2024-06-22 DIAGNOSIS — M6281 Muscle weakness (generalized): Secondary | ICD-10-CM

## 2024-06-22 NOTE — Therapy (Signed)
 OUTPATIENT PHYSICAL THERAPY NEURO TREATMENT   Patient Name: Ian Wong MRN: [id number] DOB:11-Apr-1961, 63 y.o., male Today's Date: 06/22/2024  REFERRING PROVIDER: Carilyn Prentice BRAVO, MD   END OF SESSION:  PT End of Session - 06/22/24 1350     Visit Number 4    Number of Visits 6    Date for PT Re-Evaluation 07/29/24    PT Start Time 1345    Activity Tolerance Patient tolerated treatment well    Behavior During Therapy [facility] for tasks assessed/performed          Past Medical History:  Diagnosis Date   GERD (gastroesophageal reflux disease)    Hypothyroidism    Seizures (HCC)    Past Surgical History:  Procedure Laterality Date   AORTA - BILATERAL FEMORAL ARTERY BYPASS GRAFT Bilateral 01/07/2017   Procedure: AORTOBIFEMORAL BYPASS GRAFT;  Surgeon: Krystal JULIANNA Doing, MD;  Location: [facility] OR;  Service: Vascular;  Laterality: Bilateral;   I & D EXTREMITY Left 11/28/2014   Procedure: IRRIGATION AND DEBRIDEMENT;  REPAIR OF CRUSH INJURY;  Surgeon: Franky Curia, MD;  Location: [facility] OR;  Service: Orthopedics;  Laterality: Left;   INCISIONAL HERNIA REPAIR N/A 10/26/2017   Procedure: HERNIA REPAIR INCISIONAL WITH MESH;  Surgeon: Mavis Anes, MD;  Location: [facility] ORS;  Service: General;  Laterality: N/A;   IR CT HEAD LTD  11/30/2023   IR INTRAVSC STENT CERV CAROTID W/EMB-PROT MOD SED INCL ANGIO  11/30/2023   IR PERCUTANEOUS ART THROMBECTOMY/INFUSION INTRACRANIAL INC DIAG ANGIO  11/30/2023   IR RADIOLOGIST EVAL & MGMT  03/28/2024   IR US  GUIDE VASC ACCESS RIGHT  11/30/2023   RADIOLOGY WITH ANESTHESIA N/A 11/30/2023   Procedure: IR WITH ANESTHESIA;  Surgeon: Radiologist, Medication, MD;  Location: [facility] OR;  Service: Radiology;  Laterality: N/A;   Patient Active Problem List   Diagnosis Date Noted   Dysphagia, post-stroke 12/16/2023   Spastic hemiparesis 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 (HCC) 12/16/2023   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   Acute  ischemic right MCA stroke (HCC) 11/30/2023   Acute right MCA stroke (HCC) 11/30/2023   Acute hypoxic respiratory failure (HCC) 11/30/2023   Acute metabolic encephalopathy 11/30/2023   Aspiration pneumonia (HCC) 11/30/2023   Incisional hernia, without obstruction or gangrene    Aortoiliac occlusive disease (HCC) 01/07/2017   Leriche syndrome (HCC)    PAD (peripheral artery disease) (HCC) 01/03/2017   Critical lower limb ischemia (HCC) 01/03/2017   Cellulitis 12/29/2016   Cellulitis of left lower extremity     ONSET DATE: 11/29/24  REFERRING DIAG: Right middle cerebral artery stroke   THERAPY DIAG:  Right middle cerebral artery stroke (HCC)  Chronic left shoulder pain  Stiffness of left shoulder, not elsewhere classified  History of falling  Muscle weakness (generalized)  Rationale for Evaluation and Treatment: Rehabilitation  SUBJECTIVE:  SUBJECTIVE STATEMENT: Pt reports 5/10 left shoulder pain today.  Pt accompanied by: self  PERTINENT HISTORY: History of a CVA, and history of seizures  PAIN:  Are you having pain? Yes: NPRS scale: 5/10 Pain location: left anterior shoulder Pain description: nagging, sharp pain  Aggravating factors: raising his arm, lifting  Relieving factors: injection (for a few day), topical cream  PRECAUTIONS: Fall  RED FLAGS: None   WEIGHT BEARING RESTRICTIONS: No  FALLS: Has patient fallen in last 6 months? Yes. Number of falls 1  LIVING ENVIRONMENT: Lives with: lives with their family Lives in: House/apartment Stairs: Yes: External: 6-7 steps; can reach both Has following equipment at home: shower chair, Grab bars, and Ramped entry  PLOF: Independent  PATIENT GOALS: be able to use his arm more   NEXT MD FOLLOW UP: 07/25/24  OBJECTIVE:  Note:  Objective measures were completed at Evaluation unless otherwise noted.  DIAGNOSTIC FINDINGS: 05/17/24 left shoulder x-ray IMPRESSION: No acute displaced fracture or dislocation. COGNITION: Overall cognitive status: Within functional limits for tasks assessed   SENSATION: Light touch: Impaired in LUE (C6-7) and LLE (L3-S1) dermatomes  Patient reports numbness in his left hand since his stroke on 11/30/23.  MUSCLE TONE: [facility] for activities assessed  UPPER EXTREMITY ROM:  Active ROM Right eval Left eval  Shoulder flexion 127 62 limited-pain 101 (PROM)  Shoulder extension    Shoulder abduction 116 55 limited -pain 86 (PROM)   Shoulder adduction    Shoulder extension    Shoulder internal rotation    Shoulder external rotation    Elbow flexion    Elbow extension    Wrist flexion    Wrist extension    Wrist ulnar deviation    Wrist radial deviation    Wrist pronation    Wrist supination     (Blank rows = not tested)   LOWER EXTREMITY ROM: WFL for activities assessed  LOWER EXTREMITY MMT:    MMT Right Eval Left Eval  Hip flexion 4+/5 4/5  Hip extension    Hip abduction    Hip adduction    Hip internal rotation    Hip external rotation    Knee flexion 4+/5 4+/5  Knee extension 4+/5 4-/5  Ankle dorsiflexion 4+/5 4+/5  Ankle plantarflexion    Ankle inversion    Ankle eversion    (Blank rows = not tested)  JOINT MOBILITY:  Left glenohumeral:  Inferior: WFL and nonpainful  Posterior: hypomobile and nonpainful   TRANSFERS: Sit to stand: Complete Independence  Assistive device utilized: None     Stand to sit: Complete Independence  Assistive device utilized: None      GAIT: Findings: Gait Characteristics: step to pattern and ataxic, Assistive device utilized:None, and Level of assistance: SBA  FUNCTIONAL TESTS:  5 times sit to stand: 16.69 seconds without UE support from armrests Timed up and go (TUG): 15 seconds                                                                                                        TREATMENT DATE:  06/22/24                                  EXERCISE LOG  Exercise Repetitions and Resistance Comments  Nustep Lvl 3 x 15 mins   AA Flexion Cane 1# 2 sets of 10 reps   AA Chest Press Cane 1# 2 sets of 5 reps   2-way Bicep Curls 2# x 20 reps each   Sword Fighting Airex x 2 mins; CGA   LAQs 5# x 25 reps bil   Seated Marches 5# x 25 reps bil   Seated Ham Curls    STS     Goal Assessment See Below    Blank cell = exercise not performed today   PATIENT EDUCATION: Education details: Plan of care, objective findings, and goals for physical therapy Person educated: Patient Education method: Explanation Education comprehension: verbalized understanding  HOME EXERCISE PROGRAM:   GOALS: Goals reviewed with patient? No  LONG TERM GOALS: Target date: 07/12/24  Patient will be independent with his HEP. Baseline:  Goal status: IN PROGRESS  2.  Patient will be able to complete his daily activities without his familiar symptoms exceeding 6/10. Baseline:  Goal status: IN PROGRESS  3.  Patient will be able to demonstrate at least 90 degrees of active left shoulder flexion for improved function reaching. Baseline: 7/23: 84 degrees Goal status: IN PROGRESS  4.  Patient will be able to carry at least 5 pounds in his left upper extremity for improved function carrying groceries. Baseline:  Goal status: MET  5.  Patient will improve his 5 times sit to stand time to 12 seconds or less to reduce his fall risk. Baseline: 7/23: 14.23 seconds Goal status: IN PROGRESS  6.  Patient will improve his timed up and go time to 12 seconds or less to reduce his fall risk. Baseline: 7/23: 11.78 seconds Goal status: MET  ASSESSMENT:  CLINICAL IMPRESSION: Pt arrives for today's treatment session reporting 4-5/10 left shoulder pain.  Pt able to perform TUG in 11.78 seconds and 5 STS in 14.23 seconds, meeting his TUG goal and  making progress towards his 5 STS goal.  Pt able to demonstrate 84 degrees of active left shoulder flexion.  Pt able to carry a 5 pound weight in a grocery bag without difficulty for improved functional activities at home.  Pt requiring tactile cues to avoid compensatory leaning with AA shoulder flexion with cane.  Much improved technique with tactile cues.  Pt reported minimal decrease in left shoulder pain at completion of today's treatment session.   OBJECTIVE IMPAIRMENTS: Abnormal gait, decreased balance, decreased mobility, difficulty walking, decreased ROM, decreased strength, hypomobility, impaired flexibility, impaired sensation, impaired tone, impaired UE functional use, and pain.   ACTIVITY LIMITATIONS: carrying, lifting, stairs, transfers, reach over head, and locomotion level  PARTICIPATION LIMITATIONS: meal prep, cleaning, community activity, and yard work  PERSONAL FACTORS: Past/current experiences, Time since onset of injury/illness/exacerbation, and 3+ comorbidities: History of a CVA, and history of seizures are also affecting patient's functional outcome.   REHAB POTENTIAL: Fair    CLINICAL DECISION MAKING: Evolving/moderate complexity  EVALUATION COMPLEXITY: Moderate  PLAN:  PT FREQUENCY: 1-2x/week  PT DURATION: 4 weeks  PLANNED INTERVENTIONS: 97164- PT Re-evaluation, 97750- Physical Performance Testing, 97110-Therapeutic exercises, 97530- Therapeutic activity, [id number]- Neuromuscular re-education, 97535- Self Care, 02859- Manual therapy, [phone]- Gait training, [phone]- Electrical stimulation (unattended), [phone]- Vasopneumatic device, Patient/Family education, Balance training, Stair training, Joint  mobilization, Cryotherapy, and Moist heat  PLAN FOR NEXT SESSION: NuStep, isometrics, active assisted range of motion, manual therapy, and modalities as needed   Delon DELENA Gosling, PTA 06/22/2024, 3:15 PM

## 2024-06-28 ENCOUNTER — Ambulatory Visit

## 2024-06-28 DIAGNOSIS — M25612 Stiffness of left shoulder, not elsewhere classified: Secondary | ICD-10-CM

## 2024-06-28 DIAGNOSIS — Z9181 History of falling: Secondary | ICD-10-CM

## 2024-06-28 DIAGNOSIS — I63511 Cerebral infarction due to unspecified occlusion or stenosis of right middle cerebral artery: Secondary | ICD-10-CM

## 2024-06-28 DIAGNOSIS — M6281 Muscle weakness (generalized): Secondary | ICD-10-CM

## 2024-06-28 DIAGNOSIS — G8929 Other chronic pain: Secondary | ICD-10-CM

## 2024-06-28 NOTE — Therapy (Signed)
 OUTPATIENT PHYSICAL THERAPY NEURO TREATMENT   Patient Name: Ian Wong MRN: [id number] DOB:08/11/1961, 63 y.o., male Today's Date: 06/28/2024  REFERRING PROVIDER: Carilyn Prentice BRAVO, MD   END OF SESSION:  PT End of Session - 06/28/24 1306     Visit Number 5    Number of Visits 6    Date for PT Re-Evaluation 07/29/24    PT Start Time 1300    Activity Tolerance Patient tolerated treatment well    Behavior During Therapy [facility] for tasks assessed/performed          Past Medical History:  Diagnosis Date   GERD (gastroesophageal reflux disease)    Hypothyroidism    Seizures (HCC)    Past Surgical History:  Procedure Laterality Date   AORTA - BILATERAL FEMORAL ARTERY BYPASS GRAFT Bilateral 01/07/2017   Procedure: AORTOBIFEMORAL BYPASS GRAFT;  Surgeon: Krystal JULIANNA Doing, MD;  Location: [facility] OR;  Service: Vascular;  Laterality: Bilateral;   I & D EXTREMITY Left 11/28/2014   Procedure: IRRIGATION AND DEBRIDEMENT;  REPAIR OF CRUSH INJURY;  Surgeon: Franky Curia, MD;  Location: [facility] OR;  Service: Orthopedics;  Laterality: Left;   INCISIONAL HERNIA REPAIR N/A 10/26/2017   Procedure: HERNIA REPAIR INCISIONAL WITH MESH;  Surgeon: Mavis Anes, MD;  Location: [facility] ORS;  Service: General;  Laterality: N/A;   IR CT HEAD LTD  11/30/2023   IR INTRAVSC STENT CERV CAROTID W/EMB-PROT MOD SED INCL ANGIO  11/30/2023   IR PERCUTANEOUS ART THROMBECTOMY/INFUSION INTRACRANIAL INC DIAG ANGIO  11/30/2023   IR RADIOLOGIST EVAL & MGMT  03/28/2024   IR US  GUIDE VASC ACCESS RIGHT  11/30/2023   RADIOLOGY WITH ANESTHESIA N/A 11/30/2023   Procedure: IR WITH ANESTHESIA;  Surgeon: Radiologist, Medication, MD;  Location: [facility] OR;  Service: Radiology;  Laterality: N/A;   Patient Active Problem List   Diagnosis Date Noted   Dysphagia, post-stroke 12/16/2023   Spastic hemiparesis 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 (HCC) 12/16/2023   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   Acute  ischemic right MCA stroke (HCC) 11/30/2023   Acute right MCA stroke (HCC) 11/30/2023   Acute hypoxic respiratory failure (HCC) 11/30/2023   Acute metabolic encephalopathy 11/30/2023   Aspiration pneumonia (HCC) 11/30/2023   Incisional hernia, without obstruction or gangrene    Aortoiliac occlusive disease (HCC) 01/07/2017   Leriche syndrome (HCC)    PAD (peripheral artery disease) (HCC) 01/03/2017   Critical lower limb ischemia (HCC) 01/03/2017   Cellulitis 12/29/2016   Cellulitis of left lower extremity     ONSET DATE: 11/29/24  REFERRING DIAG: Right middle cerebral artery stroke   THERAPY DIAG:  Right middle cerebral artery stroke (HCC)  Chronic left shoulder pain  Stiffness of left shoulder, not elsewhere classified  History of falling  Muscle weakness (generalized)  Rationale for Evaluation and Treatment: Rehabilitation  SUBJECTIVE:  SUBJECTIVE STATEMENT: Pt reports 4-5/10 left shoulder pain today.  Pt accompanied by: self  PERTINENT HISTORY: History of a CVA, and history of seizures  PAIN:  Are you having pain? Yes: NPRS scale: 4-5/10 Pain location: left anterior shoulder Pain description: nagging, sharp pain  Aggravating factors: raising his arm, lifting  Relieving factors: injection (for a few day), topical cream  PRECAUTIONS: Fall  RED FLAGS: None   WEIGHT BEARING RESTRICTIONS: No  FALLS: Has patient fallen in last 6 months? Yes. Number of falls 1  LIVING ENVIRONMENT: Lives with: lives with their family Lives in: House/apartment Stairs: Yes: External: 6-7 steps; can reach both Has following equipment at home: shower chair, Grab bars, and Ramped entry  PLOF: Independent  PATIENT GOALS: be able to use his arm more   NEXT MD FOLLOW UP: 07/25/24  OBJECTIVE:   Note: Objective measures were completed at Evaluation unless otherwise noted.  DIAGNOSTIC FINDINGS: 05/17/24 left shoulder x-ray IMPRESSION: No acute displaced fracture or dislocation. COGNITION: Overall cognitive status: Within functional limits for tasks assessed   SENSATION: Light touch: Impaired in LUE (C6-7) and LLE (L3-S1) dermatomes  Patient reports numbness in his left hand since his stroke on 11/30/23.  MUSCLE TONE: [facility] for activities assessed  UPPER EXTREMITY ROM:  Active ROM Right eval Left eval  Shoulder flexion 127 62 limited-pain 101 (PROM)  Shoulder extension    Shoulder abduction 116 55 limited -pain 86 (PROM)   Shoulder adduction    Shoulder extension    Shoulder internal rotation    Shoulder external rotation    Elbow flexion    Elbow extension    Wrist flexion    Wrist extension    Wrist ulnar deviation    Wrist radial deviation    Wrist pronation    Wrist supination     (Blank rows = not tested)   LOWER EXTREMITY ROM: WFL for activities assessed  LOWER EXTREMITY MMT:    MMT Right Eval Left Eval  Hip flexion 4+/5 4/5  Hip extension    Hip abduction    Hip adduction    Hip internal rotation    Hip external rotation    Knee flexion 4+/5 4+/5  Knee extension 4+/5 4-/5  Ankle dorsiflexion 4+/5 4+/5  Ankle plantarflexion    Ankle inversion    Ankle eversion    (Blank rows = not tested)  JOINT MOBILITY:  Left glenohumeral:  Inferior: WFL and nonpainful  Posterior: hypomobile and nonpainful   TRANSFERS: Sit to stand: Complete Independence  Assistive device utilized: None     Stand to sit: Complete Independence  Assistive device utilized: None      GAIT: Findings: Gait Characteristics: step to pattern and ataxic, Assistive device utilized:None, and Level of assistance: SBA  FUNCTIONAL TESTS:  5 times sit to stand: 16.69 seconds without UE support from armrests Timed up and go (TUG): 15 seconds                                                                                                        TREATMENT DATE:  06/28/24                                  EXERCISE LOG  Exercise Repetitions and Resistance Comments  Nustep Lvl 4 x 15 mins   AA Flexion Cane 1# 1 sets of 10 reps   AA Chest Press    2-way Bicep Curls    Sword Fighting    LAQs 5# x 30 reps bil   Seated Marches 5# x 30 reps bil   Seated Ham Curls Blue x 20 reps bil   STS     Goal Assessment     Blank cell = exercise not performed today   Manual Therapy Soft Tissue Mobilization: Left shoulder, STW/M to left middle deltoid to decrease pain and tone   Modalities  Date:  Unattended Estim: Shoulder, IFC 80-150 Hz, 15 mins, Pain and Tone  PATIENT EDUCATION: Education details: Plan of care, objective findings, and goals for physical therapy Person educated: Patient Education method: Explanation Education comprehension: verbalized understanding  HOME EXERCISE PROGRAM:   GOALS: Goals reviewed with patient? No  LONG TERM GOALS: Target date: 07/12/24  Patient will be independent with his HEP. Baseline:  Goal status: IN PROGRESS  2.  Patient will be able to complete his daily activities without his familiar symptoms exceeding 6/10. Baseline:  Goal status: IN PROGRESS  3.  Patient will be able to demonstrate at least 90 degrees of active left shoulder flexion for improved function reaching. Baseline: 7/23: 84 degrees Goal status: IN PROGRESS  4.  Patient will be able to carry at least 5 pounds in his left upper extremity for improved function carrying groceries. Baseline:  Goal status: MET  5.  Patient will improve his 5 times sit to stand time to 12 seconds or less to reduce his fall risk. Baseline: 7/23: 14.23 seconds Goal status: IN PROGRESS  6.  Patient will improve his timed up and go time to 12 seconds or less to reduce his fall risk. Baseline: 7/23: 11.78 seconds Goal status: MET  ASSESSMENT:  CLINICAL IMPRESSION: Pt arrives  for today's treatment session reporting 4-5/10 left shoulder pain.  Pt with increased difficulty with arm exercises today due to pain and soreness.  Due to this pain, today's treatment session concentrating on BLE strengthening.  STW/M performed to left deltoid to decrease pain and tone.  Normal responses to estim noted upon removal.  Pt reported 3/10 left shoulder pain at completion of today's treatment session.  OBJECTIVE IMPAIRMENTS: Abnormal gait, decreased balance, decreased mobility, difficulty walking, decreased ROM, decreased strength, hypomobility, impaired flexibility, impaired sensation, impaired tone, impaired UE functional use, and pain.   ACTIVITY LIMITATIONS: carrying, lifting, stairs, transfers, reach over head, and locomotion level  PARTICIPATION LIMITATIONS: meal prep, cleaning, community activity, and yard work  PERSONAL FACTORS: Past/current experiences, Time since onset of injury/illness/exacerbation, and 3+ comorbidities: History of a CVA, and history of seizures are also affecting patient's functional outcome.   REHAB POTENTIAL: Fair    CLINICAL DECISION MAKING: Evolving/moderate complexity  EVALUATION COMPLEXITY: Moderate  PLAN:  PT FREQUENCY: 1-2x/week  PT DURATION: 4 weeks  PLANNED INTERVENTIONS: 97164- PT Re-evaluation, 97750- Physical Performance Testing, 97110-Therapeutic exercises, 97530- Therapeutic activity, [id number]- Neuromuscular re-education, 97535- Self Care, 02859- Manual therapy, [id number]- Gait training, [phone]- Electrical stimulation (unattended), 97016- Vasopneumatic device, Patient/Family education, Balance training, Stair training, Joint mobilization, Cryotherapy, and Moist heat  PLAN FOR NEXT SESSION: NuStep, isometrics, active assisted range of  motion, manual therapy, and modalities as needed   AYREN ZUMBRO, PTA 06/28/2024, 2:46 PM

## 2024-06-30 ENCOUNTER — Encounter

## 2024-07-05 ENCOUNTER — Ambulatory Visit: Attending: Physical Medicine & Rehabilitation

## 2024-07-05 DIAGNOSIS — M25512 Pain in left shoulder: Secondary | ICD-10-CM | POA: Diagnosis present

## 2024-07-05 DIAGNOSIS — M25612 Stiffness of left shoulder, not elsewhere classified: Secondary | ICD-10-CM | POA: Insufficient documentation

## 2024-07-05 DIAGNOSIS — I63511 Cerebral infarction due to unspecified occlusion or stenosis of right middle cerebral artery: Secondary | ICD-10-CM | POA: Diagnosis present

## 2024-07-05 DIAGNOSIS — Z9181 History of falling: Secondary | ICD-10-CM | POA: Insufficient documentation

## 2024-07-05 DIAGNOSIS — G8929 Other chronic pain: Secondary | ICD-10-CM | POA: Diagnosis present

## 2024-07-05 NOTE — Therapy (Signed)
 OUTPATIENT PHYSICAL THERAPY NEURO TREATMENT   Patient Name: Ian Wong MRN: [id number] DOB:1961/05/01, 63 y.o., male Today's Date: 07/05/2024  REFERRING PROVIDER: Carilyn Prentice BRAVO, MD   END OF SESSION:  PT End of Session - 07/05/24 1320     Visit Number 6    Number of Visits 6    Date for PT Re-Evaluation 07/29/24    PT Start Time 1304    PT Stop Time 1342    PT Time Calculation (min) 38 min    Activity Tolerance Patient tolerated treatment well    Behavior During Therapy [facility] for tasks assessed/performed          Past Medical History:  Diagnosis Date   GERD (gastroesophageal reflux disease)    Hypothyroidism    Seizures (HCC)    Past Surgical History:  Procedure Laterality Date   AORTA - BILATERAL FEMORAL ARTERY BYPASS GRAFT Bilateral 01/07/2017   Procedure: AORTOBIFEMORAL BYPASS GRAFT;  Surgeon: Krystal JULIANNA Doing, MD;  Location: [facility] OR;  Service: Vascular;  Laterality: Bilateral;   I & D EXTREMITY Left 11/28/2014   Procedure: IRRIGATION AND DEBRIDEMENT;  REPAIR OF CRUSH INJURY;  Surgeon: Franky Curia, MD;  Location: [facility] OR;  Service: Orthopedics;  Laterality: Left;   INCISIONAL HERNIA REPAIR N/A 10/26/2017   Procedure: HERNIA REPAIR INCISIONAL WITH MESH;  Surgeon: Mavis Anes, MD;  Location: [facility] ORS;  Service: General;  Laterality: N/A;   IR CT HEAD LTD  11/30/2023   IR INTRAVSC STENT CERV CAROTID W/EMB-PROT MOD SED INCL ANGIO  11/30/2023   IR PERCUTANEOUS ART THROMBECTOMY/INFUSION INTRACRANIAL INC DIAG ANGIO  11/30/2023   IR RADIOLOGIST EVAL & MGMT  03/28/2024   IR US  GUIDE VASC ACCESS RIGHT  11/30/2023   RADIOLOGY WITH ANESTHESIA N/A 11/30/2023   Procedure: IR WITH ANESTHESIA;  Surgeon: Radiologist, Medication, MD;  Location: [facility] OR;  Service: Radiology;  Laterality: N/A;   Patient Active Problem List   Diagnosis Date Noted   Dysphagia, post-stroke 12/16/2023   Spastic hemiparesis 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 (HCC) 12/16/2023   Right  middle cerebral artery stroke (HCC) 12/11/2023   Acute ischemic right MCA stroke (HCC) 11/30/2023   Acute right MCA stroke (HCC) 11/30/2023   Acute hypoxic respiratory failure (HCC) 11/30/2023   Acute metabolic encephalopathy 11/30/2023   Aspiration pneumonia (HCC) 11/30/2023   Incisional hernia, without obstruction or gangrene    Aortoiliac occlusive disease (HCC) 01/07/2017   Leriche syndrome (HCC)    PAD (peripheral artery disease) (HCC) 01/03/2017   Critical lower limb ischemia (HCC) 01/03/2017   Cellulitis 12/29/2016   Cellulitis of left lower extremity     ONSET DATE: 11/29/24  REFERRING DIAG: Right middle cerebral artery stroke   THERAPY DIAG:  Right middle cerebral artery stroke (HCC)  Chronic left shoulder pain  Stiffness of left shoulder, not elsewhere classified  History of falling  Rationale for Evaluation and Treatment: Rehabilitation  SUBJECTIVE:  SUBJECTIVE STATEMENT: Patient reports that he still feels about the same. He cannot tell any improvement. He still can't pick things up with his left hand. He goes back to see his referring physician on 07/12/24.  Pt accompanied by: self  PERTINENT HISTORY: History of a CVA, and history of seizures  PAIN:  Are you having pain? Yes: NPRS scale: 4-5/10 Pain location: left anterior shoulder Pain description: nagging, sharp pain  Aggravating factors: raising his arm, lifting  Relieving factors: injection (for a few day), topical cream  PRECAUTIONS: Fall  RED FLAGS: None   WEIGHT BEARING RESTRICTIONS: No  FALLS: Has patient fallen in last 6 months? Yes. Number of falls 1  LIVING ENVIRONMENT: Lives with: lives with their family Lives in: House/apartment Stairs: Yes: External: 6-7 steps; can reach both Has following equipment  at home: shower chair, Grab bars, and Ramped entry  PLOF: Independent  PATIENT GOALS: be able to use his arm more   NEXT MD FOLLOW UP: 07/25/24  OBJECTIVE:  Note: Objective measures were completed at Evaluation unless otherwise noted.  DIAGNOSTIC FINDINGS: 05/17/24 left shoulder x-ray IMPRESSION: No acute displaced fracture or dislocation. COGNITION: Overall cognitive status: Within functional limits for tasks assessed   SENSATION: Light touch: Impaired in LUE (C6-7) and LLE (L3-S1) dermatomes  Patient reports numbness in his left hand since his stroke on 11/30/23.  MUSCLE TONE: [facility] for activities assessed  UPPER EXTREMITY ROM:  Active ROM Right eval Left eval  Shoulder flexion 127 62 limited-pain 101 (PROM)  Shoulder extension    Shoulder abduction 116 55 limited -pain 86 (PROM)   Shoulder adduction    Shoulder extension    Shoulder internal rotation    Shoulder external rotation    Elbow flexion    Elbow extension    Wrist flexion    Wrist extension    Wrist ulnar deviation    Wrist radial deviation    Wrist pronation    Wrist supination     (Blank rows = not tested)   LOWER EXTREMITY ROM: WFL for activities assessed  LOWER EXTREMITY MMT:    MMT Right Eval Left Eval  Hip flexion 4+/5 4/5  Hip extension    Hip abduction    Hip adduction    Hip internal rotation    Hip external rotation    Knee flexion 4+/5 4+/5  Knee extension 4+/5 4-/5  Ankle dorsiflexion 4+/5 4+/5  Ankle plantarflexion    Ankle inversion    Ankle eversion    (Blank rows = not tested)  JOINT MOBILITY:  Left glenohumeral:  Inferior: WFL and nonpainful  Posterior: hypomobile and nonpainful   TRANSFERS: Sit to stand: Complete Independence  Assistive device utilized: None     Stand to sit: Complete Independence  Assistive device utilized: None      GAIT: Findings: Gait Characteristics: step to pattern and ataxic, Assistive device utilized:None, and Level of assistance:  SBA  FUNCTIONAL TESTS:  5 times sit to stand: 16.69 seconds without UE support from armrests Timed up and go (TUG): 15 seconds  TREATMENT DATE:                                      EXERCISE LOG  Exercise Repetitions and Resistance Comments  Nustep  L4 x 16.5 minutes   Goal assessment See below    UE ranger (seated) 3.5 minutes Multidirectional  Scapular retraction  25 reps         Blank cell = exercise not performed today   06/28/24                                  EXERCISE LOG  Exercise Repetitions and Resistance Comments  Nustep Lvl 4 x 15 mins   AA Flexion Cane 1# 1 sets of 10 reps   AA Chest Press    2-way Bicep Curls    Sword Fighting    LAQs 5# x 30 reps bil   Seated Marches 5# x 30 reps bil   Seated Ham Curls Blue x 20 reps bil   STS     Goal Assessment     Blank cell = exercise not performed today   Manual Therapy Soft Tissue Mobilization: Left shoulder, STW/M to left middle deltoid to decrease pain and tone   Modalities  Date:  Unattended Estim: Shoulder, IFC 80-150 Hz, 15 mins, Pain and Tone  PATIENT EDUCATION: Education details: Plan of care, objective findings, and goals for physical therapy Person educated: Patient Education method: Explanation Education comprehension: verbalized understanding  HOME EXERCISE PROGRAM:   GOALS: Goals reviewed with patient? No  LONG TERM GOALS: Target date: 07/12/24  Patient will be independent with his HEP. Baseline: about every other day Goal status: PARTIALLY MET   2.  Patient will be able to complete his daily activities without his familiar symptoms exceeding 6/10. Baseline: up to 8/10 Goal status: NOT MET  3.  Patient will be able to demonstrate at least 90 degrees of active left shoulder flexion for improved function reaching. Baseline: 7/23: 84 degrees  07/05/24: 83 degrees Goal status: NOT MET  4.   Patient will be able to carry at least 5 pounds in his left upper extremity for improved function carrying groceries. Baseline:  Goal status: MET  5.  Patient will improve his 5 times sit to stand time to 12 seconds or less to reduce his fall risk. Baseline: 7/23: 14.23 seconds 07/05/24: 16.79 seconds Goal status: NOT MET  6.  Patient will improve his timed up and go time to 12 seconds or less to reduce his fall risk. Baseline: 7/23: 11.78 seconds Goal status: MET  ASSESSMENT:  CLINICAL IMPRESSION: Patient has made minimal progress with skilled physical therapy as evidenced by his subjective reports, objective measures, functional mobility, ,and progress toward his goals. He was able to meet his timed up and go goal. However, he was unable to demonstrate a significant improvement in his left shoulder mobility and five time sit to stand time. His HEP was reviewed and he felt comfortable with these interventions. He felt comfortable being discharged at this time with his HEP due to maximizing his rehabilitation potential.   PHYSICAL THERAPY DISCHARGE SUMMARY  Visits from Start of Care: 6  Current functional level related to goals / functional outcomes: Patient was able to meet his carrying and timed up and go goals. However, he was unable to meet his other goals  for physical therapy.    Remaining deficits: Pain, AROM, and muscular strength   Education / Equipment: HEP    Patient agrees to discharge. Patient goals were partially met. Patient is being discharged due to maximized rehab potential.    OBJECTIVE IMPAIRMENTS: Abnormal gait, decreased balance, decreased mobility, difficulty walking, decreased ROM, decreased strength, hypomobility, impaired flexibility, impaired sensation, impaired tone, impaired UE functional use, and pain.   ACTIVITY LIMITATIONS: carrying, lifting, stairs, transfers, reach over head, and locomotion level  PARTICIPATION LIMITATIONS: meal prep, cleaning,  community activity, and yard work  PERSONAL FACTORS: Past/current experiences, Time since onset of injury/illness/exacerbation, and 3+ comorbidities: History of a CVA, and history of seizures are also affecting patient's functional outcome.   REHAB POTENTIAL: Fair    CLINICAL DECISION MAKING: Evolving/moderate complexity  EVALUATION COMPLEXITY: Moderate  PLAN:  PT FREQUENCY: 1-2x/week  PT DURATION: 4 weeks  PLANNED INTERVENTIONS: 97164- PT Re-evaluation, 97750- Physical Performance Testing, 97110-Therapeutic exercises, 97530- Therapeutic activity, [id number]- Neuromuscular re-education, 97535- Self Care, 02859- Manual therapy, [phone]- Gait training, [phone]- Electrical stimulation (unattended), 97016- Vasopneumatic device, Patient/Family education, Balance training, Stair training, Joint mobilization, Cryotherapy, and Moist heat  PLAN FOR NEXT SESSION: NuStep, isometrics, active assisted range of motion, manual therapy, and modalities as needed   Lacinda JAYSON Fass, PT 07/05/2024, 2:08 PM

## 2024-07-08 ENCOUNTER — Other Ambulatory Visit: Payer: Self-pay | Admitting: Physical Medicine & Rehabilitation

## 2024-07-12 ENCOUNTER — Encounter: Payer: Self-pay | Admitting: Physical Medicine & Rehabilitation

## 2024-07-12 ENCOUNTER — Encounter: Attending: Physical Medicine & Rehabilitation | Admitting: Physical Medicine & Rehabilitation

## 2024-07-12 VITALS — BP 138/84 | HR 71 | Ht 71.0 in | Wt 200.0 lb

## 2024-07-12 DIAGNOSIS — I63511 Cerebral infarction due to unspecified occlusion or stenosis of right middle cerebral artery: Secondary | ICD-10-CM | POA: Insufficient documentation

## 2024-07-12 DIAGNOSIS — M7502 Adhesive capsulitis of left shoulder: Secondary | ICD-10-CM | POA: Insufficient documentation

## 2024-07-12 NOTE — Progress Notes (Signed)
 Subjective:    Patient ID: Ian Wong, male    DOB: 05/17/1961, 63 y.o.   MRN: [id number] 63 y.o. male with history of GERD, hypothyroidism, seizures who presented to [facility] ED with persistent right gaze deviation and left hemiplegia.  Last known well was 12:45 PM.  Family called EMS when his symptoms started.  They noted that he was weak on the left and his speech was slurring.  CT head without contrast with aspects of 10 and no large territory infarct or intracranial hemorrhage.  CTA demonstrated occlusion of the right ICA with distal intracranial reconstitution and then occlusion of the right MCA.  He was given TNKase  and was transferred to [facility] for emergent thrombectomy.   HPI  Patient continues to have left upper extremity weakness also shoulder stiffness and pain.  He had some short-term relief with left shoulder injection He has completed both inpatient rehabilitation as well as outpatient rehabilitation  We discussed that from a stroke recovery standpoint he is at a plateau status.  He is 8 months post stroke.  We discussed that he should keep up with his home exercise program to minimize any loss of gains that he is made over the last several months.  Pain Inventory Average Pain 8 Pain Right Now 6 My pain is aching  In the last 24 hours, has pain interfered with the following? General activity 7 Relation with others 4 Enjoyment of life 7 What TIME of day is your pain at its worst? night Sleep (in general) Fair  Pain is worse with: some activites Pain improves with: rest and medication Relief from Meds: 6  Family History  Problem Relation Age of Onset   COPD Mother    Other Brother    Social History   Socioeconomic History   Marital status: Married    Spouse name: Not on file   Number of children: Not on file   Years of education: Not on file   Highest education level: Not on file  Occupational History   Not on file  Tobacco Use    Smoking status: Former    Current packs/day: 0.00    Types: Cigarettes    Quit date: 12/2016    Years since quitting: 7.6   Smokeless tobacco: Never  Vaping Use   Vaping status: Never Used  Substance and Sexual Activity   Alcohol use: Not Currently    Comment: last drank etoh 1 1/2 months ago.   Drug use: No   Sexual activity: Not on file  Other Topics Concern   Not on file  Social History Narrative   Not on file   Social Drivers of Health   Financial Resource Strain: Not on file  Food Insecurity: Not on file  Transportation Needs: Not on file  Physical Activity: Not on file  Stress: Not on file  Social Connections: Not on file   Past Surgical History:  Procedure Laterality Date   AORTA - BILATERAL FEMORAL ARTERY BYPASS GRAFT Bilateral 01/07/2017   Procedure: AORTOBIFEMORAL BYPASS GRAFT;  Surgeon: Krystal JULIANNA Doing, MD;  Location: [facility] OR;  Service: Vascular;  Laterality: Bilateral;   I & D EXTREMITY Left 11/28/2014   Procedure: IRRIGATION AND DEBRIDEMENT;  REPAIR OF CRUSH INJURY;  Surgeon: Franky Curia, MD;  Location: [facility] OR;  Service: Orthopedics;  Laterality: Left;   INCISIONAL HERNIA REPAIR N/A 10/26/2017   Procedure: HERNIA REPAIR INCISIONAL WITH MESH;  Surgeon: Mavis Anes, MD;  Location: [facility] ORS;  Service:  General;  Laterality: N/A;   IR CT HEAD LTD  11/30/2023   IR INTRAVSC STENT CERV CAROTID W/EMB-PROT MOD SED INCL ANGIO  11/30/2023   IR PERCUTANEOUS ART THROMBECTOMY/INFUSION INTRACRANIAL INC DIAG ANGIO  11/30/2023   IR RADIOLOGIST EVAL & MGMT  03/28/2024   IR US  GUIDE VASC ACCESS RIGHT  11/30/2023   RADIOLOGY WITH ANESTHESIA N/A 11/30/2023   Procedure: IR WITH ANESTHESIA;  Surgeon: Radiologist, Medication, MD;  Location: [facility] OR;  Service: Radiology;  Laterality: N/A;   Past Surgical History:  Procedure Laterality Date   AORTA - BILATERAL FEMORAL ARTERY BYPASS GRAFT Bilateral 01/07/2017   Procedure: AORTOBIFEMORAL BYPASS GRAFT;  Surgeon: Krystal JULIANNA Doing, MD;  Location: [facility]  OR;  Service: Vascular;  Laterality: Bilateral;   I & D EXTREMITY Left 11/28/2014   Procedure: IRRIGATION AND DEBRIDEMENT;  REPAIR OF CRUSH INJURY;  Surgeon: Franky Curia, MD;  Location: [facility] OR;  Service: Orthopedics;  Laterality: Left;   INCISIONAL HERNIA REPAIR N/A 10/26/2017   Procedure: HERNIA REPAIR INCISIONAL WITH MESH;  Surgeon: Mavis Anes, MD;  Location: [facility] OR[facility];  Service: General;  Laterality: N/A;   IR CT HEAD LTD  11/30/2023   IR INTRAVSC STENT CERV CAROTID W/EMB-PROT MOD SED INCL ANGIO  11/30/2023   IR PERCUTANEOUS ART THROMBECTOMY/INFUSION INTRACRANIAL INC DIAG ANGIO  11/30/2023   IR RADIOLOGIST EVAL & MGMT  03/28/2024   IR US  GUIDE VASC ACCESS RIGHT  11/30/2023   RADIOLOGY WITH ANESTHESIA N/A 11/30/2023   Procedure: IR WITH ANESTHESIA;  Surgeon: Radiologist, Medication, MD;  Location: [facility] OR;  Service: Radiology;  Laterality: N/A;   Past Medical History:  Diagnosis Date   GERD (gastroesophageal reflux disease)    Hypothyroidism    Seizures (HCC)    BP 138/84   Pulse 71   Ht 5' 11 (1.803 m)   Wt 200 lb (90.7 kg)   SpO2 94%   BMI 27.89 kg/m   Opioid Risk Score:   Fall Risk Score:  `1  Depression screen PHQ 2/9     05/17/2024    2:29 PM 01/13/2024    1:32 PM  Depression screen PHQ 2/9  Decreased Interest 1 0  Down, Depressed, Hopeless 1 0  PHQ - 2 Score 2 0  Altered sleeping  0  Tired, decreased energy  0  Change in appetite  0  Feeling bad or failure about yourself   0  Trouble concentrating  0  Moving slowly or fidgety/restless  0  Suicidal thoughts  0  PHQ-9 Score  0  Difficult doing work/chores  Not difficult at all      Review of Systems  Musculoskeletal:        Left shoulder pain  All other systems reviewed and are negative.      Objective:   Physical Exam  Motor strength is 3 - at the left deltoid elbow flexors elbow extensors finger flexors and to minus finger extension and wrist extension on the left side. Sensation is absent to light  touch in the left upper extremity There is mild left lower facial droop. Speech is mildly dysarthric Left lower extremity strength is 4+ at the hip flexor knee extensor ankle dorsiflexor and plantar flexor Ambulates without assistive device he has a reduced stance phase on the left side but otherwise no evidence of toe drag or knee instability. Musculoskeletal There is no evidence of significant subluxation left shoulder. There is reduced range of motion with external rotation abduction forward flexion. There is pain with all these  motions.      Assessment & Plan:  1.  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  Upper limb greater than lower limb involvement.  Still has some sensory loss in the left upper extremity. Overall I do not expect his level of function to improve to any significant degree.  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 2.  Left shoulder adhesive capsulitis we discussed treatment options including range of motion under general anesthesia.  We discussed that it would likely cause some soreness for a week or 2 after the procedure and he would likely need to be on some pain medication during that time.  We also discussed that his so difficult to know how much function he would gain from that range of motion increased.  We also discussed that the procedure would not increase the strength of his arm.  He would like to think about this and talk it over with his wife. Would like to see him back in 3 months

## 2024-07-12 NOTE — Patient Instructions (Addendum)
 Graduated return to driving instructions were provided. It is recommended that the patient first drives with another licensed driver in an empty parking lot. If the patient does well with this, and they can drive on a quiet street with the licensed driver. If the patient does well with this they can drive on a busy street with a licensed driver. If the patient does well with this, the next time out they can go by himself. For the first month after resuming driving, I recommend no nighttime or Interstate driving.   We discussed [place] Range of Motion under general anesthesia to help with left frozen shoulder.  Please let me know if you would like a referral to Dr Jerri Jaeger for this

## 2024-08-05 ENCOUNTER — Other Ambulatory Visit: Payer: Self-pay | Admitting: Physical Medicine & Rehabilitation

## 2024-09-22 ENCOUNTER — Emergency Department (HOSPITAL_COMMUNITY)
Admission: EM | Admit: 2024-09-22 | Discharge: 2024-09-22 | Disposition: A | Discharge status: Home / Self Care | Attending: Emergency Medicine | Admitting: Emergency Medicine

## 2024-09-22 ENCOUNTER — Encounter (HOSPITAL_COMMUNITY): Payer: Self-pay

## 2024-09-22 ENCOUNTER — Emergency Department (HOSPITAL_COMMUNITY)

## 2024-09-22 ENCOUNTER — Other Ambulatory Visit: Payer: Self-pay

## 2024-09-22 DIAGNOSIS — J449 Chronic obstructive pulmonary disease, unspecified: Secondary | ICD-10-CM | POA: Diagnosis not present

## 2024-09-22 DIAGNOSIS — R55 Syncope and collapse: Secondary | ICD-10-CM | POA: Diagnosis present

## 2024-09-22 DIAGNOSIS — U071 COVID-19: Secondary | ICD-10-CM | POA: Diagnosis not present

## 2024-09-22 DIAGNOSIS — Z7982 Long term (current) use of aspirin: Secondary | ICD-10-CM | POA: Diagnosis not present

## 2024-09-22 DIAGNOSIS — Z87891 Personal history of nicotine dependence: Secondary | ICD-10-CM | POA: Diagnosis not present

## 2024-09-22 LAB — CBC WITH DIFFERENTIAL/PLATELET
Abs Immature Granulocytes: 0.05 K/uL (ref 0.00–0.07)
Basophils Absolute: 0.1 K/uL (ref 0.0–0.1)
Basophils Relative: 1 %
Eosinophils Absolute: 0.1 K/uL (ref 0.0–0.5)
Eosinophils Relative: 1 %
HCT: 46.4 % (ref 39.0–52.0)
Hemoglobin: 15.8 g/dL (ref 13.0–17.0)
Immature Granulocytes: 1 %
Lymphocytes Relative: 13 %
Lymphs Abs: 0.9 K/uL (ref 0.7–4.0)
MCH: 31.2 pg (ref 26.0–34.0)
MCHC: 34.1 g/dL (ref 30.0–36.0)
MCV: 91.7 fL (ref 80.0–100.0)
Monocytes Absolute: 1.4 K/uL — ABNORMAL HIGH (ref 0.1–1.0)
Monocytes Relative: 20 %
Neutro Abs: 4.5 K/uL (ref 1.7–7.7)
Neutrophils Relative %: 64 %
Platelets: 198 K/uL (ref 150–400)
RBC: 5.06 MIL/uL (ref 4.22–5.81)
RDW: 13 % (ref 11.5–15.5)
WBC: 7.1 K/uL (ref 4.0–10.5)
nRBC: 0 % (ref 0.0–0.2)

## 2024-09-22 LAB — RESP PANEL BY RT-PCR (RSV, FLU A&B, COVID)  RVPGX2
Influenza A by PCR: NEGATIVE
Influenza B by PCR: NEGATIVE
Resp Syncytial Virus by PCR: NEGATIVE
SARS Coronavirus 2 by RT PCR: POSITIVE — AB

## 2024-09-22 LAB — COMPREHENSIVE METABOLIC PANEL WITH GFR
ALT: 16 U/L (ref 0–44)
AST: 28 U/L (ref 15–41)
Albumin: 4.2 g/dL (ref 3.5–5.0)
Alkaline Phosphatase: 82 U/L (ref 38–126)
Anion gap: 11 (ref 5–15)
BUN: 10 mg/dL (ref 8–23)
CO2: 28 mmol/L (ref 22–32)
Calcium: 9 mg/dL (ref 8.9–10.3)
Chloride: 101 mmol/L (ref 98–111)
Creatinine, Ser: 1.02 mg/dL (ref 0.61–1.24)
GFR, Estimated: >60 mL/min (ref >60)
Glucose, Bld: 106 mg/dL — ABNORMAL HIGH (ref 70–99)
Potassium: 4 mmol/L (ref 3.5–5.1)
Sodium: 140 mmol/L (ref 135–145)
Total Bilirubin: 0.3 mg/dL (ref 0.0–1.2)
Total Protein: 7.4 g/dL (ref 6.5–8.1)

## 2024-09-22 LAB — CBG MONITORING, ED: Glucose-Capillary: 91 mg/dL (ref 70–99)

## 2024-09-22 MED ORDER — ALBUTEROL SULFATE (2.5 MG/3ML) 0.083% IN NEBU
2.5000 mg | INHALATION_SOLUTION | Freq: Once | RESPIRATORY_TRACT | Status: AC
  Administered 2024-09-22: 2.5 mg via RESPIRATORY_TRACT
  Filled 2024-09-22: qty 3

## 2024-09-22 MED ORDER — ALBUTEROL SULFATE (2.5 MG/3ML) 0.083% IN NEBU: 2.5000 mg | INHALATION_SOLUTION | Freq: Four times a day (QID) | RESPIRATORY_TRACT | 12 refills | Status: AC | PRN

## 2024-09-22 MED ORDER — ACETAMINOPHEN 500 MG PO TABS
1000.0000 mg | ORAL_TABLET | Freq: Once | ORAL | Status: AC
  Administered 2024-09-22: 1000 mg via ORAL
  Filled 2024-09-22: qty 2

## 2024-09-22 MED ORDER — PAXLOVID (300/100) 20 X 150 MG & 10 X 100MG PO TBPK: 3.0000 | ORAL_TABLET | Freq: Two times a day (BID) | ORAL | 0 refills | Status: AC

## 2024-09-22 NOTE — ED Notes (Addendum)
 [facility] called and requested dr to call and let them know if they should still give paxlovid because it inter reacts with their daily meds. Message to to Dr. 09/22/24 @1600   Dr will call pharmacy.

## 2024-09-22 NOTE — ED Provider Notes (Signed)
 Shared visit with Megan, PA-C.  Patient poor historian. Patient stating that he was walking around his house and felt lightheaded. Patient sat down and took his daily dose of albuterol  inhaler for his COPD. Patient stating that he held his breath for a long time to make sure that the medication had time to absorb in his lungs. Patient does not think that he loss consciousness at this time, but states that the home health nurse that is there for the patient's son noticed the patient slumped over a little bit in the chair. RN told patient that he might have been out for 30-45 seconds. Patient stating that he remembers the RN talking to him. Patient denies any prodromal symptoms such as chest pain, headache, vision changes, unilateral neuro deficits. Endorses chronic left sided deficits from prior stroke nearly 1 year ago. Patient does endorse that him and his wife are currently sick with URI - endorsing rhinorrhea and cough but denies dyspnea.   Overall, I believe patient's lightheadedness is contributed to recent viral URI and holding his breath for long time after albuterol  inhaler. Will obtain basic lab workup. Will also obtain CT head and check patient's orthostatics.    Neuro exam: GCS 15. Speech is goal oriented. No deficits appreciated to CN III-XII; symmetric eyebrow raise, no facial drooping, tongue midline. Patient has equal grip strength bilaterally with 5/5 strength against resistance in all major muscle groups bilaterally. Sensation to light touch intact. Patient moves extremities without ataxia.    Hoy Nidia FALCON, [place] 09/22/24 1043    Patsey Lot, MD 09/23/24 [phone]

## 2024-09-22 NOTE — ED Triage Notes (Signed)
 Pt BIB [facility] with complaints of a witnessed syncopal episode. Pt states he was sitting in his chair breathing in his inhaler. Pt states he was holding his breath for 10 seconds and passed out in his chair. Cbg was 115 on ems truck. Pt is currently alert and oriented.

## 2024-09-22 NOTE — Discharge Instructions (Addendum)
 Thank you for visiting the Emergency Department today. It was a pleasure to be part of your healthcare team. Your test results showed you are positive for COVID.  I have prescribed you Paxlovid  -please remember to hold your Lipitor until Paxlovid course is complete.  I have prescribed you albuterol  treatments as needed for shortness of breath.  If you have any questions about your medicines, please call your pharmacy or healthcare provider. At home, rest, hydrate, and resume normal diet.  It is important to watch for warning signs such as chest pain, fever, trouble breathing. If any of these happen, return to the Emergency Department or call 911. Please follow up with your primary care provider within 1 week thank you for trusting us  with your health.

## 2024-09-22 NOTE — ED Notes (Signed)
 Pt ambulated in the hallway independently. O2 saturation stayed around 92-94% on RA.

## 2024-09-22 NOTE — ED Provider Notes (Signed)
 [place] EMERGENCY DEPARTMENT AT [facility] Provider Note   CSN: [id number] Arrival date & time: 09/22/24  9059     Patient presents with: Loss of Consciousness   Ian Wong is a 64 y.o. male with a history of stroke with left-sided deficits, presents to the ED, BIBEMS, after a witnessed syncopal episode at home this morning.  Patient stated he was using his albuterol  inhaler while sitting at his kitchen table, and states he was holding his breath for 30 seconds after inhale when he got dizzy and had a syncopal episode at the table patient denies falling out of the chair, or hitting his head.  Patient denies any visual disturbances preceding syncopal event, dizziness, headache, chest pain, shortness of breath. Patient denies any current dizziness, lightheadedness, weakness, chest pain, shortness of breath, nausea, vomiting, diarrhea, headache, neurological changes, swelling in lower extremities.  Patient states that the only thing that is causing him pain at this time is his left shoulder in which he has chronic pain.  Patient states that he has been battling a cold recently for which he has not been seen for, denies fevers.  No recent travel. No sick contacts. The patient's social history is notable for being a former smoker.    Loss of Consciousness      Prior to Admission medications   Medication Sig Start Date End Date Taking? Authorizing Provider  acetaminophen  (TYLENOL ) 325 MG tablet Take 2 tablets (650 mg total) by mouth every 4 (four) hours as needed for mild pain (pain score 1-3) (or temp > 37.5 C (99.5 F)). 12/29/23  Yes Angiulli, Toribio PARAS, PA-C  albuterol  (PROVENTIL ) (2.5 MG/3ML) 0.083% nebulizer solution Take 3 mLs (2.5 mg total) by nebulization every 6 (six) hours as needed for wheezing or shortness of breath. 09/22/24  Yes Bunny Kleist L, PA  aspirin  81 MG chewable tablet Chew 1 tablet (81 mg total) by mouth daily. 12/12/23  Yes Remi Pippin, NP   aspirin -sod bicarb-citric acid (ALKA-SELTZER) 325 MG TBEF tablet Take 325 mg by mouth every 6 (six) hours as needed (cold).   Yes [provider]  atorvastatin  (LIPITOR) 20 MG tablet Take 1 tablet (20 mg total) by mouth every evening. 12/30/23  Yes Angiulli, Toribio PARAS, PA-C  calcium  carbonate (TUMS - DOSED IN MG ELEMENTAL CALCIUM ) 500 MG chewable tablet Chew 1 tablet (200 mg of elemental calcium  total) by mouth 2 (two) times daily as needed for indigestion or heartburn. 12/11/23  Yes Shafer, Pippin, NP  escitalopram  (LEXAPRO ) 5 MG tablet Take 1 tablet (5 mg total) by mouth at bedtime. 12/30/23  Yes Angiulli, Toribio PARAS, PA-C  esomeprazole  (NEXIUM ) 20 MG capsule Take 1 capsule (20 mg total) by mouth daily before breakfast. 12/31/23  Yes Angiulli, Toribio PARAS, PA-C  gabapentin  (NEURONTIN ) 300 MG capsule Take 1 capsule by mouth twice daily 08/05/24  Yes Kirsteins, Prentice BRAVO, MD  levothyroxine  (SYNTHROID ) 50 MCG tablet Take 1 tablet (50 mcg total) by mouth daily. 12/30/23  Yes Angiulli, Toribio PARAS, PA-C  nirmatrelvir/ritonavir (PAXLOVID, 300/100,) 20 x 150 MG & 10 x 100MG  TBPK Take 3 tablets by mouth 2 (two) times daily for 5 days. Patient GFR is >60. Take nirmatrelvir (150 mg) two tablets twice daily for 5 days and ritonavir (100 mg) one tablet twice daily for 5 days. 09/22/24 09/27/24 Yes Muneer Leider L, PA  nystatin cream (MYCOSTATIN) Apply 1 Application topically 2 (two) times daily.   Yes [provider]  Omega-3 Fatty Acids (OMEGA 3  PO) Take 1 g by mouth 2 (two) times daily.   Yes [provider]  ondansetron  (ZOFRAN -ODT) 4 MG disintegrating tablet Take 1 tablet (4 mg total) by mouth every 8 (eight) hours as needed for nausea or vomiting. 01/06/24  Yes Yolande Lamar BROCKS, MD  ticagrelor  (BRILINTA ) 90 MG TABS tablet Take 1 tablet (90 mg total) by mouth 2 (two) times daily. 01/13/24  Yes Debby Fidela CROME, NP  VENTOLIN  HFA 108 (90 Base) MCG/ACT inhaler Inhale 2 puffs into the lungs every 4  (four) hours as needed for shortness of breath or wheezing. 01/19/24  Yes [provider]    Allergies: Patient has no known allergies.    Review of Systems  Cardiovascular:  Positive for syncope.      09/22/2024    2:00 PM 09/22/2024    1:30 PM 09/22/2024    1:00 PM  Vitals with BMI  Systolic 131 116 881  Diastolic 57 54 56  Pulse 70 71 74    SpO2: 94 %   Physical Exam Vitals and nursing note reviewed.  Constitutional:      General: He is not in acute distress.    Appearance: Normal appearance.  HENT:     Head: Normocephalic and atraumatic.  Eyes:     Extraocular Movements: Extraocular movements intact.     Conjunctiva/sclera: Conjunctivae normal.     Pupils: Pupils are equal, round, and reactive to light.  Cardiovascular:     Rate and Rhythm: Normal rate and regular rhythm.     Pulses: Normal pulses.  Pulmonary:     Effort: Pulmonary effort is normal. No respiratory distress.     Breath sounds: Normal breath sounds.     Comments: Patient is able to speak in complete sentences without difficulty. Abdominal:     General: Abdomen is flat.     Palpations: Abdomen is soft.     Tenderness: There is no abdominal tenderness.  Musculoskeletal:        General: Normal range of motion.     Cervical back: Normal range of motion.  Skin:    General: Skin is warm and dry.     Capillary Refill: Capillary refill takes less than 2 seconds.  Neurological:     General: No focal deficit present.     Mental Status: He is alert. Mental status is at baseline.     Comments: CN III to XII intact No new sensory deficits, no new motor deficits, outside of left sided deficits from previous stroke 12/25  Psychiatric:        Mood and Affect: Mood normal.    (all labs ordered are listed, but only abnormal results are displayed) Labs Reviewed  RESP PANEL BY RT-PCR (RSV, FLU A&B, COVID)  RVPGX2 - Abnormal; Notable for the following components:      Result Value   SARS Coronavirus  2 by RT PCR POSITIVE (*)    All other components within normal limits  CBC WITH DIFFERENTIAL/PLATELET - Abnormal; Notable for the following components:   Monocytes Absolute 1.4 (*)    All other components within normal limits  COMPREHENSIVE METABOLIC PANEL WITH GFR - Abnormal; Notable for the following components:   Glucose, Bld 106 (*)    All other components within normal limits  CBG MONITORING, ED  CBG MONITORING, ED    EKG: EKG Interpretation Date/Time:  Thursday September 22 2024 09:49:17 EDT Ventricular Rate:  89 PR Interval:  160 QRS Duration:  84 QT Interval:  341 QTC  Calculation: 415 R Axis:   46  Text Interpretation: Sinus rhythm Low voltage, precordial leads Confirmed by Patsey Lot [phone]) on 09/22/2024 10:28:41 AM  Radiology: CT Head Wo Contrast Result Date: 09/22/2024 EXAM: CT HEAD WITHOUT CONTRAST 09/22/2024 12:59:27 PM TECHNIQUE: CT of the head was performed without the administration of intravenous contrast. Automated exposure control, iterative reconstruction, and/or weight based adjustment of the mA/kV was utilized to reduce the radiation dose to as low as reasonably achievable. COMPARISON: PET CT 01/06/2024 and MRI 12/01/2023. CLINICAL HISTORY: Syncope/presyncope, cerebrovascular cause suspected. FINDINGS: BRAIN AND VENTRICLES: There is no evidence of an acute infarct, intracranial hemorrhage, mass, midline shift, hydrocephalus, or extra-axial fluid collection. There has been further expected evolution of the large chronic right MCA infarct with progressive encephalomalacia and ex vacuo dilatation of the right lateral ventricle. Associated wallerian degeneration is noted in the brainstem. Patchy hypodensities elsewhere in the cerebral white matter bilaterally are similar to the prior CT and nonspecific but compatible with moderate chronic small vessel ischemic disease. Calcified atherosclerosis at the skull base. ORBITS: Chronic medial left orbital fracture.  SINUSES: No acute abnormality. SOFT TISSUES AND SKULL: No acute soft tissue abnormality. No skull fracture. IMPRESSION: 1. No acute intracranial abnormality. 2. Large chronic right MCA infarct. 3. Moderate chronic small vessel ischemic disease. Electronically signed by: Dasie Hamburg MD 09/22/2024 01:12 PM EDT RP Workstation: [id number]   DG Chest Port 1 View Result Date: 09/22/2024 CLINICAL DATA:  Shortness of breath. EXAM: PORTABLE CHEST 1 VIEW COMPARISON:  01/06/2024 FINDINGS: The cardiac silhouette, mediastinal and hilar contours are within normal limits and stable. The lungs are clear of an acute process. No infiltrates, edema or effusions. No pulmonary lesions or pneumothorax. The bony thorax is intact. IMPRESSION: No acute cardiopulmonary findings. Electronically Signed   By: MYRTIS Stammer M.D.   On: 09/22/2024 11:03     Procedures   Medications Ordered in the ED  albuterol  (PROVENTIL ) (2.5 MG/3ML) 0.083% nebulizer solution 2.5 mg (2.5 mg Nebulization Given 09/22/24 1130)  acetaminophen  (TYLENOL ) tablet 1,000 mg (1,000 mg Oral Given 09/22/24 1129)                                    Medical Decision Making Amount and/or Complexity of Data Reviewed Labs: ordered. Radiology: ordered.  Risk OTC drugs. Prescription drug management.   Patient presents to the ED for concern of syncopal episode, this involves an extensive number of treatment options, and is a complaint that carries with it a high risk of complications and morbidity.    The differential diagnosis includes: CVA/ICH Seizure Metabolic Vasovagal   Co morbidities that complicate the patient evaluation: Stoke Former smoker COPD PAD  Additional history obtained:  Additional history obtained from Family, Outside Medical Records, and Past Admission   External records from outside source obtained and reviewed including medical history, surgical history, allergies, medications.  The patient and his family were  reliable historians, providing a clear, detailed, and consistent account of the presenting symptoms and relevant medical history. The information was obtained directly from the patient and his family and statements were documented in the patient's own words when possible. No discrepancies were noted between the history provided and available collateral sources.     Lab Tests: I ordered, and personally interpreted labs.   The pertinent results include:  CMP WNL CBC slightly elevated monocytes CBG WNL Respiratory panel COVID-positive  Imaging Studies ordered: I ordered imaging studies  including: CT head WO contrast DG chest I independently visualized and interpreted imaging which showed: Chest x-ray showed no acute cardiopulmonary findings Head CT: 1. No acute intracranial abnormality. 2. Large chronic right MCA infarct. 3. Moderate chronic small vessel ischemic disease. I agree with the radiologist interpretation  Cardiac Monitoring: The patient was maintained on a cardiac monitor.  I personally viewed and interpreted the cardiac monitored which showed an underlying rhythm of: Sinus rhythm  Medicines ordered and prescription drug management: I ordered medications: Including albuterol  for home med management, COPD Tylenol  1000 mg 4 chronic left shoulder pain Reevaluation of the patient after these medicines showed that the patient improved I have reviewed the patients home medicines and have made adjustments as needed  Test Considered: Diagnostic testing was considered based on the patient's presenting symptoms, risk factors, and initial clinical assessment.  Given chronic shortness of breath due to patient's COPD oxygen levels notated in the ED today were 95%, patient was monitored ambulatory SpO2, given patient history, reported symptoms, and positive COVID respiratory panel, no history of recent immobilization, no hemoptysis, no malignancy treatment, and no clinical signs and  symptoms of a DVT/PE such as lower leg swelling or pain D-dimer was deemed not necessary at this visit as patient's symptoms are unlikely from a DVT/PE. The approach to diagnostic testing prioritized exclusion of life-threatening conditions   Diagnostic tools:  Clinical decision tools such as MDCalc were used in the emergency department to support diagnostic accuracy, risk stratification, and disposition planning.   Problem List / ED Course: Problem List: Syncope  Chronic pain  Chronic respiratory symptoms  Emergency Department Course: The patient presented with syncope. Initial assessment included history, physical exam, and review of prior medical records.  CMP, CBC were completed to rule out infectious and metabolic etiologies.  CBG was assessed for gross etiology.  Orthostatics were completed in the emergency department to rule out orthostatic hypotension as an etiology.  Imaging included CT head due to patient's history and rule out CVA/ICH etiology.  DG chest was completed to rule out infectious/respiratory etiology due to patient using his inhaler when the syncopal episode happened.  12-lead EKG was unremarkable.  Respiratory panel was positive for COVID.  While in the emergency department all vitals remained stable, patient was given albuterol  treatment for chronic COPD management and given acetaminophen  for chronic left shoulder pain.  Patient dilatory oxygen was assessed and did not drop below 92%, given patient's history of chronic COPD, patient was determined to be in a safe window for discharge. Given patient history, physical exam, laboratory, imaging patient is to be discharged with treatment for COVID supportive care measures at home.  Reevaluation: After the interventions noted above, I reevaluated the patient and found that they have :improved  Dispostion: After consideration of the diagnostic results and the patients response to treatment, I feel that the patent would benefit  from discharge home with COVID antivirals and home albuterol  treatment and follow-up with PCP. Clinical Assessment:    Working diagnosis: COVID Disposition Plan: The patient is medically stable for discharge from the Emergency Department at this time. Vital signs are within normal limits, and the patient is alert, oriented, and in no acute distress. Diagnostic evaluation has been completed with no findings necessitating hospital admission or further emergent workup.  Communication:   Patient and family informed of disposition decision and rationale. Questions addressed. The diagnostic results and clinical impression were discussed with the patient and  at bedside and the patient demonstrated understanding.  Prior to discharge, pharmacy was contacted for possible interactions of Paxlovid with patient's current medication regimen -supervised by pharmacy to let patient know to hold his statin medication while he is on the Paxlovid.  On discharge, patients pharmacy contacted stated that patient's antiplatelet has adverse reaction with the Paxlovid and to switch patient to Lagevrio which is a safe time viral with patient's current medication regimen. Verbal orders given to switch medications.      Final diagnoses:  Syncope and collapse  COVID    ED Discharge Orders          Ordered    nirmatrelvir/ritonavir (PAXLOVID, 300/100,) 20 x 150 MG & 10 x 100MG  TBPK  2 times daily        09/22/24 1440    albuterol  (PROVENTIL ) (2.5 MG/3ML) 0.083% nebulizer solution  Every 6 hours PRN        09/22/24 1440    For home use only DME Nebulizer machine  Status:  Canceled        09/22/24 1440    For home use only DME Nebulizer machine        09/22/24 1442               Willma Duwaine CROME, [place] 09/22/24 2147    Patsey Lot, MD 09/23/24 [phone]

## 2024-10-13 ENCOUNTER — Encounter: Payer: Self-pay | Admitting: Physical Medicine & Rehabilitation

## 2024-10-13 ENCOUNTER — Encounter: Attending: Physical Medicine & Rehabilitation | Admitting: Physical Medicine & Rehabilitation

## 2024-10-13 VITALS — BP 113/74 | HR 79 | Ht 70.0 in | Wt 202.0 lb

## 2024-10-13 DIAGNOSIS — M7502 Adhesive capsulitis of left shoulder: Secondary | ICD-10-CM | POA: Insufficient documentation

## 2024-10-13 DIAGNOSIS — G8114 Spastic hemiplegia affecting left nondominant side: Secondary | ICD-10-CM | POA: Diagnosis not present

## 2024-10-13 NOTE — Progress Notes (Signed)
 Subjective:    Patient ID: Ian Wong, male    DOB: 1961/05/01, 63 y.o.   MRN: [id number] Patient continues to have left upper extremity weakness also shoulder stiffness and pain.  He had some short-term relief with left shoulder injection He has completed both inpatient rehabilitation as well as outpatient rehabilitation  We discussed that from a stroke recovery standpoint he is at a plateau status.  He is 8 months post stroke.  We discussed that he should keep up with his home exercise program to minimize any loss of gains that he is made over the last several months.  HPI Discussed the use of AI scribe software for clinical note transcription with the patient, who gave verbal consent to proceed.  History of Present Illness Ian Wong is a 63 year old male with a history of stroke and frozen shoulder who presents with persistent shoulder pain.  He experiences persistent shoulder pain due to a frozen shoulder, which has not improved despite previous therapy. The pain remains consistent and affects his ability to perform daily activities such as picking things up and opening doors.  Approximately one year ago, he had a stroke resulting in weakness in his arm and a lack of sensation in his hand. He cannot feel sensations in his hand, making it difficult to use, and he is unable to raise his arm over his head.  He has not experienced any recent falls. He has been approved for disability benefits, which he received promptly before recent government shutdowns.     Pain Inventory Average Pain 6 Pain Right Now 5 My pain is intermittent and aching  In the last 24 hours, has pain interfered with the following? General activity 7 Relation with others 2 Enjoyment of life 2 What TIME of day is your pain at its worst? night Sleep (in general) Fair  Pain is worse with: walking and some activites Pain improves with: medication Relief from Meds: 6  Family History  Problem  Relation Age of Onset   COPD Mother    Other Brother    Social History   Socioeconomic History   Marital status: Married    Spouse name: Not on file   Number of children: Not on file   Years of education: Not on file   Highest education level: Not on file  Occupational History   Not on file  Tobacco Use   Smoking status: Former    Current packs/day: 0.00    Types: Cigarettes    Quit date: 12/2016    Years since quitting: 7.8   Smokeless tobacco: Never  Vaping Use   Vaping status: Never Used  Substance and Sexual Activity   Alcohol use: Not Currently    Comment: last drank etoh 1 1/2 months ago.   Drug use: No   Sexual activity: Not on file  Other Topics Concern   Not on file  Social History Narrative   Not on file   Social Drivers of Health   Financial Resource Strain: Not on file  Food Insecurity: Not on file  Transportation Needs: Not on file  Physical Activity: Not on file  Stress: Not on file  Social Connections: Not on file   Past Surgical History:  Procedure Laterality Date   AORTA - BILATERAL FEMORAL ARTERY BYPASS GRAFT Bilateral 01/07/2017   Procedure: AORTOBIFEMORAL BYPASS GRAFT;  Surgeon: Krystal JULIANNA Doing, MD;  [facility];  Service: Vascular;  Laterality: Bilateral;   I & D EXTREMITY Left  11/28/2014   Procedure: IRRIGATION AND DEBRIDEMENT;  REPAIR OF CRUSH INJURY;  Surgeon: Franky Curia, MD;  Location: [facility] OR;  Service: Orthopedics;  Laterality: Left;   INCISIONAL HERNIA REPAIR N/A 10/26/2017   Procedure: HERNIA REPAIR INCISIONAL WITH MESH;  Surgeon: Mavis Anes, MD;  Location: [facility] ORS;  Service: General;  Laterality: N/A;   IR CT HEAD LTD  11/30/2023   IR INTRAVSC STENT CERV CAROTID W/EMB-PROT MOD SED INCL ANGIO  11/30/2023   IR PERCUTANEOUS ART THROMBECTOMY/INFUSION INTRACRANIAL INC DIAG ANGIO  11/30/2023   IR RADIOLOGIST EVAL & MGMT  03/28/2024   IR US  GUIDE VASC ACCESS RIGHT  11/30/2023   RADIOLOGY WITH ANESTHESIA N/A 11/30/2023   Procedure: IR  WITH ANESTHESIA;  Surgeon: Radiologist, Medication, MD;  Location: [facility] OR;  Service: Radiology;  Laterality: N/A;   Past Surgical History:  Procedure Laterality Date   AORTA - BILATERAL FEMORAL ARTERY BYPASS GRAFT Bilateral 01/07/2017   Procedure: AORTOBIFEMORAL BYPASS GRAFT;  Surgeon: Krystal JULIANNA Doing, MD;  Location: [facility] OR;  Service: Vascular;  Laterality: Bilateral;   I & D EXTREMITY Left 11/28/2014   Procedure: IRRIGATION AND DEBRIDEMENT;  REPAIR OF CRUSH INJURY;  Surgeon: Franky Curia, MD;  Location: [facility] OR;  Service: Orthopedics;  Laterality: Left;   INCISIONAL HERNIA REPAIR N/A 10/26/2017   Procedure: HERNIA REPAIR INCISIONAL WITH MESH;  Surgeon: Mavis Anes, MD;  Location: [facility] ORS;  Service: General;  Laterality: N/A;   IR CT HEAD LTD  11/30/2023   IR INTRAVSC STENT CERV CAROTID W/EMB-PROT MOD SED INCL ANGIO  11/30/2023   IR PERCUTANEOUS ART THROMBECTOMY/INFUSION INTRACRANIAL INC DIAG ANGIO  11/30/2023   IR RADIOLOGIST EVAL & MGMT  03/28/2024   IR US  GUIDE VASC ACCESS RIGHT  11/30/2023   RADIOLOGY WITH ANESTHESIA N/A 11/30/2023   Procedure: IR WITH ANESTHESIA;  Surgeon: Radiologist, Medication, MD;  Location: [facility] OR;  Service: Radiology;  Laterality: N/A;   Past Medical History:  Diagnosis Date   GERD (gastroesophageal reflux disease)    Hypothyroidism    Seizures (HCC)    Ht 5' 10 (1.778 m)   Wt 202 lb (91.6 kg)   BMI 28.98 kg/m   Opioid Risk Score:   Fall Risk Score:  `1  Depression screen PHQ 2/9     10/13/2024    2:13 PM 05/17/2024    2:29 PM 01/13/2024    1:32 PM  Depression screen PHQ 2/9  Decreased Interest 1 1 0  Down, Depressed, Hopeless  1 0  PHQ - 2 Score 1 2 0  Altered sleeping   0  Tired, decreased energy   0  Change in appetite   0  Feeling bad or failure about yourself    0  Trouble concentrating   0  Moving slowly or fidgety/restless   0  Suicidal thoughts   0  PHQ-9 Score   0   Difficult doing work/chores   Not difficult at all     Data saved with a  previous flowsheet row definition    Review of Systems  Musculoskeletal:        Pain in the left shoulder  All other systems reviewed and are negative.      Objective:   Physical Exam Left shoulder limited to around 90 degrees of abduction and 20 degrees of external rotation pain at end range. 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 Previous therapy ineffective.  - Referred to Dr. Genelle for consultation regarding left shoulder manipulation under general anesthesia.  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 No significant decline since last visit. - Continue current management without resumption of therapy.

## 2024-11-05 ENCOUNTER — Other Ambulatory Visit: Payer: Self-pay | Admitting: Physical Medicine & Rehabilitation

## 2024-11-16 ENCOUNTER — Ambulatory Visit (HOSPITAL_BASED_OUTPATIENT_CLINIC_OR_DEPARTMENT_OTHER): Admitting: Orthopaedic Surgery

## 2024-12-04 ENCOUNTER — Other Ambulatory Visit: Payer: Self-pay | Admitting: Physical Medicine & Rehabilitation

## 2024-12-05 ENCOUNTER — Other Ambulatory Visit: Payer: Self-pay

## 2024-12-05 MED ORDER — GABAPENTIN 300 MG PO CAPS: 300.0000 mg | ORAL_CAPSULE | Freq: Two times a day (BID) | ORAL | 0 refills | Status: DC

## 2024-12-21 ENCOUNTER — Ambulatory Visit (INDEPENDENT_AMBULATORY_CARE_PROVIDER_SITE_OTHER): Admitting: Orthopaedic Surgery

## 2024-12-21 DIAGNOSIS — M7502 Adhesive capsulitis of left shoulder: Secondary | ICD-10-CM | POA: Diagnosis not present

## 2024-12-21 NOTE — Progress Notes (Signed)
 "   Chief Complaint: Left shoulder pain     History of Present Illness:    Ian Wong is a 64 y.o. 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  He has been making significant improvements in terms of his left lower extremity strength although unfortunately has been having quite a difficult time with his left arm.  His left shoulder is persistently painful with most activities including laying on the side.  He has limited send no overhead range of motion of the shoulder.  He is currently disabled as result of his stroke.  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 01/07/2017   Procedure: AORTOBIFEMORAL BYPASS GRAFT;  Surgeon: Krystal JULIANNA Doing, MD;  Location: [facility] OR;  Service: Vascular;  Laterality: Bilateral;   I & D EXTREMITY Left 11/28/2014   Procedure: IRRIGATION AND DEBRIDEMENT;  REPAIR OF CRUSH INJURY;  Surgeon: Franky Curia, MD;  Location: [facility] OR;  Service: Orthopedics;  Laterality: Left;   INCISIONAL HERNIA REPAIR N/A 10/26/2017   Procedure: HERNIA REPAIR INCISIONAL WITH MESH;  Surgeon: Mavis Anes, MD;  Location: [facility] ORS;  Service: General;  Laterality: N/A;   IR CT HEAD LTD  11/30/2023   IR INTRAVSC STENT CERV CAROTID W/EMB-PROT MOD SED INCL ANGIO  11/30/2023   IR PERCUTANEOUS ART THROMBECTOMY/INFUSION INTRACRANIAL INC DIAG ANGIO  11/30/2023   IR RADIOLOGIST EVAL & MGMT  03/28/2024   IR US  GUIDE VASC ACCESS RIGHT  11/30/2023   RADIOLOGY WITH ANESTHESIA N/A 11/30/2023   Procedure: IR WITH ANESTHESIA;  Surgeon: Radiologist, Medication, MD;  Location: [facility] OR;  Service: Radiology;  Laterality: N/A;   Social History   Socioeconomic History   Marital  status: Married    Spouse name: Not on file   Number of children: Not on file   Years of education: Not on file   Highest education level: Not on file  Occupational History   Not on file  Tobacco Use   Smoking status: Former    Current packs/day: 0.00    Types: Cigarettes    Quit date: 12/2016    Years since quitting: 8.0   Smokeless tobacco: Never  Vaping Use   Vaping status: Never Used  Substance and Sexual Activity   Alcohol use: Not Currently    Comment: last drank etoh 1 1/2 months ago.   Drug use: No   Sexual activity: Not on file  Other Topics Concern   Not on file  Social History Narrative   Not on file   Social Drivers of Health   Tobacco Use: Medium Risk (10/13/2024)   Patient History    Smoking Tobacco Use: Former    Smokeless Tobacco Use: Never    Passive Exposure: Not on Actuary Strain: Not on file  Food Insecurity: Not on file  Transportation Needs: Not on file  Physical Activity: Not on file  Stress: Not on file  Social Connections: Not on file  Depression (PHQ2-9): Low Risk (10/13/2024)   Depression (PHQ2-9)    PHQ-2 Score: 1  Alcohol Screen: Not on file  Housing: Not on file  Utilities: Not on file  Health Literacy: Not on file   Family  History  Problem Relation Age of Onset   COPD Mother    Other Brother    Allergies[1] Current Outpatient Medications  Medication Sig Dispense Refill   acetaminophen  (TYLENOL ) 325 MG tablet Take 2 tablets (650 mg total) by mouth every 4 (four) hours as needed for mild pain (pain score 1-3) (or temp > 37.5 C (99.5 F)).     albuterol  (PROVENTIL ) (2.5 MG/3ML) 0.083% nebulizer solution Take 3 mLs (2.5 mg total) by nebulization every 6 (six) hours as needed for wheezing or shortness of breath. 75 mL 12   aspirin  81 MG chewable tablet Chew 1 tablet (81 mg total) by mouth daily.     aspirin -sod bicarb-citric acid (ALKA-SELTZER) 325 MG TBEF tablet Take 325 mg by mouth every 6 (six) hours as needed  (cold).     atorvastatin  (LIPITOR) 20 MG tablet Take 1 tablet (20 mg total) by mouth every evening. 30 tablet 0   calcium  carbonate (TUMS - DOSED IN MG ELEMENTAL CALCIUM ) 500 MG chewable tablet Chew 1 tablet (200 mg of elemental calcium  total) by mouth 2 (two) times daily as needed for indigestion or heartburn.     escitalopram  (LEXAPRO ) 5 MG tablet Take 1 tablet (5 mg total) by mouth at bedtime. 30 tablet 0   esomeprazole  (NEXIUM ) 20 MG capsule Take 1 capsule (20 mg total) by mouth daily before breakfast. 30 capsule 0   gabapentin  (NEURONTIN ) 300 MG capsule Take 1 capsule (300 mg total) by mouth 2 (two) times daily. 60 capsule 0   levothyroxine  (SYNTHROID ) 50 MCG tablet Take 1 tablet (50 mcg total) by mouth daily. 30 tablet 0   nystatin cream (MYCOSTATIN) Apply 1 Application topically 2 (two) times daily.     Omega-3 Fatty Acids (OMEGA 3 PO) Take 1 g by mouth 2 (two) times daily.     ondansetron  (ZOFRAN -ODT) 4 MG disintegrating tablet Take 1 tablet (4 mg total) by mouth every 8 (eight) hours as needed for nausea or vomiting. 12 tablet 0   ticagrelor  (BRILINTA ) 90 MG TABS tablet Take 1 tablet (90 mg total) by mouth 2 (two) times daily. 60 tablet 0   VENTOLIN  HFA 108 (90 Base) MCG/ACT inhaler Inhale 2 puffs into the lungs every 4 (four) hours as needed for shortness of breath or wheezing.     No current facility-administered medications for this visit.   No results found.  Review of Systems:   A ROS was performed including pertinent positives and negatives as documented in the HPI.  Physical Exam :   Constitutional: NAD and appears stated age Neurological: Alert and oriented Psych: Appropriate affect and cooperative There were no vitals taken for this visit.   Comprehensive Musculoskeletal Exam:    Left shoulder with tenderness about the glenohumeral joint.  Passive forward flexion is to 80 degrees with pain external rotation at the side is to 10 degrees with pain internal rotation  deferred.  He does fire his axillary nerve with sensation intact in the axillary distribution   Imaging:   Xray (3 views left shoulder): Normal     I personally reviewed and interpreted the radiographs.   Assessment and Plan:   64 y.o. male with evidence of severe adhesive capsulitis following previous stroke.  This time his left shoulder has been extremely difficult in terms of mobility due to the adhesive capsulitis.  Overall I do believe he may be a candidate for arthroscopic debridement and lysis of adhesions with manipulation although I did discuss that I would like to  obtain an MRI to confirm the diagnosis of adhesive capsulitis and rule out any type of underlying tear   -Return to clinic following MRI left shoulder   I personally saw and evaluated the patient, and participated in the management and treatment plan.  Elspeth Parker, MD Attending Physician, Orthopedic Surgery  This document was dictated using Dragon voice recognition software. A reasonable attempt at proof reading has been made to minimize errors.    [1] No Known Allergies  "

## 2025-01-02 ENCOUNTER — Other Ambulatory Visit: Payer: Self-pay | Admitting: Physical Medicine & Rehabilitation

## 2025-01-05 ENCOUNTER — Ambulatory Visit (HOSPITAL_COMMUNITY): Admission: RE | Admit: 2025-01-05 | Discharge: 2025-01-05 | Attending: Orthopaedic Surgery

## 2025-01-05 DIAGNOSIS — M7502 Adhesive capsulitis of left shoulder: Secondary | ICD-10-CM

## 2025-01-25 ENCOUNTER — Ambulatory Visit (HOSPITAL_BASED_OUTPATIENT_CLINIC_OR_DEPARTMENT_OTHER): Admitting: Orthopaedic Surgery

## 2025-04-11 ENCOUNTER — Encounter: Attending: Physical Medicine & Rehabilitation | Admitting: Physical Medicine & Rehabilitation
